# Patient Record
Sex: Male | Born: 1960
Health system: Southern US, Community
[De-identification: ages and names within clinical notes are randomized; demographics above are authoritative.]

## PROBLEM LIST (undated history)

## (undated) DIAGNOSIS — M1990 Unspecified osteoarthritis, unspecified site: Secondary | ICD-10-CM

## (undated) DIAGNOSIS — N400 Enlarged prostate without lower urinary tract symptoms: Secondary | ICD-10-CM

## (undated) DIAGNOSIS — I10 Essential (primary) hypertension: Secondary | ICD-10-CM

## (undated) DIAGNOSIS — K589 Irritable bowel syndrome without diarrhea: Secondary | ICD-10-CM

## (undated) DIAGNOSIS — R7303 Prediabetes: Secondary | ICD-10-CM

## (undated) DIAGNOSIS — D649 Anemia, unspecified: Secondary | ICD-10-CM

## (undated) DIAGNOSIS — K219 Gastro-esophageal reflux disease without esophagitis: Secondary | ICD-10-CM

## (undated) DIAGNOSIS — I2510 Atherosclerotic heart disease of native coronary artery without angina pectoris: Secondary | ICD-10-CM

## (undated) DIAGNOSIS — L309 Dermatitis, unspecified: Secondary | ICD-10-CM

## (undated) DIAGNOSIS — E785 Hyperlipidemia, unspecified: Secondary | ICD-10-CM

## (undated) DIAGNOSIS — A048 Other specified bacterial intestinal infections: Secondary | ICD-10-CM

## (undated) DIAGNOSIS — G4730 Sleep apnea, unspecified: Secondary | ICD-10-CM

## (undated) DIAGNOSIS — I209 Angina pectoris, unspecified: Secondary | ICD-10-CM

## (undated) HISTORY — DX: Essential (primary) hypertension: I10

## (undated) HISTORY — DX: Dermatitis, unspecified: L30.9

## (undated) HISTORY — DX: Irritable bowel syndrome, unspecified: K58.9

## (undated) HISTORY — DX: Sleep apnea, unspecified: G47.30

## (undated) HISTORY — DX: Hyperlipidemia, unspecified: E78.5

## (undated) HISTORY — PX: WISDOM TOOTH EXTRACTION: SHX21

## (undated) HISTORY — DX: Benign prostatic hyperplasia without lower urinary tract symptoms: N40.0

## (undated) HISTORY — DX: Anemia, unspecified: D64.9

## (undated) HISTORY — DX: Other specified bacterial intestinal infections: A04.8

## (undated) HISTORY — DX: Gastro-esophageal reflux disease without esophagitis: K21.9

## (undated) HISTORY — PX: CARDIAC CATHETERIZATION: SHX172

---

## 1998-07-09 ENCOUNTER — Encounter: Payer: Self-pay | Admitting: Emergency Medicine

## 1998-07-09 ENCOUNTER — Emergency Department (HOSPITAL_COMMUNITY): Admission: EM | Admit: 1998-07-09 | Discharge: 1998-07-09 | Payer: Self-pay | Admitting: Emergency Medicine

## 2000-10-31 ENCOUNTER — Encounter: Payer: Self-pay | Admitting: Family Medicine

## 2000-10-31 ENCOUNTER — Ambulatory Visit (HOSPITAL_COMMUNITY): Admission: RE | Admit: 2000-10-31 | Discharge: 2000-10-31 | Payer: Self-pay | Admitting: Family Medicine

## 2001-05-29 ENCOUNTER — Encounter: Payer: Self-pay | Admitting: Internal Medicine

## 2001-05-29 ENCOUNTER — Ambulatory Visit (HOSPITAL_COMMUNITY): Admission: RE | Admit: 2001-05-29 | Discharge: 2001-05-29 | Payer: Self-pay | Admitting: Internal Medicine

## 2001-12-05 ENCOUNTER — Emergency Department (HOSPITAL_COMMUNITY): Admission: EM | Admit: 2001-12-05 | Discharge: 2001-12-05 | Payer: Self-pay | Admitting: *Deleted

## 2003-08-28 HISTORY — PX: CHOLECYSTECTOMY: SHX55

## 2005-01-23 ENCOUNTER — Emergency Department (HOSPITAL_COMMUNITY): Admission: EM | Admit: 2005-01-23 | Discharge: 2005-01-24 | Payer: Self-pay | Admitting: Emergency Medicine

## 2005-01-24 ENCOUNTER — Ambulatory Visit (HOSPITAL_COMMUNITY): Admission: RE | Admit: 2005-01-24 | Discharge: 2005-01-24 | Payer: Self-pay | Admitting: Emergency Medicine

## 2005-01-26 ENCOUNTER — Ambulatory Visit: Payer: Self-pay | Admitting: Nurse Practitioner

## 2005-01-29 ENCOUNTER — Ambulatory Visit: Payer: Self-pay | Admitting: *Deleted

## 2005-01-31 ENCOUNTER — Ambulatory Visit (HOSPITAL_COMMUNITY): Admission: RE | Admit: 2005-01-31 | Discharge: 2005-01-31 | Payer: Self-pay

## 2005-01-31 ENCOUNTER — Encounter (INDEPENDENT_AMBULATORY_CARE_PROVIDER_SITE_OTHER): Payer: Self-pay | Admitting: *Deleted

## 2005-02-12 ENCOUNTER — Ambulatory Visit: Payer: Self-pay | Admitting: Family Medicine

## 2005-02-12 ENCOUNTER — Ambulatory Visit: Payer: Self-pay | Admitting: *Deleted

## 2006-11-03 ENCOUNTER — Emergency Department (HOSPITAL_COMMUNITY): Admission: EM | Admit: 2006-11-03 | Discharge: 2006-11-03 | Payer: Self-pay | Admitting: Emergency Medicine

## 2006-11-08 ENCOUNTER — Ambulatory Visit: Payer: Self-pay | Admitting: Nurse Practitioner

## 2006-11-08 ENCOUNTER — Ambulatory Visit (HOSPITAL_COMMUNITY): Admission: RE | Admit: 2006-11-08 | Discharge: 2006-11-08 | Payer: Self-pay | Admitting: Nurse Practitioner

## 2006-11-12 ENCOUNTER — Ambulatory Visit: Payer: Self-pay | Admitting: Nurse Practitioner

## 2006-11-19 ENCOUNTER — Encounter: Admission: RE | Admit: 2006-11-19 | Discharge: 2006-11-19 | Payer: Self-pay | Admitting: Orthopaedic Surgery

## 2007-05-14 ENCOUNTER — Encounter (INDEPENDENT_AMBULATORY_CARE_PROVIDER_SITE_OTHER): Payer: Self-pay | Admitting: *Deleted

## 2010-04-05 ENCOUNTER — Ambulatory Visit: Payer: Self-pay | Admitting: Internal Medicine

## 2010-04-05 LAB — CONVERTED CEMR LAB
BUN: 20 mg/dL (ref 6–23)
CO2: 23 meq/L (ref 19–32)
Calcium: 9.7 mg/dL (ref 8.4–10.5)
Chloride: 103 meq/L (ref 96–112)
Cholesterol: 179 mg/dL (ref 0–200)
Creatinine, Ser: 1.03 mg/dL (ref 0.40–1.50)
Glucose, Bld: 88 mg/dL (ref 70–99)
Total Bilirubin: 0.5 mg/dL (ref 0.3–1.2)
Total CHOL/HDL Ratio: 4.8
Triglycerides: 72 mg/dL (ref <150)
VLDL: 14 mg/dL (ref 0–40)

## 2010-07-01 ENCOUNTER — Emergency Department (HOSPITAL_COMMUNITY): Admission: EM | Admit: 2010-07-01 | Discharge: 2010-07-01 | Payer: Self-pay | Admitting: Family Medicine

## 2010-07-26 ENCOUNTER — Encounter (INDEPENDENT_AMBULATORY_CARE_PROVIDER_SITE_OTHER): Payer: Self-pay | Admitting: *Deleted

## 2010-08-27 HISTORY — PX: COLONOSCOPY: SHX174

## 2010-09-01 LAB — HM COLONOSCOPY

## 2010-09-05 ENCOUNTER — Encounter (INDEPENDENT_AMBULATORY_CARE_PROVIDER_SITE_OTHER): Payer: Self-pay | Admitting: *Deleted

## 2010-09-05 ENCOUNTER — Other Ambulatory Visit: Payer: Self-pay | Admitting: Gastroenterology

## 2010-09-05 ENCOUNTER — Ambulatory Visit
Admission: RE | Admit: 2010-09-05 | Discharge: 2010-09-05 | Payer: Self-pay | Source: Home / Self Care | Attending: Gastroenterology | Admitting: Gastroenterology

## 2010-09-05 DIAGNOSIS — K3189 Other diseases of stomach and duodenum: Secondary | ICD-10-CM | POA: Insufficient documentation

## 2010-09-05 DIAGNOSIS — R1013 Epigastric pain: Secondary | ICD-10-CM

## 2010-09-05 DIAGNOSIS — R197 Diarrhea, unspecified: Secondary | ICD-10-CM | POA: Insufficient documentation

## 2010-09-05 LAB — COMPREHENSIVE METABOLIC PANEL
ALT: 45 U/L (ref 0–53)
AST: 32 U/L (ref 0–37)
Albumin: 4 g/dL (ref 3.5–5.2)
Alkaline Phosphatase: 68 U/L (ref 39–117)
BUN: 16 mg/dL (ref 6–23)
CO2: 31 mEq/L (ref 19–32)
Calcium: 9.9 mg/dL (ref 8.4–10.5)
Chloride: 104 mEq/L (ref 96–112)
Creatinine, Ser: 1 mg/dL (ref 0.4–1.5)
GFR: 86.06 mL/min (ref >60.00)
Glucose, Bld: 69 mg/dL — ABNORMAL LOW (ref 70–99)
Potassium: 4.5 mEq/L (ref 3.5–5.1)
Sodium: 140 mEq/L (ref 135–145)
Total Bilirubin: 0.6 mg/dL (ref 0.3–1.2)
Total Protein: 8.1 g/dL (ref 6.0–8.3)

## 2010-09-05 LAB — CBC WITH DIFFERENTIAL/PLATELET
Basophils Absolute: 0 10*3/uL (ref 0.0–0.1)
Basophils Relative: 0.6 % (ref 0.0–3.0)
Eosinophils Absolute: 0.3 10*3/uL (ref 0.0–0.7)
Eosinophils Relative: 4.1 % (ref 0.0–5.0)
HCT: 43.2 % (ref 39.0–52.0)
Hemoglobin: 14.4 g/dL (ref 13.0–17.0)
Lymphocytes Relative: 44.9 % (ref 12.0–46.0)
Lymphs Abs: 2.8 10*3/uL (ref 0.7–4.0)
MCHC: 33.5 g/dL (ref 30.0–36.0)
MCV: 84.7 fl (ref 78.0–100.0)
Monocytes Absolute: 0.7 10*3/uL (ref 0.1–1.0)
Monocytes Relative: 11.8 % (ref 3.0–12.0)
Neutro Abs: 2.4 10*3/uL (ref 1.4–7.7)
Neutrophils Relative %: 38.6 % — ABNORMAL LOW (ref 43.0–77.0)
Platelets: 157 10*3/uL (ref 150.0–400.0)
RBC: 5.1 Mil/uL (ref 4.22–5.81)
RDW: 15 % — ABNORMAL HIGH (ref 11.5–14.6)
WBC: 6.3 10*3/uL (ref 4.5–10.5)

## 2010-09-05 LAB — SEDIMENTATION RATE: Sed Rate: 16 mm/hr (ref 0–22)

## 2010-09-06 ENCOUNTER — Encounter: Payer: Self-pay | Admitting: Gastroenterology

## 2010-09-07 ENCOUNTER — Encounter: Payer: Self-pay | Admitting: Gastroenterology

## 2010-09-11 ENCOUNTER — Other Ambulatory Visit: Payer: Self-pay | Admitting: Gastroenterology

## 2010-09-11 ENCOUNTER — Ambulatory Visit
Admission: RE | Admit: 2010-09-11 | Discharge: 2010-09-11 | Payer: Self-pay | Source: Home / Self Care | Attending: Gastroenterology | Admitting: Gastroenterology

## 2010-09-11 ENCOUNTER — Encounter: Payer: Self-pay | Admitting: Gastroenterology

## 2010-09-17 ENCOUNTER — Encounter: Payer: Self-pay | Admitting: Orthopaedic Surgery

## 2010-09-26 NOTE — Letter (Signed)
Summary: New Patient letter  [facility]  [address] [place], [place] [address]   Phone: [phone]  Fax: [phone]       07/26/2010 MRN: [id number]  [facility] [address] [place], [place]  [address]  Dear Eugene Mccullough,  Welcome to the Gastroenterology Division at [facility]    You are scheduled to see Dr. Christella Hartigan on 09/05/2010 at 3:00PM on the 3rd floor at [facility], [address]  We ask that you try to arrive at our office 15 minutes prior to your appointment time to allow for check-in.  We would like you to complete the enclosed self-administered evaluation form prior to your visit and bring it with you on the day of your appointment.  We will review it with you.  Also, please bring a complete list of all your medications or, if you prefer, bring the medication bottles and we will list them.  Please bring your insurance card so that we may make a copy of it.  If your insurance requires a referral to see a specialist, please bring your referral form from your primary care physician.  Co-payments are due at the time of your visit and may be paid by cash, check or credit card.     Your office visit will consist of a consult with your physician (includes a physical exam), any laboratory testing he/she may order, scheduling of any necessary diagnostic testing (e.g. x-ray, ultrasound, CT-scan), and scheduling of a procedure (e.g. Endoscopy, Colonoscopy) if required.  Please allow enough time on your schedule to allow for any/all of these possibilities.    If you cannot keep your appointment, please call [phone] to cancel or reschedule prior to your appointment date.  This allows [place] the opportunity to schedule an appointment for another patient in need of care.  If you do not cancel or reschedule by 5 p.m. the business day prior to your appointment date, you will be charged a $50.00 late cancellation/no-show fee.    Thank you for  choosing [facility] for your medical needs.  We appreciate the opportunity to care for you.  Please visit [place] at our website  to learn more about our practice.                     Sincerely,                                                             The Gastroenterology Division

## 2010-09-28 NOTE — Miscellaneous (Signed)
Summary: rx  Clinical Lists Changes  Medications: Removed medication of MOVIPREP 100 GM  SOLR (PEG-KCL-NACL-NASULF-NA ASC-C) As per prep instructions. Added new medication of METRONIDAZOLE 250 MG  TABS (METRONIDAZOLE) take one pill three times a day - Signed Rx of METRONIDAZOLE 250 MG  TABS (METRONIDAZOLE) take one pill three times a day;  #21 x 0;  Signed;  Entered by: Rachael Fee MD;  Authorized by: Rachael Fee MD;  Method used: Print then Give to Patient    Prescriptions: METRONIDAZOLE 250 MG  TABS (METRONIDAZOLE) take one pill three times a day  #21 x 0   Entered and Authorized by:   Rachael Fee MD   Signed by:   Rachael Fee MD on 09/11/2010   Method used:   Print then Give to Patient   RxID:   [id number]

## 2010-09-28 NOTE — Letter (Signed)
Summary: [facility] Instructions  [facility]  [address] [place], [place] [address]   Phone: [phone]  Fax: [phone]       Eugene Mccullough    1974/07/31    MRN: [id number]        Procedure Day /Date:09/11/10 MON     Arrival Time:1 pm     Procedure Time:2 pm     Location of Procedure:                    X  [facility] (4th Floor)                        PREPARATION FOR COLONOSCOPY WITH MOVIPREP   Starting 5 days prior to your procedure 09/06/10 do not eat nuts, seeds, popcorn, corn, beans, peas,  salads, or any raw vegetables.  Do not take any fiber supplements (e.g. Metamucil, Citrucel, and Benefiber).  THE DAY BEFORE YOUR PROCEDURE         DATE: 09/10/10 DAY: SUN  1.  Drink clear liquids the entire day-NO SOLID FOOD  2.  Do not drink anything colored red or purple.  Avoid juices with pulp.  No orange juice.  3.  Drink at least 64 oz. (8 glasses) of fluid/clear liquids during the day to prevent dehydration and help the prep work efficiently.  CLEAR LIQUIDS INCLUDE: Water Jello Ice Popsicles Tea (sugar ok, no milk/cream) Powdered fruit flavored drinks Coffee (sugar ok, no milk/cream) Gatorade Juice: apple, white grape, white cranberry  Lemonade Clear bullion, consomm, broth Carbonated beverages (any kind) Strained chicken noodle soup Hard Candy                             4.  In the morning, mix first dose of MoviPrep solution:    Empty 1 Pouch A and 1 Pouch B into the disposable container    Add lukewarm drinking water to the top line of the container. Mix to dissolve    Refrigerate (mixed solution should be used within 24 hrs)  5.  Begin drinking the prep at 5:00 p.m. The MoviPrep container is divided by 4 marks.   Every 15 minutes drink the solution down to the next mark (approximately 8 oz) until the full liter is complete.   6.  Follow completed prep with 16 oz of clear liquid of your choice (Nothing red or purple).   Continue to drink clear liquids until bedtime.  7.  Before going to bed, mix second dose of MoviPrep solution:    Empty 1 Pouch A and 1 Pouch B into the disposable container    Add lukewarm drinking water to the top line of the container. Mix to dissolve    Refrigerate  THE DAY OF YOUR PROCEDURE      DATE: 09/11/10 DAY: MON  Beginning at 9 a.m. (5 hours before procedure):         1. Every 15 minutes, drink the solution down to the next mark (approx 8 oz) until the full liter is complete.  2. Follow completed prep with 16 oz. of clear liquid of your choice.    3. You may drink clear liquids until 12 noon (2 HOURS BEFORE PROCEDURE).   MEDICATION INSTRUCTIONS  Unless otherwise instructed, you should take regular prescription medications with a small sip of water   as early as possible the morning of your procedure.  OTHER INSTRUCTIONS  You will need a responsible adult at least 50 years of age to accompany you and drive you home.   This person must remain in the waiting room during your procedure.  Wear loose fitting clothing that is easily removed.  Leave jewelry and other valuables at home.  However, you may wish to bring a book to read or  an iPod/MP3 player to listen to music as you wait for your procedure to start.  Remove all body piercing jewelry and leave at home.  Total time from sign-in until discharge is approximately 2-3 hours.  You should go home directly after your procedure and rest.  You can resume normal activities the  day after your procedure.  The day of your procedure you should not:   Drive   Make legal decisions   Operate machinery   Drink alcohol   Return to work  You will receive specific instructions about eating, activities and medications before you leave.    The above instructions have been reviewed and explained to me by   _______________________    I fully understand and can verbalize these instructions  _____________________________ Date _________

## 2010-09-28 NOTE — Procedures (Addendum)
Summary: Upper Endoscopy  Patient: Eugene Mccullough Note: All result statuses are Final unless otherwise noted.  Tests: (1) Upper Endoscopy (EGD)   EGD Upper Endoscopy       DONE     [facility]     [address]     [place], [place]  [address]           ENDOSCOPY PROCEDURE REPORT           PATIENT:  [name], [name]  MR#:  [id number]     BIRTHDATE:  11-16-1960, 49 yrs. old  GENDER:  male     ENDOSCOPIST:  Rachael Fee, MD     PROCEDURE DATE:  09/11/2010     PROCEDURE:  EGD with biopsy, [address]     ASA CLASS:  Class II     INDICATIONS:  abdominal pain, previous PUD     MEDICATIONS:  There was residual sedation effect present from     prior procedure.     TOPICAL ANESTHETIC:  none           DESCRIPTION OF PROCEDURE:   After the risks benefits and     alternatives of the procedure were thoroughly explained, informed     consent was obtained.  The LB GIF-H180 [id number] endoscope was     introduced through the mouth and advanced to the second portion of     the duodenum, without limitations.  The instrument was slowly     withdrawn as the mucosa was fully examined.     <<PROCEDUREIMAGES>>     There was mild, non-specific gastritis. Biopsies taken (pathology     jar 1) (see image2).  A hiatal hernia was found. This was 3-4cm     (see image5).  Otherwise the examination was normal (see image4,     image3, and image1).    Retroflexed views revealed no     abnormalities.    The scope was then withdrawn from the patient     and the procedure completed.     COMPLICATIONS:  None           ENDOSCOPIC IMPRESSION:     1) Mild gastritis, biopsied to check for H. pylori     2) Hiatal hernia     3) Otherwise normal examination           RECOMMENDATIONS:     Await biopsy results.  If biopsies confirm H. pylori he will be     started on appropriate antibiotics.           ______________________________     Rachael Fee, MD           n.     eSIGNED:   Rachael Fee at  09/11/2010 02:27 PM           Jake Seats, [id number]  Note: An exclamation mark (!) indicates a result that was not dispersed into the flowsheet. Document Creation Date: 09/11/2010 2:28 PM _______________________________________________________________________  (1) Order result status: Final Collection or observation date-time: 09/11/2010 14:21 Requested date-time:  Receipt date-time:  Reported date-time:  Referring Physician:   Ordering Physician: Rob Bunting [phone]) Specimen Source:  Source: Launa Grill Order Number: [id number] Lab site:

## 2010-09-28 NOTE — Assessment & Plan Note (Signed)
History of Present Illness Visit Type: Initial Visit Primary GI MD: Rob Bunting MD Primary Provider: [facility] Chief Complaint: Epigastric Pain and GERD History of Present Illness:     very pleasant 50 year old man who was born in [place].  he believes he has another ulcer.  he thinks he has IBS.   He had ulcers in UGI tract 5-6 years ago (he had pain in epigasgtrium, radiating to back).  EGD in [place] found ulcers (gastric and duodenum), was told it was H.pylori.  Was put on repeat Abx 3 months ago.   he still has pain in epigstrium, improved after he eats.  No NSAIDs.  He has gained 20 pounds in past year.  He has lower abd pain, diarrhea (6-7 times a day).  Non-bloody.  Was told he had IBS in [place] 25 years ago.   No nocturnal diarrhea.    Normally he has 2 bms a day, solid.    He has not had stool tested.           Current Medications (verified): 1)  Allopurinol 300 Mg Tabs (Allopurinol) .Marland Kitchen.. 1 By Mouth Once Daily 2)  Omeprazole 40 Mg Cpdr (Omeprazole) .Marland Kitchen.. 1 By Mouth Once Daily 3)  Tranquilizer/anticholinergic .Marland Kitchen.. 1 Three Times A Day  Allergies (verified): 1)  ! Vicodin  Past History:  Past Medical History: Gastric Ulcer  H pylori  GERD  Irritable Bowel Syndrome    Past Surgical History: Cholecystectomy 2005  Family History: no colon cancer  Social History: he is married, he has 3 children, he is a Insurance claims handler, he does not smoke cigarettes or drink alcohol, he drinks 2-4 caffeinated beverages a day  Review of Systems       Pertinent positive and negative review of systems were noted in the above HPI and GI specific review of systems.  All other review of systems was otherwise negative.   Vital Signs:  Patient profile:   50 year old male Height:      66 inches Weight:      211 pounds BMI:     34.18 BSA:     2.05 Pulse rate:   80 / minute Pulse rhythm:   regular BP sitting:   102 / 80  (right arm)  Vitals Entered By: Merri Ray  CMA Duncan Dull) (September 05, 2010 2:31 PM)  Physical Exam  Additional Exam:  Constitutional: generally well appearing Psychiatric: alert and oriented times 3 Eyes: extraocular movements intact Mouth: oropharynx moist, no lesions Neck: supple, no lymphadenopathy Cardiovascular: heart regular rate and rythm Lungs: CTA bilaterally Abdomen: soft, non-tender, non-distended, no obvious ascites, no peritoneal signs, normal bowel sounds Extremities: no lower extremity edema bilaterally Skin: no lesions on visible extremities    Impression & Recommendations:  Problem # 1:  History of ulcers, continued epigastric discomfort he is pretty clear that he has had H. pylori ulcers in the past. He has the same symptoms again. empiric antibiotics have not helped.  we will proceed with endoscopy with biopsies if indicated.  Problem # 2:  chronic diarrhea we will proceed with colonoscopy at his soonest convenience, we'll plan to look in his terminal ileum and also do random biopsies of his colon if everything looks normal.  he will get a basic set of labs including a CBC, complete metabolic profile, stool testing.  Other Orders: Colon/Endo (Colon/Endo) TLB-CBC Platelet - w/Differential (85025-CBCD) TLB-CMP (Comprehensive Metabolic Pnl) (80053-COMP) TLB-Sedimentation Rate (ESR) (85652-ESR) T-C diff by PCR (09811) T-Fecal WBC (91478-29562) T-Stool  for O&P (62130-86578)  Patient Instructions: 1)  You will be scheduled to have an upper endoscopy. 2)  You will be scheduled to have a colonoscopy. 3)  You will get lab test(s) done today (cbc, cmet, esr, stool for c. diff by PCR, fecal leuks, ova and parasites). 4)  A copy of this information will be sent to Dr. Andrey Campanile. 5)  The medication list was reviewed and reconciled.  All changed / newly prescribed medications were explained.  A complete medication list was provided to the patient / caregiver. Prescriptions: MOVIPREP 100 GM  SOLR (PEG-KCL-NACL-NASULF-NA  ASC-C) As per prep instructions.  #1 x 0   Entered by:   Chales Abrahams CMA (AAMA)   Authorized by:   Rachael Fee MD   Signed by:   Rachael Fee MD on 09/05/2010   Method used:   Electronically to        [facility]  [id number] (retail)       [address]       [place], [place]  [address]       Ph: [phone] or [phone]       Fax: [phone]   RxID:   [id number]

## 2010-09-28 NOTE — Procedures (Addendum)
Summary: Colonoscopy  Patient: [name] Note: All result statuses are Final unless otherwise noted.  Tests: (1) Colonoscopy (COL)   COL Colonoscopy           DONE     [facility]     [address]     [place], [place]  [address]           COLONOSCOPY PROCEDURE REPORT           PATIENT:  Eugene, Mccullough  MR#:  [id number]     BIRTHDATE:  11-28-1960, 49 yrs. old  GENDER:  male     ENDOSCOPIST:  Rachael Fee, MD     PROCEDURE DATE:  09/11/2010     PROCEDURE:  Colonoscopy with biopsy     ASA CLASS:  Class II     INDICATIONS:  chronic loose stools     MEDICATIONS:   Fentanyl 100 mcg IV, Versed 10 mg IV           DESCRIPTION OF PROCEDURE:   After the risks benefits and     alternatives of the procedure were thoroughly explained, informed     consent was obtained.  Digital rectal exam was performed and     revealed no rectal masses.   The LB [id number]-Q180AL [id number] endoscope     was introduced through the anus and advanced to the terminal ileum     which was intubated for a short distance, without limitations.     The quality of the prep was adequate, using MoviPrep.  The     instrument was then slowly withdrawn as the colon was fully     examined.     <<PROCEDUREIMAGES>>         FINDINGS:  There were significant     diverticular changes in left colon (multiple diverticulum, the     mucosa in this region was edematous, very tortuous) (see image7     and image6).  The terminal ileum appeared normal (see image4).     External hemorrhoids were found.  This was otherwise a normal     examination of the colon that was biopsied randomly (pathology jar     1) (see image3 and image8).   Retroflexed views in the rectum     revealed no abnormalities.    The scope was then withdrawn from     the patient and the procedur     e completed.     COMPLICATIONS:  None     ENDOSCOPIC IMPRESSION:     1) Significant left sided diverticular changes (edematouos,     tortuous lumen might  contribute to loose stools)     2) Normal terminal ileum     3) External hemorrhoids     4) Otherwise normal examination; random colon biopsies taken to     check for microscopic colitis.           RECOMMENDATIONS:     His ova/parasites showed an atypical parasite (endolimax nana)     that MAY be pathogenic. He will start flagyl 250, three times     daily for one week.     Await biopsies for further recommendations.           ______________________________     Rachael Fee, MD           n.     eSIGNED:   Rachael Fee at 09/11/2010 02:17 PM  Eugene Mccullough, Eugene Mccullough, [id number]  Note: An exclamation mark (!) indicates a result that was not dispersed into the flowsheet. Document Creation Date: 09/11/2010 2:18 PM _______________________________________________________________________  (1) Order result status: Final Collection or observation date-time: 09/11/2010 14:11 Requested date-time:  Receipt date-time:  Reported date-time:  Referring Physician:   Ordering Physician: Rob Bunting [phone]) Specimen Source:  Source: Launa Grill Order Number: [id number] Lab site:

## 2010-10-24 ENCOUNTER — Encounter (INDEPENDENT_AMBULATORY_CARE_PROVIDER_SITE_OTHER): Payer: Self-pay | Admitting: *Deleted

## 2010-11-02 NOTE — Assessment & Plan Note (Signed)
Summary: Patient responding to drug study advertisement.  Patient called [employer] regarding advertisement that he saw for [place] IBS-D study.  I reviewed his colonoscopy/pathology reports and spoke with Dr. Christella Hartigan, as patient continues to have frequent diarrhea.  Dr. Christella Hartigan does not recommend him for an IBS Study due to structural abnomality but feels that patient should return for follow-up.  I called patient back and explained this to him.  He will follow up with Dr. Christella Hartigan.   Allergies: 1)  ! Vicodin

## 2010-11-07 LAB — POCT I-STAT, CHEM 8
BUN: 13 mg/dL (ref 6–23)
Creatinine, Ser: 1.3 mg/dL (ref 0.4–1.5)
Glucose, Bld: 82 mg/dL (ref 70–99)
Hemoglobin: 16 g/dL (ref 13.0–17.0)
Potassium: 4.3 mEq/L (ref 3.5–5.1)
Sodium: 141 mEq/L (ref 135–145)

## 2010-11-07 LAB — POCT URINALYSIS DIPSTICK
Ketones, ur: NEGATIVE mg/dL
Protein, ur: 100 mg/dL — AB
Specific Gravity, Urine: 1.025 (ref 1.005–1.030)
Urobilinogen, UA: 0.2 mg/dL (ref 0.0–1.0)

## 2010-11-28 ENCOUNTER — Ambulatory Visit: Payer: Self-pay | Admitting: Gastroenterology

## 2011-01-12 NOTE — Op Note (Signed)
Eugene Mccullough, Eugene Mccullough             ACCOUNT NO.:  [id number]   MEDICAL RECORD NO.:  [id number]          PATIENT TYPE:  AMB   LOCATION:  DAY                          FACILITY:  [facility]   PHYSICIAN:  Lorre Munroe., M.D.DATE OF BIRTH:  [birth year]   DATE OF PROCEDURE:  01/31/2005  DATE OF DISCHARGE:  01/31/2005                                 OPERATIVE REPORT   PREOPERATIVE DIAGNOSES:  Symptomatic gallstones.   POSTOPERATIVE DIAGNOSES:  Symptomatic gallstones.   OPERATION:  Laparoscopic cholecystectomy.   SURGEON:  Lebron Conners, M.D.   ANESTHESIA:  General and local.   SPECIMEN:  Gallbladder.   ESTIMATED BLOOD LOSS:  Minimal.   COMPLICATIONS:  None.   DESCRIPTION OF PROCEDURE:  After the patient was monitored and anesthetized  and had routine preparation and draping of the abdomen, I infiltrated local  anesthetic at four sites, beginning just below the umbilicus. I made a short  transverse incision there, then opened the fascia in the midline and bluntly  entered the peritoneum. After placing the #0 Vicryl pursestring suture in  the fascia and securing a Hassan cannula, I  inflated the abdomen with CO2  and performed laparoscopy. No abnormalities were noted except for slight  chronic inflammation of the gallbladder. I placed the three additional ports  under direct view without injury to the viscera occurring. I then retracted  the fundus of the gallbladder upward and the infundibulum laterally and  dissected the hepatoduodenal ligament until I clearly identified the cystic  duct emerging from the infundibulum of the gallbladder. I clipped it with  four clips and divided between the two closest to the gallbladder. I  similarly dissected out and divided the cystic artery then dissected the  gallbladder out of its fossa utilizing the cautery. After obtaining good  hemostasis, I removed the gallbladder from the body through the umbilical  incision and tied the pursestring  suture. I then irrigated the right upper  quadrant and removed the irrigant. There was no evidence of bile leak or  bleeding and the clips appeared secure. I then removed the irrigant and the  lateral ports under direct vision and allowed the CO2 to escape through the  epigastric port and then removed it. I closed all skin incisions with  intracuticular 4-0 Vicryl and Steri-Strips. The patient tolerated the  operation well.      WB/MEDQ  D:  02/11/2005  T:  02/12/2005  Job:  [id number]

## 2011-03-15 ENCOUNTER — Inpatient Hospital Stay (INDEPENDENT_AMBULATORY_CARE_PROVIDER_SITE_OTHER)
Admission: RE | Admit: 2011-03-15 | Discharge: 2011-03-15 | Disposition: A | Discharge status: Home / Self Care | Payer: Self-pay | Source: Ambulatory Visit | Attending: Emergency Medicine | Admitting: Emergency Medicine

## 2011-03-15 DIAGNOSIS — M6281 Muscle weakness (generalized): Secondary | ICD-10-CM

## 2011-03-15 DIAGNOSIS — R5381 Other malaise: Secondary | ICD-10-CM

## 2011-03-15 LAB — POCT I-STAT, CHEM 8
Calcium, Ion: 1.24 mmol/L (ref 1.12–1.32)
Chloride: 104 mEq/L (ref 96–112)
Creatinine, Ser: 1.2 mg/dL (ref 0.50–1.35)
Glucose, Bld: 93 mg/dL (ref 70–99)
HCT: 44 % (ref 39.0–52.0)
Potassium: 4.1 mEq/L (ref 3.5–5.1)

## 2011-07-28 ENCOUNTER — Emergency Department (HOSPITAL_COMMUNITY)
Admission: EM | Admit: 2011-07-28 | Discharge: 2011-07-28 | Disposition: A | Discharge status: Nursing Home (Includes ICF) | Payer: Self-pay | Source: Home / Self Care | Attending: Emergency Medicine | Admitting: Emergency Medicine

## 2011-07-28 ENCOUNTER — Encounter (HOSPITAL_COMMUNITY): Payer: Self-pay | Admitting: *Deleted

## 2011-07-28 ENCOUNTER — Emergency Department (HOSPITAL_COMMUNITY)
Admission: EM | Admit: 2011-07-28 | Discharge: 2011-07-28 | Disposition: A | Discharge status: Home / Self Care | Payer: Self-pay | Attending: Emergency Medicine | Admitting: Emergency Medicine

## 2011-07-28 ENCOUNTER — Other Ambulatory Visit: Payer: Self-pay

## 2011-07-28 DIAGNOSIS — K589 Irritable bowel syndrome without diarrhea: Secondary | ICD-10-CM | POA: Insufficient documentation

## 2011-07-28 DIAGNOSIS — Z862 Personal history of diseases of the blood and blood-forming organs and certain disorders involving the immune mechanism: Secondary | ICD-10-CM | POA: Insufficient documentation

## 2011-07-28 DIAGNOSIS — R002 Palpitations: Secondary | ICD-10-CM

## 2011-07-28 DIAGNOSIS — Z79899 Other long term (current) drug therapy: Secondary | ICD-10-CM | POA: Insufficient documentation

## 2011-07-28 DIAGNOSIS — R030 Elevated blood-pressure reading, without diagnosis of hypertension: Secondary | ICD-10-CM | POA: Insufficient documentation

## 2011-07-28 DIAGNOSIS — Z8639 Personal history of other endocrine, nutritional and metabolic disease: Secondary | ICD-10-CM | POA: Insufficient documentation

## 2011-07-28 DIAGNOSIS — K219 Gastro-esophageal reflux disease without esophagitis: Secondary | ICD-10-CM | POA: Insufficient documentation

## 2011-07-28 DIAGNOSIS — I10 Essential (primary) hypertension: Secondary | ICD-10-CM

## 2011-07-28 LAB — POCT I-STAT, CHEM 8
BUN: 16 mg/dL (ref 6–23)
Calcium, Ion: 1.24 mmol/L (ref 1.12–1.32)
Chloride: 104 mEq/L (ref 96–112)
Creatinine, Ser: 1.1 mg/dL (ref 0.50–1.35)
Glucose, Bld: 74 mg/dL (ref 70–99)
Potassium: 4 mEq/L (ref 3.5–5.1)

## 2011-07-28 MED ORDER — HYDROCHLOROTHIAZIDE 25 MG PO TABS: 25.0000 mg | ORAL_TABLET | Freq: Every day | ORAL | Status: DC

## 2011-07-28 NOTE — ED Provider Notes (Signed)
History     CSN: [id number] Arrival date & time: 07/28/2011  9:25 AM   First MD Initiated Contact with Patient 07/28/11 0920      Chief Complaint  Patient presents with  . Tachycardia    per pt felt heart racing waking during night onset x 2 - 3 weeks - happening more often at night and during day - monitoring pulse and bp at home has documented starting on 11/15 - pt has appt [facility] - denies cp     HPI Comments: Pt with 2 weeks of intermittent episodes of palpitations lasting approx 30 min and resolving. States over past week is having nonradiating chest discomfort described as "pressure" with episodes. Occur with exertion and at rest. Reports feeling like he's "about to pass out" but denies syncope. Denies excess caffeine, cocaine, stimulant use. Also reports unintentional weight gain and cold intolerance over past month. Pt has kept a log of BP and HR when he is sxatic. BP slightly elevated and HR occasionally in low 100's.   Patient is a 50 y.o. male presenting with palpitations. The history is provided by the patient.  Palpitations  This is a recurrent problem. The current episode started more than 1 week ago. The problem is associated with an unknown factor. Associated symptoms include chest pain, chest pressure, irregular heartbeat and near-syncope. Pertinent negatives include no diaphoresis, no fever, no malaise/fatigue, no numbness, no exertional chest pressure, no orthopnea, no syncope, no abdominal pain, no nausea, no vomiting, no back pain, no leg pain, no weakness, no cough and no shortness of breath. He has tried nothing for the symptoms. The treatment provided no relief. Risk factors include obesity. His past medical history does not include hyperthyroidism.    Past Medical History  Diagnosis Date  . Gastric ulcer   . H. pylori infection   . GERD (gastroesophageal reflux disease)   . IBS (irritable bowel syndrome)   . Gout     Past Surgical History    Procedure Date  . Cholecystectomy 2005    History reviewed. No pertinent family history.  History  Substance Use Topics  . Smoking status: Never Smoker   . Smokeless tobacco: Not on file  . Alcohol Use: No      Review of Systems  Constitutional: Negative for fever, malaise/fatigue and diaphoresis.  Respiratory: Negative for cough and shortness of breath.   Cardiovascular: Positive for chest pain, palpitations and near-syncope. Negative for orthopnea and syncope.  Gastrointestinal: Negative for nausea, vomiting and abdominal pain.  Musculoskeletal: Negative for back pain.  Skin: Negative for rash.  Neurological: Negative for weakness and numbness.    Allergies  Hydrocodone-acetaminophen  Home Medications   Current Outpatient Rx  Name Route Sig Dispense Refill  . ALLOPURINOL 300 MG PO TABS Oral Take 300 mg by mouth daily.      Marland Kitchen PREVPAC PO Oral Take by mouth. As directed     . NON FORMULARY  Tranquilizer/anticholinergic 1 three times daily     . OMEPRAZOLE 40 MG PO CPDR Oral Take 40 mg by mouth daily.        BP 146/106  Pulse 80  Temp(Src) 98.5 F (36.9 C) (Oral)  Resp 18  SpO2 100%  Physical Exam  Nursing note and vitals reviewed. Constitutional: He is oriented to person, place, and time. He appears well-developed and well-nourished. No distress.  HENT:  Head: Normocephalic and atraumatic.  Eyes: Conjunctivae and EOM are normal. Pupils are equal, round, and reactive  to light.  Neck: Normal range of motion. Neck supple.  Cardiovascular: Normal rate, regular rhythm, normal heart sounds and intact distal pulses.   Pulmonary/Chest: Effort normal and breath sounds normal. No respiratory distress.  Abdominal: Soft. He exhibits no distension and no mass. There is no tenderness. There is no rebound.  Musculoskeletal: Normal range of motion.  Neurological: He is alert and oriented to person, place, and time.  Skin: Skin is warm and dry.  Psychiatric: He has a  normal mood and affect. His behavior is normal.    ED Course  Procedures (including critical care time)  Labs Reviewed - No data to display No results found.   1. Heart palpitations      MDM  Pt had tsh drawn in Aug 2012 was WNL.    Date: 07/28/2011  Rate: 75   Rhythm: normal sinus rhythm  QRS Axis: normal  Intervals: normal  ST/T Wave abnormalities: nonspecific T wave changes  Conduction Disutrbances:none  Narrative Interpretation:   Old EKG Reviewed: none available  Pt older, has poor follow up. No previous cardiac workup. EKG WNL. Transferring for further evaluation.   Luiz Blare, MD 07/28/11 1041

## 2011-07-28 NOTE — ED Provider Notes (Signed)
History     CSN: [id number] Arrival date & time: 07/28/2011 11:06 AM   First MD Initiated Contact with Patient 07/28/11 1207      Chief Complaint  Patient presents with  . Palpitations    (Consider location/radiation/quality/duration/timing/severity/associated sxs/prior treatment) HPI  Patient states he has history of gout and GERD for which he takes daily medicine but no other known medical problems who denies alcohol, tobacco, and illicit drug use presents to the emergency department after being evaluated at the urgent care with complaint of palpitations they sent to ER for further evaluation. Patient states intermittent sensation of heart racing for the last month with more frequent episodes over last 2 weeks. Patient states when he has sensation of heart racing he has faint pressure-like sensation in chest that resolves as a heart racing sensation resolves. Patient states the feeling of his heart racing will last seconds to minutes and then resolved. Patient has been taking his blood pressure and his heart rate when able when he notices the heart racing it is brought a log of the last readings. Patient denies associated SOB, diaphoresis, cough, fevers, chills. Patient denies any changes in lifestyle, caffeine use, OTC energy supplements or vitamins. Symptoms were gradual onset, intermittent but increasing frequency. Has not taken any medication for symptoms PTA.   Past Medical History  Diagnosis Date  . Gastric ulcer   . H. pylori infection   . GERD (gastroesophageal reflux disease)   . IBS (irritable bowel syndrome)   . Gout     Past Surgical History  Procedure Date  . Cholecystectomy 2005    History reviewed. No pertinent family history.  History  Substance Use Topics  . Smoking status: Never Smoker   . Smokeless tobacco: Not on file  . Alcohol Use: No      Review of Systems  All other systems reviewed and are negative.    Allergies   Hydrocodone-acetaminophen  Home Medications   Current Outpatient Rx  Name Route Sig Dispense Refill  . ALLOPURINOL 300 MG PO TABS Oral Take 300 mg by mouth daily.      Marland Kitchen OMEPRAZOLE 40 MG PO CPDR Oral Take 40 mg by mouth daily.        BP 145/101  Pulse 72  Temp(Src) 97.9 F (36.6 C) (Oral)  Resp 18  SpO2 100%  Physical Exam  Nursing note and vitals reviewed. Constitutional: He is oriented to person, place, and time. He appears well-developed and well-nourished. No distress.  HENT:  Head: Normocephalic and atraumatic.  Eyes: Conjunctivae are normal.  Neck: Normal range of motion. Neck supple. No thyromegaly present.  Cardiovascular: Normal rate, regular rhythm, normal heart sounds and intact distal pulses.  Exam reveals no gallop and no friction rub.   No murmur heard. Pulmonary/Chest: Effort normal and breath sounds normal. No respiratory distress. He has no wheezes. He has no rales. He exhibits no tenderness.  Abdominal: Bowel sounds are normal. He exhibits no distension and no mass. There is no tenderness. There is no rebound and no guarding.  Musculoskeletal: Normal range of motion. He exhibits no edema and no tenderness.  Neurological: He is alert and oriented to person, place, and time.  Skin: Skin is warm and dry. No rash noted. He is not diaphoretic. No erythema.  Psychiatric: He has a normal mood and affect.    ED Course  Procedures (including critical care time)   Date: 07/28/2011  Rate: 65  Rhythm: normal sinus rhythm  QRS Axis: normal  Intervals: normal  ST/T Wave abnormalities: normal  Conduction Disutrbances:none  Narrative Interpretation:   Old EKG Reviewed: none available     Labs Reviewed  POCT I-STAT, CHEM 8  I-STAT, CHEM 8  I-STAT TROPONIN I   No results found.   1. Palpitations   2. Hypertension       MDM  Palpitations with normal EKG, istat and troponin at 0. Patient is agreeable to OP follow up for consideration of Holter monitor  but returning to ER for emergent changing or worsening of symptoms. Will start patient on anti hypertensive medication given diary of systolic pressures averaging above 140 x 2-3 weeks.         [place]ness [place], [place] 07/28/11 1350

## 2011-07-28 NOTE — ED Notes (Signed)
Pt has had fast HR for the past 2 weeks.  Pt has been measuring b/p and HR with same.  Pt's highest HR is 109.  Pt states he wakes up with fast HR.  Pt denies any other complaints.

## 2011-07-28 NOTE — ED Provider Notes (Signed)
Medical screening examination/treatment/procedure(s) were performed by non-physician practitioner and as supervising physician I was immediately available for consultation/collaboration.    Nelia Shi, MD 07/28/11 2029

## 2011-08-15 DIAGNOSIS — I10 Essential (primary) hypertension: Secondary | ICD-10-CM | POA: Insufficient documentation

## 2011-08-15 DIAGNOSIS — R002 Palpitations: Secondary | ICD-10-CM | POA: Insufficient documentation

## 2011-08-15 DIAGNOSIS — E669 Obesity, unspecified: Secondary | ICD-10-CM | POA: Insufficient documentation

## 2011-08-15 DIAGNOSIS — M109 Gout, unspecified: Secondary | ICD-10-CM | POA: Insufficient documentation

## 2012-10-21 ENCOUNTER — Encounter (HOSPITAL_COMMUNITY): Payer: Self-pay | Admitting: *Deleted

## 2012-10-21 ENCOUNTER — Emergency Department (HOSPITAL_COMMUNITY)
Admission: EM | Admit: 2012-10-21 | Discharge: 2012-10-21 | Disposition: A | Discharge status: Home / Self Care | Payer: Self-pay | Source: Home / Self Care

## 2012-10-21 DIAGNOSIS — R197 Diarrhea, unspecified: Secondary | ICD-10-CM

## 2012-10-21 DIAGNOSIS — M109 Gout, unspecified: Secondary | ICD-10-CM

## 2012-10-21 DIAGNOSIS — I10 Essential (primary) hypertension: Secondary | ICD-10-CM

## 2012-10-21 LAB — COMPREHENSIVE METABOLIC PANEL
Albumin: 4 g/dL (ref 3.5–5.2)
BUN: 14 mg/dL (ref 6–23)
Creatinine, Ser: 1 mg/dL (ref 0.50–1.35)
Total Protein: 8.5 g/dL — ABNORMAL HIGH (ref 6.0–8.3)

## 2012-10-21 MED ORDER — ALLOPURINOL 300 MG PO TABS: 300.0000 mg | ORAL_TABLET | Freq: Every day | ORAL | Status: DC

## 2012-10-21 MED ORDER — CIPROFLOXACIN HCL 500 MG PO TABS: 500.0000 mg | ORAL_TABLET | Freq: Two times a day (BID) | ORAL | Status: DC

## 2012-10-21 MED ORDER — HYDROCHLOROTHIAZIDE 25 MG PO TABS: 25.0000 mg | ORAL_TABLET | Freq: Every day | ORAL | Status: DC

## 2012-10-21 MED ORDER — METRONIDAZOLE 500 MG PO TABS: 500.0000 mg | ORAL_TABLET | Freq: Three times a day (TID) | ORAL | Status: DC

## 2012-10-21 NOTE — ED Notes (Signed)
Pt reports diarrhea twice a day for the past 3 weeks - started when he was in [place]. Pt reports that he believes that he ate something bad - denies vomiting or nausea presently

## 2012-10-21 NOTE — ED Provider Notes (Signed)
History     CSN: [id number]  Arrival date & time 10/21/12  1240   First MD Initiated Contact with Patient 10/21/12 1310      Chief Complaint  Patient presents with  . Diarrhea    (Consider location/radiation/quality/duration/timing/severity/associated sxs/prior treatment) HPI 52 year old male who recently went to [place]  and return 2 weeks back presents with 2 weeks off abdominal cramping with loose to watery diarrhea about 3 times a day . Initially had some nausea without vomiting and continued to have abdominal cramping with bloating sensation and an abscess of 3 bowel movements which were loose to watery in consistency. He did not notice any mucus or blood in it. Denied any fever, chills, body aches, joint pains, malaise, chest pain, palpitations, shortness of breath, urinary symptoms. He denied taking prophylactic antibiotics prior to visit was drinking local water as well. Denies any sick contacts   Past Medical History  Diagnosis Date  . Gastric ulcer   . H. pylori infection   . GERD (gastroesophageal reflux disease)   . IBS (irritable bowel syndrome)   . Gout     Past Surgical History  Procedure Laterality Date  . Cholecystectomy  2005    Family History  Problem Relation Age of Onset  . Family history unknown: Yes    History  Substance Use Topics  . Smoking status: Never Smoker   . Smokeless tobacco: Not on file  . Alcohol Use: No      Review of Systems  Allergies  Hydrocodone-acetaminophen  Home Medications   Current Outpatient Rx  Name  Route  Sig  Dispense  Refill  . allopurinol (ZYLOPRIM) 300 MG tablet   Oral   Take 1 tablet (300 mg total) by mouth daily.   30 tablet   3   . ciprofloxacin (CIPRO) 500 MG tablet   Oral   Take 1 tablet (500 mg total) by mouth 2 (two) times daily.   6 tablet   0   . hydrochlorothiazide (HYDRODIURIL) 25 MG tablet   Oral   Take 1 tablet (25 mg total) by mouth daily.   30 tablet   3   . metroNIDAZOLE  (FLAGYL) 500 MG tablet   Oral   Take 1 tablet (500 mg total) by mouth 3 (three) times daily.   15 tablet   0   . omeprazole (PRILOSEC) 40 MG capsule   Oral   Take 40 mg by mouth daily.             BP 141/93  Pulse 93  Temp(Src) 98.1 F (36.7 C) (Oral)  SpO2 100%  Physical Exam Middle aged male in no acute distress HEENT: No pallor, no icterus, moist oral mucosa Chest: Clear to auscultation bilaterally no added sounds Yes: Normal S1 and S2, no murmurs rub or gallop Abdomen: Soft, nontender, nondistended, bowel sounds present Extremities: Warm, no edema CNS: AAO x3 ED Course  Procedures (including critical care time)  Labs Reviewed - No data to display No results found.   Diarrhea Possible for traveler's diarrhea versus Giardiasis  in the setting of recent travel to [place] . I will check a BMP and LFTs and stool study for ova and parasite. I will prescribe him a course off oral ciprofloxacin and Flagyl to call or both for traveler's diarrhea and possible Giardiasis.   Hypertension Previously followed with health service and is on HCTZ. Request for medication refill which I will provide. Check BM P.  Chronic gout Continue with allopurinol  MDM  Eddie North, MD 10/21/12 1354

## 2012-10-22 LAB — OVA AND PARASITE EXAMINATION: Ova and parasites: NONE SEEN

## 2012-10-29 ENCOUNTER — Telehealth (HOSPITAL_COMMUNITY): Payer: Self-pay | Admitting: *Deleted

## 2012-10-29 NOTE — ED Notes (Signed)
Pt. called for lab results. I verified x 2 and told him the Stool test for O and P was neg. He said he is still having abdominal pain. I noted that [facility]. I told him to call them [phone] in AM and have them ask the doctor if he should f/u there or does he need a referral to the GI doctor.  Eugene Mccullough 10/29/2012

## 2012-12-24 ENCOUNTER — Ambulatory Visit
Admission: RE | Admit: 2012-12-24 | Discharge: 2012-12-24 | Disposition: A | Discharge status: Home / Self Care | Payer: No Typology Code available for payment source | Source: Ambulatory Visit | Attending: Physician Assistant | Admitting: Physician Assistant

## 2012-12-24 ENCOUNTER — Other Ambulatory Visit: Payer: Self-pay | Admitting: Physician Assistant

## 2012-12-24 DIAGNOSIS — M545 Low back pain, unspecified: Secondary | ICD-10-CM

## 2013-01-02 ENCOUNTER — Other Ambulatory Visit (INDEPENDENT_AMBULATORY_CARE_PROVIDER_SITE_OTHER): Payer: BC Managed Care – PPO

## 2013-01-02 ENCOUNTER — Ambulatory Visit (INDEPENDENT_AMBULATORY_CARE_PROVIDER_SITE_OTHER): Payer: BC Managed Care – PPO | Admitting: Internal Medicine

## 2013-01-02 ENCOUNTER — Encounter: Payer: Self-pay | Admitting: Internal Medicine

## 2013-01-02 VITALS — BP 118/72 | HR 108 | Temp 97.8°F | Resp 16 | Ht 66.0 in | Wt 196.0 lb

## 2013-01-02 DIAGNOSIS — M109 Gout, unspecified: Secondary | ICD-10-CM

## 2013-01-02 DIAGNOSIS — Z23 Encounter for immunization: Secondary | ICD-10-CM

## 2013-01-02 DIAGNOSIS — Z0000 Encounter for general adult medical examination without abnormal findings: Secondary | ICD-10-CM

## 2013-01-02 DIAGNOSIS — I10 Essential (primary) hypertension: Secondary | ICD-10-CM

## 2013-01-02 DIAGNOSIS — R338 Other retention of urine: Secondary | ICD-10-CM

## 2013-01-02 DIAGNOSIS — N410 Acute prostatitis: Secondary | ICD-10-CM

## 2013-01-02 DIAGNOSIS — R339 Retention of urine, unspecified: Secondary | ICD-10-CM

## 2013-01-02 DIAGNOSIS — N401 Enlarged prostate with lower urinary tract symptoms: Secondary | ICD-10-CM

## 2013-01-02 DIAGNOSIS — N138 Other obstructive and reflux uropathy: Secondary | ICD-10-CM

## 2013-01-02 LAB — COMPREHENSIVE METABOLIC PANEL
ALT: 21 U/L (ref 0–53)
AST: 20 U/L (ref 0–37)
Albumin: 4.3 g/dL (ref 3.5–5.2)
BUN: 21 mg/dL (ref 6–23)
Calcium: 10.1 mg/dL (ref 8.4–10.5)
Chloride: 102 mEq/L (ref 96–112)
Potassium: 4.9 mEq/L (ref 3.5–5.1)
Sodium: 135 mEq/L (ref 135–145)
Total Protein: 8.2 g/dL (ref 6.0–8.3)

## 2013-01-02 LAB — LIPID PANEL
Cholesterol: 184 mg/dL (ref 0–200)
LDL Cholesterol: 116 mg/dL — ABNORMAL HIGH (ref 0–99)
VLDL: 34.6 mg/dL (ref 0.0–40.0)

## 2013-01-02 LAB — URINALYSIS, ROUTINE W REFLEX MICROSCOPIC
Leukocytes, UA: NEGATIVE
Specific Gravity, Urine: >=1.03 (ref 1.000–1.030)
Urobilinogen, UA: 0.2 (ref 0.0–1.0)

## 2013-01-02 LAB — CBC WITH DIFFERENTIAL/PLATELET
Basophils Absolute: 0.1 10*3/uL (ref 0.0–0.1)
Basophils Relative: 0.8 % (ref 0.0–3.0)
Eosinophils Absolute: 0.3 10*3/uL (ref 0.0–0.7)
Lymphocytes Relative: 38.4 % (ref 12.0–46.0)
MCHC: 33.9 g/dL (ref 30.0–36.0)
Neutrophils Relative %: 46.1 % (ref 43.0–77.0)
RBC: 5.56 Mil/uL (ref 4.22–5.81)

## 2013-01-02 LAB — TSH: TSH: 0.31 u[IU]/mL — ABNORMAL LOW (ref 0.35–5.50)

## 2013-01-02 MED ORDER — SILODOSIN 8 MG PO CAPS: 8.0000 mg | ORAL_CAPSULE | Freq: Every day | ORAL | Status: DC

## 2013-01-02 MED ORDER — CIPROFLOXACIN HCL 500 MG PO TABS: 500.0000 mg | ORAL_TABLET | Freq: Two times a day (BID) | ORAL | Status: DC

## 2013-01-02 NOTE — Assessment & Plan Note (Signed)
Exam done Vaccines were updated Labs ordered Pt ed material was given 

## 2013-01-02 NOTE — Assessment & Plan Note (Signed)
I will check his PSA today He will start rapaflo for symptom relief

## 2013-01-02 NOTE — Assessment & Plan Note (Signed)
Uric acid level today 

## 2013-01-02 NOTE — Progress Notes (Signed)
Subjective:    Patient ID: Eugene Mccullough, male    DOB: [birth year], 52 y.o.   MRN: [id number]  Benign Prostatic Hypertrophy This is a recurrent problem. The current episode started more than 1 month ago. The problem has been gradually worsening since onset. Irritative symptoms include frequency and nocturia. Irritative symptoms do not include urgency. Obstructive symptoms include dribbling, incomplete emptying, an intermittent stream, a slower stream, straining and a weak stream. Associated symptoms include dysuria and hesitancy. Pertinent negatives include no chills, genital pain, hematuria, nausea or vomiting. AUA score is 8-19. He is not sexually active. Nothing aggravates the symptoms. Past treatments include nothing.      Review of Systems  Constitutional: Negative.  Negative for chills.  HENT: Negative.   Eyes: Negative.   Respiratory: Negative for cough, chest tightness, shortness of breath, wheezing and stridor.   Cardiovascular: Negative.  Negative for chest pain, palpitations and leg swelling.  Gastrointestinal: Negative.  Negative for nausea, vomiting, abdominal pain, diarrhea, constipation, blood in stool and abdominal distention.  Endocrine: Negative.   Genitourinary: Positive for dysuria, hesitancy, frequency, incomplete emptying and nocturia. Negative for urgency, hematuria, flank pain, decreased urine volume, discharge, penile swelling, scrotal swelling, enuresis, difficulty urinating, genital sores, penile pain and testicular pain.  Musculoskeletal: Negative.   Skin: Negative.   Allergic/Immunologic: Negative.   Neurological: Negative.  Negative for dizziness, tremors, weakness and light-headedness.  Hematological: Negative.  Negative for adenopathy. Does not bruise/bleed easily.  Psychiatric/Behavioral: Negative.        Objective:   Physical Exam  Vitals reviewed. Constitutional: He is oriented to person, place, and time. He appears well-developed and well-nourished.  No distress.  HENT:  Head: Normocephalic and atraumatic.  Mouth/Throat: Oropharynx is clear and moist. No oropharyngeal exudate.  Eyes: Conjunctivae are normal. Right eye exhibits no discharge. Left eye exhibits no discharge. No scleral icterus.  Neck: Normal range of motion. Neck supple. No JVD present. No tracheal deviation present. No thyromegaly present.  Cardiovascular: Normal rate, regular rhythm, normal heart sounds and intact distal pulses.  Exam reveals no gallop and no friction rub.   No murmur heard. Pulmonary/Chest: Effort normal and breath sounds normal. No stridor. No respiratory distress. He has no wheezes. He has no rales. He exhibits no tenderness.  Abdominal: Soft. Bowel sounds are normal. He exhibits no distension and no mass. There is no tenderness. There is no rebound and no guarding. Hernia confirmed negative in the right inguinal area and confirmed negative in the left inguinal area.  Genitourinary: Rectum normal, testes normal and penis normal. Rectal exam shows no external hemorrhoid, no internal hemorrhoid, no fissure, no mass, no tenderness and anal tone normal. Guaiac negative stool. Prostate is enlarged (1-2+ BPH, right lobe>left lobe) and tender. Right testis shows no mass, no swelling and no tenderness. Right testis is descended. Left testis shows no mass, no swelling and no tenderness. Left testis is descended. Circumcised. No penile erythema or penile tenderness. No discharge found.  Musculoskeletal: Normal range of motion. He exhibits no edema and no tenderness.  Lymphadenopathy:    He has no cervical adenopathy.       Right: No inguinal adenopathy present.       Left: No inguinal adenopathy present.  Neurological: He is oriented to person, place, and time.  Skin: Skin is warm and dry. No rash noted. He is not diaphoretic. No erythema. No pallor.  Psychiatric: He has a normal mood and affect. His behavior is normal. Judgment and thought content normal.  Lab  Results  Component Value Date   WBC 6.3 09/05/2010   HGB 15.0 07/28/2011   HCT 44.0 07/28/2011   PLT 157.0 09/05/2010   GLUCOSE 69* 10/21/2012   CHOL 179 04/05/2010   TRIG 72 04/05/2010   HDL 37* 04/05/2010   LDLCALC 128* 04/05/2010   ALT 24 10/21/2012   AST 22 10/21/2012   NA 139 10/21/2012   K 4.2 10/21/2012   CL 103 10/21/2012   CREATININE 1.00 10/21/2012   BUN 14 10/21/2012   CO2 27 10/21/2012   TSH 0.953 03/15/2011   HGBA1C 5.6 04/05/2010       Assessment & Plan:

## 2013-01-02 NOTE — Patient Instructions (Signed)
Health Maintenance, Males A healthy lifestyle and preventative care can promote health and wellness.  Maintain regular health, dental, and eye exams.  Eat a healthy diet. Foods like vegetables, fruits, whole grains, low-fat dairy products, and lean protein foods contain the nutrients you need without too many calories. Decrease your intake of foods high in solid fats, added sugars, and salt. Get information about a proper diet from your caregiver, if necessary.  Regular physical exercise is one of the most important things you can do for your health. Most adults should get at least 150 minutes of moderate-intensity exercise (any activity that increases your heart rate and causes you to sweat) each week. In addition, most adults need muscle-strengthening exercises on 2 or more days a week.   Maintain a healthy weight. The body mass index (BMI) is a screening tool to identify possible weight problems. It provides an estimate of body fat based on height and weight. Your caregiver can help determine your BMI, and can help you achieve or maintain a healthy weight. For adults 20 years and older:  A BMI below 18.5 is considered underweight.  A BMI of 18.5 to 24.9 is normal.  A BMI of 25 to 29.9 is considered overweight.  A BMI of 30 and above is considered obese.  Maintain normal blood lipids and cholesterol by exercising and minimizing your intake of saturated fat. Eat a balanced diet with plenty of fruits and vegetables. Blood tests for lipids and cholesterol should begin at age 20 and be repeated every 5 years. If your lipid or cholesterol levels are high, you are over 50, or you are a high risk for heart disease, you may need your cholesterol levels checked more frequently.Ongoing high lipid and cholesterol levels should be treated with medicines, if diet and exercise are not effective.  If you smoke, find out from your caregiver how to quit. If you do not use tobacco, do not start.  If you  choose to drink alcohol, do not exceed 2 drinks per day. One drink is considered to be 12 ounces (355 mL) of beer, 5 ounces (148 mL) of wine, or 1.5 ounces (44 mL) of liquor.  Avoid use of street drugs. Do not share needles with anyone. Ask for help if you need support or instructions about stopping the use of drugs.  High blood pressure causes heart disease and increases the risk of stroke. Blood pressure should be checked at least every 1 to 2 years. Ongoing high blood pressure should be treated with medicines if weight loss and exercise are not effective.  If you are 45 to 52 years old, ask your caregiver if you should take aspirin to prevent heart disease.  Diabetes screening involves taking a blood sample to check your fasting blood sugar level. This should be done once every 3 years, after age 45, if you are within normal weight and without risk factors for diabetes. Testing should be considered at a younger age or be carried out more frequently if you are overweight and have at least 1 risk factor for diabetes.  Colorectal cancer can be detected and often prevented. Most routine colorectal cancer screening begins at the age of 50 and continues through age 75. However, your caregiver may recommend screening at an earlier age if you have risk factors for colon cancer. On a yearly basis, your caregiver may provide home test kits to check for hidden blood in the stool. Use of a small camera at the end of a tube,   to directly examine the colon (sigmoidoscopy or colonoscopy), can detect the earliest forms of colorectal cancer. Talk to your caregiver about this at age 50, when routine screening begins. Direct examination of the colon should be repeated every 5 to 10 years through age 75, unless early forms of pre-cancerous polyps or small growths are found.  Hepatitis C blood testing is recommended for all people born from 1945 through 1965 and any individual with known risks for hepatitis C.  Healthy  men should no longer receive prostate-specific antigen (PSA) blood tests as part of routine cancer screening. Consult with your caregiver about prostate cancer screening.  Testicular cancer screening is not recommended for adolescents or adult males who have no symptoms. Screening includes self-exam, caregiver exam, and other screening tests. Consult with your caregiver about any symptoms you have or any concerns you have about testicular cancer.  Practice safe sex. Use condoms and avoid high-risk sexual practices to reduce the spread of sexually transmitted infections (STIs).  Use sunscreen with a sun protection factor (SPF) of 30 or greater. Apply sunscreen liberally and repeatedly throughout the day. You should seek shade when your shadow is shorter than you. Protect yourself by wearing long sleeves, pants, a wide-brimmed hat, and sunglasses year round, whenever you are outdoors.  Notify your caregiver of new moles or changes in moles, especially if there is a change in shape or color. Also notify your caregiver if a mole is larger than the size of a pencil eraser.  A one-time screening for abdominal aortic aneurysm (AAA) and surgical repair of large AAAs by sound wave imaging (ultrasonography) is recommended for ages 65 to 75 years who are current or former smokers.  Stay current with your immunizations. Document Released: 02/09/2008 Document Revised: 11/05/2011 Document Reviewed: 01/08/2011 [employer] Patient Information 2013 [employer].  

## 2013-01-02 NOTE — Assessment & Plan Note (Signed)
His BP is well controlled Today I will check his lytes and renal function 

## 2013-01-02 NOTE — Assessment & Plan Note (Signed)
Will treat with cipro 

## 2013-01-03 ENCOUNTER — Encounter: Payer: Self-pay | Admitting: Internal Medicine

## 2013-01-04 ENCOUNTER — Encounter: Payer: Self-pay | Admitting: Internal Medicine

## 2013-01-05 ENCOUNTER — Other Ambulatory Visit: Payer: Self-pay | Admitting: Internal Medicine

## 2013-01-05 DIAGNOSIS — H9313 Tinnitus, bilateral: Secondary | ICD-10-CM

## 2013-01-07 ENCOUNTER — Encounter: Payer: Self-pay | Admitting: Internal Medicine

## 2013-01-07 ENCOUNTER — Other Ambulatory Visit (INDEPENDENT_AMBULATORY_CARE_PROVIDER_SITE_OTHER): Payer: BC Managed Care – PPO

## 2013-01-07 ENCOUNTER — Ambulatory Visit (INDEPENDENT_AMBULATORY_CARE_PROVIDER_SITE_OTHER): Payer: BC Managed Care – PPO | Admitting: Internal Medicine

## 2013-01-07 VITALS — BP 116/80 | HR 79 | Temp 98.8°F | Resp 16 | Wt 195.2 lb

## 2013-01-07 DIAGNOSIS — R946 Abnormal results of thyroid function studies: Secondary | ICD-10-CM

## 2013-01-07 DIAGNOSIS — H9319 Tinnitus, unspecified ear: Secondary | ICD-10-CM

## 2013-01-07 DIAGNOSIS — I10 Essential (primary) hypertension: Secondary | ICD-10-CM

## 2013-01-07 DIAGNOSIS — R7989 Other specified abnormal findings of blood chemistry: Secondary | ICD-10-CM

## 2013-01-07 DIAGNOSIS — H9313 Tinnitus, bilateral: Secondary | ICD-10-CM

## 2013-01-07 NOTE — Patient Instructions (Signed)
Hyperthyroidism  The thyroid is a large gland located in the lower front part of your neck. The thyroid helps control metabolism. Metabolism is how your body uses food. It controls metabolism with the hormone thyroxine. When the thyroid is overactive, it produces too much hormone. When this happens, these following problems may occur:   · Nervousness  · Heat intolerance  · Weight loss (in spite of increase food intake)  · Diarrhea  · Change in hair or skin texture  · Palpitations (heart skipping or having extra beats)  · Tachycardia (rapid heart rate)  · Loss of menstruation (amenorrhea)  · Shaking of the hands  CAUSES  · Grave's Disease (the immune system attacks the thyroid gland). This is the most common cause.  · Inflammation of the thyroid gland.  · Tumor (usually benign) in the thyroid gland or elsewhere.  · Excessive use of thyroid medications (both prescription and 'natural').  · Excessive ingestion of Iodine.  DIAGNOSIS   To prove hyperthyroidism, your caregiver may do blood tests and ultrasound tests. Sometimes the signs are hidden. It may be necessary for your caregiver to watch this illness with blood tests, either before or after diagnosis and treatment.  TREATMENT  Short-term treatment  There are several treatments to control symptoms. Drugs called beta blockers may give some relief. Drugs that decrease hormone production will provide temporary relief in many people. These measures will usually not give permanent relief.  Definitive therapy  There are treatments available which can be discussed between you and your caregiver which will permanently treat the problem. These treatments range from surgery (removal of the thyroid), to the use of radioactive iodine (destroys the thyroid by radiation), to the use of antithyroid drugs (interfere with hormone synthesis). The first two treatments are permanent and usually successful. They most often require hormone replacement therapy for life. This is because  it is impossible to remove or destroy the exact amount of thyroid required to make a person euthyroid (normal).  HOME CARE INSTRUCTIONS   See your caregiver if the problems you are being treated for get worse. Examples of this would be the problems listed above.  SEEK MEDICAL CARE IF:  Your general condition worsens.  MAKE SURE YOU:   · Understand these instructions.  · Will watch your condition.  · Will get help right away if you are not doing well or get worse.  Document Released: 08/13/2005 Document Revised: 11/05/2011 Document Reviewed: 12/25/2006  ExitCare® Patient Information ©2013 [employer].

## 2013-01-07 NOTE — Assessment & Plan Note (Signed)
His s/s are concerning I will check his TFT's today and will address

## 2013-01-07 NOTE — Progress Notes (Signed)
  Subjective:    Patient ID: Eugene Mccullough, male    DOB: [birth year], 52 y.o.   MRN: [id number]  Thyroid Problem Presents for initial visit. Symptoms include fatigue and weight loss. Patient reports no anxiety, cold intolerance, constipation, depressed mood, diaphoresis, diarrhea, dry skin, hair loss, heat intolerance, hoarse voice, leg swelling, nail problem, palpitations, tremors, visual change or weight gain. The symptoms have been stable. Past treatments include nothing. There is no history of atrial fibrillation or obesity.      Review of Systems  Constitutional: Positive for weight loss and fatigue. Negative for fever, chills, weight gain, diaphoresis, activity change, appetite change and unexpected weight change.  HENT: Positive for tinnitus. Negative for ear pain, hoarse voice, facial swelling, neck pain and neck stiffness.   Eyes: Negative.   Respiratory: Negative.  Negative for cough, chest tightness, shortness of breath, wheezing and stridor.   Cardiovascular: Negative.  Negative for chest pain, palpitations and leg swelling.  Gastrointestinal: Negative.  Negative for diarrhea and constipation.  Endocrine: Negative.  Negative for cold intolerance and heat intolerance.  Genitourinary: Negative.   Musculoskeletal: Negative.   Skin: Negative.   Allergic/Immunologic: Negative.   Neurological: Negative.  Negative for dizziness, tremors and light-headedness.  Hematological: Negative.  Negative for adenopathy. Does not bruise/bleed easily.  Psychiatric/Behavioral: Negative.        Objective:   Physical Exam  Vitals reviewed. Constitutional: He is oriented to person, place, and time. He appears well-developed and well-nourished. No distress.  HENT:  Head: Normocephalic and atraumatic.  Mouth/Throat: Oropharynx is clear and moist. No oropharyngeal exudate.  Eyes: Conjunctivae are normal. Right eye exhibits no discharge. Left eye exhibits no discharge. No scleral icterus.  Neck:  Normal range of motion. Neck supple. No JVD present. No tracheal deviation present. No thyromegaly present.  Cardiovascular: Normal rate, regular rhythm, normal heart sounds and intact distal pulses.  Exam reveals no gallop and no friction rub.   No murmur heard. Pulmonary/Chest: Effort normal and breath sounds normal. No stridor. No respiratory distress. He has no wheezes. He has no rales. He exhibits no tenderness.  Abdominal: Soft. Bowel sounds are normal. He exhibits no distension and no mass. There is no tenderness. There is no rebound and no guarding.  Musculoskeletal: Normal range of motion. He exhibits no edema and no tenderness.  Lymphadenopathy:    He has no cervical adenopathy.  Neurological: He is oriented to person, place, and time.  Skin: Skin is warm and dry. No rash noted. He is not diaphoretic. No erythema. No pallor.  Psychiatric: He has a normal mood and affect. His behavior is normal. Judgment and thought content normal.     Lab Results  Component Value Date   WBC 7.6 01/02/2013   HGB 15.1 01/02/2013   HCT 44.6 01/02/2013   PLT 193.0 01/02/2013   GLUCOSE 74 01/02/2013   CHOL 184 01/02/2013   TRIG 173.0* 01/02/2013   HDL 33.20* 01/02/2013   LDLCALC 116* 01/02/2013   ALT 21 01/02/2013   AST 20 01/02/2013   NA 135 01/02/2013   K 4.9 01/02/2013   CL 102 01/02/2013   CREATININE 1.2 01/02/2013   BUN 21 01/02/2013   CO2 24 01/02/2013   TSH 0.31* 01/02/2013   PSA 1.25 01/02/2013   HGBA1C 5.6 04/05/2010       Assessment & Plan:

## 2013-01-07 NOTE — Assessment & Plan Note (Signed)
Audiology referral.

## 2013-01-07 NOTE — Assessment & Plan Note (Signed)
BP is well controlled 

## 2013-02-02 ENCOUNTER — Ambulatory Visit (INDEPENDENT_AMBULATORY_CARE_PROVIDER_SITE_OTHER): Payer: BC Managed Care – PPO | Admitting: Internal Medicine

## 2013-02-02 ENCOUNTER — Encounter: Payer: Self-pay | Admitting: Internal Medicine

## 2013-02-02 VITALS — BP 128/72 | HR 70 | Temp 97.2°F | Resp 16 | Wt 196.1 lb

## 2013-02-02 DIAGNOSIS — H9313 Tinnitus, bilateral: Secondary | ICD-10-CM

## 2013-02-02 DIAGNOSIS — R0989 Other specified symptoms and signs involving the circulatory and respiratory systems: Secondary | ICD-10-CM

## 2013-02-02 DIAGNOSIS — R0683 Snoring: Secondary | ICD-10-CM

## 2013-02-02 DIAGNOSIS — R0609 Other forms of dyspnea: Secondary | ICD-10-CM

## 2013-02-02 DIAGNOSIS — K648 Other hemorrhoids: Secondary | ICD-10-CM

## 2013-02-02 DIAGNOSIS — G4733 Obstructive sleep apnea (adult) (pediatric): Secondary | ICD-10-CM | POA: Insufficient documentation

## 2013-02-02 DIAGNOSIS — N410 Acute prostatitis: Secondary | ICD-10-CM

## 2013-02-02 DIAGNOSIS — I10 Essential (primary) hypertension: Secondary | ICD-10-CM

## 2013-02-02 DIAGNOSIS — H9319 Tinnitus, unspecified ear: Secondary | ICD-10-CM

## 2013-02-02 MED ORDER — HYDROCORTISONE ACE-PRAMOXINE 1-1 % RE FOAM: 1.0000 | Freq: Two times a day (BID) | RECTAL | Status: DC

## 2013-02-02 MED ORDER — METOPROLOL TARTRATE 50 MG PO TABS: 50.0000 mg | ORAL_TABLET | Freq: Two times a day (BID) | ORAL | Status: DC

## 2013-02-02 MED ORDER — LISINOPRIL 20 MG PO TABS: 20.0000 mg | ORAL_TABLET | Freq: Two times a day (BID) | ORAL | Status: DC

## 2013-02-02 NOTE — Patient Instructions (Signed)
Hemorrhoids Hemorrhoids are swollen veins around the rectum or anus. There are two types of hemorrhoids:   Internal hemorrhoids. These occur in the veins just inside the rectum. They may poke through to the outside and become irritated and painful.  External hemorrhoids. These occur in the veins outside the anus and can be felt as a painful swelling or hard lump near the anus. CAUSES  Pregnancy.   Obesity.   Constipation or diarrhea.   Straining to have a bowel movement.   Sitting for long periods on the toilet.  Heavy lifting or other activity that caused you to strain.  Anal intercourse. SYMPTOMS   Pain.   Anal itching or irritation.   Rectal bleeding.   Fecal leakage.   Anal swelling.   One or more lumps around the anus.  DIAGNOSIS  Your caregiver may be able to diagnose hemorrhoids by visual examination. Other examinations or tests that may be performed include:   Examination of the rectal area with a gloved hand (digital rectal exam).   Examination of anal canal using a small tube (scope).   A blood test if you have lost a significant amount of blood.  A test to look inside the colon (sigmoidoscopy or colonoscopy). TREATMENT Most hemorrhoids can be treated at home. However, if symptoms do not seem to be getting better or if you have a lot of rectal bleeding, your caregiver may perform a procedure to help make the hemorrhoids get smaller or remove them completely. Possible treatments include:   Placing a rubber band at the base of the hemorrhoid to cut off the circulation (rubber band ligation).   Injecting a chemical to shrink the hemorrhoid (sclerotherapy).   Using a tool to burn the hemorrhoid (infrared light therapy).   Surgically removing the hemorrhoid (hemorrhoidectomy).   Stapling the hemorrhoid to block blood flow to the tissue (hemorrhoid stapling).  HOME CARE INSTRUCTIONS   Eat foods with fiber, such as whole grains, beans,  nuts, fruits, and vegetables. Ask your doctor about taking products with added fiber in them (fibersupplements).  Increase fluid intake. Drink enough water and fluids to keep your urine clear or pale yellow.   Exercise regularly.   Go to the bathroom when you have the urge to have a bowel movement. Do not wait.   Avoid straining to have bowel movements.   Keep the anal area dry and clean. Use wet toilet paper or moist towelettes after a bowel movement.   Medicated creams and suppositories may be used or applied as directed.   Only take over-the-counter or prescription medicines as directed by your caregiver.   Take warm sitz baths for 15 20 minutes, 3 4 times a day to ease pain and discomfort.   Place ice packs on the hemorrhoids if they are tender and swollen. Using ice packs between sitz baths may be helpful.   Put ice in a plastic bag.   Place a towel between your skin and the bag.   Leave the ice on for 15 20 minutes, 3 4 times a day.   Do not use a donut-shaped pillow or sit on the toilet for long periods. This increases blood pooling and pain.  SEEK MEDICAL CARE IF:  You have increasing pain and swelling that is not controlled by treatment or medicine.  You have uncontrolled bleeding.  You have difficulty or you are unable to have a bowel movement.  You have pain or inflammation outside the area of the hemorrhoids. MAKE SURE YOU:    Understand these instructions.  Will watch your condition.  Will get help right away if you are not doing well or get worse. Document Released: 08/10/2000 Document Revised: 07/30/2012 Document Reviewed: 06/17/2012 ExitCare Patient Information 2014 [employer].  

## 2013-02-02 NOTE — Assessment & Plan Note (Signed)
His BP is well controlled 

## 2013-02-02 NOTE — Assessment & Plan Note (Signed)
Improvement noted 

## 2013-02-02 NOTE — Assessment & Plan Note (Signed)
Refer for a sleep study.

## 2013-02-02 NOTE — Assessment & Plan Note (Signed)
He does not want to treat this 

## 2013-02-02 NOTE — Assessment & Plan Note (Signed)
He will try proctofoam-hc

## 2013-02-02 NOTE — Progress Notes (Signed)
Subjective:    Patient ID: Eugene Mccullough, male    DOB: [birth year], 52 y.o.   MRN: [id number]  Hypertension This is a chronic problem. The current episode started more than 1 year ago. The problem has been gradually improving since onset. The problem is controlled. Pertinent negatives include no anxiety, blurred vision, chest pain, headaches, malaise/fatigue, neck pain, orthopnea, palpitations, peripheral edema, PND, shortness of breath or sweats. There are no associated agents to hypertension. Past treatments include ACE inhibitors and beta blockers. The current treatment provides moderate improvement. Compliance problems include exercise and diet.  Identifiable causes of hypertension include sleep apnea (heavy snoring).      Review of Systems  Constitutional: Positive for fatigue. Negative for fever, chills, malaise/fatigue, diaphoresis, activity change, appetite change and unexpected weight change.  HENT: Negative.  Negative for neck pain.   Eyes: Negative.  Negative for blurred vision.  Respiratory: Negative.  Negative for cough, choking, chest tightness, shortness of breath, wheezing and stridor.   Cardiovascular: Negative.  Negative for chest pain, palpitations, orthopnea, leg swelling and PND.  Gastrointestinal: Positive for rectal pain. Negative for nausea, vomiting, abdominal pain, diarrhea, constipation, blood in stool, abdominal distention and anal bleeding.  Endocrine: Negative.   Genitourinary: Positive for frequency. Negative for dysuria, urgency, hematuria, flank pain, decreased urine volume, discharge, enuresis, difficulty urinating and testicular pain.  Musculoskeletal: Negative.   Skin: Negative.   Allergic/Immunologic: Negative.   Neurological: Negative.  Negative for dizziness and headaches.  Hematological: Negative.   Psychiatric/Behavioral: Negative.        Objective:   Physical Exam  Vitals reviewed. Constitutional: He is oriented to person, place, and time. He  appears well-developed and well-nourished. No distress.  HENT:  Head: Normocephalic and atraumatic.  Mouth/Throat: Oropharynx is clear and moist. No oropharyngeal exudate.  Eyes: Conjunctivae are normal. Right eye exhibits no discharge. Left eye exhibits no discharge. No scleral icterus.  Neck: Normal range of motion. Neck supple. No JVD present. No tracheal deviation present. No thyromegaly present.  Cardiovascular: Normal rate, regular rhythm, normal heart sounds and intact distal pulses.  Exam reveals no gallop and no friction rub.   No murmur heard. Pulmonary/Chest: Effort normal and breath sounds normal. No stridor. No respiratory distress. He has no wheezes. He has no rales. He exhibits no tenderness.  Abdominal: Soft. Bowel sounds are normal. He exhibits no distension and no mass. There is no tenderness. There is no rebound and no guarding.  Genitourinary: Prostate normal. Rectal exam shows internal hemorrhoid. Rectal exam shows no external hemorrhoid, no fissure, no mass, no tenderness and anal tone normal. Guaiac negative stool. Prostate is not enlarged and not tender.  Musculoskeletal: Normal range of motion. He exhibits no edema and no tenderness.  Lymphadenopathy:    He has no cervical adenopathy.  Neurological: He is oriented to person, place, and time.  Skin: Skin is warm and dry. No rash noted. He is not diaphoretic. No erythema. No pallor.  Psychiatric: He has a normal mood and affect. His behavior is normal. Judgment and thought content normal.     Lab Results  Component Value Date   WBC 7.6 01/02/2013   HGB 15.1 01/02/2013   HCT 44.6 01/02/2013   PLT 193.0 01/02/2013   GLUCOSE 74 01/02/2013   CHOL 184 01/02/2013   TRIG 173.0* 01/02/2013   HDL 33.20* 01/02/2013   LDLCALC 116* 01/02/2013   ALT 21 01/02/2013   AST 20 01/02/2013   NA 135 01/02/2013   K 4.9 01/02/2013  CL 102 01/02/2013   CREATININE 1.2 01/02/2013   BUN 21 01/02/2013   CO2 24 01/02/2013   TSH 0.51 01/07/2013   PSA 1.25 01/02/2013    HGBA1C 5.6 04/05/2010       Assessment & Plan:

## 2013-02-25 ENCOUNTER — Other Ambulatory Visit: Payer: Self-pay | Admitting: Internal Medicine

## 2013-03-03 ENCOUNTER — Other Ambulatory Visit: Payer: Self-pay | Admitting: Internal Medicine

## 2013-03-06 ENCOUNTER — Ambulatory Visit (INDEPENDENT_AMBULATORY_CARE_PROVIDER_SITE_OTHER): Payer: BC Managed Care – PPO | Admitting: Pulmonary Disease

## 2013-03-06 ENCOUNTER — Encounter: Payer: Self-pay | Admitting: Pulmonary Disease

## 2013-03-06 VITALS — BP 110/74 | HR 54 | Temp 98.0°F | Ht 67.0 in | Wt 196.0 lb

## 2013-03-06 DIAGNOSIS — R0683 Snoring: Secondary | ICD-10-CM

## 2013-03-06 DIAGNOSIS — R0609 Other forms of dyspnea: Secondary | ICD-10-CM

## 2013-03-06 DIAGNOSIS — R0989 Other specified symptoms and signs involving the circulatory and respiratory systems: Secondary | ICD-10-CM

## 2013-03-06 NOTE — Assessment & Plan Note (Signed)
He has snoring, sleep disruption, witnessed apnea, and daytime sleepiness.  He has history of hypertension on multiple medications.  I am concerned he could have sleep apnea.  We discussed how sleep apnea can affect various health problems including risks for hypertension, cardiovascular disease, and diabetes.  We also discussed how sleep disruption can increase risks for accident, such as while driving.  Weight loss as a means of improving sleep apnea was also reviewed.  Additional treatment options discussed were CPAP therapy, oral appliance, and surgical intervention.  To further assess will arrange for in lab sleep study.

## 2013-03-06 NOTE — Progress Notes (Signed)
Chief Complaint  Patient presents with  . Sleep Consult    referred by Dr. Yetta Barre for snoring. Epworth Score: 9.    History of Present Illness: Eugene Mccullough is a 52 y.o. male for evaluation of sleep problems.  His brother is a Development worker, community.  He was visiting his brother recently, and his brother was concerned about his snoring, and witnessed apnea.  He has trouble sleeping on his back, and wakes up with a cough.  He feels exhausted all the time, and drinks tea/coffee through out the day to stay awake.  He can fall asleep easily if he is sitting quiet.  He goes to sleep at 12 am.  He falls asleep after 1 hour.  He wakes up 2 to 3 times to use the bathroom.  He gets out of bed at 7 am .  He feels tired in the morning.  He denies morning headache.  He does not use anything to help him fall sleep or stay awake.  He denies sleep walking, sleep talking, bruxism, or nightmares.  There is no history of restless legs.  He denies sleep hallucinations, sleep paralysis, or cataplexy.  The Epworth score is 9 out of 24.  Tests:   Eugene Mccullough  has a past medical history of Gastric ulcer; H. pylori infection; GERD (gastroesophageal reflux disease); IBS (irritable bowel syndrome); and Gout.  Eugene Mccullough  has past surgical history that includes Cholecystectomy (2005).  Prior to Admission medications   Medication Sig Start Date End Date Taking? Authorizing Provider  AMLODIPINE BESYLATE PO Take by mouth daily.   Yes Historical Provider, MD  hydrocortisone-pramoxine (PROCTOFOAM HC) rectal foam Place 1 applicator rectally 2 (two) times daily. 02/02/13  Yes Etta Grandchild, MD  lisinopril (PRINIVIL,ZESTRIL) 20 MG tablet Take 1 tablet (20 mg total) by mouth 2 (two) times daily. 02/02/13  Yes Etta Grandchild, MD  metoprolol (LOPRESSOR) 50 MG tablet Take 1 tablet (50 mg total) by mouth 2 (two) times daily. 02/02/13  Yes Etta Grandchild, MD  omeprazole (PRILOSEC) 40 MG capsule Take 40 mg by mouth daily.     Yes  Historical Provider, MD  silodosin (RAPAFLO) 8 MG CAPS capsule Take 1 capsule (8 mg total) by mouth daily with breakfast. 01/02/13  Yes Etta Grandchild, MD    Allergies  Allergen Reactions  . Hydrocodone-Acetaminophen Itching    His family history includes Hyperlipidemia in his father.  There is no history of Cancer, and Early death, and Heart disease, and Hypertension, and Kidney disease, and Stroke, and Alcohol abuse, and Diabetes, .  He  reports that he quit smoking about 5 months ago. His smoking use included Cigarettes. He has a 20 pack-year smoking history. He has never used smokeless tobacco. He reports that he does not drink alcohol or use illicit drugs.  Review of Systems  Constitutional: Positive for unexpected weight change. Negative for fever, chills, diaphoresis, activity change, appetite change and fatigue.  HENT: Positive for ear pain and dental problem. Negative for hearing loss, nosebleeds, congestion, sore throat, facial swelling, rhinorrhea, sneezing, mouth sores, trouble swallowing, neck pain, neck stiffness, voice change, postnasal drip, sinus pressure, tinnitus and ear discharge.   Eyes: Negative for photophobia, discharge, itching and visual disturbance.  Respiratory: Positive for cough. Negative for apnea, choking, chest tightness, shortness of breath, wheezing and stridor.   Cardiovascular: Positive for palpitations. Negative for chest pain and leg swelling.  Gastrointestinal: Positive for abdominal pain. Negative for nausea, vomiting, constipation, blood in stool and  abdominal distention.  Genitourinary: Negative for dysuria, urgency, frequency, hematuria, flank pain, decreased urine volume and difficulty urinating.  Musculoskeletal: Positive for joint swelling. Negative for myalgias, back pain, arthralgias and gait problem.  Skin: Negative for color change, pallor and rash.  Neurological: Negative for dizziness, tremors, seizures, syncope, speech difficulty, weakness,  light-headedness, numbness and headaches.  Hematological: Negative for adenopathy. Does not bruise/bleed easily.  Psychiatric/Behavioral: Negative for confusion, sleep disturbance and agitation. The patient is not nervous/anxious.    Physical Exam:  General - No distress ENT - No sinus tenderness, no oral exudate, MP 4, no LAN, no thyromegaly, TM clear, pupils equal/reactive Cardiac - s1s2 regular, no murmur, pulses symmetric Chest - No wheeze/rales/dullness, good air entry, normal respiratory excursion Back - No focal tenderness Abd - Soft, non-tender, no organomegaly, + bowel sounds Ext - No edema Neuro - Normal strength, cranial nerves intact Skin - No rashes Psych - Normal mood, and behavior

## 2013-03-06 NOTE — Progress Notes (Deleted)
  Subjective:    Patient ID: Eugene Mccullough, male    DOB: Jul 27, 1961, 52 y.o.   MRN: [id number]  HPI    Review of Systems  Constitutional: Positive for unexpected weight change. Negative for fever, chills, diaphoresis, activity change, appetite change and fatigue.  HENT: Positive for ear pain and dental problem. Negative for hearing loss, nosebleeds, congestion, sore throat, facial swelling, rhinorrhea, sneezing, mouth sores, trouble swallowing, neck pain, neck stiffness, voice change, postnasal drip, sinus pressure, tinnitus and ear discharge.   Eyes: Negative for photophobia, discharge, itching and visual disturbance.  Respiratory: Positive for cough. Negative for apnea, choking, chest tightness, shortness of breath, wheezing and stridor.   Cardiovascular: Positive for palpitations. Negative for chest pain and leg swelling.  Gastrointestinal: Positive for abdominal pain. Negative for nausea, vomiting, constipation, blood in stool and abdominal distention.  Genitourinary: Negative for dysuria, urgency, frequency, hematuria, flank pain, decreased urine volume and difficulty urinating.  Musculoskeletal: Positive for joint swelling. Negative for myalgias, back pain, arthralgias and gait problem.  Skin: Negative for color change, pallor and rash.  Neurological: Negative for dizziness, tremors, seizures, syncope, speech difficulty, weakness, light-headedness, numbness and headaches.  Hematological: Negative for adenopathy. Does not bruise/bleed easily.  Psychiatric/Behavioral: Negative for confusion, sleep disturbance and agitation. The patient is not nervous/anxious.        Objective:   Physical Exam        Assessment & Plan:

## 2013-03-06 NOTE — Patient Instructions (Signed)
Will arrange for sleep study Will call to arrange for follow up after sleep study reviewed 

## 2013-03-31 ENCOUNTER — Ambulatory Visit (HOSPITAL_BASED_OUTPATIENT_CLINIC_OR_DEPARTMENT_OTHER): Payer: BC Managed Care – PPO | Attending: Pulmonary Disease

## 2013-03-31 VITALS — Ht 67.0 in | Wt 196.0 lb

## 2013-03-31 DIAGNOSIS — I10 Essential (primary) hypertension: Secondary | ICD-10-CM | POA: Insufficient documentation

## 2013-03-31 DIAGNOSIS — G4733 Obstructive sleep apnea (adult) (pediatric): Secondary | ICD-10-CM

## 2013-03-31 DIAGNOSIS — R0683 Snoring: Secondary | ICD-10-CM

## 2013-04-02 ENCOUNTER — Telehealth: Payer: Self-pay | Admitting: Pulmonary Disease

## 2013-04-02 DIAGNOSIS — G4733 Obstructive sleep apnea (adult) (pediatric): Secondary | ICD-10-CM

## 2013-04-02 NOTE — Telephone Encounter (Signed)
PSG 03/31/13 >> AHI 13.6, SpO2 low 90%, PLMI 34.5  Will have my nurse inform pt that sleep study shows mild sleep apnea, and will need toschedule ROV to review results in more detail.

## 2013-04-02 NOTE — Telephone Encounter (Signed)
Pt is aware of results. Appointment has been scheduled for 05/21/13 @ 1:30pm.

## 2013-04-03 NOTE — Procedures (Signed)
NAME:  Eugene Mccullough, Eugene Mccullough NO.:  [id number]  MEDICAL RECORD NO.:  [id number]          PATIENT TYPE:  OUT  LOCATION:  SLEEP CENTER                 FACILITY:  [facility]  PHYSICIAN:  Coralyn Helling, MD        DATE OF BIRTH:  1961-03-01  DATE OF STUDY:  03/31/2013                           NOCTURNAL POLYSOMNOGRAM  REFERRING PHYSICIAN:  Coralyn Helling, MD  INDICATION FOR STUDY:  [name] is a 52 year old male, who reports snoring, sleep disruption, and daytime sleepiness.  He also has a history hypertension.  He is referred to the sleep lab for evaluation of hypersomnia with obstructive sleep apnea.  Height is 5 feet 7 inches, weight is 196 pounds, BMI is 31, neck size is 17 inches.  EPWORTH SLEEPINESS SCORE:  6.  SLEEP ARCHITECTURE:  Total recording time was 378 minutes.  Total sleep time was 256 minutes.  Sleep efficiency was 67%.  Sleep latency is 22 minutes.  REM latency was 185 minutes.  The patient was observed in all stages of sleep and slept predominantly in the non-supine position.  RESPIRATORY DATA:  The average respiratory rate is 17.  Loud snoring was noted by the technician.  The overall apnea/hypopnea index was 13.6. There were 2 central apneic events.  The remainder of the events were obstructive in nature.  OXYGEN DATA:  The baseline oxygenation was 98%.  The oxygen saturation nadir was 90%.  The study was conducted without the use of supplemental oxygen.  CARDIAC DATA:  The average heart rate was 68 and the rhythm strip showed sinus rhythm with occasional PACs.  MOVEMENT-PARASOMNIA:  The periodic limb movement index was 34.5.  The patient had 2 restroom trips.  IMPRESSIONS-RECOMMENDATIONS:  This study shows evidence for mild obstructive sleep apnea with an apnea/hypopnea index of 13.6 and oxygen saturation nadir of 90%.  He had an increase in his periodic limb movement index and clinical correlation would be necessary to determine the significance  of this.  Additional therapeutic interventions for his obstructive sleep apnea include weight reduction, CPAP therapy, oral appliance, or surgical intervention.     Coralyn Helling, MD Diplomat, American Board of Sleep Medicine    VS/MEDQ  D:  04/02/2013 11:45:56  T:  04/03/2013 01:59:23  Job:  [id number]

## 2013-04-08 ENCOUNTER — Encounter (HOSPITAL_BASED_OUTPATIENT_CLINIC_OR_DEPARTMENT_OTHER): Payer: BC Managed Care – PPO

## 2013-05-04 ENCOUNTER — Ambulatory Visit: Payer: BC Managed Care – PPO | Admitting: Internal Medicine

## 2013-05-05 ENCOUNTER — Ambulatory Visit (INDEPENDENT_AMBULATORY_CARE_PROVIDER_SITE_OTHER): Payer: BC Managed Care – PPO | Admitting: Internal Medicine

## 2013-05-05 ENCOUNTER — Encounter: Payer: Self-pay | Admitting: Internal Medicine

## 2013-05-05 VITALS — BP 120/72 | HR 64 | Temp 98.2°F | Resp 16 | Wt 203.0 lb

## 2013-05-05 DIAGNOSIS — L259 Unspecified contact dermatitis, unspecified cause: Secondary | ICD-10-CM

## 2013-05-05 DIAGNOSIS — L209 Atopic dermatitis, unspecified: Secondary | ICD-10-CM | POA: Insufficient documentation

## 2013-05-05 DIAGNOSIS — I10 Essential (primary) hypertension: Secondary | ICD-10-CM

## 2013-05-05 DIAGNOSIS — L239 Allergic contact dermatitis, unspecified cause: Secondary | ICD-10-CM

## 2013-05-05 MED ORDER — TRIAMCINOLONE ACETONIDE 0.5 % EX CREA: TOPICAL_CREAM | Freq: Three times a day (TID) | CUTANEOUS | Status: DC

## 2013-05-05 NOTE — Progress Notes (Signed)
  Subjective:    Patient ID: Eugene Mccullough, male    DOB: 03-19-1961, 52 y.o.   MRN: [id number]  Rash This is a new problem. Episode onset: 3-4 months ago. The problem is unchanged. The affected locations include the torso. The rash is characterized by itchiness and dryness. He was exposed to nothing. Pertinent negatives include no anorexia, congestion, cough, diarrhea, eye pain, facial edema, fatigue, fever, joint pain, nail changes, rhinorrhea, shortness of breath, sore throat or vomiting. Treatments tried: olive oil. The treatment provided no relief. His past medical history is significant for eczema. There is no history of allergies or varicella.      Review of Systems  Constitutional: Negative.  Negative for fever, chills, diaphoresis, appetite change and fatigue.  HENT: Negative.  Negative for congestion, sore throat and rhinorrhea.   Eyes: Negative.  Negative for pain.  Respiratory: Negative.  Negative for cough, choking, chest tightness, shortness of breath, wheezing and stridor.   Cardiovascular: Negative.  Negative for chest pain, palpitations and leg swelling.  Gastrointestinal: Negative.  Negative for nausea, vomiting, abdominal pain, diarrhea, constipation, blood in stool and anorexia.  Endocrine: Negative.   Genitourinary: Negative.   Musculoskeletal: Negative.  Negative for joint pain.  Skin: Positive for rash. Negative for nail changes, color change, pallor and wound.  Allergic/Immunologic: Negative.   Neurological: Negative.   Hematological: Negative.  Negative for adenopathy. Does not bruise/bleed easily.  Psychiatric/Behavioral: Negative.        Objective:   Physical Exam  Vitals reviewed. Constitutional: He is oriented to person, place, and time. He appears well-developed and well-nourished. No distress.  HENT:  Head: Normocephalic and atraumatic.  Mouth/Throat: Oropharynx is clear and moist. No oropharyngeal exudate.  Eyes: Conjunctivae are normal. Right eye  exhibits no discharge. Left eye exhibits no discharge. No scleral icterus.  Neck: Normal range of motion. Neck supple. No JVD present. No tracheal deviation present. No thyromegaly present.  Cardiovascular: Normal rate, regular rhythm, normal heart sounds and intact distal pulses.  Exam reveals no gallop and no friction rub.   No murmur heard. Pulmonary/Chest: Effort normal and breath sounds normal. No stridor. No respiratory distress. He has no wheezes. He has no rales. He exhibits no tenderness.  Abdominal: Soft. Bowel sounds are normal. He exhibits no distension and no mass. There is no tenderness. There is no guarding.  Musculoskeletal: Normal range of motion. He exhibits no tenderness.  Lymphadenopathy:    He has no cervical adenopathy.  Neurological: He is oriented to person, place, and time.  Skin: Skin is warm, dry and intact. Rash noted. No purpura noted. Rash is not macular, not papular, not maculopapular, not nodular, not pustular, not vesicular and not urticarial. He is not diaphoretic. No pallor. Nails show no clubbing.        Lab Results  Component Value Date   WBC 7.6 01/02/2013   HGB 15.1 01/02/2013   HCT 44.6 01/02/2013   PLT 193.0 01/02/2013   GLUCOSE 74 01/02/2013   CHOL 184 01/02/2013   TRIG 173.0* 01/02/2013   HDL 33.20* 01/02/2013   LDLCALC 116* 01/02/2013   ALT 21 01/02/2013   AST 20 01/02/2013   NA 135 01/02/2013   K 4.9 01/02/2013   CL 102 01/02/2013   CREATININE 1.2 01/02/2013   BUN 21 01/02/2013   CO2 24 01/02/2013   TSH 0.51 01/07/2013   PSA 1.25 01/02/2013   HGBA1C 5.6 04/05/2010       Assessment & Plan:

## 2013-05-05 NOTE — Assessment & Plan Note (Signed)
Will treat with TAC cream He wants to see derm as well

## 2013-05-05 NOTE — Patient Instructions (Signed)

## 2013-05-05 NOTE — Assessment & Plan Note (Signed)
His BP is well controlled 

## 2013-05-21 ENCOUNTER — Encounter: Payer: Self-pay | Admitting: Pulmonary Disease

## 2013-05-21 ENCOUNTER — Ambulatory Visit (INDEPENDENT_AMBULATORY_CARE_PROVIDER_SITE_OTHER): Payer: BC Managed Care – PPO | Admitting: Pulmonary Disease

## 2013-05-21 VITALS — BP 120/82 | HR 62 | Temp 98.0°F | Ht 67.0 in | Wt 207.0 lb

## 2013-05-21 DIAGNOSIS — G4733 Obstructive sleep apnea (adult) (pediatric): Secondary | ICD-10-CM

## 2013-05-21 NOTE — Assessment & Plan Note (Signed)
He has mild sleep apnea.  I have reviewed the recent sleep study results with the patient.  We discussed how sleep apnea can affect various health problems including risks for hypertension, cardiovascular disease, and diabetes.  We also discussed how sleep disruption can increase risks for accident, such as while driving.  Weight loss as a means of improving sleep apnea was also reviewed.  Additional treatment options discussed were CPAP therapy, oral appliance, and surgical intervention.  Will arrange for auto CPAP set up. 

## 2013-05-21 NOTE — Progress Notes (Signed)
Chief Complaint  Patient presents with  . Sleep Apnea    Review sleep study results.    History of Present Illness: Eugene Mccullough is a 52 y.o. male with mild sleep apnea.  He is here to review his sleep study.  TESTS: PSG 03/31/13 >> AHI 13.6, SpO2 low 90%, PLMI 34.5  Eugene Mccullough  has a past medical history of Gastric ulcer; H. pylori infection; GERD (gastroesophageal reflux disease); IBS (irritable bowel syndrome); and Gout.  Eugene Mccullough  has past surgical history that includes Cholecystectomy (2005).  Prior to Admission medications   Medication Sig Start Date End Date Taking? Authorizing Provider  AMLODIPINE BESYLATE PO Take by mouth daily.    Historical Provider, MD  hydrocortisone-pramoxine (PROCTOFOAM HC) rectal foam Place 1 applicator rectally 2 (two) times daily. 02/02/13   Etta Grandchild, MD  lisinopril (PRINIVIL,ZESTRIL) 20 MG tablet Take 1 tablet (20 mg total) by mouth 2 (two) times daily. 02/02/13   Etta Grandchild, MD  metoprolol (LOPRESSOR) 50 MG tablet Take 1 tablet (50 mg total) by mouth 2 (two) times daily. 02/02/13   Etta Grandchild, MD  omeprazole (PRILOSEC) 40 MG capsule Take 40 mg by mouth daily.      Historical Provider, MD  silodosin (RAPAFLO) 8 MG CAPS capsule Take 1 capsule (8 mg total) by mouth daily with breakfast. 01/02/13   Etta Grandchild, MD  triamcinolone cream (KENALOG) 0.5 % Apply topically 3 (three) times daily. 05/05/13   Etta Grandchild, MD    Allergies  Allergen Reactions  . Hydrocodone-Acetaminophen Itching     Physical Exam:  General - No distress ENT - No sinus tenderness, MP 4, no oral exudate, no LAN Cardiac - s1s2 regular, no murmur Chest - No wheeze/rales/dullness Back - No focal tenderness Abd - Soft, non-tender Ext - No edema Neuro - Normal strength Skin - No rashes Psych - normal mood, and behavior   Assessment/Plan:  Coralyn Helling, MD  Pulmonary/Critical Care/Sleep Pager:  [phone]

## 2013-05-21 NOTE — Patient Instructions (Signed)
Will arrange for CPAP set up at home Follow up in 2 months 

## 2013-06-04 ENCOUNTER — Encounter: Payer: Self-pay | Admitting: Internal Medicine

## 2013-06-04 ENCOUNTER — Ambulatory Visit (INDEPENDENT_AMBULATORY_CARE_PROVIDER_SITE_OTHER): Payer: BC Managed Care – PPO | Admitting: Internal Medicine

## 2013-06-04 VITALS — BP 130/90 | HR 66 | Temp 98.1°F | Ht 66.0 in | Wt 206.2 lb

## 2013-06-04 DIAGNOSIS — K589 Irritable bowel syndrome without diarrhea: Secondary | ICD-10-CM

## 2013-06-04 DIAGNOSIS — N401 Enlarged prostate with lower urinary tract symptoms: Secondary | ICD-10-CM

## 2013-06-04 DIAGNOSIS — R339 Retention of urine, unspecified: Secondary | ICD-10-CM

## 2013-06-04 MED ORDER — DICYCLOMINE HCL 10 MG PO CAPS: 10.0000 mg | ORAL_CAPSULE | Freq: Three times a day (TID) | ORAL | Status: DC

## 2013-06-04 NOTE — Assessment & Plan Note (Signed)
Will refer to urology per pt request Continue rapaflo for now

## 2013-06-04 NOTE — Assessment & Plan Note (Signed)
Will start Bentyl  RTC if cramping persist or worsens

## 2013-06-04 NOTE — Patient Instructions (Signed)
Diet and Irritable Bowel Syndrome   No cure has been found for irritable bowel syndrome (IBS). Many options are available to treat the symptoms. Your caregiver will give you the best treatments available for your symptoms. He or she will also encourage you to manage stress and to make changes to your diet. You need to work with your caregiver and Registered Dietician to find the best combination of medicine, diet, counseling, and support to control your symptoms. The following are some diet suggestions.  FOODS THAT MAKE IBS WORSE  · Fatty foods, such as French fries.  · Milk products, such as cheese or ice cream.  · Chocolate.  · Alcohol.  · Caffeine (found in coffee and some sodas).  · Carbonated drinks, such as soda.  If certain foods cause symptoms, you should eat less of them or stop eating them.  FOOD JOURNAL   · Keep a journal of the foods that seem to cause distress. Write down:  · What you are eating during the day and when.  · What problems you are having after eating.  · When the symptoms occur in relation to your meals.  · What foods always make you feel badly.  · Take your notes with you to your caregiver to see if you should stop eating certain foods.  FOODS THAT MAKE IBS BETTER  Fiber reduces IBS symptoms, especially constipation, because it makes stools soft, bulky, and easier to pass. Fiber is found in bran, bread, cereal, beans, fruit, and vegetables. Examples of foods with fiber include:  · Apples.  · Peaches.  · Pears.  · Berries.  · Figs.  · Broccoli, raw.  · Cabbage.  · Carrots.  · Raw peas.  · Kidney beans.  · Lima beans.  · Whole-grain bread.  · Whole-grain cereal.  Add foods with fiber to your diet a little at a time. This will let your body get used to them. Too much fiber at once might cause gas and swelling of your abdomen. This can trigger symptoms in a person with IBS. Caregivers usually recommend a diet with enough fiber to produce soft, painless bowel movements. High fiber diets may  cause gas and bloating. However, these symptoms often go away within a few weeks, as your body adjusts.  In many cases, dietary fiber may lessen IBS symptoms, particularly constipation. However, it may not help pain or diarrhea. High fiber diets keep the colon mildly enlarged (distended) with the added fiber. This may help prevent spasms in the colon. Some forms of fiber also keep water in the stool, thereby preventing hard stools that are difficult to pass.   Besides telling you to eat more foods with fiber, your caregiver may also tell you to get more fiber by taking a fiber pill or drinking water mixed with a special high fiber powder. An example of this is a natural fiber laxative containing psyllium seed.   TIPS  · Large meals can cause cramping and diarrhea in people with IBS. If this happens to you, try eating 4 or 5 small meals a day, or try eating less at each of your usual 3 meals. It may also help if your meals are low in fat and high in carbohydrates. Examples of carbohydrates are pasta, rice, whole-grain breads and cereals, fruits, and vegetables.  · If dairy products cause your symptoms to flare up, you can try eating less of those foods. You might be able to handle yogurt better than other dairy products, because   it contains bacteria that helps with digestion. Dairy products are an important source of calcium and other nutrients. If you need to avoid dairy products, be sure to talk with a Registered Dietitian about getting these nutrients through other food sources.  · Drink enough water and fluids to keep your urine clear or pale yellow. This is important, especially if you have diarrhea.  FOR MORE INFORMATION   International Foundation for Functional Gastrointestinal Disorders: www.iffgd.org   National Digestive Diseases Information Clearinghouse: digestive.niddk.nih.gov  Document Released: 11/03/2003 Document Revised: 11/05/2011 Document Reviewed: 07/21/2007  ExitCare® Patient Information ©2014  [employer].

## 2013-06-04 NOTE — Progress Notes (Signed)
Subjective:    Patient ID: Eugene Mccullough, male    DOB: 1961/06/07, 52 y.o.   MRN: [id number]  HPI  Pt presents to the clinic today with c/o IBS flare. He reports abdominal cramping, gassiness and diarrhea. This started 10 days ago. He reports that he has not received treatment in the [place] for this, only overseas. He does not remember the name of the medication he was taking. He did take Imodium for the diarrhea which has helped but continues to have abdominal cramping and gas. He has not had a change is his diet. He denies nausea, vomiting or blood in his stool. Additionally, he c/o BPH with urinary retention. He was placed on rapaflo with only little improvement. He does request referral to a specialist for further evaluation. Review of Systems      Past Medical History  Diagnosis Date  . Gastric ulcer   . H. pylori infection   . GERD (gastroesophageal reflux disease)   . IBS (irritable bowel syndrome)   . Gout     Current Outpatient Prescriptions  Medication Sig Dispense Refill  . hydrocortisone-pramoxine (PROCTOFOAM HC) rectal foam Place 1 applicator rectally 2 (two) times daily.  10 g  0  . lisinopril (PRINIVIL,ZESTRIL) 20 MG tablet Take 1 tablet (20 mg total) by mouth 2 (two) times daily.  180 tablet  3  . metoprolol (LOPRESSOR) 50 MG tablet Take 1 tablet (50 mg total) by mouth 2 (two) times daily.  180 tablet  3  . omeprazole (PRILOSEC) 40 MG capsule Take 40 mg by mouth daily.        . silodosin (RAPAFLO) 8 MG CAPS capsule Take 1 capsule (8 mg total) by mouth daily with breakfast.  90 capsule  3  . triamcinolone cream (KENALOG) 0.5 % Apply topically 3 (three) times daily.  30 g  1  . dicyclomine (BENTYL) 10 MG capsule Take 1 capsule (10 mg total) by mouth 4 (four) times daily -  before meals and at bedtime.  90 capsule  0   No current facility-administered medications for this visit.    Allergies  Allergen Reactions  . Hydrocodone-Acetaminophen Itching    Family History   Problem Relation Age of Onset  . Hyperlipidemia Father   . Cancer Neg Hx   . Early death Neg Hx   . Heart disease Neg Hx   . Hypertension Neg Hx   . Kidney disease Neg Hx   . Stroke Neg Hx   . Alcohol abuse Neg Hx   . Diabetes Neg Hx     History   Social History  . Marital Status: Married    Spouse Name: N/A    Number of Children: 3  . Years of Education: N/A   Occupational History  . driver    Social History Main Topics  . Smoking status: Former Smoker -- 1.00 packs/day for 20 years    Types: Cigarettes    Quit date: 09/27/2012  . Smokeless tobacco: Never Used  . Alcohol Use: No  . Drug Use: No  . Sexual Activity: Not Currently   Other Topics Concern  . Not on file   Social History Narrative  . No narrative on file     Constitutional: Denies fever, malaise, fatigue, headache or abrupt weight changes.   Gastrointestinal: Pt reports abdominal cramping and gas. Denies constipation, diarrhea or blood in the stool.  GU: Pt reports hesitancy. Denies urgency, frequency, pain with urination, burning sensation, blood in urine, odor or discharge.  No other specific complaints in a complete review of systems (except as listed in HPI above).  Objective:   Physical Exam   BP 130/90  Pulse 66  Temp(Src) 98.1 F (36.7 C) (Oral)  Ht 5\' 6"  (1.676 m)  Wt 206 lb 3.2 oz (93.532 kg)  BMI 33.3 kg/m2  SpO2 98% Wt Readings from Last 3 Encounters:  06/04/13 206 lb 3.2 oz (93.532 kg)  05/21/13 207 lb (93.895 kg)  05/05/13 203 lb (92.08 kg)    General: Appears his stated age, well developed, well nourished in NAD. Cardiovascular: Normal rate and rhythm. S1,S2 noted.  No murmur, rubs or gallops noted. No JVD or BLE edema. No carotid bruits noted. Pulmonary/Chest: Normal effort and positive vesicular breath sounds. No respiratory distress. No wheezes, rales or ronchi noted.  Abdomen: Soft and nontender. Normal bowel sounds, no bruits noted. No distention or masses noted.  Liver, spleen and kidneys non palpable.   BMET    Component Value Date/Time   NA 135 01/02/2013 1539   K 4.9 01/02/2013 1539   CL 102 01/02/2013 1539   CO2 24 01/02/2013 1539   GLUCOSE 74 01/02/2013 1539   BUN 21 01/02/2013 1539   CREATININE 1.2 01/02/2013 1539   CALCIUM 10.1 01/02/2013 1539   GFRNONAA 85* 10/21/2012 1409   GFRAA >90 10/21/2012 1409    Lipid Panel     Component Value Date/Time   CHOL 184 01/02/2013 1539   TRIG 173.0* 01/02/2013 1539   HDL 33.20* 01/02/2013 1539   CHOLHDL 6 01/02/2013 1539   VLDL 34.6 01/02/2013 1539   LDLCALC 116* 01/02/2013 1539    CBC    Component Value Date/Time   WBC 7.6 01/02/2013 1539   RBC 5.56 01/02/2013 1539   HGB 15.1 01/02/2013 1539   HCT 44.6 01/02/2013 1539   PLT 193.0 01/02/2013 1539   MCV 80.2 01/02/2013 1539   MCHC 33.9 01/02/2013 1539   RDW 17.5* 01/02/2013 1539   LYMPHSABS 2.9 01/02/2013 1539   MONOABS 0.9 01/02/2013 1539   EOSABS 0.3 01/02/2013 1539   BASOSABS 0.1 01/02/2013 1539    Hgb A1C Lab Results  Component Value Date   HGBA1C 5.6 04/05/2010        Assessment & Plan:

## 2013-06-10 ENCOUNTER — Ambulatory Visit: Payer: BC Managed Care – PPO | Admitting: Internal Medicine

## 2013-07-21 ENCOUNTER — Ambulatory Visit (INDEPENDENT_AMBULATORY_CARE_PROVIDER_SITE_OTHER): Payer: BC Managed Care – PPO | Admitting: Pulmonary Disease

## 2013-07-21 ENCOUNTER — Encounter: Payer: Self-pay | Admitting: Pulmonary Disease

## 2013-07-21 VITALS — BP 120/80 | HR 60 | Ht 67.0 in | Wt 212.0 lb

## 2013-07-21 DIAGNOSIS — G4733 Obstructive sleep apnea (adult) (pediatric): Secondary | ICD-10-CM

## 2013-07-21 NOTE — Patient Instructions (Signed)
Will arrange for referral to Dr. Mark Katz to get fitted for oral appliance to treat obstructive sleep apnea Follow up in 6 months 

## 2013-07-21 NOTE — Progress Notes (Signed)
Chief Complaint  Patient presents with  . Sleep Apnea    Has not been using CPAP machine. States that the mask is suffocating him.     History of Present Illness: Eugene Mccullough is a 52 y.o. male with mild sleep apnea intolerant of CPAP.  He has tried using CPAP.  He is not able to tolerate this.  He has tried different masks.  He does not feel adjusting pressure settings will help.    He continues to have snoring, sleep disruption, daytime sleepiness, and witnessed apnea.  TESTS: PSG 03/31/13 >> AHI 13.6, SpO2 low 90%, PLMI 34.5  Eugene Mccullough  has a past medical history of Gastric ulcer; H. pylori infection; GERD (gastroesophageal reflux disease); IBS (irritable bowel syndrome); and Gout.  Eugene Mccullough  has past surgical history that includes Cholecystectomy (2005).  Prior to Admission medications   Medication Sig Start Date End Date Taking? Authorizing Provider  AMLODIPINE BESYLATE PO Take by mouth daily.    Historical Provider, MD  hydrocortisone-pramoxine (PROCTOFOAM HC) rectal foam Place 1 applicator rectally 2 (two) times daily. 02/02/13   Etta Grandchild, MD  lisinopril (PRINIVIL,ZESTRIL) 20 MG tablet Take 1 tablet (20 mg total) by mouth 2 (two) times daily. 02/02/13   Etta Grandchild, MD  metoprolol (LOPRESSOR) 50 MG tablet Take 1 tablet (50 mg total) by mouth 2 (two) times daily. 02/02/13   Etta Grandchild, MD  omeprazole (PRILOSEC) 40 MG capsule Take 40 mg by mouth daily.      Historical Provider, MD  silodosin (RAPAFLO) 8 MG CAPS capsule Take 1 capsule (8 mg total) by mouth daily with breakfast. 01/02/13   Etta Grandchild, MD  triamcinolone cream (KENALOG) 0.5 % Apply topically 3 (three) times daily. 05/05/13   Etta Grandchild, MD    Allergies  Allergen Reactions  . Hydrocodone-Acetaminophen Itching     Physical Exam:  General - No distress ENT - No sinus tenderness, MP 4, no oral exudate, no LAN Cardiac - s1s2 regular, no murmur Chest - No wheeze/rales/dullness Back - No  focal tenderness Abd - Soft, non-tender Ext - No edema Neuro - Normal strength Skin - No rashes Psych - normal mood, and behavior   Assessment/Plan:  Coralyn Helling, MD [place] [facility]/Critical Care/Sleep Pager:  [phone]

## 2013-07-27 NOTE — Assessment & Plan Note (Addendum)
He has been intolerant of CPAP.  Will have his CPAP equipment discontinued and removed from his home.  Will arrange for referral to Dr. Althea Grimmer to assess for oral appliance to treat obstructive sleep apnea.

## 2013-08-03 ENCOUNTER — Other Ambulatory Visit: Payer: Self-pay | Admitting: Internal Medicine

## 2013-08-03 NOTE — Telephone Encounter (Signed)
TLJ pt 

## 2013-08-06 ENCOUNTER — Other Ambulatory Visit: Payer: Self-pay | Admitting: Internal Medicine

## 2013-08-06 DIAGNOSIS — K589 Irritable bowel syndrome without diarrhea: Secondary | ICD-10-CM

## 2013-08-06 MED ORDER — DICYCLOMINE HCL 10 MG PO CAPS: 10.0000 mg | ORAL_CAPSULE | Freq: Three times a day (TID) | ORAL | Status: DC

## 2013-08-24 MED ORDER — DICYCLOMINE HCL 10 MG PO CAPS: 10.0000 mg | ORAL_CAPSULE | Freq: Three times a day (TID) | ORAL | Status: DC

## 2013-08-24 MED ORDER — AMLODIPINE BESYLATE 5 MG PO TABS: 5.0000 mg | ORAL_TABLET | Freq: Every day | ORAL | Status: DC

## 2013-08-24 NOTE — Addendum Note (Signed)
Addended by: Lorre Munroe on: 08/24/2013 08:02 AM   Modules accepted: Orders

## 2013-08-26 MED ORDER — AMLODIPINE BESYLATE 10 MG PO TABS: 10.0000 mg | ORAL_TABLET | Freq: Every day | ORAL | Status: DC

## 2013-08-26 NOTE — Addendum Note (Signed)
Addended by: Lorre Munroe on: 08/26/2013 10:12 AM   Modules accepted: Orders, Medications

## 2013-10-01 ENCOUNTER — Ambulatory Visit: Payer: BC Managed Care – PPO | Admitting: Internal Medicine

## 2013-10-01 DIAGNOSIS — Z0289 Encounter for other administrative examinations: Secondary | ICD-10-CM

## 2013-10-12 ENCOUNTER — Encounter: Payer: Self-pay | Admitting: Internal Medicine

## 2013-10-12 ENCOUNTER — Ambulatory Visit (INDEPENDENT_AMBULATORY_CARE_PROVIDER_SITE_OTHER): Payer: BC Managed Care – PPO | Admitting: Internal Medicine

## 2013-10-12 VITALS — BP 120/78 | HR 80 | Temp 99.0°F | Resp 16 | Wt 215.5 lb

## 2013-10-12 DIAGNOSIS — N529 Male erectile dysfunction, unspecified: Secondary | ICD-10-CM

## 2013-10-12 DIAGNOSIS — J209 Acute bronchitis, unspecified: Secondary | ICD-10-CM

## 2013-10-12 MED ORDER — TADALAFIL 20 MG PO TABS: 20.0000 mg | ORAL_TABLET | Freq: Every day | ORAL | Status: DC | PRN

## 2013-10-12 MED ORDER — AZITHROMYCIN 500 MG PO TABS: 500.0000 mg | ORAL_TABLET | Freq: Every day | ORAL | Status: DC

## 2013-10-12 MED ORDER — PROMETHAZINE-DM 6.25-15 MG/5ML PO SYRP: 5.0000 mL | ORAL_SOLUTION | Freq: Four times a day (QID) | ORAL | Status: DC | PRN

## 2013-10-12 NOTE — Progress Notes (Signed)
Pre-visit discussion using our clinic review tool. No additional management support is needed unless otherwise documented below in the visit note.  

## 2013-10-12 NOTE — Assessment & Plan Note (Signed)
I will treat the infection with zithromax and will control the cough with phenergan-dm 

## 2013-10-12 NOTE — Patient Instructions (Signed)
Acute Bronchitis Bronchitis is inflammation of the airways that extend from the windpipe into the lungs (bronchi). The inflammation often causes mucus to develop. This leads to a cough, which is the most common symptom of bronchitis.  In acute bronchitis, the condition usually develops suddenly and goes away over time, usually in a couple weeks. Smoking, allergies, and asthma can make bronchitis worse. Repeated episodes of bronchitis may cause further lung problems.  CAUSES Acute bronchitis is most often caused by the same virus that causes a cold. The virus can spread from person to person (contagious).  SIGNS AND SYMPTOMS   Cough.   Fever.   Coughing up mucus.   Body aches.   Chest congestion.   Chills.   Shortness of breath.   Sore throat.  DIAGNOSIS  Acute bronchitis is usually diagnosed through a physical exam. Tests, such as chest X-rays, are sometimes done to rule out other conditions.  TREATMENT  Acute bronchitis usually goes away in a couple weeks. Often times, no medical treatment is necessary. Medicines are sometimes given for relief of fever or cough. Antibiotics are usually not needed but may be prescribed in certain situations. In some cases, an inhaler may be recommended to help reduce shortness of breath and control the cough. A cool mist vaporizer may also be used to help thin bronchial secretions and make it easier to clear the chest.  HOME CARE INSTRUCTIONS  Get plenty of rest.   Drink enough fluids to keep your urine clear or pale yellow (unless you have a medical condition that requires fluid restriction). Increasing fluids may help thin your secretions and will prevent dehydration.   Only take over-the-counter or prescription medicines as directed by your health care provider.   Avoid smoking and secondhand smoke. Exposure to cigarette smoke or irritating chemicals will make bronchitis worse. If you are a smoker, consider using nicotine gum or skin  patches to help control withdrawal symptoms. Quitting smoking will help your lungs heal faster.   Reduce the chances of another bout of acute bronchitis by washing your hands frequently, avoiding people with cold symptoms, and trying not to touch your hands to your mouth, nose, or eyes.   Follow up with your health care provider as directed.  SEEK MEDICAL CARE IF: Your symptoms do not improve after 1 week of treatment.  SEEK IMMEDIATE MEDICAL CARE IF:  You develop an increased fever or chills.   You have chest pain.   You have severe shortness of breath.  You have bloody sputum.   You develop dehydration.  You develop fainting.  You develop repeated vomiting.  You develop a severe headache. MAKE SURE YOU:   Understand these instructions.  Will watch your condition.  Will get help right away if you are not doing well or get worse. Document Released: 09/20/2004 Document Revised: 04/15/2013 Document Reviewed: 02/03/2013 ExitCare Patient Information 2014 [employer].  

## 2013-10-12 NOTE — Progress Notes (Signed)
Subjective:    Patient ID: Eugene Mccullough, male    DOB: 02-21-1961, 53 y.o.   MRN: [id number]  Cough This is a new problem. The current episode started in the past 7 days. The problem has been gradually worsening. The problem occurs every few hours. The cough is productive of purulent sputum. Associated symptoms include a sore throat. Pertinent negatives include no chest pain, chills, ear congestion, ear pain, fever, headaches, heartburn, hemoptysis, myalgias, nasal congestion, postnasal drip, rash, rhinorrhea, shortness of breath, sweats, weight loss or wheezing. Nothing aggravates the symptoms. He has tried nothing for the symptoms. The treatment provided mild relief. There is no history of asthma, bronchiectasis, bronchitis, COPD, emphysema, environmental allergies or pneumonia.      Review of Systems  Constitutional: Negative.  Negative for fever, chills, weight loss, diaphoresis, appetite change and fatigue.  HENT: Positive for sinus pressure and sore throat. Negative for ear pain, postnasal drip, rhinorrhea, tinnitus and trouble swallowing.   Eyes: Negative.   Respiratory: Positive for cough. Negative for hemoptysis, choking, chest tightness, shortness of breath, wheezing and stridor.   Cardiovascular: Negative.  Negative for chest pain, palpitations and leg swelling.  Gastrointestinal: Negative.  Negative for heartburn, nausea, vomiting, abdominal pain, diarrhea, constipation and blood in stool.  Endocrine: Negative.   Genitourinary: Negative.        He complains of ED  Musculoskeletal: Negative.  Negative for myalgias.  Skin: Negative.  Negative for rash.  Allergic/Immunologic: Negative.  Negative for environmental allergies.  Neurological: Negative.  Negative for headaches.  Hematological: Negative.  Negative for adenopathy. Does not bruise/bleed easily.  Psychiatric/Behavioral: Negative.        Objective:   Physical Exam  Vitals reviewed. Constitutional: He is oriented to  person, place, and time. He appears well-developed and well-nourished.  Non-toxic appearance. He does not have a sickly appearance. He does not appear ill. No distress.  HENT:  Head: Normocephalic and atraumatic.  Right Ear: Hearing, tympanic membrane, external ear and ear canal normal.  Left Ear: Hearing, tympanic membrane, external ear and ear canal normal.  Nose: No mucosal edema or rhinorrhea. Right sinus exhibits no maxillary sinus tenderness and no frontal sinus tenderness. Left sinus exhibits no maxillary sinus tenderness and no frontal sinus tenderness.  Mouth/Throat: Oropharynx is clear and moist and mucous membranes are normal. Mucous membranes are not pale, not dry and not cyanotic. No oral lesions. No trismus in the jaw. No uvula swelling. No oropharyngeal exudate, posterior oropharyngeal edema or posterior oropharyngeal erythema.  Eyes: Conjunctivae are normal. Right eye exhibits no discharge. Left eye exhibits no discharge. No scleral icterus.  Neck: Normal range of motion. Neck supple. No JVD present. No tracheal deviation present. No thyromegaly present.  Cardiovascular: Normal rate, regular rhythm, normal heart sounds and intact distal pulses.  Exam reveals no gallop and no friction rub.   No murmur heard. Pulmonary/Chest: Effort normal and breath sounds normal. No stridor. No respiratory distress. He has no wheezes. He has no rales. He exhibits no tenderness.  Abdominal: Soft. Bowel sounds are normal. He exhibits no distension and no mass. There is no tenderness. There is no rebound and no guarding.  Musculoskeletal: Normal range of motion. He exhibits no edema and no tenderness.  Lymphadenopathy:    He has no cervical adenopathy.  Neurological: He is oriented to person, place, and time.  Skin: Skin is warm and dry. No rash noted. He is not diaphoretic. No erythema. No pallor.  Psychiatric: He has a normal mood  and affect. His behavior is normal. Judgment and thought content  normal.     Lab Results  Component Value Date   WBC 7.6 01/02/2013   HGB 15.1 01/02/2013   HCT 44.6 01/02/2013   PLT 193.0 01/02/2013   GLUCOSE 74 01/02/2013   CHOL 184 01/02/2013   TRIG 173.0* 01/02/2013   HDL 33.20* 01/02/2013   LDLCALC 116* 01/02/2013   ALT 21 01/02/2013   AST 20 01/02/2013   NA 135 01/02/2013   K 4.9 01/02/2013   CL 102 01/02/2013   CREATININE 1.2 01/02/2013   BUN 21 01/02/2013   CO2 24 01/02/2013   TSH 0.51 01/07/2013   PSA 1.25 01/02/2013   HGBA1C 5.6 04/05/2010       Assessment & Plan:

## 2013-10-12 NOTE — Assessment & Plan Note (Signed)
He will try cialis for this 

## 2013-11-11 ENCOUNTER — Encounter: Payer: Self-pay | Admitting: Internal Medicine

## 2013-11-11 ENCOUNTER — Telehealth: Payer: Self-pay | Admitting: Internal Medicine

## 2013-11-11 ENCOUNTER — Ambulatory Visit (INDEPENDENT_AMBULATORY_CARE_PROVIDER_SITE_OTHER): Payer: BC Managed Care – PPO | Admitting: Internal Medicine

## 2013-11-11 VITALS — BP 110/72 | HR 72 | Temp 98.3°F | Resp 16 | Ht 66.0 in | Wt 218.0 lb

## 2013-11-11 DIAGNOSIS — I10 Essential (primary) hypertension: Secondary | ICD-10-CM

## 2013-11-11 DIAGNOSIS — N529 Male erectile dysfunction, unspecified: Secondary | ICD-10-CM

## 2013-11-11 DIAGNOSIS — H6690 Otitis media, unspecified, unspecified ear: Secondary | ICD-10-CM | POA: Insufficient documentation

## 2013-11-11 MED ORDER — TADALAFIL 20 MG PO TABS: 20.0000 mg | ORAL_TABLET | Freq: Every day | ORAL | Status: DC | PRN

## 2013-11-11 MED ORDER — ANTIPYRINE-BENZOCAINE 5.4-1.4 % OT SOLN: 3.0000 [drp] | OTIC | Status: DC | PRN

## 2013-11-11 MED ORDER — AMOXICILLIN 875 MG PO TABS: 875.0000 mg | ORAL_TABLET | Freq: Two times a day (BID) | ORAL | Status: DC

## 2013-11-11 NOTE — Assessment & Plan Note (Signed)
His BP is well controlled 

## 2013-11-11 NOTE — Assessment & Plan Note (Signed)
Will treat the infection with amoxil He will continue tylenol for the pain and will use auralgan as well

## 2013-11-11 NOTE — Telephone Encounter (Signed)
Relevant patient education assigned to patient using Emmi. ° °

## 2013-11-11 NOTE — Progress Notes (Signed)
Subjective:    Patient ID: Eugene Mccullough, male    DOB: 25-Nov-1960, 53 y.o.   MRN: [id number]  Otalgia  There is pain in the left ear. This is a new problem. The current episode started in the past 7 days. The problem occurs constantly. The problem has been gradually worsening. There has been no fever. The fever has been present for less than 1 day. The pain is mild. Pertinent negatives include no abdominal pain, coughing, diarrhea, drainage, ear discharge, headaches, hearing loss, neck pain, rash, rhinorrhea, sore throat or vomiting. He has tried acetaminophen for the symptoms. The treatment provided mild relief.      Review of Systems  HENT: Positive for ear pain. Negative for ear discharge, hearing loss, rhinorrhea and sore throat.   Respiratory: Negative for cough.   Gastrointestinal: Negative for vomiting, abdominal pain and diarrhea.  Musculoskeletal: Negative for neck pain.  Skin: Negative for rash.  Neurological: Negative for headaches.  All other systems reviewed and are negative.       Objective:   Physical Exam  Vitals reviewed. Constitutional: He is oriented to person, place, and time. He appears well-developed and well-nourished.  Non-toxic appearance. He does not have a sickly appearance. He does not appear ill. No distress.  HENT:  Head: Normocephalic and atraumatic.  Right Ear: Hearing, tympanic membrane, external ear and ear canal normal.  Left Ear: Hearing, external ear and ear canal normal. Tympanic membrane is injected, erythematous, retracted and bulging. Tympanic membrane is not scarred and not perforated. Tympanic membrane mobility is normal.  Nose: Nose normal. No mucosal edema or rhinorrhea. Right sinus exhibits no maxillary sinus tenderness and no frontal sinus tenderness. Left sinus exhibits no maxillary sinus tenderness and no frontal sinus tenderness.  Mouth/Throat: Oropharynx is clear and moist and mucous membranes are normal. Mucous membranes are not  pale, not dry and not cyanotic. No oral lesions. No trismus in the jaw. No uvula swelling. No oropharyngeal exudate, posterior oropharyngeal edema, posterior oropharyngeal erythema or tonsillar abscesses.  Eyes: Conjunctivae are normal. Right eye exhibits no discharge. Left eye exhibits no discharge. No scleral icterus.  Neck: Normal range of motion. Neck supple. No JVD present. No tracheal deviation present. No thyromegaly present.  Cardiovascular: Normal rate, regular rhythm and intact distal pulses.  Exam reveals no gallop and no friction rub.   No murmur heard. Pulmonary/Chest: Effort normal and breath sounds normal. No stridor. No respiratory distress. He has no wheezes. He has no rales. He exhibits no tenderness.  Abdominal: Soft. Bowel sounds are normal. He exhibits no distension and no mass. There is no tenderness. There is no rebound and no guarding.  Musculoskeletal: Normal range of motion. He exhibits no edema and no tenderness.  Lymphadenopathy:       Head (right side): No submental, no submandibular, no tonsillar, no preauricular, no posterior auricular and no occipital adenopathy present.       Head (left side): No submandibular, no tonsillar, no preauricular, no posterior auricular and no occipital adenopathy present.    He has no cervical adenopathy.       Right cervical: No superficial cervical, no deep cervical and no posterior cervical adenopathy present.      Left cervical: No superficial cervical, no deep cervical and no posterior cervical adenopathy present.  Neurological: He is oriented to person, place, and time.  Skin: Skin is warm and dry. No rash noted. He is not diaphoretic. No erythema. No pallor.  Psychiatric: He has a normal mood  and affect. His behavior is normal. Judgment and thought content normal.     Lab Results  Component Value Date   WBC 7.6 01/02/2013   HGB 15.1 01/02/2013   HCT 44.6 01/02/2013   PLT 193.0 01/02/2013   GLUCOSE 74 01/02/2013   CHOL 184 01/02/2013     TRIG 173.0* 01/02/2013   HDL 33.20* 01/02/2013   LDLCALC 116* 01/02/2013   ALT 21 01/02/2013   AST 20 01/02/2013   NA 135 01/02/2013   K 4.9 01/02/2013   CL 102 01/02/2013   CREATININE 1.2 01/02/2013   BUN 21 01/02/2013   CO2 24 01/02/2013   TSH 0.51 01/07/2013   PSA 1.25 01/02/2013   HGBA1C 5.6 04/05/2010       Assessment & Plan:

## 2013-11-11 NOTE — Patient Instructions (Signed)
Otitis Media With Effusion Otitis media with effusion is the presence of fluid in the middle ear. This is a common problem in children, which often follows ear infections. It may be present for weeks or longer after the infection. Unlike an acute ear infection, otitis media with effusion refers only to fluid behind the ear drum and not infection. Children with repeated ear and sinus infections and allergy problems are the most likely to get otitis media with effusion. CAUSES  The most frequent cause of the fluid buildup is dysfunction of the eustachian tubes. These are the tubes that drain fluid in the ears to the to the back of the nose (nasopharynx). SYMPTOMS   The main symptom of this condition is hearing loss. As a result, you or your child may:  Listen to the TV at a loud volume.  Not respond to questions.  Ask "what" often when spoken to.  Mistake or confuse on sound or word for another.  There may be a sensation of fullness or pressure but usually not pain. DIAGNOSIS   Your health care provider will diagnose this condition by examining you or your child's ears.  Your health care provider may test the pressure in you or your child's ear with a tympanometer.  A hearing test may be conducted if the problem persists. TREATMENT   Treatment depends on the duration and the effects of the effusion.  Antibiotics, decongestants, nose drops, and cortisone-type drugs (tablets or nasal spray) may not be helpful.  Children with persistent ear effusions may have delayed language or behavioral problems. Children at risk for developmental delays in hearing, learning, and speech may require referral to a specialist earlier than children not at risk.  You or your child's health care provider may suggest a referral to an ear, nose, and throat surgeon for treatment. The following may help restore normal hearing:  Drainage of fluid.  Placement of ear tubes (tympanostomy tubes).  Removal of  adenoids (adenoidectomy). HOME CARE INSTRUCTIONS   Avoid second hand smoke.  Infants who are breast fed are less likely to have this condition.  Avoid feeding infants while laying flat.  Avoid known environmental allergens.  Avoid people who are sick. SEEK MEDICAL CARE IF:   Hearing is not better in 3 months.  Hearing is worse.  Ear pain.  Drainage from the ear.  Dizziness. MAKE SURE YOU:   Understand these instructions.  Will watch your condition.  Will get help right away if you are not doing well or get worse. Document Released: 09/20/2004 Document Revised: 06/03/2013 Document Reviewed: 03/10/2013 [employer] Patient Information 2014 [employer].  

## 2013-11-11 NOTE — Progress Notes (Signed)
Pre visit review using our clinic review tool, if applicable. No additional management support is needed unless otherwise documented below in the visit note. 

## 2013-11-13 ENCOUNTER — Encounter: Payer: Self-pay | Admitting: Internal Medicine

## 2013-11-13 DIAGNOSIS — N529 Male erectile dysfunction, unspecified: Secondary | ICD-10-CM

## 2013-11-13 MED ORDER — TADALAFIL 20 MG PO TABS: 20.0000 mg | ORAL_TABLET | Freq: Every day | ORAL | Status: DC | PRN

## 2013-11-19 ENCOUNTER — Other Ambulatory Visit: Payer: Self-pay | Admitting: Internal Medicine

## 2013-11-19 ENCOUNTER — Other Ambulatory Visit: Payer: Self-pay

## 2013-11-19 DIAGNOSIS — N529 Male erectile dysfunction, unspecified: Secondary | ICD-10-CM

## 2013-11-19 MED ORDER — TADALAFIL 20 MG PO TABS: 20.0000 mg | ORAL_TABLET | Freq: Every day | ORAL | Status: DC | PRN

## 2013-11-28 ENCOUNTER — Other Ambulatory Visit: Payer: Self-pay | Admitting: Internal Medicine

## 2013-12-13 ENCOUNTER — Other Ambulatory Visit: Payer: Self-pay | Admitting: Internal Medicine

## 2013-12-13 DIAGNOSIS — H6690 Otitis media, unspecified, unspecified ear: Secondary | ICD-10-CM

## 2013-12-14 ENCOUNTER — Other Ambulatory Visit: Payer: Self-pay | Admitting: Internal Medicine

## 2013-12-14 MED ORDER — AMLODIPINE BESYLATE 10 MG PO TABS: 10.0000 mg | ORAL_TABLET | Freq: Every day | ORAL | Status: DC

## 2013-12-29 ENCOUNTER — Telehealth: Payer: Self-pay | Admitting: Internal Medicine

## 2013-12-29 NOTE — Telephone Encounter (Signed)
PA request sent in for Cialis. Have PA form from insurance.

## 2014-01-01 NOTE — Telephone Encounter (Signed)
PA for Cialis denied.

## 2014-01-14 ENCOUNTER — Ambulatory Visit: Payer: BC Managed Care – PPO | Admitting: Internal Medicine

## 2014-01-15 ENCOUNTER — Ambulatory Visit (INDEPENDENT_AMBULATORY_CARE_PROVIDER_SITE_OTHER): Payer: BC Managed Care – PPO | Admitting: Internal Medicine

## 2014-01-15 ENCOUNTER — Encounter: Payer: Self-pay | Admitting: Internal Medicine

## 2014-01-15 VITALS — BP 116/70 | HR 60 | Temp 98.3°F | Resp 16 | Ht 66.0 in | Wt 210.0 lb

## 2014-01-15 DIAGNOSIS — H9319 Tinnitus, unspecified ear: Secondary | ICD-10-CM

## 2014-01-15 DIAGNOSIS — H6980 Other specified disorders of Eustachian tube, unspecified ear: Secondary | ICD-10-CM

## 2014-01-15 DIAGNOSIS — J309 Allergic rhinitis, unspecified: Secondary | ICD-10-CM | POA: Insufficient documentation

## 2014-01-15 DIAGNOSIS — H9313 Tinnitus, bilateral: Secondary | ICD-10-CM

## 2014-01-15 MED ORDER — MOMETASONE FUROATE 50 MCG/ACT NA SUSP: 2.0000 | Freq: Every day | NASAL | Status: DC

## 2014-01-15 MED ORDER — CETIRIZINE-PSEUDOEPHEDRINE ER 5-120 MG PO TB12: 1.0000 | ORAL_TABLET | Freq: Two times a day (BID) | ORAL | Status: DC

## 2014-01-15 MED ORDER — METHYLPREDNISOLONE ACETATE 80 MG/ML IJ SUSP
120.0000 mg | Freq: Once | INTRAMUSCULAR | Status: AC
  Administered 2014-01-15: 120 mg via INTRAMUSCULAR

## 2014-01-15 NOTE — Patient Instructions (Signed)
Barotitis Media  Barotitis media is inflammation of your middle ear. This occurs when the auditory tube (eustachian tube) leading from the back of your nose (nasopharynx) to your eardrum is blocked. This blockage may result from a cold, environmental allergies, or an upper respiratory infection. Unresolved barotitis media may lead to damage or hearing loss (barotrauma), which may become permanent.  HOME CARE INSTRUCTIONS   · Use medicines as recommended by your health care provider. Over-the-counter medicines will help unblock the canal and can help during times of air travel.  · Do not put anything into your ears to clean or unplug them. Eardrops will not be helpful.  · Do not swim, dive, or fly until your health care provider says it is all right to do so. If these activities are necessary, chewing gum with frequent, forceful swallowing may help. It is also helpful to hold your nose and gently blow to pop your ears for equalizing pressure changes. This forces air into the eustachian tube.  · Only take over-the-counter or prescription medicines for pain, discomfort, or fever as directed by your health care provider.  · A decongestant may be helpful in decongesting the middle ear and make pressure equalization easier.  SEEK MEDICAL CARE IF:  · You experience a serious form of dizziness in which you feel as if the room is spinning and you feel nauseated (vertigo).  · Your symptoms only involve one ear.  SEEK IMMEDIATE MEDICAL CARE IF:   · You develop a severe headache, dizziness, or severe ear pain.  · You have bloody or pus-like drainage from your ears.  · You develop a fever.  · Your problems do not improve or become worse.  MAKE SURE YOU:   · Understand these instructions.  · Will watch your condition.  · Will get help right away if you are not doing well or get worse.  Document Released: 08/10/2000 Document Revised: 06/03/2013 Document Reviewed: 03/10/2013  [employer]® Patient Information ©2014 [employer].

## 2014-01-15 NOTE — Assessment & Plan Note (Signed)
ENT referral at his request 

## 2014-01-15 NOTE — Assessment & Plan Note (Signed)
Will treat with depo-medrol IM, zyrtec-d, and nasonex ns

## 2014-01-15 NOTE — Assessment & Plan Note (Signed)
Will treat with depo-medrol IM, Nasonex NS and zyrtec-d.

## 2014-01-15 NOTE — Progress Notes (Signed)
Subjective:    Patient ID: Eugene Mccullough, male    DOB: 1961-03-23, 53 y.o.   MRN: [id number]  Sinusitis This is a recurrent problem. The current episode started 1 to 4 weeks ago. The problem has been gradually worsening since onset. There has been no fever. The fever has been present for less than 1 day. His pain is at a severity of 0/10. He is experiencing no pain. Associated symptoms include congestion, ear pain (popping in his right ear) and sneezing. Pertinent negatives include no chills, coughing, diaphoresis, headaches, hoarse voice, neck pain, shortness of breath, sinus pressure or sore throat. Past treatments include nothing. The treatment provided no relief.      Review of Systems  Constitutional: Negative.  Negative for chills and diaphoresis.  HENT: Positive for congestion, ear pain (popping in his right ear), postnasal drip, rhinorrhea, sneezing and tinnitus. Negative for dental problem, drooling, ear discharge, facial swelling, hearing loss, hoarse voice, mouth sores, nosebleeds, sinus pressure, sore throat, trouble swallowing and voice change.   Eyes: Negative.   Respiratory: Negative.  Negative for cough and shortness of breath.   Cardiovascular: Negative.  Negative for chest pain, palpitations and leg swelling.  Gastrointestinal: Negative.  Negative for nausea, vomiting, abdominal pain, diarrhea, constipation and blood in stool.  Endocrine: Negative.   Genitourinary: Negative.   Musculoskeletal: Negative.  Negative for neck pain.  Skin: Negative.   Allergic/Immunologic: Negative.   Neurological: Negative.  Negative for headaches.  Hematological: Negative.  Negative for adenopathy. Does not bruise/bleed easily.  Psychiatric/Behavioral: Negative.        Objective:   Physical Exam  Vitals reviewed. Constitutional: He is oriented to person, place, and time. He appears well-developed and well-nourished. No distress.  HENT:  Head: Normocephalic and atraumatic.  Right  Ear: Hearing, tympanic membrane, external ear and ear canal normal.  Left Ear: Hearing, tympanic membrane, external ear and ear canal normal.  Nose: Mucosal edema and rhinorrhea present. No sinus tenderness, nasal deformity or nasal septal hematoma.  No foreign bodies. Right sinus exhibits no maxillary sinus tenderness and no frontal sinus tenderness. Left sinus exhibits no maxillary sinus tenderness and no frontal sinus tenderness.  Mouth/Throat: Uvula is midline, oropharynx is clear and moist and mucous membranes are normal. Mucous membranes are not pale, not dry and not cyanotic. No oral lesions. No trismus in the jaw. No uvula swelling. No oropharyngeal exudate, posterior oropharyngeal edema, posterior oropharyngeal erythema or tonsillar abscesses.  Eyes: Conjunctivae are normal. Right eye exhibits no discharge. Left eye exhibits no discharge. No scleral icterus.  Neck: Normal range of motion. Neck supple. No JVD present. No tracheal deviation present. No thyromegaly present.  Cardiovascular: Normal rate, regular rhythm, normal heart sounds and intact distal pulses.  Exam reveals no gallop and no friction rub.   No murmur heard. Pulmonary/Chest: Effort normal and breath sounds normal. No stridor. No respiratory distress. He has no wheezes. He has no rales. He exhibits no tenderness.  Abdominal: Soft. Bowel sounds are normal. He exhibits no distension and no mass. There is no tenderness. There is no rebound and no guarding.  Musculoskeletal: Normal range of motion. He exhibits no edema and no tenderness.  Lymphadenopathy:    He has no cervical adenopathy.  Neurological: He is oriented to person, place, and time.  Skin: Skin is warm and dry. No rash noted. He is not diaphoretic. No erythema. No pallor.  Psychiatric: He has a normal mood and affect. His behavior is normal. Judgment and thought content  normal.     Lab Results  Component Value Date   WBC 7.6 01/02/2013   HGB 15.1 01/02/2013   HCT  44.6 01/02/2013   PLT 193.0 01/02/2013   GLUCOSE 74 01/02/2013   CHOL 184 01/02/2013   TRIG 173.0* 01/02/2013   HDL 33.20* 01/02/2013   LDLCALC 116* 01/02/2013   ALT 21 01/02/2013   AST 20 01/02/2013   NA 135 01/02/2013   K 4.9 01/02/2013   CL 102 01/02/2013   CREATININE 1.2 01/02/2013   BUN 21 01/02/2013   CO2 24 01/02/2013   TSH 0.51 01/07/2013   PSA 1.25 01/02/2013   HGBA1C 5.6 04/05/2010       Assessment & Plan:

## 2014-01-15 NOTE — Progress Notes (Signed)
Pre visit review using our clinic review tool, if applicable. No additional management support is needed unless otherwise documented below in the visit note. 

## 2014-02-26 ENCOUNTER — Other Ambulatory Visit: Payer: Self-pay | Admitting: Internal Medicine

## 2014-02-26 ENCOUNTER — Encounter: Payer: Self-pay | Admitting: Internal Medicine

## 2014-03-01 ENCOUNTER — Encounter: Payer: Self-pay | Admitting: Internal Medicine

## 2014-03-01 NOTE — Telephone Encounter (Signed)
Rx has been added to chart

## 2014-05-18 ENCOUNTER — Encounter: Payer: Self-pay | Admitting: Internal Medicine

## 2014-05-18 ENCOUNTER — Ambulatory Visit (INDEPENDENT_AMBULATORY_CARE_PROVIDER_SITE_OTHER): Payer: BC Managed Care – PPO | Admitting: Internal Medicine

## 2014-05-18 ENCOUNTER — Other Ambulatory Visit (INDEPENDENT_AMBULATORY_CARE_PROVIDER_SITE_OTHER): Payer: BC Managed Care – PPO

## 2014-05-18 ENCOUNTER — Ambulatory Visit (INDEPENDENT_AMBULATORY_CARE_PROVIDER_SITE_OTHER)
Admission: RE | Admit: 2014-05-18 | Discharge: 2014-05-18 | Disposition: A | Discharge status: Home / Self Care | Payer: BC Managed Care – PPO | Source: Ambulatory Visit | Attending: Internal Medicine | Admitting: Internal Medicine

## 2014-05-18 VITALS — BP 116/70 | HR 68 | Temp 98.3°F | Resp 16 | Ht 66.0 in | Wt 204.0 lb

## 2014-05-18 DIAGNOSIS — R1013 Epigastric pain: Secondary | ICD-10-CM

## 2014-05-18 DIAGNOSIS — R197 Diarrhea, unspecified: Secondary | ICD-10-CM

## 2014-05-18 LAB — CBC WITH DIFFERENTIAL/PLATELET
BASOS ABS: 0.1 10*3/uL (ref 0.0–0.1)
BASOS PCT: 0.8 % (ref 0.0–3.0)
EOS ABS: 0.1 10*3/uL (ref 0.0–0.7)
Eosinophils Relative: 2.1 % (ref 0.0–5.0)
HEMATOCRIT: 41.6 % (ref 39.0–52.0)
HEMOGLOBIN: 13.9 g/dL (ref 13.0–17.0)
LYMPHS ABS: 2 10*3/uL (ref 0.7–4.0)
LYMPHS PCT: 29.5 % (ref 12.0–46.0)
MCHC: 33.5 g/dL (ref 30.0–36.0)
MCV: 84.5 fl (ref 78.0–100.0)
Monocytes Absolute: 0.6 10*3/uL (ref 0.1–1.0)
Monocytes Relative: 8.9 % (ref 3.0–12.0)
NEUTROS ABS: 3.9 10*3/uL (ref 1.4–7.7)
Neutrophils Relative %: 58.7 % (ref 43.0–77.0)
Platelets: 193 10*3/uL (ref 150.0–400.0)
RBC: 4.92 Mil/uL (ref 4.22–5.81)
RDW: 14.8 % (ref 11.5–15.5)
WBC: 6.7 10*3/uL (ref 4.0–10.5)

## 2014-05-18 LAB — TSH: TSH: 0.38 u[IU]/mL (ref 0.35–4.50)

## 2014-05-18 LAB — COMPREHENSIVE METABOLIC PANEL
ALT: 23 U/L (ref 0–53)
AST: 23 U/L (ref 0–37)
Albumin: 3.8 g/dL (ref 3.5–5.2)
Alkaline Phosphatase: 74 U/L (ref 39–117)
BILIRUBIN TOTAL: 0.5 mg/dL (ref 0.2–1.2)
BUN: 14 mg/dL (ref 6–23)
CALCIUM: 9.5 mg/dL (ref 8.4–10.5)
CHLORIDE: 101 meq/L (ref 96–112)
CO2: 28 mEq/L (ref 19–32)
CREATININE: 1 mg/dL (ref 0.4–1.5)
GFR: 81 mL/min (ref >60.00)
Glucose, Bld: 92 mg/dL (ref 70–99)
Potassium: 4.7 mEq/L (ref 3.5–5.1)
Sodium: 135 mEq/L (ref 135–145)
Total Protein: 8.5 g/dL — ABNORMAL HIGH (ref 6.0–8.3)

## 2014-05-18 LAB — URINALYSIS, ROUTINE W REFLEX MICROSCOPIC
BILIRUBIN URINE: NEGATIVE
Hgb urine dipstick: NEGATIVE
LEUKOCYTES UA: NEGATIVE
NITRITE: NEGATIVE
RBC / HPF: NONE SEEN (ref 0–?)
Specific Gravity, Urine: 1.02 (ref 1.000–1.030)
Total Protein, Urine: NEGATIVE
UROBILINOGEN UA: 0.2 (ref 0.0–1.0)
Urine Glucose: NEGATIVE
WBC, UA: NONE SEEN (ref 0–?)
pH: 6 (ref 5.0–8.0)

## 2014-05-18 LAB — AMYLASE: Amylase: 91 U/L (ref 27–131)

## 2014-05-18 LAB — LIPASE: LIPASE: 26 U/L (ref 11.0–59.0)

## 2014-05-18 LAB — SEDIMENTATION RATE: Sed Rate: 27 mm/hr — ABNORMAL HIGH (ref 0–22)

## 2014-05-18 NOTE — Progress Notes (Signed)
Subjective:    Patient ID: Eugene Mccullough, male    DOB: [birth year], 53 y.o.   MRN: [id number]  Diarrhea  This is a recurrent problem. The current episode started 1 to 4 weeks ago. The problem occurs 2 to 4 times per day. The problem has been unchanged. The stool consistency is described as watery. Associated symptoms include abdominal pain ("sharp" pain in epigastric area that radiates into his back, also has pain in BLQ's). Pertinent negatives include no arthralgias, bloating, chills, coughing, fever, myalgias or vomiting. Nothing aggravates the symptoms. He has tried nothing for the symptoms. The treatment provided no relief. His past medical history is significant for irritable bowel syndrome. There is no history of bowel resection, inflammatory bowel disease, malabsorption, a recent abdominal surgery or short gut syndrome.      Review of Systems  Constitutional: Negative.  Negative for fever, chills, diaphoresis, appetite change and fatigue.  HENT: Negative.   Eyes: Negative.   Respiratory: Negative.  Negative for cough, choking, chest tightness, shortness of breath and stridor.   Cardiovascular: Negative.  Negative for chest pain, palpitations and leg swelling.  Gastrointestinal: Positive for abdominal pain ("sharp" pain in epigastric area that radiates into his back, also has pain in BLQ's) and diarrhea. Negative for nausea, vomiting, constipation, abdominal distention, anal bleeding, rectal pain and bloating.  Endocrine: Negative.   Genitourinary: Negative.  Negative for frequency, flank pain, enuresis and difficulty urinating.  Musculoskeletal: Negative.  Negative for arthralgias, back pain, joint swelling, myalgias and neck stiffness.  Skin: Negative.  Negative for color change, pallor and rash.  Allergic/Immunologic: Negative.   Neurological: Negative.   Hematological: Negative.  Negative for adenopathy. Does not bruise/bleed easily.  Psychiatric/Behavioral: Negative.          Objective:   Physical Exam  Vitals reviewed. Constitutional: He is oriented to person, place, and time. He appears well-developed and well-nourished.  Non-toxic appearance. He does not have a sickly appearance. He does not appear ill. No distress.  HENT:  Head: Normocephalic and atraumatic.  Mouth/Throat: Oropharynx is clear and moist. No oropharyngeal exudate.  Eyes: Conjunctivae are normal. Right eye exhibits no discharge. Left eye exhibits no discharge. No scleral icterus.  Neck: Normal range of motion. Neck supple. No JVD present. No tracheal deviation present. No thyromegaly present.  Cardiovascular: Normal rate, regular rhythm, normal heart sounds and intact distal pulses.  Exam reveals no gallop and no friction rub.   No murmur heard. Pulmonary/Chest: Effort normal and breath sounds normal. No stridor. No respiratory distress. He has no wheezes. He has no rales. He exhibits no tenderness.  Abdominal: Soft. Normal appearance. He exhibits no shifting dullness, no distension, no pulsatile liver, no fluid wave, no abdominal bruit, no ascites, no pulsatile midline mass and no mass. Bowel sounds are decreased. There is no hepatosplenomegaly, splenomegaly or hepatomegaly. There is tenderness in the right upper quadrant, right lower quadrant and left lower quadrant. There is no rigidity, no rebound, no guarding, no CVA tenderness, no tenderness at McBurney's point and negative Murphy's sign. No hernia. Hernia confirmed negative in the ventral area, confirmed negative in the right inguinal area and confirmed negative in the left inguinal area.  Musculoskeletal: Normal range of motion. He exhibits no edema and no tenderness.  Lymphadenopathy:    He has no cervical adenopathy.  Neurological: He is oriented to person, place, and time.  Skin: Skin is warm and dry. No rash noted. He is not diaphoretic. No erythema. No pallor.  Lab Results  Component Value Date   WBC 7.6 01/02/2013   HGB 15.1  01/02/2013   HCT 44.6 01/02/2013   PLT 193.0 01/02/2013   GLUCOSE 74 01/02/2013   CHOL 184 01/02/2013   TRIG 173.0* 01/02/2013   HDL 33.20* 01/02/2013   LDLCALC 116* 01/02/2013   ALT 21 01/02/2013   AST 20 01/02/2013   NA 135 01/02/2013   K 4.9 01/02/2013   CL 102 01/02/2013   CREATININE 1.2 01/02/2013   BUN 21 01/02/2013   CO2 24 01/02/2013   TSH 0.51 01/07/2013   PSA 1.25 01/02/2013   HGBA1C 5.6 04/05/2010       Assessment & Plan:

## 2014-05-18 NOTE — Progress Notes (Signed)
Pre visit review using our clinic review tool, if applicable. No additional management support is needed unless otherwise documented below in the visit note. 

## 2014-05-18 NOTE — Patient Instructions (Signed)

## 2014-05-19 LAB — GLIADIN ANTIBODIES, SERUM
Gliadin IgA: 21.1 U/mL — ABNORMAL HIGH (ref <20)
Gliadin IgG: 14.5 U/mL (ref <20)

## 2014-05-19 LAB — TISSUE TRANSGLUTAMINASE, IGA: Tissue Transglutaminase Ab, IgA: 16.4 U/mL (ref <20)

## 2014-05-19 LAB — FECAL OCCULT BLOOD, GUAIAC: Fecal Occult Blood: NEGATIVE

## 2014-05-19 NOTE — Assessment & Plan Note (Signed)
I will check his labs to look for organic causes of diarrhea and will test for celiac disease

## 2014-05-19 NOTE — Assessment & Plan Note (Signed)
His exam is benign Labs do not show any acute organic process Plain films are benign Since the pain radiates into his back I will get a CT scan done to look for AAA, mass, etc.

## 2014-05-21 ENCOUNTER — Encounter: Payer: Self-pay | Admitting: Internal Medicine

## 2014-05-21 LAB — RETICULIN ANTIBODIES, IGA W TITER: RETICULIN AB, IGA: NEGATIVE

## 2014-06-08 ENCOUNTER — Other Ambulatory Visit: Payer: Self-pay

## 2014-06-08 MED ORDER — METOPROLOL TARTRATE 50 MG PO TABS: ORAL_TABLET | ORAL | Status: DC

## 2014-07-01 ENCOUNTER — Encounter: Payer: Self-pay | Admitting: Internal Medicine

## 2014-07-01 ENCOUNTER — Ambulatory Visit (INDEPENDENT_AMBULATORY_CARE_PROVIDER_SITE_OTHER): Payer: BC Managed Care – PPO | Admitting: Internal Medicine

## 2014-07-01 ENCOUNTER — Ambulatory Visit (INDEPENDENT_AMBULATORY_CARE_PROVIDER_SITE_OTHER)
Admission: RE | Admit: 2014-07-01 | Discharge: 2014-07-01 | Disposition: A | Discharge status: Home / Self Care | Payer: BC Managed Care – PPO | Source: Ambulatory Visit | Attending: Internal Medicine | Admitting: Internal Medicine

## 2014-07-01 VITALS — BP 120/64 | HR 78 | Temp 98.3°F | Resp 16 | Ht 66.0 in | Wt 210.0 lb

## 2014-07-01 DIAGNOSIS — R1013 Epigastric pain: Secondary | ICD-10-CM

## 2014-07-01 DIAGNOSIS — K5909 Other constipation: Secondary | ICD-10-CM

## 2014-07-01 DIAGNOSIS — I10 Essential (primary) hypertension: Secondary | ICD-10-CM

## 2014-07-01 MED ORDER — LINACLOTIDE 290 MCG PO CAPS: 290.0000 ug | ORAL_CAPSULE | Freq: Every day | ORAL | Status: DC

## 2014-07-01 NOTE — Progress Notes (Signed)
Pre visit review using our clinic review tool, if applicable. No additional management support is needed unless otherwise documented below in the visit note. 

## 2014-07-01 NOTE — Patient Instructions (Signed)

## 2014-07-01 NOTE — Progress Notes (Signed)
Subjective:    Patient ID: Eugene Mccullough, male    DOB: Mar 28, 1961, 53 y.o.   MRN: [id number]  Abdominal Pain This is a recurrent problem. The current episode started 1 to 4 weeks ago. The onset quality is gradual. The problem occurs intermittently. The most recent episode lasted 3 weeks. The problem has been unchanged. The pain is located in the generalized abdominal region. The pain is at a severity of 1/10. The pain is mild. The quality of the pain is dull. The abdominal pain does not radiate. Associated symptoms include constipation. Pertinent negatives include no anorexia, arthralgias, belching, diarrhea, dysuria, fever, flatus, frequency, headaches, hematochezia, hematuria, melena, myalgias, nausea, vomiting or weight loss. Nothing aggravates the pain. The pain is relieved by bowel movements. He has tried proton pump inhibitors for the symptoms. His past medical history is significant for GERD and irritable bowel syndrome. There is no history of abdominal surgery, colon cancer, Crohn's disease, gallstones, pancreatitis, PUD or ulcerative colitis.      Review of Systems  Constitutional: Negative.  Negative for fever, chills, weight loss, diaphoresis, appetite change and fatigue.  HENT: Negative.   Eyes: Negative.   Respiratory: Negative.  Negative for cough, choking, chest tightness, shortness of breath, wheezing and stridor.   Cardiovascular: Negative.  Negative for chest pain, palpitations and leg swelling.  Gastrointestinal: Positive for abdominal pain and constipation. Negative for nausea, vomiting, diarrhea, blood in stool, melena, hematochezia, abdominal distention, anal bleeding, rectal pain, anorexia and flatus.  Endocrine: Negative.   Genitourinary: Negative.  Negative for dysuria, frequency and hematuria.  Musculoskeletal: Negative.  Negative for myalgias, back pain and arthralgias.  Skin: Negative.  Negative for rash.  Allergic/Immunologic: Negative.   Neurological: Negative.   Negative for headaches.  Hematological: Negative.  Negative for adenopathy. Does not bruise/bleed easily.  Psychiatric/Behavioral: Negative.        Objective:   Physical Exam  Constitutional: He is oriented to person, place, and time. He appears well-developed and well-nourished. No distress.  HENT:  Head: Normocephalic and atraumatic.  Mouth/Throat: Oropharynx is clear and moist. No oropharyngeal exudate.  Eyes: Conjunctivae are normal. Right eye exhibits no discharge. Left eye exhibits no discharge. No scleral icterus.  Neck: Normal range of motion. Neck supple. No JVD present. No tracheal deviation present. No thyromegaly present.  Cardiovascular: Normal rate, regular rhythm, normal heart sounds and intact distal pulses.  Exam reveals no gallop and no friction rub.   No murmur heard. Pulmonary/Chest: Effort normal and breath sounds normal. No stridor. No respiratory distress. He has no wheezes. He has no rales. He exhibits no tenderness.  Abdominal: Soft. Normal appearance. He exhibits no shifting dullness, no distension, no pulsatile liver, no fluid wave, no abdominal bruit, no ascites, no pulsatile midline mass and no mass. Bowel sounds are decreased. There is no hepatosplenomegaly, splenomegaly or hepatomegaly. There is no tenderness. There is no rebound, no guarding, no CVA tenderness, no tenderness at McBurney's point and negative Murphy's sign. No hernia. Hernia confirmed negative in the ventral area, confirmed negative in the right inguinal area and confirmed negative in the left inguinal area.  Musculoskeletal: Normal range of motion. He exhibits no edema or tenderness.  Lymphadenopathy:    He has no cervical adenopathy.  Neurological: He is oriented to person, place, and time.  Skin: Skin is warm and dry. No rash noted. He is not diaphoretic. No erythema. No pallor.  Vitals reviewed.     Lab Results  Component Value Date   WBC 6.7  05/18/2014   HGB 13.9 05/18/2014   HCT  41.6 05/18/2014   PLT 193.0 05/18/2014   GLUCOSE 92 05/18/2014   CHOL 184 01/02/2013   TRIG 173.0* 01/02/2013   HDL 33.20* 01/02/2013   LDLCALC 116* 01/02/2013   ALT 23 05/18/2014   AST 23 05/18/2014   NA 135 05/18/2014   K 4.7 05/18/2014   CL 101 05/18/2014   CREATININE 1.0 05/18/2014   BUN 14 05/18/2014   CO2 28 05/18/2014   TSH 0.38 05/18/2014   PSA 1.25 01/02/2013   HGBA1C 5.6 04/05/2010      Assessment & Plan:

## 2014-07-04 NOTE — Assessment & Plan Note (Signed)
His recent labs do not show any secondary causes for constipation Will treat with linzess

## 2014-07-04 NOTE — Assessment & Plan Note (Signed)
His BP is well controlled 

## 2014-07-04 NOTE — Assessment & Plan Note (Signed)
His ROS and exam today do not suggest an acute bd process His plain films show a moderate amount of stool Will treat the constipation

## 2014-07-05 ENCOUNTER — Ambulatory Visit: Payer: BC Managed Care – PPO | Admitting: Internal Medicine

## 2014-08-05 ENCOUNTER — Telehealth: Payer: Self-pay | Admitting: Family

## 2014-08-05 ENCOUNTER — Other Ambulatory Visit (INDEPENDENT_AMBULATORY_CARE_PROVIDER_SITE_OTHER): Payer: BC Managed Care – PPO

## 2014-08-05 ENCOUNTER — Encounter: Payer: Self-pay | Admitting: Family

## 2014-08-05 ENCOUNTER — Ambulatory Visit (INDEPENDENT_AMBULATORY_CARE_PROVIDER_SITE_OTHER): Payer: BC Managed Care – PPO | Admitting: Family

## 2014-08-05 VITALS — BP 120/72 | HR 64 | Temp 98.1°F | Resp 18 | Ht 67.0 in | Wt 212.0 lb

## 2014-08-05 DIAGNOSIS — R1013 Epigastric pain: Secondary | ICD-10-CM

## 2014-08-05 LAB — LIPASE: Lipase: 29 U/L (ref 11.0–59.0)

## 2014-08-05 LAB — H. PYLORI ANTIBODY, IGG: H Pylori IgG: POSITIVE — AB

## 2014-08-05 MED ORDER — SUCRALFATE 1 G PO TABS: 1.0000 g | ORAL_TABLET | Freq: Three times a day (TID) | ORAL | Status: DC

## 2014-08-05 MED ORDER — AMOXICILLIN 500 MG PO CAPS: 1000.0000 mg | ORAL_CAPSULE | Freq: Two times a day (BID) | ORAL | Status: DC

## 2014-08-05 MED ORDER — CLARITHROMYCIN 500 MG PO TABS: 500.0000 mg | ORAL_TABLET | Freq: Two times a day (BID) | ORAL | Status: DC

## 2014-08-05 NOTE — Assessment & Plan Note (Signed)
Epigastric pain potentially related to peptic ulcer. Obtain H. Pylori and lipase. Start carafate to assist with potential ulcer prevention. Pending lab results, continue omeprazole 40 mg daily. Follow up if symptoms worsen or fail to improve.

## 2014-08-05 NOTE — Progress Notes (Signed)
Pre visit review using our clinic review tool, if applicable. No additional management support is needed unless otherwise documented below in the visit note. 

## 2014-08-05 NOTE — Progress Notes (Signed)
   Subjective:    Patient ID: Eugene Mccullough, male    DOB: June 05, 1961, 53 y.o.   MRN: [id number]  Chief Complaint  Patient presents with  . Abdominal Pain    same pain he had last visit he had, said he was tested for h. pylori 5 years ago when he had the same stomach problem and it was positive    HPI:  Eugene Mccullough is a 53 y.o. male who presents today for an acute visit.  Acute symptoms of epigastric stomach pain for about a month. Describes the pain as sometimes sharp and sometimes dull. Indicates food makes it feel better. Has not tried any additional treatments.   Was previously seen for epigastric pain that was believed to constipation at that time. He was started on Linzess and has since discontinued the medication secondary to diarrhea.  Allergies  Allergen Reactions  . Hydrocodone-Acetaminophen Itching   Current Outpatient Prescriptions on File Prior to Visit  Medication Sig Dispense Refill  . amLODipine (NORVASC) 10 MG tablet Take 1 tablet (10 mg total) by mouth daily. 90 tablet 3  . finasteride (PROSCAR) 5 MG tablet Take 5 mg by mouth daily.    . Linaclotide (LINZESS) 290 MCG CAPS capsule Take 1 capsule (290 mcg total) by mouth daily. 90 capsule 3  . lisinopril (PRINIVIL,ZESTRIL) 20 MG tablet TAKE 1 TABLET (20 MG TOTAL) BY MOUTH 2 (TWO) TIMES DAILY. 180 tablet 3  . metoprolol (LOPRESSOR) 50 MG tablet TAKE 1 TABLET (50 MG TOTAL) BY MOUTH 2 (TWO) TIMES DAILY. 180 tablet 3  . omeprazole (PRILOSEC) 40 MG capsule Take 40 mg by mouth daily.       No current facility-administered medications on file prior to visit.     Review of Systems    See HPI  Objective:    BP 120/72 mmHg  Pulse 64  Temp(Src) 98.1 F (36.7 C) (Oral)  Resp 18  Ht 5\' 7"  (1.702 m)  Wt 212 lb (96.163 kg)  BMI 33.20 kg/m2  SpO2 98% Nursing note and vital signs reviewed.  Physical Exam  Constitutional: He is oriented to person, place, and time. He appears well-developed and well-nourished. No  distress.  Cardiovascular: Normal rate, regular rhythm, normal heart sounds and intact distal pulses.   Pulmonary/Chest: Effort normal and breath sounds normal.  Abdominal: Normal appearance and bowel sounds are normal. He exhibits no distension, no ascites and no mass. There is no hepatosplenomegaly. There is tenderness in the epigastric area. There is no rigidity, no rebound, no guarding, no CVA tenderness, no tenderness at McBurney's point and negative Murphy's sign.  Neurological: He is alert and oriented to person, place, and time.  Skin: Skin is warm and dry.  Psychiatric: He has a normal mood and affect. His behavior is normal. Judgment and thought content normal.       Assessment & Plan:

## 2014-08-05 NOTE — Telephone Encounter (Signed)
Pt aware.

## 2014-08-05 NOTE — Telephone Encounter (Signed)
Please call informed patient that his H. Pylori test came back positive. I have sent to antibiotics to his pharmacy for him to start taking. He'll take amoxicillin 2 pills twice daily for 10 days and clarithromycin one pill twice daily for 10 days. Can also please continue the omeprazole.

## 2014-08-05 NOTE — Telephone Encounter (Signed)
Called pt and left message for him to call back

## 2014-08-05 NOTE — Patient Instructions (Addendum)
Thank you for choosing [facility].  Summary/Instructions:  Your prescription(s) have been submitted to your pharmacy. Please take as directed and contact our office if you believe you are having problem(s) with the medication(s).  Please stop by the lab on the basement level of the building for your blood work. Your results will be released to [facility] (or called to you) after review, usually within 72hours after test completion. If any changes need to be made, you will be notified at that same time.  If your symptoms worsen or fail to improve, please contact our office for further instruction, or in case of emergency go directly to the emergency room at the closest medical facility.   Food Choices for Peptic Ulcer Disease When you have peptic ulcer disease, the foods you eat and your eating habits are very important. Choosing the right foods can help ease the discomfort of peptic ulcer disease. WHAT GENERAL GUIDELINES DO I NEED TO FOLLOW?  Choose fruits, vegetables, whole grains, and low-fat meat, fish, and poultry.   Keep a food diary to identify foods that cause symptoms.  Avoid foods that cause irritation or pain. These may be different for different people.  Eat frequent small meals instead of three large meals each day. The pain may be worse when your stomach is empty.  Avoid eating close to bedtime. WHAT FOODS ARE NOT RECOMMENDED? The following are some foods and drinks that may worsen your symptoms:  Black, white, and red pepper.  Hot sauce.  Chili peppers.  Chili powder.  Chocolate and cocoa.   Alcohol.  Tea, coffee, and cola (regular and decaffeinated). The items listed above may not be a complete list of foods and beverages to avoid. Contact your dietitian for more information. Document Released: 11/05/2011 Document Revised: 08/18/2013 Document Reviewed: 06/17/2013 [facility] Patient Information 2015 [place], [place]. This information is not intended to  replace advice given to you by your health care provider. Make sure you discuss any questions you have with your health care provider.

## 2014-09-13 ENCOUNTER — Ambulatory Visit (INDEPENDENT_AMBULATORY_CARE_PROVIDER_SITE_OTHER): Payer: 59 | Admitting: Internal Medicine

## 2014-09-13 ENCOUNTER — Encounter: Payer: Self-pay | Admitting: Internal Medicine

## 2014-09-13 VITALS — BP 118/72 | HR 76 | Temp 98.0°F | Resp 16 | Ht 67.0 in | Wt 216.0 lb

## 2014-09-13 DIAGNOSIS — K589 Irritable bowel syndrome without diarrhea: Secondary | ICD-10-CM

## 2014-09-13 DIAGNOSIS — R1013 Epigastric pain: Secondary | ICD-10-CM

## 2014-09-13 MED ORDER — ELUXADOLINE 100 MG PO TABS: 1.0000 | ORAL_TABLET | Freq: Two times a day (BID) | ORAL | Status: DC

## 2014-09-13 NOTE — Patient Instructions (Signed)

## 2014-09-13 NOTE — Progress Notes (Signed)
Subjective:    Patient ID: Eugene Mccullough, male    DOB: 08-Sep-1960, 54 y.o.   MRN: [id number]  Abdominal Pain This is a recurrent problem. The current episode started more than 1 year ago. The onset quality is gradual. The problem occurs intermittently. The problem has been gradually worsening. The pain is located in the periumbilical region, epigastric region and suprapubic region. The pain is at a severity of 2/10. The pain is mild. The quality of the pain is aching. The abdominal pain does not radiate. Associated symptoms include diarrhea, flatus and nausea. Pertinent negatives include no anorexia, arthralgias, belching, constipation, dysuria, fever, frequency, headaches, hematochezia, hematuria, melena, myalgias, vomiting or weight loss. Nothing aggravates the pain. The pain is relieved by bowel movements. He has tried proton pump inhibitors for the symptoms. The treatment provided no relief. His past medical history is significant for GERD and irritable bowel syndrome. There is no history of abdominal surgery, colon cancer, Crohn's disease, gallstones, pancreatitis, PUD or ulcerative colitis.      Review of Systems  Constitutional: Positive for unexpected weight change (some wt gain). Negative for fever, chills, weight loss, diaphoresis, appetite change and fatigue.  HENT: Negative.   Eyes: Negative.   Respiratory: Negative.  Negative for cough, choking, chest tightness, shortness of breath and stridor.   Cardiovascular: Negative.  Negative for chest pain, palpitations and leg swelling.  Gastrointestinal: Positive for nausea, abdominal pain, diarrhea and flatus. Negative for vomiting, constipation, blood in stool, melena, hematochezia, abdominal distention, anal bleeding, rectal pain and anorexia.  Endocrine: Negative.   Genitourinary: Negative.  Negative for dysuria, urgency, frequency, hematuria, flank pain, decreased urine volume, discharge, penile swelling, scrotal swelling, enuresis,  difficulty urinating, genital sores, penile pain and testicular pain.  Musculoskeletal: Negative.  Negative for myalgias and arthralgias.  Skin: Negative.   Allergic/Immunologic: Negative.   Neurological: Negative.  Negative for dizziness, weakness, light-headedness and headaches.  Hematological: Negative.  Negative for adenopathy. Does not bruise/bleed easily.  Psychiatric/Behavioral: Negative.        Objective:   Physical Exam  Constitutional: He is oriented to person, place, and time. He appears well-developed and well-nourished. No distress.  HENT:  Head: Normocephalic and atraumatic.  Mouth/Throat: Oropharynx is clear and moist. No oropharyngeal exudate.  Eyes: Conjunctivae are normal. Right eye exhibits no discharge. Left eye exhibits no discharge. No scleral icterus.  Neck: Normal range of motion. Neck supple. No JVD present. No tracheal deviation present. No thyromegaly present.  Cardiovascular: Normal rate, regular rhythm, normal heart sounds and intact distal pulses.  Exam reveals no gallop and no friction rub.   No murmur heard. Pulmonary/Chest: Effort normal and breath sounds normal. No stridor. No respiratory distress. He has no wheezes. He has no rales. He exhibits no tenderness.  Abdominal: Soft. Normal appearance and bowel sounds are normal. He exhibits no distension, no abdominal bruit, no pulsatile midline mass and no mass. There is no hepatosplenomegaly, splenomegaly or hepatomegaly. There is tenderness in the right upper quadrant, epigastric area, periumbilical area, suprapubic area, left upper quadrant and left lower quadrant. There is no rigidity, no rebound, no guarding, no CVA tenderness, no tenderness at McBurney's point and negative Murphy's sign. No hernia. Hernia confirmed negative in the ventral area, confirmed negative in the right inguinal area and confirmed negative in the left inguinal area.  Musculoskeletal: Normal range of motion. He exhibits no edema or  tenderness.  Lymphadenopathy:    He has no cervical adenopathy.  Neurological: He is oriented to person,  place, and time.  Skin: Skin is warm and dry. No rash noted. He is not diaphoretic. No erythema. No pallor.  Psychiatric: He has a normal mood and affect. His behavior is normal. Judgment and thought content normal.  Vitals reviewed.    Lab Results  Component Value Date   WBC 6.7 05/18/2014   HGB 13.9 05/18/2014   HCT 41.6 05/18/2014   PLT 193.0 05/18/2014   GLUCOSE 92 05/18/2014   CHOL 184 01/02/2013   TRIG 173.0* 01/02/2013   HDL 33.20* 01/02/2013   LDLCALC 116* 01/02/2013   ALT 23 05/18/2014   AST 23 05/18/2014   NA 135 05/18/2014   K 4.7 05/18/2014   CL 101 05/18/2014   CREATININE 1.0 05/18/2014   BUN 14 05/18/2014   CO2 28 05/18/2014   TSH 0.38 05/18/2014   PSA 1.25 01/02/2013   HGBA1C 5.6 04/05/2010       Assessment & Plan:

## 2014-09-13 NOTE — Progress Notes (Signed)
Pre visit review using our clinic review tool, if applicable. No additional management support is needed unless otherwise documented below in the visit note. 

## 2014-09-14 NOTE — Assessment & Plan Note (Signed)
He did not benefit from linzess so will try viberzi

## 2014-09-14 NOTE — Assessment & Plan Note (Signed)
CT scan that was previously ordered has not been done I want to see if there is a mass lesion in the abd to explain his pain so will reorder it He has GERD so will refer to GI to see if upper endoscopy is indicated He was treated for IBS-C with linzess but he does not think that helped and now he complains of diarrhea Will try Viberzi for the pain and diarrhea

## 2014-09-24 ENCOUNTER — Other Ambulatory Visit: Payer: Self-pay | Admitting: Internal Medicine

## 2014-09-24 ENCOUNTER — Encounter: Payer: Self-pay | Admitting: Internal Medicine

## 2014-09-24 DIAGNOSIS — R1013 Epigastric pain: Secondary | ICD-10-CM

## 2014-09-27 ENCOUNTER — Other Ambulatory Visit: Payer: Self-pay | Admitting: Internal Medicine

## 2014-09-27 ENCOUNTER — Encounter: Payer: Self-pay | Admitting: Internal Medicine

## 2014-09-27 DIAGNOSIS — K589 Irritable bowel syndrome without diarrhea: Secondary | ICD-10-CM

## 2014-09-27 MED ORDER — HYOSCYAMINE SULFATE ER 0.375 MG PO TB12: 0.3750 mg | ORAL_TABLET | Freq: Two times a day (BID) | ORAL | Status: DC

## 2014-10-07 ENCOUNTER — Ambulatory Visit (INDEPENDENT_AMBULATORY_CARE_PROVIDER_SITE_OTHER)
Admission: RE | Admit: 2014-10-07 | Discharge: 2014-10-07 | Disposition: A | Discharge status: Home / Self Care | Payer: 59 | Source: Ambulatory Visit | Attending: Internal Medicine | Admitting: Internal Medicine

## 2014-10-07 DIAGNOSIS — R1013 Epigastric pain: Secondary | ICD-10-CM

## 2014-10-07 MED ORDER — IOHEXOL 300 MG/ML  SOLN
100.0000 mL | Freq: Once | INTRAMUSCULAR | Status: AC | PRN
  Administered 2014-10-07: 100 mL via INTRAVENOUS

## 2014-10-10 ENCOUNTER — Encounter: Payer: Self-pay | Admitting: Internal Medicine

## 2014-10-25 ENCOUNTER — Other Ambulatory Visit: Payer: Self-pay | Admitting: Internal Medicine

## 2014-10-25 DIAGNOSIS — K589 Irritable bowel syndrome without diarrhea: Secondary | ICD-10-CM

## 2014-10-25 MED ORDER — ELUXADOLINE 75 MG PO TABS: 1.0000 | ORAL_TABLET | Freq: Two times a day (BID) | ORAL | Status: DC

## 2014-10-28 ENCOUNTER — Ambulatory Visit (INDEPENDENT_AMBULATORY_CARE_PROVIDER_SITE_OTHER): Payer: 59 | Admitting: Internal Medicine

## 2014-10-28 VITALS — BP 120/78 | HR 78 | Temp 98.3°F | Resp 16 | Ht 67.0 in | Wt 217.0 lb

## 2014-10-28 DIAGNOSIS — K279 Peptic ulcer, site unspecified, unspecified as acute or chronic, without hemorrhage or perforation: Secondary | ICD-10-CM

## 2014-10-28 DIAGNOSIS — K589 Irritable bowel syndrome without diarrhea: Secondary | ICD-10-CM

## 2014-10-28 DIAGNOSIS — R1013 Epigastric pain: Secondary | ICD-10-CM

## 2014-10-28 MED ORDER — DEXLANSOPRAZOLE 60 MG PO CPDR: 60.0000 mg | DELAYED_RELEASE_CAPSULE | Freq: Every day | ORAL | Status: DC

## 2014-10-28 NOTE — Progress Notes (Signed)
Pre visit review using our clinic review tool, if applicable. No additional management support is needed unless otherwise documented below in the visit note. 

## 2014-10-28 NOTE — Progress Notes (Signed)
Subjective:    Patient ID: Eugene Mccullough, male    DOB: March 12, 1961, 54 y.o.   MRN: [id number]  HPI Comments: He returns for a f/up on abd pain that has been present for several months, his recent CT showed fatty liver and sone benign renal cysts. Today he describes the pain as being in the epigastric region and radiating into his back. Eating food makes the pain resolve so he has gained weight. He has also had a few episodes of diarrhea - he tried Viberzi for the pain and diarrhea but it caused constipation so he stopped it. The CT scan showed that he has had his gallbladder removed so will try Viberzi at a lower dose in light on this info. He also tells me that 20 years ago when he lived in [place] he was told that he had PUD related to HBP infection which was treated, though he has had a few recurrences of stomach ulcers over the last 2 decades.  Abdominal Pain This is a recurrent problem. The current episode started more than 1 month ago. The onset quality is gradual. The problem occurs intermittently. The problem has been unchanged. The pain is located in the epigastric region. The pain is at a severity of 3/10. The pain is mild. The quality of the pain is aching. The abdominal pain radiates to the back. Associated symptoms include diarrhea. Pertinent negatives include no anorexia, arthralgias, belching, constipation, dysuria, fever, flatus, frequency, headaches, hematochezia, hematuria, melena, myalgias, nausea, vomiting or weight loss. The pain is relieved by eating. He has tried proton pump inhibitors for the symptoms. Prior diagnostic workup includes CT scan.      Review of Systems  Constitutional: Positive for unexpected weight change (wt gain). Negative for fever, chills, weight loss, diaphoresis, appetite change and fatigue.  HENT: Negative.   Eyes: Negative.   Respiratory: Negative.  Negative for cough, choking, shortness of breath and stridor.   Cardiovascular: Negative.  Negative for  chest pain, palpitations and leg swelling.  Gastrointestinal: Positive for abdominal pain and diarrhea. Negative for nausea, vomiting, constipation, blood in stool, melena, hematochezia, abdominal distention, anal bleeding, rectal pain, anorexia and flatus.  Endocrine: Negative.   Genitourinary: Negative.  Negative for dysuria, urgency, frequency, hematuria, decreased urine volume and difficulty urinating.  Musculoskeletal: Negative.  Negative for myalgias and arthralgias.  Skin: Negative.   Allergic/Immunologic: Negative.   Neurological: Negative.  Negative for headaches.  Hematological: Negative.  Negative for adenopathy. Does not bruise/bleed easily.  Psychiatric/Behavioral: Negative.        Objective:   Physical Exam  Constitutional: He is oriented to person, place, and time. He appears well-developed and well-nourished. No distress.  HENT:  Head: Normocephalic and atraumatic.  Mouth/Throat: Oropharynx is clear and moist. No oropharyngeal exudate.  Eyes: Conjunctivae are normal. Right eye exhibits no discharge. Left eye exhibits no discharge. No scleral icterus.  Neck: Normal range of motion. Neck supple. No JVD present. No tracheal deviation present. No thyromegaly present.  Cardiovascular: Normal rate, regular rhythm, normal heart sounds and intact distal pulses.  Exam reveals no gallop and no friction rub.   No murmur heard. Pulmonary/Chest: Effort normal and breath sounds normal. No stridor. No respiratory distress. He has no wheezes. He has no rales. He exhibits no tenderness.  Abdominal: Soft. Bowel sounds are normal. He exhibits no distension and no mass. There is no tenderness. There is no rebound and no guarding.  Musculoskeletal: Normal range of motion. He exhibits no edema or tenderness.  Lymphadenopathy:    He has no cervical adenopathy.  Neurological: He is oriented to person, place, and time.  Skin: Skin is warm and dry. No rash noted. He is not diaphoretic. No  erythema. No pallor.  Vitals reviewed.     Lab Results  Component Value Date   WBC 6.7 05/18/2014   HGB 13.9 05/18/2014   HCT 41.6 05/18/2014   PLT 193.0 05/18/2014   GLUCOSE 92 05/18/2014   CHOL 184 01/02/2013   TRIG 173.0* 01/02/2013   HDL 33.20* 01/02/2013   LDLCALC 116* 01/02/2013   ALT 23 05/18/2014   AST 23 05/18/2014   NA 135 05/18/2014   K 4.7 05/18/2014   CL 101 05/18/2014   CREATININE 1.0 05/18/2014   BUN 14 05/18/2014   CO2 28 05/18/2014   TSH 0.38 05/18/2014   PSA 1.25 01/02/2013   HGBA1C 5.6 04/05/2010      Assessment & Plan:

## 2014-10-28 NOTE — Patient Instructions (Signed)
Peptic Ulcer A peptic ulcer is a sore in the lining of your esophagus (esophageal ulcer), stomach (gastric ulcer), or in the first part of your small intestine (duodenal ulcer). The ulcer causes erosion into the deeper tissue. CAUSES  Normally, the lining of the stomach and the small intestine protects itself from the acid that digests food. The protective lining can be damaged by:  An infection caused by a bacterium called Helicobacter pylori (H. pylori).  Regular use of nonsteroidal anti-inflammatory drugs (NSAIDs), such as ibuprofen or aspirin.  Smoking tobacco. Other risk factors include being older than 80, drinking alcohol excessively, and having a family history of ulcer disease.  SYMPTOMS   Burning pain or gnawing in the area between the chest and the belly button.  Heartburn.  Nausea and vomiting.  Bloating. The pain can be worse on an empty stomach and at night. If the ulcer results in bleeding, it can cause:  Black, tarry stools.  Vomiting of bright red blood.  Vomiting of coffee-ground-looking materials. DIAGNOSIS  A diagnosis is usually made based upon your history and an exam. Other tests and procedures may be performed to find the cause of the ulcer. Finding a cause will help determine the best treatment. Tests and procedures may include:  Blood tests, stool tests, or breath tests to check for the bacterium H. pylori.  An upper gastrointestinal (GI) series of the esophagus, stomach, and small intestine.  An endoscopy to examine the esophagus, stomach, and small intestine.  A biopsy. TREATMENT  Treatment may include:  Eliminating the cause of the ulcer, such as smoking, NSAIDs, or alcohol.  Medicines to reduce the amount of acid in your digestive tract.  Antibiotic medicines if the ulcer is caused by the H. pylori bacterium.  An upper endoscopy to treat a bleeding ulcer.  Surgery if the bleeding is severe or if the ulcer created a hole somewhere in the  digestive system. HOME CARE INSTRUCTIONS   Avoid tobacco, alcohol, and caffeine. Smoking can increase the acid in the stomach, and continued smoking will impair the healing of ulcers.  Avoid foods and drinks that seem to cause discomfort or aggravate your ulcer.  Only take medicines as directed by your caregiver. Do not substitute over-the-counter medicines for prescription medicines without talking to your caregiver.  Keep any follow-up appointments and tests as directed. SEEK MEDICAL CARE IF:   Your do not improve within 7 days of starting treatment.  You have ongoing indigestion or heartburn. SEEK IMMEDIATE MEDICAL CARE IF:   You have sudden, sharp, or persistent abdominal pain.  You have bloody or dark black, tarry stools.  You vomit blood or vomit that looks like coffee grounds.  You become light-headed, weak, or feel faint.  You become sweaty or clammy. MAKE SURE YOU:   Understand these instructions.  Will watch your condition.  Will get help right away if you are not doing well or get worse. Document Released: 08/10/2000 Document Revised: 12/28/2013 Document Reviewed: 03/12/2012 [facility] Patient Information 2015 [place], [place]. This information is not intended to replace advice given to you by your health care provider. Make sure you discuss any questions you have with your health care provider.

## 2014-10-29 ENCOUNTER — Encounter: Payer: Self-pay | Admitting: Internal Medicine

## 2014-10-29 NOTE — Assessment & Plan Note (Signed)
Exam, labs, CT do not reveal any cause for the amount of pain that he complains of Will refer to GI to see if upper endoscopy is indicated

## 2014-10-29 NOTE — Assessment & Plan Note (Signed)
I have asked him to see GI to consider upper endoscopy, he is high risk for UGI complications in light of the info about HBP infection His recent CBC was normal so I don't think he is losing blood Will upgrade his PPI to dexilant He did not get relief with levbid

## 2014-10-29 NOTE — Assessment & Plan Note (Signed)
His pain may still be related to IBS Will try Viberzi again but at the lower dose (adjusted for cholecystectomy)

## 2014-11-01 ENCOUNTER — Other Ambulatory Visit: Payer: Self-pay | Admitting: Internal Medicine

## 2014-11-15 ENCOUNTER — Ambulatory Visit (INDEPENDENT_AMBULATORY_CARE_PROVIDER_SITE_OTHER): Payer: 59 | Admitting: Nurse Practitioner

## 2014-11-15 ENCOUNTER — Other Ambulatory Visit: Payer: Self-pay

## 2014-11-15 ENCOUNTER — Encounter: Payer: Self-pay | Admitting: Nurse Practitioner

## 2014-11-15 VITALS — BP 102/60 | HR 60 | Ht 66.0 in | Wt 209.0 lb

## 2014-11-15 DIAGNOSIS — R1013 Epigastric pain: Secondary | ICD-10-CM

## 2014-11-15 DIAGNOSIS — K589 Irritable bowel syndrome without diarrhea: Secondary | ICD-10-CM

## 2014-11-15 DIAGNOSIS — G8929 Other chronic pain: Secondary | ICD-10-CM

## 2014-11-15 MED ORDER — DEXLANSOPRAZOLE 60 MG PO CPDR: 60.0000 mg | DELAYED_RELEASE_CAPSULE | Freq: Every day | ORAL | Status: DC

## 2014-11-15 MED ORDER — OMEPRAZOLE 40 MG PO CPDR: 40.0000 mg | DELAYED_RELEASE_CAPSULE | Freq: Two times a day (BID) | ORAL | Status: DC

## 2014-11-15 NOTE — Patient Instructions (Addendum)
A new prescription for OMEPRAZOLE 40 MG TWICE DAILY to treat your stomach has been called to [facility] on [address]. Please read through the diet information and follow it as closely as you can. Follow up in 2 to 3 months with Dr Ardis Hughs or Nevin Bloodgood.

## 2014-11-15 NOTE — Progress Notes (Signed)
HPI :  Patient is a 54 year old male from [place],  referred by PCP. He is known remotely to Dr. Ardis Mccullough for history of peptic ulcer disease and chronic diarrhea/ He had an EGD and colonoscopy in 2012 with findings of h.pylori related gastritis, left-sided diverticular changes and external hemorrhoids.  Of note his ova parasite study showed an atypical parasite (Endolimax nana).  Patient was treated with Flagyl. His tTg was normal.   Patient complains of epigastric discomfort radiating through to his back, better with eating. This pain is not new, it has occurred intermittently for years. No NSAID use. No vomiting. No weight loss. Patient on chronic PPI therapy. He has tried Carafate as well as Levbid in the past without significant improvement in pain.  He had a positive H. pylori antibody in December and was treated with amoxicillin and clarithromycin.  PCP also changed omeprazole to Dexilant. Pain improved on Dexilant but he only had 14 days of  samples.   Patient also complains bowel changes with constipation and diarrhea. Sounds like his problems initially began with constipation for which she was given Linzess. This caused diarrhea. Patient was then given [place] which caused constipation. For whatever reason patient's bowels are actually normal now without any medication. He attributes normalization of bowel movements to discontinuation of carbohydrates.    Past Medical History  Diagnosis Date  . Gastric ulcer   . H. pylori infection   . GERD (gastroesophageal reflux disease)   . IBS (irritable bowel syndrome)   . Gout     Family History  Problem Relation Age of Onset  . Hyperlipidemia Father   . Cancer Neg Hx   . Early death Neg Hx   . Heart disease Neg Hx   . Hypertension Neg Hx   . Kidney disease Neg Hx   . Stroke Neg Hx   . Alcohol abuse Neg Hx   . Diabetes Neg Hx   . Crohn's disease Brother    History  Substance Use Topics  . Smoking status: Former Smoker -- 1.00  packs/day for 20 years    Types: Cigarettes    Quit date: 09/27/2012  . Smokeless tobacco: Never Used  . Alcohol Use: No   Current Outpatient Prescriptions  Medication Sig Dispense Refill  . amLODipine (NORVASC) 10 MG tablet TAKE 1 TABLET (10 MG TOTAL) BY MOUTH DAILY. 90 tablet 2  . finasteride (PROSCAR) 5 MG tablet Take 5 mg by mouth daily.    Marland Kitchen lisinopril (PRINIVIL,ZESTRIL) 20 MG tablet TAKE 1 TABLET (20 MG TOTAL) BY MOUTH 2 (TWO) TIMES DAILY. 180 tablet 3  . metoprolol (LOPRESSOR) 50 MG tablet TAKE 1 TABLET (50 MG TOTAL) BY MOUTH 2 (TWO) TIMES DAILY. 180 tablet 3  . omeprazole (PRILOSEC) 40 MG capsule Take 40 mg by mouth daily.     No current facility-administered medications for this visit.   Allergies  Allergen Reactions  . Hydrocodone-Acetaminophen Itching     Review of Systems: Positive for back pain, fatigue, hearing problems, itching and skin rash. All other systems reviewed and negative except where noted in HPI.   Physical Exam: BP 102/60 mmHg  Pulse 60  Ht 5\' 6"  (1.676 m)  Wt 209 lb (94.802 kg)  BMI 33.75 kg/m2 Constitutional: Pleasant,well-developed, male in no acute distress. HEENT: Normocephalic and atraumatic. Conjunctivae are normal. No scleral icterus. Neck supple.  Cardiovascular: Normal rate, regular rhythm.  Pulmonary/chest: Effort normal and breath sounds normal. No wheezing, rales or rhonchi. Abdominal: Soft, nondistended, nontender. Bowel sounds  active throughout. There are no masses palpable. No hepatomegaly. Extremities: no edema Lymphadenopathy: No cervical adenopathy noted. Neurological: Alert and oriented to person place and time. Skin: Skin is warm and dry. No rashes noted. Psychiatric: Normal mood and affect. Behavior is normal.   ASSESSMENT AND PLAN:  1. Pleasant 54 year old male from [place] with chronic, intermittent epigastric pain. He gives a remote history of peptic ulcer disease but last EGD in 2012 (Dr. Ardis Mccullough) revealed only H.pylori  gastritis. No NSAID use, on chronic PPI. He is post-cholecystectomy.    In December 2015 patient was treated for a positive H.pylori IgG. Patient had H.pylori in 2012, we treated him. It is likely that the recent positive test is just a reflection of previous infection      No alarm features, abdominal exam is benign. Recent abd/pelvis CT scan with contrast unremarkable. Will discontinue omeprazole in hopes of getting Dexilant. If cannot afford Dexliant then try increasing omeprazole 40 mg to twice a day before meals.  2. Bowel changes, resolved. Patient gives a 20+ year history of intermittent bowel pattern changes. He may have irritable bowel syndrome. Up to date on colon cancer screening.     CC. Eugene Calico, MD

## 2014-11-16 DIAGNOSIS — G8929 Other chronic pain: Secondary | ICD-10-CM | POA: Insufficient documentation

## 2014-11-16 DIAGNOSIS — R1013 Epigastric pain: Principal | ICD-10-CM

## 2014-11-16 NOTE — Progress Notes (Signed)
I agree with the above note, plan 

## 2014-11-25 ENCOUNTER — Other Ambulatory Visit: Payer: Self-pay | Admitting: Internal Medicine

## 2014-12-06 ENCOUNTER — Other Ambulatory Visit: Payer: Self-pay | Admitting: Internal Medicine

## 2014-12-19 ENCOUNTER — Other Ambulatory Visit: Payer: Self-pay | Admitting: Internal Medicine

## 2015-01-12 ENCOUNTER — Ambulatory Visit (INDEPENDENT_AMBULATORY_CARE_PROVIDER_SITE_OTHER): Payer: 59 | Admitting: Internal Medicine

## 2015-01-12 ENCOUNTER — Encounter: Payer: Self-pay | Admitting: Internal Medicine

## 2015-01-12 VITALS — BP 120/86 | HR 74 | Temp 98.6°F | Ht 66.0 in | Wt 197.0 lb

## 2015-01-12 DIAGNOSIS — N401 Enlarged prostate with lower urinary tract symptoms: Secondary | ICD-10-CM

## 2015-01-12 DIAGNOSIS — M1000 Idiopathic gout, unspecified site: Secondary | ICD-10-CM

## 2015-01-12 DIAGNOSIS — R21 Rash and other nonspecific skin eruption: Secondary | ICD-10-CM | POA: Diagnosis not present

## 2015-01-12 DIAGNOSIS — R338 Other retention of urine: Secondary | ICD-10-CM

## 2015-01-12 DIAGNOSIS — I10 Essential (primary) hypertension: Secondary | ICD-10-CM | POA: Diagnosis not present

## 2015-01-12 DIAGNOSIS — K279 Peptic ulcer, site unspecified, unspecified as acute or chronic, without hemorrhage or perforation: Secondary | ICD-10-CM

## 2015-01-12 MED ORDER — METOPROLOL TARTRATE 50 MG PO TABS: ORAL_TABLET | ORAL | Status: DC

## 2015-01-12 MED ORDER — AMLODIPINE BESYLATE 10 MG PO TABS: 10.0000 mg | ORAL_TABLET | Freq: Every day | ORAL | Status: DC

## 2015-01-12 MED ORDER — FINASTERIDE 5 MG PO TABS: 5.0000 mg | ORAL_TABLET | Freq: Every day | ORAL | Status: DC

## 2015-01-12 MED ORDER — LISINOPRIL 20 MG PO TABS: ORAL_TABLET | ORAL | Status: DC

## 2015-01-12 MED ORDER — OMEPRAZOLE 40 MG PO CPDR: 40.0000 mg | DELAYED_RELEASE_CAPSULE | Freq: Two times a day (BID) | ORAL | Status: DC

## 2015-01-12 NOTE — Patient Instructions (Signed)

## 2015-01-12 NOTE — Progress Notes (Signed)
Subjective:  Patient ID: Eugene Mccullough, male    DOB: May 20, 1961  Age: 54 y.o. MRN: [id number]  CC: Rash; Hypertension; and Gastrophageal Reflux   HPI Eugene Mccullough presents for follow up - today he complains that the skin on his face is dark and he wants to see a dermatologist. He tells me that the abd pain has resolved. He otherwise feels well and offers no complaints.  Outpatient Prescriptions Prior to Visit  Medication Sig Dispense Refill  . finasteride (PROSCAR) 5 MG tablet Take 5 mg by mouth daily.    Marland Kitchen lisinopril (PRINIVIL,ZESTRIL) 20 MG tablet TAKE 1 TABLET (20 MG TOTAL) BY MOUTH 2 (TWO) TIMES DAILY. 180 tablet 3  . metoprolol (LOPRESSOR) 50 MG tablet TAKE 1 TABLET (50 MG TOTAL) BY MOUTH 2 (TWO) TIMES DAILY. 180 tablet 3  . omeprazole (PRILOSEC) 40 MG capsule Take 1 capsule (40 mg total) by mouth 2 (two) times daily. 60 capsule 3  . amLODipine (NORVASC) 10 MG tablet TAKE 1 TABLET (10 MG TOTAL) BY MOUTH DAILY. 90 tablet 2  . amLODipine (NORVASC) 10 MG tablet TAKE 1 TABLET (10 MG TOTAL) BY MOUTH DAILY. 90 tablet 3  . amLODipine (NORVASC) 10 MG tablet TAKE 1 TABLET (10 MG TOTAL) BY MOUTH DAILY. 90 tablet 3  . amLODipine (NORVASC) 10 MG tablet TAKE 1 TABLET (10 MG TOTAL) BY MOUTH DAILY. 90 tablet 3   No facility-administered medications prior to visit.    ROS Review of Systems  Constitutional: Negative for fever, chills, diaphoresis, appetite change and fatigue.  HENT: Negative.   Eyes: Negative.   Respiratory: Negative.  Negative for cough, choking, chest tightness, shortness of breath and stridor.   Cardiovascular: Negative.  Negative for chest pain, palpitations and leg swelling.  Gastrointestinal: Negative.  Negative for nausea, vomiting, abdominal pain, diarrhea, constipation, abdominal distention and rectal pain.  Endocrine: Negative.   Genitourinary: Negative.   Musculoskeletal: Negative for myalgias, back pain, joint swelling, gait problem and neck pain.  Skin:  Positive for rash.  Allergic/Immunologic: Negative.   Neurological: Negative.   Hematological: Negative.  Negative for adenopathy. Does not bruise/bleed easily.  Psychiatric/Behavioral: Negative.     Objective:  BP 120/86 mmHg  Pulse 74  Temp(Src) 98.6 F (37 C) (Oral)  Ht 5\' 6"  (1.676 m)  Wt 197 lb (89.359 kg)  BMI 31.81 kg/m2  SpO2 98%  BP Readings from Last 3 Encounters:  01/12/15 120/86  11/15/14 102/60  10/28/14 120/78    Wt Readings from Last 3 Encounters:  01/12/15 197 lb (89.359 kg)  11/15/14 209 lb (94.802 kg)  10/28/14 217 lb (98.431 kg)    Physical Exam  Constitutional: He is oriented to person, place, and time. He appears well-developed and well-nourished.  Non-toxic appearance. He does not have a sickly appearance. He does not appear ill. No distress.  HENT:  Head: Normocephalic and atraumatic.  Mouth/Throat: Oropharynx is clear and moist. No oropharyngeal exudate.  Eyes: Conjunctivae are normal. Right eye exhibits no discharge. Left eye exhibits no discharge. No scleral icterus.  Neck: Normal range of motion. Neck supple. No JVD present. No tracheal deviation present. No thyromegaly present.  Cardiovascular: Normal rate, regular rhythm, normal heart sounds and intact distal pulses.  Exam reveals no gallop and no friction rub.   No murmur heard. Pulmonary/Chest: Effort normal and breath sounds normal. No stridor. No respiratory distress. He has no wheezes. He has no rales. He exhibits no tenderness.  Abdominal: Soft. Bowel sounds are normal. He exhibits no  distension and no mass. There is no tenderness. There is no rebound and no guarding.  Musculoskeletal: Normal range of motion. He exhibits no edema or tenderness.  Lymphadenopathy:    He has no cervical adenopathy.  Neurological: He is oriented to person, place, and time.  Skin: Skin is warm and dry. No rash noted. He is not diaphoretic. No erythema. No pallor.  Facial skin is diffusely hyperpigmented but  there are no epidermal lesions  Vitals reviewed.   Lab Results  Component Value Date   WBC 6.7 05/18/2014   HGB 13.9 05/18/2014   HCT 41.6 05/18/2014   PLT 193.0 05/18/2014   GLUCOSE 92 05/18/2014   CHOL 184 01/02/2013   TRIG 173.0* 01/02/2013   HDL 33.20* 01/02/2013   LDLCALC 116* 01/02/2013   ALT 23 05/18/2014   AST 23 05/18/2014   NA 135 05/18/2014   K 4.7 05/18/2014   CL 101 05/18/2014   CREATININE 1.0 05/18/2014   BUN 14 05/18/2014   CO2 28 05/18/2014   TSH 0.38 05/18/2014   PSA 1.25 01/02/2013   HGBA1C 5.6 04/05/2010    Ct Abdomen Pelvis W Contrast  10/08/2014   CLINICAL DATA:  Long-standing abdominal pain with increase in frequency, diarrhea  EXAM: CT ABDOMEN AND PELVIS WITH CONTRAST  TECHNIQUE: Multidetector CT imaging of the abdomen and pelvis was performed using the standard protocol following bolus administration of intravenous contrast.  CONTRAST:  143mL OMNIPAQUE IOHEXOL 300 MG/ML  SOLN  COMPARISON:  None.  FINDINGS: The lung bases are free of acute infiltrate or sizable effusion.  The liver is diffusely fatty infiltrated. The gallbladder has been surgically removed. The spleen, adrenal glands and pancreas are all normal in their CT appearance. The kidneys demonstrate a normal enhancement pattern with the exception of a few small cortical cysts. Delayed images demonstrate normal excretion of contrast material. A hiatal hernia is noted.  The appendix is well visualized and within normal limits. Scattered diverticular change of the sigmoid colon is seen without evidence of diverticulitis. The bladder is well distended. No pelvic mass lesion or sidewall abnormality is seen. No acute bony abnormality is seen.  IMPRESSION: Fatty liver.  Bilateral renal cysts.  Diverticulosis without diverticulitis.  No acute abnormality is seen.   Electronically Signed   By: Inez Catalina M.D.   On: 10/08/2014 08:34    Assessment & Plan:   Eugene Mccullough was seen today for rash, hypertension and  gastrophageal reflux.  Diagnoses and all orders for this visit:  Rash of face- this appears to be normal pigmentation for him, will refer to derm for further evlauation Orders: -     Ambulatory referral to Dermatology  Essential hypertension- his BP is well controlled Orders: -     amLODipine (NORVASC) 10 MG tablet; Take 1 tablet (10 mg total) by mouth daily. -     lisinopril (PRINIVIL,ZESTRIL) 20 MG tablet; TAKE 1 TABLET (20 MG TOTAL) BY MOUTH 2 (TWO) TIMES DAILY. -     metoprolol (LOPRESSOR) 50 MG tablet; TAKE 1 TABLET (50 MG TOTAL) BY MOUTH 2 (TWO) TIMES DAILY.  PUD (peptic ulcer disease) Orders: -     omeprazole (PRILOSEC) 40 MG capsule; Take 1 capsule (40 mg total) by mouth 2 (two) times daily.  Idiopathic gout, unspecified chronicity, unspecified site  BPH (benign prostatic hypertrophy) with urinary retention Orders: -     finasteride (PROSCAR) 5 MG tablet; Take 1 tablet (5 mg total) by mouth daily.   I have discontinued Mr. Patalano amLODipine,  amLODipine, amLODipine, and amLODipine. I have also changed his amLODipine and finasteride. Additionally, I am having him maintain his lisinopril, metoprolol, and omeprazole.  Meds ordered this encounter  Medications  . DISCONTD: amLODipine (NORVASC) 10 MG tablet    Sig:   . amLODipine (NORVASC) 10 MG tablet    Sig: Take 1 tablet (10 mg total) by mouth daily.    Dispense:  90 tablet    Refill:  3  . finasteride (PROSCAR) 5 MG tablet    Sig: Take 1 tablet (5 mg total) by mouth daily.    Dispense:  90 tablet    Refill:  3  . lisinopril (PRINIVIL,ZESTRIL) 20 MG tablet    Sig: TAKE 1 TABLET (20 MG TOTAL) BY MOUTH 2 (TWO) TIMES DAILY.    Dispense:  180 tablet    Refill:  3  . metoprolol (LOPRESSOR) 50 MG tablet    Sig: TAKE 1 TABLET (50 MG TOTAL) BY MOUTH 2 (TWO) TIMES DAILY.    Dispense:  180 tablet    Refill:  3  . omeprazole (PRILOSEC) 40 MG capsule    Sig: Take 1 capsule (40 mg total) by mouth 2 (two) times daily.     Dispense:  180 capsule    Refill:  3     Follow-up: No Follow-up on file.  Scarlette Calico, MD

## 2015-02-02 ENCOUNTER — Other Ambulatory Visit: Payer: Self-pay

## 2015-02-02 DIAGNOSIS — I10 Essential (primary) hypertension: Secondary | ICD-10-CM

## 2015-02-02 MED ORDER — LISINOPRIL 20 MG PO TABS
20.0000 mg | ORAL_TABLET | Freq: Two times a day (BID) | ORAL | Status: DC
Start: 2015-02-02 — End: 2015-02-10

## 2015-02-09 ENCOUNTER — Encounter: Payer: Self-pay | Admitting: Internal Medicine

## 2015-02-09 DIAGNOSIS — N401 Enlarged prostate with lower urinary tract symptoms: Secondary | ICD-10-CM

## 2015-02-09 DIAGNOSIS — I10 Essential (primary) hypertension: Secondary | ICD-10-CM

## 2015-02-09 DIAGNOSIS — K279 Peptic ulcer, site unspecified, unspecified as acute or chronic, without hemorrhage or perforation: Secondary | ICD-10-CM

## 2015-02-09 DIAGNOSIS — R338 Other retention of urine: Secondary | ICD-10-CM

## 2015-02-10 MED ORDER — METOPROLOL TARTRATE 50 MG PO TABS: ORAL_TABLET | ORAL | Status: DC

## 2015-02-10 MED ORDER — LISINOPRIL 20 MG PO TABS: 20.0000 mg | ORAL_TABLET | Freq: Two times a day (BID) | ORAL | Status: DC

## 2015-02-10 MED ORDER — AMLODIPINE BESYLATE 10 MG PO TABS: 10.0000 mg | ORAL_TABLET | Freq: Every day | ORAL | Status: DC

## 2015-02-10 MED ORDER — OMEPRAZOLE 40 MG PO CPDR: 40.0000 mg | DELAYED_RELEASE_CAPSULE | Freq: Two times a day (BID) | ORAL | Status: DC

## 2015-02-10 MED ORDER — FINASTERIDE 5 MG PO TABS: 5.0000 mg | ORAL_TABLET | Freq: Every day | ORAL | Status: DC

## 2015-10-07 ENCOUNTER — Ambulatory Visit (INDEPENDENT_AMBULATORY_CARE_PROVIDER_SITE_OTHER): Payer: BLUE CROSS/BLUE SHIELD | Admitting: Nurse Practitioner

## 2015-10-07 ENCOUNTER — Encounter: Payer: Self-pay | Admitting: Nurse Practitioner

## 2015-10-07 VITALS — BP 112/70 | HR 67 | Temp 97.7°F | Resp 16 | Wt 208.0 lb

## 2015-10-07 DIAGNOSIS — R21 Rash and other nonspecific skin eruption: Secondary | ICD-10-CM | POA: Diagnosis not present

## 2015-10-07 MED ORDER — HYDROCORTISONE 2.5 % EX CREA: TOPICAL_CREAM | Freq: Two times a day (BID) | CUTANEOUS | Status: DC

## 2015-10-07 NOTE — Assessment & Plan Note (Signed)
New onset Probable atopic dermatitis  Asked pt to use eucerin OTC all over to moisturize Benadryl OTC for pruritis  Hydrocortisone 2.5% called into pharmacy to use on patches only twice daily x 14 days max FU prn worsening/failure to improve.

## 2015-10-07 NOTE — Progress Notes (Signed)
Patient ID: Eugene Mccullough, male    DOB: 03/14/1961  Age: 55 y.o. MRN: [id number]  CC: Rash   HPI Eugene Mccullough presents for CC of rash x 2 weeks.   1) Pain from pruritis of legs and arms  Back and stomach also has places Last week was worse Treatment to date:  None Denies new pets, foods, lotions, detergents Denies wounds or discharge/oozing  Very dry skin  History Eugene Mccullough has a past medical history of Gastric ulcer; H. pylori infection; GERD (gastroesophageal reflux disease); IBS (irritable bowel syndrome); and Gout.   Eugene Mccullough has past surgical history that includes Cholecystectomy (2005).   His family history includes Crohn's disease in his brother; Hyperlipidemia in his father. There is no history of Cancer, Early death, Heart disease, Hypertension, Kidney disease, Stroke, Alcohol abuse, or Diabetes.Eugene Mccullough reports that Eugene Mccullough quit smoking about 3 years ago. His smoking use included Cigarettes. Eugene Mccullough has a 20 pack-year smoking history. Eugene Mccullough has never used smokeless tobacco. Eugene Mccullough reports that Eugene Mccullough does not drink alcohol or use illicit drugs.  Outpatient Prescriptions Prior to Visit  Medication Sig Dispense Refill  . amLODipine (NORVASC) 10 MG tablet Take 1 tablet (10 mg total) by mouth daily. 90 tablet 3  . finasteride (PROSCAR) 5 MG tablet Take 1 tablet (5 mg total) by mouth daily. 90 tablet 3  . lisinopril (PRINIVIL,ZESTRIL) 20 MG tablet Take 1 tablet (20 mg total) by mouth 2 (two) times daily. 180 tablet 3  . metoprolol (LOPRESSOR) 50 MG tablet TAKE 1 TABLET (50 MG TOTAL) BY MOUTH 2 (TWO) TIMES DAILY. 180 tablet 3  . omeprazole (PRILOSEC) 40 MG capsule Take 1 capsule (40 mg total) by mouth 2 (two) times daily. 180 capsule 3   No facility-administered medications prior to visit.    ROS Review of Systems  Constitutional: Negative for fever, chills, diaphoresis and fatigue.  HENT: Negative for sore throat, tinnitus, trouble swallowing and voice change.   Respiratory: Negative for chest tightness  and shortness of breath.   Skin: Positive for rash.    Objective:  BP 112/70 mmHg  Pulse 67  Temp(Src) 97.7 F (36.5 C) (Oral)  Resp 16  Wt 208 lb (94.348 kg)  SpO2 99%  Physical Exam  Constitutional: Eugene Mccullough is oriented to person, place, and time. Eugene Mccullough appears well-developed and well-nourished. No distress.  HENT:  Head: Normocephalic and atraumatic.  Right Ear: External ear normal.  Left Ear: External ear normal.  Cardiovascular: Normal rate, regular rhythm and normal heart sounds.  Exam reveals no gallop and no friction rub.   No murmur heard. Pulmonary/Chest: Effort normal and breath sounds normal. No respiratory distress. Eugene Mccullough has no wheezes. Eugene Mccullough has no rales. Eugene Mccullough exhibits no tenderness.  Neurological: Eugene Mccullough is alert and oriented to person, place, and time.  Skin: Skin is warm and dry. Rash noted. Eugene Mccullough is not diaphoretic.     Dry skin  Patches on legs of dry skin shown by red marks on picture, slightly indurated No sign of excoriation, infection, no hyper/hypopigmentation  Psychiatric: Eugene Mccullough has a normal mood and affect. His behavior is normal. Judgment and thought content normal.   Assessment & Plan:   Eugene Mccullough was seen today for rash.  Diagnoses and all orders for this visit:  Rash and nonspecific skin eruption  Other orders -     hydrocortisone 2.5 % cream; Apply topically 2 (two) times daily. Use for up to 14 days  I am having Mr. Hasler start on hydrocortisone. I am also having him maintain  his amLODipine, finasteride, lisinopril, metoprolol, and omeprazole.  Meds ordered this encounter  Medications  . hydrocortisone 2.5 % cream    Sig: Apply topically 2 (two) times daily. Use for up to 14 days    Dispense:  30 g    Refill:  1    Whichever size is larger    Order Specific Question:  Supervising Provider    Answer:  Crecencio Mc [[id number]]     Follow-up: Return if symptoms worsen or fail to improve.

## 2015-10-07 NOTE — Progress Notes (Signed)
Pre visit review using our clinic review tool, if applicable. No additional management support is needed unless otherwise documented below in the visit note. 

## 2015-10-07 NOTE — Patient Instructions (Addendum)
Over the counter- benadryl tablets at night- follow box instructions  Eucerin cream- over the counter - use ALL over   Hydrocortisone on patches for 1-2 weeks, twice daily.

## 2015-10-09 ENCOUNTER — Encounter: Payer: Self-pay | Admitting: Nurse Practitioner

## 2015-10-09 ENCOUNTER — Encounter: Payer: Self-pay | Admitting: Internal Medicine

## 2015-10-10 MED ORDER — HYDROCORTISONE 2.5 % EX CREA: TOPICAL_CREAM | Freq: Two times a day (BID) | CUTANEOUS | Status: DC

## 2015-10-11 ENCOUNTER — Telehealth: Payer: Self-pay | Admitting: Internal Medicine

## 2015-10-11 NOTE — Telephone Encounter (Signed)
Patient states hydrocortisone cream was sent to incorrect pharmacy.  [facility] to [facility] on [address].

## 2015-10-12 MED ORDER — HYDROCORTISONE 2.5 % EX CREA: TOPICAL_CREAM | Freq: Two times a day (BID) | CUTANEOUS | Status: DC

## 2015-10-12 NOTE — Telephone Encounter (Signed)
Pt called and has been trying to get his hydrocortisone cream sent to [facility] [address] For several days now. Can you please send this in this morning.

## 2015-10-12 NOTE — Telephone Encounter (Signed)
Done

## 2016-01-20 ENCOUNTER — Ambulatory Visit (INDEPENDENT_AMBULATORY_CARE_PROVIDER_SITE_OTHER): Payer: BLUE CROSS/BLUE SHIELD | Admitting: Family

## 2016-01-20 ENCOUNTER — Encounter: Payer: Self-pay | Admitting: Family

## 2016-01-20 VITALS — BP 110/80 | HR 82 | Temp 98.1°F | Resp 16 | Ht 66.0 in | Wt 209.8 lb

## 2016-01-20 DIAGNOSIS — S161XXA Strain of muscle, fascia and tendon at neck level, initial encounter: Secondary | ICD-10-CM | POA: Diagnosis not present

## 2016-01-20 MED ORDER — NAPROXEN-ESOMEPRAZOLE 500-20 MG PO TBEC: 1.0000 | DELAYED_RELEASE_TABLET | Freq: Two times a day (BID) | ORAL | Status: DC | PRN

## 2016-01-20 MED ORDER — TIZANIDINE HCL 4 MG PO TABS: 4.0000 mg | ORAL_TABLET | Freq: Four times a day (QID) | ORAL | Status: DC | PRN

## 2016-01-20 MED ORDER — DICLOFENAC SODIUM 2 % TD SOLN: 1.0000 "application " | Freq: Two times a day (BID) | TRANSDERMAL | Status: DC | PRN

## 2016-01-20 NOTE — Patient Instructions (Signed)
Thank you for choosing [facility].  Summary/Instructions:  Ice/heat 2-3 times per day and as needed.  Exercises and stretches a couple of times per day.  Pennsaid - 1/2 pack to the affected area 2 x per day  Vimovo - 1 tablet twice daily for 4 days and then as needed.  Your prescription(s) have been submitted to your pharmacy or been printed and provided for you. Please take as directed and contact our office if you believe you are having problem(s) with the medication(s) or have any questions.  If your symptoms worsen or fail to improve, please contact our office for further instruction, or in case of emergency go directly to the emergency room at the closest medical facility.    Cervical Strain and Sprain With Rehab Cervical strain and sprain are injuries that commonly occur with "whiplash" injuries. Whiplash occurs when the neck is forcefully whipped backward or forward, such as during a motor vehicle accident or during contact sports. The muscles, ligaments, tendons, discs, and nerves of the neck are susceptible to injury when this occurs. RISK FACTORS Risk of having a whiplash injury increases if:  Osteoarthritis of the spine.  Situations that make head or neck accidents or trauma more likely.  High-risk sports (football, rugby, wrestling, hockey, auto racing, gymnastics, diving, contact karate, or boxing).  Poor strength and flexibility of the neck.  Previous neck injury.  Poor tackling technique.  Improperly fitted or padded equipment. SYMPTOMS   Pain or stiffness in the front or back of neck or both.  Symptoms may present immediately or up to 24 hours after injury.  Dizziness, headache, nausea, and vomiting.  Muscle spasm with soreness and stiffness in the neck.  Tenderness and swelling at the injury site. PREVENTION  Learn and use proper technique (avoid tackling with the head, spearing, and head-butting; use proper falling techniques to avoid landing  on the head).  Warm up and stretch properly before activity.  Maintain physical fitness:  Strength, flexibility, and endurance.  Cardiovascular fitness.  Wear properly fitted and padded protective equipment, such as padded soft collars, for participation in contact sports. PROGNOSIS  Recovery from cervical strain and sprain injuries is dependent on the extent of the injury. These injuries are usually curable in 1 week to 3 months with appropriate treatment.  RELATED COMPLICATIONS   Temporary numbness and weakness may occur if the nerve roots are damaged, and this may persist until the nerve has completely healed.  Chronic pain due to frequent recurrence of symptoms.  Prolonged healing, especially if activity is resumed too soon (before complete recovery). TREATMENT  Treatment initially involves the use of ice and medication to help reduce pain and inflammation. It is also important to perform strengthening and stretching exercises and modify activities that worsen symptoms so the injury does not get worse. These exercises may be performed at home or with a therapist. For patients who experience severe symptoms, a soft, padded collar may be recommended to be worn around the neck.  Improving your posture may help reduce symptoms. Posture improvement includes pulling your chin and abdomen in while sitting or standing. If you are sitting, sit in a firm chair with your buttocks against the back of the chair. While sleeping, try replacing your pillow with a small towel rolled to 2 inches in diameter, or use a cervical pillow or soft cervical collar. Poor sleeping positions delay healing.  For patients with nerve root damage, which causes numbness or weakness, the use of a cervical traction  apparatus may be recommended. Surgery is rarely necessary for these injuries. However, cervical strain and sprains that are present at birth (congenital) may require surgery. MEDICATION   If pain medication is  necessary, nonsteroidal anti-inflammatory medications, such as aspirin and ibuprofen, or other minor pain relievers, such as acetaminophen, are often recommended.  Do not take pain medication for 7 days before surgery.  Prescription pain relievers may be given if deemed necessary by your caregiver. Use only as directed and only as much as you need. HEAT AND COLD:   Cold treatment (icing) relieves pain and reduces inflammation. Cold treatment should be applied for 10 to 15 minutes every 2 to 3 hours for inflammation and pain and immediately after any activity that aggravates your symptoms. Use ice packs or an ice massage.  Heat treatment may be used prior to performing the stretching and strengthening activities prescribed by your caregiver, physical therapist, or athletic trainer. Use a heat pack or a warm soak. SEEK MEDICAL CARE IF:   Symptoms get worse or do not improve in 2 weeks despite treatment.  New, unexplained symptoms develop (drugs used in treatment may produce side effects). EXERCISES RANGE OF MOTION (ROM) AND STRETCHING EXERCISES - Cervical Strain and Sprain These exercises may help you when beginning to rehabilitate your injury. In order to successfully resolve your symptoms, you must improve your posture. These exercises are designed to help reduce the forward-head and rounded-shoulder posture which contributes to this condition. Your symptoms may resolve with or without further involvement from your physician, physical therapist or athletic trainer. While completing these exercises, remember:   Restoring tissue flexibility helps normal motion to return to the joints. This allows healthier, less painful movement and activity.  An effective stretch should be held for at least 20 seconds, although you may need to begin with shorter hold times for comfort.  A stretch should never be painful. You should only feel a gentle lengthening or release in the stretched tissue. STRETCH-  Axial Extensors  Lie on your back on the floor. You may bend your knees for comfort. Place a rolled-up hand towel or dish towel, about 2 inches in diameter, under the part of your head that makes contact with the floor.  Gently tuck your chin, as if trying to make a "double chin," until you feel a gentle stretch at the base of your head.  Hold __________ seconds. Repeat __________ times. Complete this exercise __________ times per day.  STRETCH - Axial Extension   Stand or sit on a firm surface. Assume a good posture: chest up, shoulders drawn back, abdominal muscles slightly tense, knees unlocked (if standing) and feet hip width apart.  Slowly retract your chin so your head slides back and your chin slightly lowers. Continue to look straight ahead.  You should feel a gentle stretch in the back of your head. Be certain not to feel an aggressive stretch since this can cause headaches later.  Hold for __________ seconds. Repeat __________ times. Complete this exercise __________ times per day. STRETCH - Cervical Side Bend   Stand or sit on a firm surface. Assume a good posture: chest up, shoulders drawn back, abdominal muscles slightly tense, knees unlocked (if standing) and feet hip width apart.  Without letting your nose or shoulders move, slowly tip your right / left ear to your shoulder until your feel a gentle stretch in the muscles on the opposite side of your neck.  Hold __________ seconds. Repeat __________ times. Complete this exercise __________  times per day. STRETCH - Cervical Rotators   Stand or sit on a firm surface. Assume a good posture: chest up, shoulders drawn back, abdominal muscles slightly tense, knees unlocked (if standing) and feet hip width apart.  Keeping your eyes level with the ground, slowly turn your head until you feel a gentle stretch along the back and opposite side of your neck.  Hold __________ seconds. Repeat __________ times. Complete this exercise  __________ times per day. RANGE OF MOTION - Neck Circles   Stand or sit on a firm surface. Assume a good posture: chest up, shoulders drawn back, abdominal muscles slightly tense, knees unlocked (if standing) and feet hip width apart.  Gently roll your head down and around from the back of one shoulder to the back of the other. The motion should never be forced or painful.  Repeat the motion 10-20 times, or until you feel the neck muscles relax and loosen. Repeat __________ times. Complete the exercise __________ times per day. STRENGTHENING EXERCISES - Cervical Strain and Sprain These exercises may help you when beginning to rehabilitate your injury. They may resolve your symptoms with or without further involvement from your physician, physical therapist, or athletic trainer. While completing these exercises, remember:   Muscles can gain both the endurance and the strength needed for everyday activities through controlled exercises.  Complete these exercises as instructed by your physician, physical therapist, or athletic trainer. Progress the resistance and repetitions only as guided.  You may experience muscle soreness or fatigue, but the pain or discomfort you are trying to eliminate should never worsen during these exercises. If this pain does worsen, stop and make certain you are following the directions exactly. If the pain is still present after adjustments, discontinue the exercise until you can discuss the trouble with your clinician. STRENGTH - Cervical Flexors, Isometric  Face a wall, standing about 6 inches away. Place a small pillow, a ball about 6-8 inches in diameter, or a folded towel between your forehead and the wall.  Slightly tuck your chin and gently push your forehead into the soft object. Push only with mild to moderate intensity, building up tension gradually. Keep your jaw and forehead relaxed.  Hold 10 to 20 seconds. Keep your breathing relaxed.  Release the  tension slowly. Relax your neck muscles completely before you start the next repetition. Repeat __________ times. Complete this exercise __________ times per day. STRENGTH- Cervical Lateral Flexors, Isometric   Stand about 6 inches away from a wall. Place a small pillow, a ball about 6-8 inches in diameter, or a folded towel between the side of your head and the wall.  Slightly tuck your chin and gently tilt your head into the soft object. Push only with mild to moderate intensity, building up tension gradually. Keep your jaw and forehead relaxed.  Hold 10 to 20 seconds. Keep your breathing relaxed.  Release the tension slowly. Relax your neck muscles completely before you start the next repetition. Repeat __________ times. Complete this exercise __________ times per day. STRENGTH - Cervical Extensors, Isometric   Stand about 6 inches away from a wall. Place a small pillow, a ball about 6-8 inches in diameter, or a folded towel between the back of your head and the wall.  Slightly tuck your chin and gently tilt your head back into the soft object. Push only with mild to moderate intensity, building up tension gradually. Keep your jaw and forehead relaxed.  Hold 10 to 20 seconds. Keep your breathing  relaxed.  Release the tension slowly. Relax your neck muscles completely before you start the next repetition. Repeat __________ times. Complete this exercise __________ times per day. POSTURE AND BODY MECHANICS CONSIDERATIONS - Cervical Strain and Sprain Keeping correct posture when sitting, standing or completing your activities will reduce the stress put on different body tissues, allowing injured tissues a chance to heal and limiting painful experiences. The following are general guidelines for improved posture. Your physician or physical therapist will provide you with any instructions specific to your needs. While reading these guidelines, remember:  The exercises prescribed by your provider  will help you have the flexibility and strength to maintain correct postures.  The correct posture provides the optimal environment for your joints to work. All of your joints have less wear and tear when properly supported by a spine with good posture. This means you will experience a healthier, less painful body.  Correct posture must be practiced with all of your activities, especially prolonged sitting and standing. Correct posture is as important when doing repetitive low-stress activities (typing) as it is when doing a single heavy-load activity (lifting). PROLONGED STANDING WHILE SLIGHTLY LEANING FORWARD When completing a task that requires you to lean forward while standing in one place for a long time, place either foot up on a stationary 2- to 4-inch high object to help maintain the best posture. When both feet are on the ground, the low back tends to lose its slight inward curve. If this curve flattens (or becomes too large), then the back and your other joints will experience too much stress, fatigue more quickly, and can cause pain.  RESTING POSITIONS Consider which positions are most painful for you when choosing a resting position. If you have pain with flexion-based activities (sitting, bending, stooping, squatting), choose a position that allows you to rest in a less flexed posture. You would want to avoid curling into a fetal position on your side. If your pain worsens with extension-based activities (prolonged standing, working overhead), avoid resting in an extended position such as sleeping on your stomach. Most people will find more comfort when they rest with their spine in a more neutral position, neither too rounded nor too arched. Lying on a non-sagging bed on your side with a pillow between your knees, or on your back with a pillow under your knees will often provide some relief. Keep in mind, being in any one position for a prolonged period of time, no matter how correct your  posture, can still lead to stiffness. WALKING Walk with an upright posture. Your ears, shoulders, and hips should all line up. OFFICE WORK When working at a desk, create an environment that supports good, upright posture. Without extra support, muscles fatigue and lead to excessive strain on joints and other tissues. CHAIR:  A chair should be able to slide under your desk when your back makes contact with the back of the chair. This allows you to work closely.  The chair's height should allow your eyes to be level with the upper part of your monitor and your hands to be slightly lower than your elbows.  Body position:  Your feet should make contact with the floor. If this is not possible, use a foot rest.  Keep your ears over your shoulders. This will reduce stress on your neck and low back.   This information is not intended to replace advice given to you by your health care provider. Make sure you discuss any questions you have  with your health care provider.   Document Released: 08/13/2005 Document Revised: 09/03/2014 Document Reviewed: 11/25/2008 Elsevier Interactive Patient Education [employer].

## 2016-01-20 NOTE — Progress Notes (Signed)
Pre visit review using our clinic review tool, if applicable. No additional management support is needed unless otherwise documented below in the visit note. 

## 2016-01-20 NOTE — Progress Notes (Signed)
Subjective:    Patient ID: Eugene Mccullough, male    DOB: [birth year], 55 y.o.   MRN: [id number]  Chief Complaint  Patient presents with  . Neck Pain    states he is having neck and back pain, x2 weeks does not recall doing anything strenuous to cause it    HPI:  Eugene Mccullough is a 55 y.o. male who  has a past medical history of Gastric ulcer; H. pylori infection; GERD (gastroesophageal reflux disease); IBS (irritable bowel syndrome); and Gout. and presents today For an acute office visit.  This is a new problem. Associated symptom of pain located in his neck and back between his his shoulder blades has been going on for approximately 2 weeks. Denies any trauma or strenuous activity that may have initiated. Modifying factors include Aleve which has helped. No previous history of neck injury. Denies numbness/tingling in his bilateral upper extremities. No headaches.   Allergies  Allergen Reactions  . Hydrocodone-Acetaminophen Itching     Current Outpatient Prescriptions on File Prior to Visit  Medication Sig Dispense Refill  . amLODipine (NORVASC) 10 MG tablet Take 1 tablet (10 mg total) by mouth daily. 90 tablet 3  . finasteride (PROSCAR) 5 MG tablet Take 1 tablet (5 mg total) by mouth daily. 90 tablet 3  . hydrocortisone 2.5 % cream Apply topically 2 (two) times daily. Use for up to 14 days 30 g 1  . lisinopril (PRINIVIL,ZESTRIL) 20 MG tablet Take 1 tablet (20 mg total) by mouth 2 (two) times daily. 180 tablet 3  . metoprolol (LOPRESSOR) 50 MG tablet TAKE 1 TABLET (50 MG TOTAL) BY MOUTH 2 (TWO) TIMES DAILY. 180 tablet 3  . omeprazole (PRILOSEC) 40 MG capsule Take 1 capsule (40 mg total) by mouth 2 (two) times daily. 180 capsule 3   No current facility-administered medications on file prior to visit.    Past Medical History  Diagnosis Date  . Gastric ulcer   . H. pylori infection   . GERD (gastroesophageal reflux disease)   . IBS (irritable bowel syndrome)   . Gout      Review of Systems  Constitutional: Negative for fever and chills.  Musculoskeletal: Positive for neck pain and neck stiffness.  Neurological: Negative for weakness, numbness and headaches.      Objective:    BP 110/80 mmHg  Pulse 82  Temp(Src) 98.1 F (36.7 C) (Oral)  Resp 16  Ht 5\' 6"  (1.676 m)  Wt 209 lb 12.8 oz (95.165 kg)  BMI 33.88 kg/m2  SpO2 98% Nursing note and vital signs reviewed.  Physical Exam  Constitutional: He is oriented to person, place, and time. He appears well-developed and well-nourished. No distress.  Neck:  No obvious deformity, discoloration, or edema noted. Tenderness elicited over paraspinal musculature of the lower cervical and upper thoracic spine. Exacerbated with lateral bending, flexion, and rotation. These motions are also restricted secondary to tightness. Distal pulses and sensation are intact and appropriate.  Cardiovascular: Normal rate, regular rhythm, normal heart sounds and intact distal pulses.   Pulmonary/Chest: Effort normal and breath sounds normal.  Neurological: He is alert and oriented to person, place, and time.  Skin: Skin is warm and dry.  Psychiatric: He has a normal mood and affect. His behavior is normal. Judgment and thought content normal.       Assessment & Plan:   Problem List Items Addressed This Visit      Musculoskeletal and Integument   Neck muscle strain - Primary  Symptoms and exam consistent cervical muscle strain and muscle spasm. Start Pennsaid and Vimovo. Treat conservatively with ice/heat and home exercise therapy. Discussed risks, side effects, and proper medication usage. Follow-up if symptoms worsen or do not improve for possible imaging.      Relevant Medications   Diclofenac Sodium (PENNSAID) 2 % SOLN   Naproxen-Esomeprazole 500-20 MG TBEC   tiZANidine (ZANAFLEX) 4 MG tablet       I am having Mr. Slocumb start on Diclofenac Sodium, Naproxen-Esomeprazole, and tiZANidine. I am also having  him maintain his amLODipine, finasteride, lisinopril, metoprolol, omeprazole, and hydrocortisone.   Meds ordered this encounter  Medications  . Diclofenac Sodium (PENNSAID) 2 % SOLN    Sig: Place 1 application onto the skin 2 (two) times daily as needed.    Dispense:  112 g    Refill:  1    Order Specific Question:  Supervising Provider    Answer:  Pricilla Holm A [id number]  . Naproxen-Esomeprazole 500-20 MG TBEC    Sig: Take 1 tablet by mouth 2 (two) times daily as needed.    Dispense:  60 tablet    Refill:  0    Order Specific Question:  Supervising Provider    Answer:  Pricilla Holm A [id number]  . tiZANidine (ZANAFLEX) 4 MG tablet    Sig: Take 1 tablet (4 mg total) by mouth every 6 (six) hours as needed for muscle spasms.    Dispense:  30 tablet    Refill:  0    Order Specific Question:  Supervising Provider    Answer:  Pricilla Holm A [id number]     Follow-up: Return in about 3 weeks (around 02/10/2016), or if symptoms worsen or fail to improve.  Mauricio Po, FNP

## 2016-01-20 NOTE — Assessment & Plan Note (Signed)
Symptoms and exam consistent cervical muscle strain and muscle spasm. Start Pennsaid and Vimovo. Treat conservatively with ice/heat and home exercise therapy. Discussed risks, side effects, and proper medication usage. Follow-up if symptoms worsen or do not improve for possible imaging.

## 2016-02-06 ENCOUNTER — Other Ambulatory Visit: Payer: Self-pay | Admitting: Internal Medicine

## 2016-03-03 ENCOUNTER — Other Ambulatory Visit: Payer: Self-pay | Admitting: Internal Medicine

## 2016-04-17 ENCOUNTER — Telehealth: Payer: Self-pay | Admitting: Internal Medicine

## 2016-04-17 ENCOUNTER — Encounter: Payer: BLUE CROSS/BLUE SHIELD | Admitting: Internal Medicine

## 2016-04-17 NOTE — Telephone Encounter (Signed)
Patient no showed 8/22 for CPE. Please advise.

## 2016-05-09 ENCOUNTER — Encounter: Payer: Self-pay | Admitting: Internal Medicine

## 2016-05-09 ENCOUNTER — Other Ambulatory Visit (INDEPENDENT_AMBULATORY_CARE_PROVIDER_SITE_OTHER): Payer: BLUE CROSS/BLUE SHIELD

## 2016-05-09 ENCOUNTER — Ambulatory Visit (INDEPENDENT_AMBULATORY_CARE_PROVIDER_SITE_OTHER): Payer: BLUE CROSS/BLUE SHIELD | Admitting: Internal Medicine

## 2016-05-09 VITALS — BP 116/82 | HR 64 | Temp 98.1°F | Resp 16 | Ht 67.0 in | Wt 214.1 lb

## 2016-05-09 DIAGNOSIS — R7989 Other specified abnormal findings of blood chemistry: Secondary | ICD-10-CM

## 2016-05-09 DIAGNOSIS — Z0000 Encounter for general adult medical examination without abnormal findings: Secondary | ICD-10-CM

## 2016-05-09 DIAGNOSIS — M19041 Primary osteoarthritis, right hand: Secondary | ICD-10-CM

## 2016-05-09 DIAGNOSIS — M19049 Primary osteoarthritis, unspecified hand: Secondary | ICD-10-CM | POA: Insufficient documentation

## 2016-05-09 DIAGNOSIS — M1000 Idiopathic gout, unspecified site: Secondary | ICD-10-CM | POA: Diagnosis not present

## 2016-05-09 DIAGNOSIS — L209 Atopic dermatitis, unspecified: Secondary | ICD-10-CM | POA: Diagnosis not present

## 2016-05-09 DIAGNOSIS — M19031 Primary osteoarthritis, right wrist: Secondary | ICD-10-CM

## 2016-05-09 DIAGNOSIS — I10 Essential (primary) hypertension: Secondary | ICD-10-CM | POA: Diagnosis not present

## 2016-05-09 LAB — COMPREHENSIVE METABOLIC PANEL
ALBUMIN: 4.1 g/dL (ref 3.5–5.2)
ALT: 29 U/L (ref 0–53)
AST: 22 U/L (ref 0–37)
Alkaline Phosphatase: 78 U/L (ref 39–117)
BUN: 18 mg/dL (ref 6–23)
CALCIUM: 9.4 mg/dL (ref 8.4–10.5)
CHLORIDE: 102 meq/L (ref 96–112)
CO2: 29 meq/L (ref 19–32)
Creatinine, Ser: 1.46 mg/dL (ref 0.40–1.50)
GFR: 53.16 mL/min — ABNORMAL LOW (ref >60.00)
Glucose, Bld: 109 mg/dL — ABNORMAL HIGH (ref 70–99)
POTASSIUM: 4.8 meq/L (ref 3.5–5.1)
SODIUM: 136 meq/L (ref 135–145)
Total Bilirubin: 0.4 mg/dL (ref 0.2–1.2)
Total Protein: 7.8 g/dL (ref 6.0–8.3)

## 2016-05-09 LAB — CBC WITH DIFFERENTIAL/PLATELET
BASOS PCT: 0.3 % (ref 0.0–3.0)
Basophils Absolute: 0 10*3/uL (ref 0.0–0.1)
EOS PCT: 3.4 % (ref 0.0–5.0)
Eosinophils Absolute: 0.2 10*3/uL (ref 0.0–0.7)
HEMATOCRIT: 40.8 % (ref 39.0–52.0)
HEMOGLOBIN: 13.9 g/dL (ref 13.0–17.0)
LYMPHS PCT: 30 % (ref 12.0–46.0)
Lymphs Abs: 2.1 10*3/uL (ref 0.7–4.0)
MCHC: 34.2 g/dL (ref 30.0–36.0)
MCV: 80 fl (ref 78.0–100.0)
Monocytes Absolute: 0.7 10*3/uL (ref 0.1–1.0)
Monocytes Relative: 10.3 % (ref 3.0–12.0)
Neutro Abs: 3.8 10*3/uL (ref 1.4–7.7)
Neutrophils Relative %: 56 % (ref 43.0–77.0)
Platelets: 180 10*3/uL (ref 150.0–400.0)
RBC: 5.1 Mil/uL (ref 4.22–5.81)
RDW: 15 % (ref 11.5–15.5)
WBC: 6.9 10*3/uL (ref 4.0–10.5)

## 2016-05-09 LAB — URIC ACID: Uric Acid, Serum: 7.8 mg/dL (ref 4.0–7.8)

## 2016-05-09 LAB — TSH: TSH: 0.41 u[IU]/mL (ref 0.35–4.50)

## 2016-05-09 LAB — LIPID PANEL
CHOL/HDL RATIO: 6
Cholesterol: 199 mg/dL (ref 0–200)
HDL: 34.5 mg/dL — AB (ref >39.00)
NonHDL: 164.24
Triglycerides: 275 mg/dL — ABNORMAL HIGH (ref 0.0–149.0)
VLDL: 55 mg/dL — AB (ref 0.0–40.0)

## 2016-05-09 LAB — LDL CHOLESTEROL, DIRECT: Direct LDL: 136 mg/dL

## 2016-05-09 LAB — PSA: PSA: 0.48 ng/mL (ref 0.10–4.00)

## 2016-05-09 MED ORDER — CRISABOROLE 2 % EX OINT: 1.0000 "application " | TOPICAL_OINTMENT | Freq: Two times a day (BID) | CUTANEOUS | 11 refills | Status: DC

## 2016-05-09 NOTE — Patient Instructions (Signed)

## 2016-05-09 NOTE — Progress Notes (Signed)
Subjective:  Patient ID: Eugene Mccullough, male    DOB: [birth year]  Age: 55 y.o. MRN: [id number]  CC: Annual Exam; Rash; Hypertension; and Osteoarthritis   HPI Eugene Mccullough presents for a CPX.  He complains of a chronic, itchy rash on his face, torso, abdomen, and extremities. He has been seeing a dermatologist and tells me that he has tried a topical steroid as well as an oral antihistamine. He tells me this has not helped very much.  He tells me his blood pressure has been well-controlled, he is worried about his kidney function so wants to stop taking the ACE inhibitor.  He complains of pain in his right hand and the base of his thumb. He has had surgery there before and is concerned he might need to have another surgery.  Outpatient Medications Prior to Visit  Medication Sig Dispense Refill  . amLODipine (NORVASC) 10 MG tablet Take 1 tablet (10 mg total) by mouth daily. 90 tablet 3  . finasteride (PROSCAR) 5 MG tablet Take 1 tablet (5 mg total) by mouth daily. 90 tablet 3  . metoprolol (LOPRESSOR) 50 MG tablet TAKE 1 TABLET (50 MG TOTAL) BY MOUTH 2 (TWO) TIMES DAILY. 180 tablet 3  . omeprazole (PRILOSEC) 40 MG capsule Take 1 capsule (40 mg total) by mouth 2 (two) times daily. Overdue for yearly physical w/labs must see md for refills 60 capsule 0  . hydrocortisone 2.5 % cream Apply topically 2 (two) times daily. Use for up to 14 days 30 g 1  . lisinopril (PRINIVIL,ZESTRIL) 20 MG tablet Take 1 tablet (20 mg total) by mouth 2 (two) times daily. 180 tablet 3  . amLODipine (NORVASC) 10 MG tablet Take 1 tablet (10 mg total) by mouth daily. Overdue for yearly physical w/labs must see MD for refills 30 tablet 0  . Diclofenac Sodium (PENNSAID) 2 % SOLN Place 1 application onto the skin 2 (two) times daily as needed. 112 g 1  . Naproxen-Esomeprazole 500-20 MG TBEC Take 1 tablet by mouth 2 (two) times daily as needed. 60 tablet 0  . tiZANidine (ZANAFLEX) 4 MG tablet Take 1 tablet (4 mg total)  by mouth every 6 (six) hours as needed for muscle spasms. 30 tablet 0   No facility-administered medications prior to visit.     ROS Review of Systems  Constitutional: Negative for activity change, appetite change, chills, diaphoresis, fatigue, fever and unexpected weight change.  HENT: Negative.  Negative for sinus pressure and trouble swallowing.   Eyes: Negative.   Respiratory: Negative.  Negative for cough, choking, chest tightness, shortness of breath, wheezing and stridor.   Cardiovascular: Negative for chest pain, palpitations and leg swelling.  Gastrointestinal: Negative.  Negative for abdominal pain, blood in stool, constipation, diarrhea, nausea and vomiting.  Endocrine: Negative.   Genitourinary: Negative.  Negative for difficulty urinating, discharge, penile swelling, scrotal swelling, testicular pain and urgency.  Musculoskeletal: Positive for arthralgias. Negative for back pain, gait problem, joint swelling, myalgias, neck pain and neck stiffness.  Skin: Positive for rash. Negative for color change, pallor and wound.  Allergic/Immunologic: Negative.   Neurological: Negative.  Negative for dizziness, tremors, syncope, weakness, light-headedness, numbness and headaches.  Hematological: Negative.  Negative for adenopathy. Does not bruise/bleed easily.  Psychiatric/Behavioral: Negative.     Objective:  BP 116/82 (BP Location: Left Arm, Patient Position: Sitting, Cuff Size: Normal)   Pulse 64   Temp 98.1 F (36.7 C) (Oral)   Resp 16   Ht 5\' 7"  (1.702 m)  Wt 214 lb 1.9 oz (97.1 kg)   SpO2 98%   BMI 33.54 kg/m   BP Readings from Last 3 Encounters:  05/09/16 116/82  01/20/16 110/80  10/07/15 112/70    Wt Readings from Last 3 Encounters:  05/09/16 214 lb 1.9 oz (97.1 kg)  01/20/16 209 lb 12.8 oz (95.2 kg)  10/07/15 208 lb (94.3 kg)    Physical Exam  Constitutional: He is oriented to person, place, and time. No distress.  HENT:  Head: Normocephalic and  atraumatic.  Mouth/Throat: Oropharynx is clear and moist. No oropharyngeal exudate.  Eyes: Conjunctivae are normal. Right eye exhibits no discharge. Left eye exhibits no discharge. No scleral icterus.  Neck: Normal range of motion. Neck supple. No JVD present. No tracheal deviation present. No thyromegaly present.  Cardiovascular: Normal rate, regular rhythm, normal heart sounds and intact distal pulses.  Exam reveals no gallop and no friction rub.   No murmur heard. Pulmonary/Chest: Effort normal and breath sounds normal. No stridor. No respiratory distress. He has no wheezes. He has no rales. He exhibits no tenderness.  Abdominal: Soft. Bowel sounds are normal. He exhibits no distension and no mass. There is no tenderness. There is no rebound and no guarding. Hernia confirmed negative in the right inguinal area and confirmed negative in the left inguinal area.  Genitourinary: Rectum normal, testes normal and penis normal. Rectal exam shows no external hemorrhoid, no internal hemorrhoid, no fissure, no mass, no tenderness, anal tone normal and guaiac negative stool. Prostate is enlarged (1+ smooth symm BPH). Prostate is not tender. Right testis shows no mass, no swelling and no tenderness. Right testis is descended. Left testis shows no mass, no swelling and no tenderness. Left testis is descended. Circumcised. No penile erythema or penile tenderness. No discharge found.  Musculoskeletal: He exhibits no edema.       Right hand: He exhibits decreased range of motion, tenderness and deformity. He exhibits no swelling. Normal sensation noted. Normal strength noted.  There is DJD in the right [facility] joint  Lymphadenopathy:    He has no cervical adenopathy.       Right: No inguinal adenopathy present.       Left: No inguinal adenopathy present.  Neurological: He is oriented to person, place, and time.  Skin: Skin is warm and dry. No rash noted. He is not diaphoretic. No erythema. No pallor.  The skin  background is severe xerosis. Elsewhere he has patches of papules with scale that coalescent into patches. There are some on the face, the abdomen, and the upper and lower extremities.  Psychiatric: He has a normal mood and affect. His behavior is normal. Judgment and thought content normal.  Vitals reviewed.   Lab Results  Component Value Date   WBC 6.9 05/09/2016   HGB 13.9 05/09/2016   HCT 40.8 05/09/2016   PLT 180.0 05/09/2016   GLUCOSE 109 (H) 05/09/2016   CHOL 199 05/09/2016   TRIG 275.0 (H) 05/09/2016   HDL 34.50 (L) 05/09/2016   LDLDIRECT 136.0 05/09/2016   LDLCALC 116 (H) 01/02/2013   ALT 29 05/09/2016   AST 22 05/09/2016   NA 136 05/09/2016   K 4.8 05/09/2016   CL 102 05/09/2016   CREATININE 1.46 05/09/2016   BUN 18 05/09/2016   CO2 29 05/09/2016   TSH 0.41 05/09/2016   PSA 0.48 05/09/2016   HGBA1C 5.6 04/05/2010    Ct Abdomen Pelvis W Contrast  Result Date: 10/08/2014 CLINICAL DATA:  Long-standing abdominal pain  with increase in frequency, diarrhea EXAM: CT ABDOMEN AND PELVIS WITH CONTRAST TECHNIQUE: Multidetector CT imaging of the abdomen and pelvis was performed using the standard protocol following bolus administration of intravenous contrast. CONTRAST:  123mL OMNIPAQUE IOHEXOL 300 MG/ML  SOLN COMPARISON:  None. FINDINGS: The lung bases are free of acute infiltrate or sizable effusion. The liver is diffusely fatty infiltrated. The gallbladder has been surgically removed. The spleen, adrenal glands and pancreas are all normal in their CT appearance. The kidneys demonstrate a normal enhancement pattern with the exception of a few small cortical cysts. Delayed images demonstrate normal excretion of contrast material. A hiatal hernia is noted. The appendix is well visualized and within normal limits. Scattered diverticular change of the sigmoid colon is seen without evidence of diverticulitis. The bladder is well distended. No pelvic mass lesion or sidewall abnormality is  seen. No acute bony abnormality is seen. IMPRESSION: Fatty liver. Bilateral renal cysts. Diverticulosis without diverticulitis. No acute abnormality is seen. Electronically Signed   By: Inez Catalina M.D.   On: 10/08/2014 08:34    Assessment & Plan:   Alvarez was seen today for annual exam, rash, hypertension and osteoarthritis.  Diagnoses and all orders for this visit:  Idiopathic gout, unspecified chronicity, unspecified site- his uric acid level is slightly elevated but he has had no recent episodes of gout and today his renal function is slightly diminished so will hold off on starting is anything oxidase inhibitor at this time. -     Uric acid; Future  Routine general medical examination at a health care facility- exam completed, labs ordered and reviewed, vaccines reviewed and updated, his colonoscopy is up-to-date, patient education material was given. -     Lipid panel; Future -     Comprehensive metabolic panel; Future -     CBC with Differential/Platelet; Future -     PSA; Future -     TSH; Future -     Hepatitis C antibody; Future -     HIV antibody; Future  Dermatitis, atopic- since he has not responded well to topical steroids and oral antihistamines I will try Eucrisa -     Crisaborole (EUCRISA) 2 % OINT; Apply 1 application topically 2 (two) times daily.  Essential hypertension- will stop the ACE inhibitor at his request, his renal function has declined some so this is a good idea at this time, will continue the amlodipine and metoprolol for blood pressure control.  [facility] DJD(carpometacarpal degenerative joint disease), localized primary, right -     Ambulatory referral to Orthopedic Surgery   I have discontinued Mr. Alavi lisinopril, hydrocortisone, Diclofenac Sodium, Naproxen-Esomeprazole, and tiZANidine. I am also having him start on Crisaborole. Additionally, I am having him maintain his amLODipine, finasteride, metoprolol, and omeprazole.  Meds ordered this  encounter  Medications  . Crisaborole (EUCRISA) 2 % OINT    Sig: Apply 1 application topically 2 (two) times daily.    Dispense:  120 g    Refill:  11     Follow-up: Return in about 4 months (around 09/08/2016).  Scarlette Calico, MD

## 2016-05-09 NOTE — Progress Notes (Signed)
Pre visit review using our clinic review tool, if applicable. No additional management support is needed unless otherwise documented below in the visit note. 

## 2016-05-10 ENCOUNTER — Encounter: Payer: Self-pay | Admitting: Internal Medicine

## 2016-05-10 LAB — HIV ANTIBODY (ROUTINE TESTING W REFLEX): HIV 1&2 Ab, 4th Generation: NONREACTIVE

## 2016-05-10 LAB — HEPATITIS C ANTIBODY: HCV Ab: NEGATIVE

## 2016-06-06 ENCOUNTER — Other Ambulatory Visit: Payer: Self-pay | Admitting: Internal Medicine

## 2016-06-06 DIAGNOSIS — I10 Essential (primary) hypertension: Secondary | ICD-10-CM

## 2016-07-09 ENCOUNTER — Encounter: Payer: Self-pay | Admitting: Internal Medicine

## 2016-07-09 DIAGNOSIS — I10 Essential (primary) hypertension: Secondary | ICD-10-CM

## 2016-07-09 MED ORDER — AMLODIPINE BESYLATE 10 MG PO TABS: 10.0000 mg | ORAL_TABLET | Freq: Every day | ORAL | 3 refills | Status: DC

## 2016-08-09 ENCOUNTER — Encounter: Payer: Self-pay | Admitting: Internal Medicine

## 2016-08-09 ENCOUNTER — Ambulatory Visit (INDEPENDENT_AMBULATORY_CARE_PROVIDER_SITE_OTHER): Payer: BLUE CROSS/BLUE SHIELD | Admitting: Internal Medicine

## 2016-08-09 DIAGNOSIS — S0340XA Sprain of jaw, unspecified side, initial encounter: Secondary | ICD-10-CM

## 2016-08-09 MED ORDER — PREDNISONE 20 MG PO TABS: 40.0000 mg | ORAL_TABLET | Freq: Every day | ORAL | 0 refills | Status: DC

## 2016-08-09 NOTE — Assessment & Plan Note (Signed)
Advised to cut back on nuts and use ice, ibuprofen. Rx for prednisone to help with pain. Given strategies to prevent recurrence.

## 2016-08-09 NOTE — Progress Notes (Signed)
   Subjective:    Patient ID: Eugene Mccullough, male    DOB: [birth year], 55 y.o.   MRN: [id number]  HPI The patient is a 55 YO man coming in for jaw pain on the right and left. He does eat a significant amount of nuts daily and chews them intermittently. They make the pain worse. He has never had this pain before. Some pain into his ear as well. Not down into his neck. No bad teeth or abscess. His jaw does not lock up on him.   Review of Systems  Constitutional: Negative for activity change, appetite change, fatigue, fever and unexpected weight change.  HENT: Negative.   Eyes: Negative.   Respiratory: Negative.   Cardiovascular: Negative.   Gastrointestinal: Negative.   Musculoskeletal: Positive for arthralgias. Negative for back pain, gait problem, myalgias, neck pain and neck stiffness.      Objective:   Physical Exam  Constitutional: He appears well-developed and well-nourished.  HENT:  Head: Normocephalic and atraumatic.  Ears normal, TM normal bilaterally. No pain in the neck, no bad teeth visible. Some cracking with bite  Eyes: EOM are normal.  Neck: Normal range of motion.  Cardiovascular: Normal rate and regular rhythm.   Pulmonary/Chest: Effort normal and breath sounds normal. No respiratory distress. He has no wheezes. He has no rales.  Abdominal: Soft.  Skin: Skin is warm and dry.   Vitals:   08/09/16 1524  BP: 108/76  Pulse: 76  Resp: 16  Temp: 98.2 F (36.8 C)  TempSrc: Oral  SpO2: 98%  Weight: 215 lb (97.5 kg)  Height: 5\' 7"  (1.702 m)      Assessment & Plan:

## 2016-08-09 NOTE — Progress Notes (Signed)
Pre visit review using our clinic review tool, if applicable. No additional management support is needed unless otherwise documented below in the visit note. 

## 2016-08-09 NOTE — Patient Instructions (Signed)
We have sent in prednisone for the jaw. Take 2 pills daily for 4 days.   You can use ibuprofen or aleve for the pain to help.   It is okay to ice the jaw as well.   Temporomandibular Joint Syndrome Temporomandibular joint (TMJ) syndrome is a condition that affects the joints between your jaw and your skull. The TMJs are located near your ears and allow your jaw to open and close. These joints and the nearby muscles are involved in all movements of the jaw. People with TMJ syndrome have pain in the area of these joints and muscles. Chewing, biting, or other movements of the jaw can be difficult or painful. TMJ syndrome can be caused by various things. In many cases, the condition is mild and goes away within a few weeks. For some people, the condition can become a long-term problem. What are the causes? Possible causes of TMJ syndrome include:  Grinding your teeth or clenching your jaw. Some people do this when they are under stress.  Arthritis.  Injury to the jaw.  Head or neck injury.  Teeth or dentures that are not aligned well. In some cases, the cause of TMJ syndrome may not be known. What are the signs or symptoms? The most common symptom is an aching pain on the side of the head in the area of the TMJ. Other symptoms may include:  Pain when moving your jaw, such as when chewing or biting.  Being unable to open your jaw all the way.  Making a clicking sound when you open your mouth.  Headache.  Earache.  Neck or shoulder pain. How is this diagnosed? Diagnosis can usually be made based on your symptoms, your medical history, and a physical exam. Your health care provider may check the range of motion of your jaw. Imaging tests, such as X-rays or an MRI, are sometimes done. You may need to see your dentist to determine if your teeth and jaw are lined up correctly. How is this treated? TMJ syndrome often goes away on its own. If treatment is needed, the options may  include:  Eating soft foods and applying ice or heat.  Medicines to relieve pain or inflammation.  Medicines to relax the muscles.  A splint, bite plate, or mouthpiece to prevent teeth grinding or jaw clenching.  Relaxation techniques or counseling to help reduce stress.  Transcutaneous electrical nerve stimulation (TENS). This helps to relieve pain by applying an electrical current through the skin.  Acupuncture. This is sometimes helpful to relieve pain.  Jaw surgery. This is rarely needed. Follow these instructions at home:  Take medicines only as directed by your health care provider.  Eat a soft diet if you are having trouble chewing.  Apply ice to the painful area.  Put ice in a plastic bag.  Place a towel between your skin and the bag.  Leave the ice on for 20 minutes, 2-3 times a day.  Apply a warm compress to the painful area as directed.  Massage your jaw area and perform any jaw stretching exercises as recommended by your health care provider.  If you were given a mouthpiece or bite plate, wear it as directed.  Avoid foods that require a lot of chewing. Do not chew gum.  Keep all follow-up visits as directed by your health care provider. This is important. Contact a health care provider if:  You are having trouble eating.  You have new or worsening symptoms. Get help right away  if:  Your jaw locks open or closed. This information is not intended to replace advice given to you by your health care provider. Make sure you discuss any questions you have with your health care provider. Document Released: 05/08/2001 Document Revised: 04/12/2016 Document Reviewed: 03/18/2014 Elsevier Interactive Patient Education  2017 [employer].

## 2016-08-09 NOTE — Telephone Encounter (Signed)
Pt is in today to see Sharlet Salina and he is asking about the eczema cream. Is the Eucrisa Ointment rx'ed to treat that. If so, I will contact the pharmacy to fill for patient.

## 2016-11-10 ENCOUNTER — Other Ambulatory Visit: Payer: Self-pay | Admitting: Internal Medicine

## 2016-11-10 ENCOUNTER — Other Ambulatory Visit: Payer: Self-pay | Admitting: Nurse Practitioner

## 2016-12-01 ENCOUNTER — Other Ambulatory Visit: Payer: Self-pay | Admitting: Internal Medicine

## 2016-12-01 DIAGNOSIS — I10 Essential (primary) hypertension: Secondary | ICD-10-CM

## 2017-02-25 ENCOUNTER — Ambulatory Visit (INDEPENDENT_AMBULATORY_CARE_PROVIDER_SITE_OTHER): Payer: BLUE CROSS/BLUE SHIELD | Admitting: Family Medicine

## 2017-02-25 ENCOUNTER — Encounter: Payer: Self-pay | Admitting: Family Medicine

## 2017-02-25 ENCOUNTER — Other Ambulatory Visit (INDEPENDENT_AMBULATORY_CARE_PROVIDER_SITE_OTHER): Payer: BLUE CROSS/BLUE SHIELD

## 2017-02-25 VITALS — BP 110/74 | HR 65 | Temp 98.0°F | Resp 12 | Ht 67.0 in | Wt 212.0 lb

## 2017-02-25 DIAGNOSIS — Z6833 Body mass index (BMI) 33.0-33.9, adult: Secondary | ICD-10-CM

## 2017-02-25 DIAGNOSIS — R079 Chest pain, unspecified: Secondary | ICD-10-CM | POA: Diagnosis not present

## 2017-02-25 DIAGNOSIS — H04203 Unspecified epiphora, bilateral lacrimal glands: Secondary | ICD-10-CM

## 2017-02-25 DIAGNOSIS — E785 Hyperlipidemia, unspecified: Secondary | ICD-10-CM

## 2017-02-25 DIAGNOSIS — M2550 Pain in unspecified joint: Secondary | ICD-10-CM | POA: Diagnosis not present

## 2017-02-25 DIAGNOSIS — L209 Atopic dermatitis, unspecified: Secondary | ICD-10-CM

## 2017-02-25 LAB — CBC
HEMATOCRIT: 40.3 % (ref 39.0–52.0)
HEMOGLOBIN: 13.2 g/dL (ref 13.0–17.0)
MCHC: 32.7 g/dL (ref 30.0–36.0)
MCV: 78 fl (ref 78.0–100.0)
PLATELETS: 170 10*3/uL (ref 150.0–400.0)
RBC: 5.17 Mil/uL (ref 4.22–5.81)
RDW: 16.2 % — ABNORMAL HIGH (ref 11.5–15.5)
WBC: 5.9 10*3/uL (ref 4.0–10.5)

## 2017-02-25 LAB — COMPREHENSIVE METABOLIC PANEL
ALBUMIN: 4.2 g/dL (ref 3.5–5.2)
ALT: 43 U/L (ref 0–53)
AST: 30 U/L (ref 0–37)
Alkaline Phosphatase: 73 U/L (ref 39–117)
BUN: 14 mg/dL (ref 6–23)
CALCIUM: 10.1 mg/dL (ref 8.4–10.5)
CO2: 29 meq/L (ref 19–32)
CREATININE: 0.94 mg/dL (ref 0.40–1.50)
Chloride: 101 mEq/L (ref 96–112)
GFR: 106.6 mL/min (ref >60.00)
Glucose, Bld: 84 mg/dL (ref 70–99)
POTASSIUM: 3.9 meq/L (ref 3.5–5.1)
Sodium: 138 mEq/L (ref 135–145)
Total Bilirubin: 0.3 mg/dL (ref 0.2–1.2)
Total Protein: 8.4 g/dL — ABNORMAL HIGH (ref 6.0–8.3)

## 2017-02-25 LAB — LIPID PANEL
CHOLESTEROL: 183 mg/dL (ref 0–200)
HDL: 38.6 mg/dL — AB (ref >39.00)
LDL Cholesterol: 109 mg/dL — ABNORMAL HIGH (ref 0–99)
NonHDL: 144.5
TRIGLYCERIDES: 179 mg/dL — AB (ref 0.0–149.0)
Total CHOL/HDL Ratio: 5
VLDL: 35.8 mg/dL (ref 0.0–40.0)

## 2017-02-25 LAB — HEMOGLOBIN A1C: HEMOGLOBIN A1C: 5.9 % (ref 4.6–6.5)

## 2017-02-25 LAB — SEDIMENTATION RATE: Sed Rate: 32 mm/hr — ABNORMAL HIGH (ref 0–20)

## 2017-02-25 LAB — URIC ACID: Uric Acid, Serum: 6.7 mg/dL (ref 4.0–7.8)

## 2017-02-25 LAB — TSH: TSH: 0.42 u[IU]/mL (ref 0.35–4.50)

## 2017-02-25 MED ORDER — HYDROCORTISONE 2.5 % EX CREA: TOPICAL_CREAM | Freq: Two times a day (BID) | CUTANEOUS | 1 refills | Status: DC

## 2017-02-25 MED ORDER — CRISABOROLE 2 % EX OINT: 1.0000 "application " | TOPICAL_OINTMENT | Freq: Two times a day (BID) | CUTANEOUS | 11 refills | Status: DC

## 2017-02-25 MED ORDER — ATORVASTATIN CALCIUM 10 MG PO TABS: 10.0000 mg | ORAL_TABLET | Freq: Every day | ORAL | 2 refills | Status: DC

## 2017-02-25 NOTE — Progress Notes (Signed)
Subjective:    Patient ID: Eugene Mccullough, male    DOB: 03/21/1961, 56 y.o.   MRN: [id number]  HPI This is a 56 yo male who presents today with muscle and joint pain x 1 month, more with activity. Has had ankle pain, wrist pain, ankle swelling, doesn't bother him much. Has felt more cold lately.  Any activity brings on chest pain- mowing the lawn, lifting. Chest pain is across chest, relieved with rest, no radiation, no nausea, no diaphoresis occasional SOB. Pain for about 5 minutes. Never while sleeping, only with activity. No family history of heart disease.  Watery eyes for several weeks. Some itching. Has used some over the counter Clear Away without much relief. Not sure if worse with being outside.  Requests refill for topical treatments for atopic dermatitis.  Past Medical History:  Diagnosis Date  . Gastric ulcer   . GERD (gastroesophageal reflux disease)   . Gout   . H. pylori infection   . IBS (irritable bowel syndrome)    Past Surgical History:  Procedure Laterality Date  . CHOLECYSTECTOMY  2005   Family History  Problem Relation Age of Onset  . Hyperlipidemia Father   . Cancer Neg Hx   . Early death Neg Hx   . Heart disease Neg Hx   . Hypertension Neg Hx   . Kidney disease Neg Hx   . Stroke Neg Hx   . Alcohol abuse Neg Hx   . Diabetes Neg Hx   . Crohn's disease Brother    Social History  Substance Use Topics  . Smoking status: Former Smoker    Packs/day: 1.00    Years: 20.00    Types: Cigarettes    Quit date: 09/27/2012  . Smokeless tobacco: Never Used  . Alcohol use No      Review of Systems Per HPI    Objective:   Physical Exam  Constitutional: He is oriented to person, place, and time. He appears well-developed and well-nourished. No distress.  HENT:  Head: Normocephalic and atraumatic.  Mouth/Throat: Oropharynx is clear and moist.  Eyes: Conjunctivae and EOM are normal. Right eye exhibits no discharge. Left eye exhibits no discharge. No scleral  icterus.  Neck: Normal range of motion. Neck supple. No thyromegaly present.  Cardiovascular: Normal rate, regular rhythm and normal heart sounds.   Pulmonary/Chest: Effort normal and breath sounds normal.  Musculoskeletal: He exhibits no edema.  Bilateral hands without joint or soft tissue edema. Full ROM. Wrists without edema or tenderness. Ankles without edema or tenderness.   Lymphadenopathy:    He has no cervical adenopathy.  Neurological: He is alert and oriented to person, place, and time.  Skin: Skin is warm and dry. He is not diaphoretic.  Vitals reviewed.        BP 110/74 (BP Location: Left Arm, Patient Position: Sitting, Cuff Size: Normal)   Pulse 65   Temp 98 F (36.7 C) (Oral)   Resp 12   Ht 5\' 7"  (1.702 m)   Wt 212 lb (96.2 kg)   SpO2 100%   BMI 33.20 kg/m  Wt Readings from Last 3 Encounters:  02/25/17 212 lb (96.2 kg)  08/09/16 215 lb (97.5 kg)  05/09/16 214 lb 1.9 oz (97.1 kg)   EKG- NSR, no changes from previous EKG Rate 69, PR 172, QT 380, QRSD 114  Assessment & Plan:  1. Chest pain, unspecified type - possible angina, he was instructed to start ASA 81 mg, will refer to cardiology, if  he has any persistent chest pain or pain at rest he is to call 911 - EKG 12-Lead - CBC; Future - Comprehensive metabolic panel; Future - Hemoglobin A1c; Future - Lipid panel; Future - TSH; Future - Ambulatory referral to Cardiology - atorvastatin (LIPITOR) 10 MG tablet; Take 1 tablet (10 mg total) by mouth daily.  Dispense: 30 tablet; Refill: 2  2. Atopic dermatitis, unspecified type - hydrocortisone 2.5 % cream; Apply topically 2 (two) times daily. Use for up to 14 days  Dispense: 28.35 g; Refill: 1 - Crisaborole (EUCRISA) 2 % OINT; Apply 1 application topically 2 (two) times daily.  Dispense: 120 g; Refill: 11  3. Arthralgia, unspecified joint - CBC; Future - Comprehensive metabolic panel; Future - TSH; Future - Sedimentation rate; Future - Uric acid; Future -  Rheumatoid Factor; Future  4. BMI 33.0-33.9,adult - encouraged weight loss through healthy eating- decrease sugar in coffee/tea, cut back on bread/rice - Hemoglobin A1c; Future  5. Hyperlipidemia, unspecified hyperlipidemia type - will recheck lipid panel today, discussed previous elevated VLDL/low HDL and discussed starting statin.  - Comprehensive metabolic panel; Future - Hemoglobin A1c; Future - Lipid panel; Future - atorvastatin (LIPITOR) 10 MG tablet; Take 1 tablet (10 mg total) by mouth daily.  Dispense: 30 tablet; Refill: 2  6. Watery eyes - exam unremarkable today, not sure if related to allergies since he has been outside doing yard work recently.  - suggested OTC antihistamine eye drops   Clarene Reamer, FNP-BC  [facility] [place], [place]  02/26/2017 9:29 PM

## 2017-02-25 NOTE — Patient Instructions (Addendum)
Please start an aspirin a day- 81 mg  I have sent in a low dose medication for cholesterol  I have referred you to a cardiologist, if you don't get a call by Thursday about an appointment, please call the office.  If you have chest pain that is not relieved with rest, call 911

## 2017-02-26 LAB — RHEUMATOID FACTOR

## 2017-02-28 NOTE — Addendum Note (Signed)
Addended by: Clarene Reamer B on: 02/28/2017 10:49 AM   Modules accepted: Level of Service

## 2017-03-26 ENCOUNTER — Other Ambulatory Visit: Payer: Self-pay

## 2017-03-26 DIAGNOSIS — E785 Hyperlipidemia, unspecified: Secondary | ICD-10-CM

## 2017-03-26 DIAGNOSIS — R079 Chest pain, unspecified: Secondary | ICD-10-CM

## 2017-03-26 MED ORDER — ATORVASTATIN CALCIUM 10 MG PO TABS: 10.0000 mg | ORAL_TABLET | Freq: Every day | ORAL | 1 refills | Status: DC

## 2017-04-26 ENCOUNTER — Encounter: Payer: Self-pay | Admitting: Internal Medicine

## 2017-04-26 ENCOUNTER — Ambulatory Visit (INDEPENDENT_AMBULATORY_CARE_PROVIDER_SITE_OTHER): Payer: BLUE CROSS/BLUE SHIELD | Admitting: Internal Medicine

## 2017-04-26 VITALS — BP 102/76 | HR 81 | Ht 67.0 in | Wt 212.4 lb

## 2017-04-26 DIAGNOSIS — E785 Hyperlipidemia, unspecified: Secondary | ICD-10-CM

## 2017-04-26 DIAGNOSIS — I10 Essential (primary) hypertension: Secondary | ICD-10-CM

## 2017-04-26 DIAGNOSIS — R0602 Shortness of breath: Secondary | ICD-10-CM | POA: Diagnosis not present

## 2017-04-26 DIAGNOSIS — R079 Chest pain, unspecified: Secondary | ICD-10-CM

## 2017-04-26 NOTE — Progress Notes (Signed)
OFFICE CONSULT NOTE  Chief Complaint:  Chest pain  Primary Care Physician: Janith Lima, MD  HPI:  Eugene Mccullough is a 56 y.o. male who is being seen today for the evaluation of chest pain at the request of Elby Beck, FNP. History Eugene Mccullough is a pleasant 56 year old male who is referred for chest pain and shortness of breath with exertion. Past medical history significant for hypertension, dyslipidemia, reflux, and a 26-pack-year history of smoking, quit in 2014. 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 He says he gets tightness in his chest when he does this that in proves when he rests. He also gets some chest tightness when mowing his lawn that improves with rest. No family history of early-onset heart disease that he is aware off. He describes the pressure as a squeezing, nonradiating, 4-5 out of 10 in intensity and improves again really quickly with rest.  PMHx:  Past Medical History:  Diagnosis Date  . Gastric ulcer   . GERD (gastroesophageal reflux disease)   . Gout   . H. pylori infection   . Hyperlipidemia   . Hypertension   . IBS (irritable bowel syndrome)     Past Surgical History:  Procedure Laterality Date  . CHOLECYSTECTOMY  2005    FAMHx:  Family History  Problem Relation Age of Onset  . Hyperlipidemia Father   . Crohn's disease Brother   . Cancer Neg Hx   . Early death Neg Hx   . Heart disease Neg Hx   . Hypertension Neg Hx   . Kidney disease Neg Hx   . Stroke Neg Hx   . Alcohol abuse Neg Hx   . Diabetes Neg Hx     SOCHx:   reports that he quit smoking about 4 years ago. His smoking use included Cigarettes. He has a 20.00 pack-year smoking history. He has never used smokeless tobacco. He reports that he does not drink alcohol or use drugs.  ALLERGIES:  Allergies  Allergen Reactions  .  Hydrocodone-Acetaminophen Itching    ROS: Pertinent items noted in HPI and remainder of comprehensive ROS otherwise negative.  HOME MEDS: Current Outpatient Prescriptions on File Prior to Visit  Medication Sig Dispense Refill  . amLODipine (NORVASC) 10 MG tablet Take 1 tablet (10 mg total) by mouth daily. 90 tablet 3  . atorvastatin (LIPITOR) 10 MG tablet Take 1 tablet (10 mg total) by mouth daily. 90 tablet 1  . Crisaborole (EUCRISA) 2 % OINT Apply 1 application topically 2 (two) times daily. 120 g 11  . finasteride (PROSCAR) 5 MG tablet Take 1 tablet (5 mg total) by mouth daily. 90 tablet 3  . hydrocortisone 2.5 % cream Apply topically 2 (two) times daily. Use for up to 14 days 28.35 g 1  . lisinopril (PRINIVIL,ZESTRIL) 20 MG tablet TAKE 1 TABLET BY MOUTH TWICE A DAY 180 tablet 1  . metoprolol (LOPRESSOR) 50 MG tablet TAKE 1 TABLET BY MOUTH 2 TIMES DAILY 180 tablet 1  . omeprazole (PRILOSEC) 40 MG capsule Take 1 capsule (40 mg total) by mouth 2 (two) times daily. 180 capsule 1  . omeprazole (PRILOSEC) 40 MG capsule TAKE 1 CAPSULE TWICE A DAY 180 capsule 1  . predniSONE (DELTASONE) 20 MG tablet Take 2 tablets (40 mg total) by mouth daily with breakfast. 8 tablet 0   No current facility-administered medications on file prior  to visit.     LABS/IMAGING: No results found for this or any previous visit (from the past 48 hour(s)). No results found.  LIPID PANEL:    Component Value Date/Time   CHOL 183 02/25/2017 1509   TRIG 179.0 (H) 02/25/2017 1509   HDL 38.60 (L) 02/25/2017 1509   CHOLHDL 5 02/25/2017 1509   VLDL 35.8 02/25/2017 1509   LDLCALC 109 (H) 02/25/2017 1509   LDLDIRECT 136.0 05/09/2016 1606    WEIGHTS: Wt Readings from Last 3 Encounters:  04/26/17 212 lb 6.4 oz (96.3 kg)  02/25/17 212 lb (96.2 kg)  08/09/16 215 lb (97.5 kg)    VITALS: BP 102/76   Pulse 81   Ht 5\' 7"  (1.702 m)   Wt 212 lb 6.4 oz (96.3 kg)   BMI 33.27 kg/m   EXAM: General appearance: alert  and no distress Neck: no carotid bruit, no JVD and thyroid not enlarged, symmetric, no tenderness/mass/nodules Lungs: clear to auscultation bilaterally Heart: regular rate and rhythm, S1, S2 normal, no murmur, click, rub or gallop Abdomen: soft, non-tender; bowel sounds normal; no masses,  no organomegaly Extremities: extremities normal, atraumatic, no cyanosis or edema Pulses: 2+ and symmetric Skin: Skin color, texture, turgor normal. No rashes or lesions Neurologic: Grossly normal Psych: Pleasant  EKG: Normal sinus rhythm at 81, nonspecific ST changes - personally reviewed  ASSESSMENT: 1. Precordial chest pain-possible unstable angina 2. Hypertension 3. Dyslipidemia 4. History of tobacco abuse  PLAN: 1.   Eugene Mccullough is describing worsening chest tightness with exertion and shortness of breath. The symptoms are concerning for possible unstable angina, however could be related to COPD given his history of tobacco abuse. He denies wheezing or worsening productive cough. EKG is not suggestive of ischemia. I like for him to undergo exercise Myoview stress testing as he is at moderate pretest probability for coronary disease. Follow-up with me afterwards.  Thanks for the consultation.  Pixie Casino, MD, [place]  Attending Cardiologist  Direct Dial: [phone]  Fax: [phone]  Website:  [email or web address] 04/26/2017, 4:54 PM

## 2017-04-26 NOTE — Patient Instructions (Addendum)
Your physician has requested that you have an exercise stress myoview. For further information please visit [email or web address]. Please follow instruction sheet, as given.  Your physician recommends that you schedule a follow-up appointment with Dr. Debara Pickett after your testing.

## 2017-04-30 ENCOUNTER — Telehealth (HOSPITAL_COMMUNITY): Payer: Self-pay

## 2017-04-30 NOTE — Telephone Encounter (Signed)
Encounter complete. 

## 2017-05-01 ENCOUNTER — Ambulatory Visit (HOSPITAL_COMMUNITY)
Admission: RE | Admit: 2017-05-01 | Discharge: 2017-05-01 | Disposition: A | Discharge status: Home / Self Care | Payer: BLUE CROSS/BLUE SHIELD | Source: Ambulatory Visit | Attending: Cardiology | Admitting: Cardiology

## 2017-05-01 DIAGNOSIS — R079 Chest pain, unspecified: Secondary | ICD-10-CM | POA: Diagnosis not present

## 2017-05-01 DIAGNOSIS — Z87891 Personal history of nicotine dependence: Secondary | ICD-10-CM | POA: Diagnosis not present

## 2017-05-01 DIAGNOSIS — R0602 Shortness of breath: Secondary | ICD-10-CM | POA: Diagnosis not present

## 2017-05-01 DIAGNOSIS — I10 Essential (primary) hypertension: Secondary | ICD-10-CM

## 2017-05-01 DIAGNOSIS — E785 Hyperlipidemia, unspecified: Secondary | ICD-10-CM

## 2017-05-01 DIAGNOSIS — E663 Overweight: Secondary | ICD-10-CM | POA: Diagnosis not present

## 2017-05-01 LAB — MYOCARDIAL PERFUSION IMAGING
CHL CUP NUCLEAR SDS: 3
CHL CUP NUCLEAR SSS: 4
CHL RATE OF PERCEIVED EXERTION: 18
CSEPED: 8 min
CSEPEDS: 0 s
CSEPHR: 90 %
Estimated workload: 8.4 METS
LV dias vol: 98 mL (ref 62–150)
LV sys vol: 40 mL
MPHR: 164 {beats}/min
Peak HR: 148 {beats}/min
Rest HR: 73 {beats}/min
SRS: 1
TID: 0.89

## 2017-05-01 MED ORDER — TECHNETIUM TC 99M TETROFOSMIN IV KIT
10.3000 | PACK | Freq: Once | INTRAVENOUS | Status: AC | PRN
  Administered 2017-05-01: 10.3 via INTRAVENOUS
  Filled 2017-05-01: qty 11

## 2017-05-01 MED ORDER — TECHNETIUM TC 99M TETROFOSMIN IV KIT
29.6000 | PACK | Freq: Once | INTRAVENOUS | Status: AC | PRN
  Administered 2017-05-01: 29.6 via INTRAVENOUS
  Filled 2017-05-01: qty 30

## 2017-05-04 ENCOUNTER — Other Ambulatory Visit: Payer: Self-pay | Admitting: Internal Medicine

## 2017-05-18 ENCOUNTER — Ambulatory Visit (INDEPENDENT_AMBULATORY_CARE_PROVIDER_SITE_OTHER): Payer: BLUE CROSS/BLUE SHIELD | Admitting: Family Medicine

## 2017-05-18 ENCOUNTER — Encounter: Payer: Self-pay | Admitting: Family Medicine

## 2017-05-18 VITALS — BP 118/82 | HR 74 | Temp 98.8°F | Wt 214.0 lb

## 2017-05-18 DIAGNOSIS — R21 Rash and other nonspecific skin eruption: Secondary | ICD-10-CM | POA: Diagnosis not present

## 2017-05-18 MED ORDER — IMIQUIMOD 5 % EX CREA: TOPICAL_CREAM | CUTANEOUS | 2 refills | Status: DC

## 2017-05-18 NOTE — Progress Notes (Signed)
OFFICE VISIT  05/18/2017   CC:  Chief Complaint  Patient presents with  . Rash    groin... x 1 week...pt reports the area burns no itchiness...pt has not tried OTC meds   HPI:    Patient is a 56 y.o.  male who presents for skin complaint. Noted "spots" on top of penis--near/on the head of penis-- about a week ago.  Mildly uncomforable feeling, not really painful, not itching. Not tried any meds at this point. Has otherwise felt well and has not had this before. Has hx of eczema. Denies hx of STD.  Denies promiscuous sexual behavior, denies new sexual partner.  Past Medical History:  Diagnosis Date  . Gastric ulcer   . GERD (gastroesophageal reflux disease)   . Gout   . H. pylori infection   . Hyperlipidemia   . Hypertension   . IBS (irritable bowel syndrome)     Past Surgical History:  Procedure Laterality Date  . CHOLECYSTECTOMY  2005    Outpatient Medications Prior to Visit  Medication Sig Dispense Refill  . amLODipine (NORVASC) 10 MG tablet Take 1 tablet (10 mg total) by mouth daily. 90 tablet 3  . atorvastatin (LIPITOR) 10 MG tablet Take 1 tablet (10 mg total) by mouth daily. 90 tablet 1  . Crisaborole (EUCRISA) 2 % OINT Apply 1 application topically 2 (two) times daily. 120 g 11  . finasteride (PROSCAR) 5 MG tablet Take 1 tablet (5 mg total) by mouth daily. 90 tablet 3  . hydrocortisone 2.5 % cream Apply topically 2 (two) times daily. Use for up to 14 days 28.35 g 1  . lisinopril (PRINIVIL,ZESTRIL) 20 MG tablet TAKE 1 TABLET BY MOUTH TWICE A DAY 180 tablet 1  . metoprolol (LOPRESSOR) 50 MG tablet TAKE 1 TABLET BY MOUTH 2 TIMES DAILY 180 tablet 1  . omeprazole (PRILOSEC) 40 MG capsule TAKE 1 CAPSULE TWICE A DAY 180 capsule 1  . predniSONE (DELTASONE) 20 MG tablet Take 2 tablets (40 mg total) by mouth daily with breakfast. 8 tablet 0  . omeprazole (PRILOSEC) 40 MG capsule Take 1 capsule (40 mg total) by mouth 2 (two) times daily. 180 capsule 1   No  facility-administered medications prior to visit.     Allergies  Allergen Reactions  . Hydrocodone-Acetaminophen Itching   ROS As per HPI  PE: Blood pressure 118/82, pulse 74, temperature 98.8 F (37.1 C), temperature source Oral, weight 214 lb (97.1 kg), SpO2 99 %. Gen: Alert, well appearing.  Patient is oriented to person, place, time, and situation. Penis: some slightly pink/hypopigmented flat papules, 1-2 mm in size around the corona on dorsal aspect of penis. Nontender.  No ulcerations.  No groin adenopathy.  Question of verrucous appearance on surface of lesions when viewed with otoscope.  LABS:  none  IMPRESSION AND PLAN:  Penile lesions: possible mild/early condylomata acuminata. Will do trial of aldara cream three times per week x 6 wks. Encouraged pt to use protection with intercourse.  An After Visit Summary was printed and given to the patient.  FOLLOW UP: Return if symptoms worsen or fail to improve.  Signed:  Crissie Sickles, MD           05/18/2017

## 2017-05-21 ENCOUNTER — Telehealth: Payer: Self-pay | Admitting: Family Medicine

## 2017-05-21 NOTE — Telephone Encounter (Signed)
Patient called stating that he was here to see you on Saturday and was prescribedimiquimod (ALDARA) 5 % cream. He spoke with his pharmacy and they told him that his insurance company is needing authorization from the doctor stating that he needs this medication (sounds like a prior authorization). Do you know if you have received anything for this patient? If not, can the authorization be started?

## 2017-05-22 NOTE — Telephone Encounter (Signed)
I haven't received anything. Can you start authorization process? -thx

## 2017-05-22 NOTE — Telephone Encounter (Signed)
Can you help with this? Thanks

## 2017-05-23 ENCOUNTER — Encounter: Payer: Self-pay | Admitting: Family Medicine

## 2017-05-23 NOTE — Telephone Encounter (Signed)
Please advise. Thanks.  

## 2017-05-23 NOTE — Telephone Encounter (Signed)
Pt was seen in Saturday clinic.  Is there an alternative to Aldara 5% or should I go ahead with the PA?

## 2017-05-24 MED ORDER — IMIQUIMOD 5 % EX CREA: TOPICAL_CREAM | CUTANEOUS | 6 refills | Status: DC

## 2017-05-24 NOTE — Telephone Encounter (Signed)
OK. I sent in a new prescription that will dispense more packets and gave 6 RF's. The spots only need a tiny amount applied to them each time.  Continue applying three days per week for the next 6 weeks.  --PM

## 2017-05-28 ENCOUNTER — Other Ambulatory Visit: Payer: Self-pay | Admitting: Internal Medicine

## 2017-05-28 DIAGNOSIS — I10 Essential (primary) hypertension: Secondary | ICD-10-CM

## 2017-05-29 ENCOUNTER — Other Ambulatory Visit: Payer: Self-pay | Admitting: Internal Medicine

## 2017-05-29 MED ORDER — PODOFILOX 0.5 % EX SOLN: CUTANEOUS | 0 refills | Status: DC

## 2017-05-29 NOTE — Telephone Encounter (Signed)
Rx printed for generic condylox solution. Pls fax to his pharmacy. Patient should follow instructions on packaging label, but he should also ask the pharmacist for instructions on how to apply it to the warts.-thx

## 2017-05-30 ENCOUNTER — Other Ambulatory Visit: Payer: Self-pay | Admitting: Internal Medicine

## 2017-05-30 ENCOUNTER — Other Ambulatory Visit: Payer: Self-pay | Admitting: Family Medicine

## 2017-05-30 DIAGNOSIS — L209 Atopic dermatitis, unspecified: Secondary | ICD-10-CM

## 2017-05-30 DIAGNOSIS — I10 Essential (primary) hypertension: Secondary | ICD-10-CM

## 2017-06-05 ENCOUNTER — Encounter: Payer: Self-pay | Admitting: Internal Medicine

## 2017-06-05 ENCOUNTER — Ambulatory Visit (INDEPENDENT_AMBULATORY_CARE_PROVIDER_SITE_OTHER): Payer: BLUE CROSS/BLUE SHIELD | Admitting: Internal Medicine

## 2017-06-05 VITALS — BP 102/66 | HR 80 | Ht 67.0 in | Wt 211.0 lb

## 2017-06-05 DIAGNOSIS — R079 Chest pain, unspecified: Secondary | ICD-10-CM | POA: Diagnosis not present

## 2017-06-05 DIAGNOSIS — I10 Essential (primary) hypertension: Secondary | ICD-10-CM

## 2017-06-05 DIAGNOSIS — E785 Hyperlipidemia, unspecified: Secondary | ICD-10-CM | POA: Diagnosis not present

## 2017-06-05 NOTE — Progress Notes (Signed)
OFFICE CONSULT NOTE  Chief Complaint:  Follow-up stress test   Primary Care Physician: Janith Lima, MD  HPI:  Eugene Mccullough is a 56 y.o. male who is being seen today for the evaluation of chest pain at the request of Janith Lima, Eugene Mccullough is a pleasant 56 year old male who is referred for chest pain and shortness of breath with exertion. Past medical history significant for hypertension, dyslipidemia, reflux, and a 26-pack-year history of smoking, quit in 2014. 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 He says he gets tightness in his chest when he does this that in proves when he rests. He also gets some chest tightness when mowing his lawn that improves with rest. No family history of early-onset heart disease that he is aware off. He describes the pressure as a squeezing, nonradiating, 4-5 out of 10 in intensity and improves again really quickly with rest.  06/05/2017  Mr. [name] returns today for follow-up. He underwent nuclear stress testing which was negative for ischemia. He has some concerns about his medicines. He notably is taking aspirin on a daily basis but has concerns about side effects. He's trying to lose weight and work on getting off of some of his medications. He denies any further chest pain or worsening shortness of breath such as he had prior to his stress testing.  PMHx:  Past Medical History:  Diagnosis Date  . Gastric ulcer   . GERD (gastroesophageal reflux disease)   . Gout   . H. pylori infection   . Hyperlipidemia   . Hypertension   . IBS (irritable bowel syndrome)     Past Surgical History:  Procedure Laterality Date  . CHOLECYSTECTOMY  2005    FAMHx:  Family History  Problem Relation Age of Onset  . Hyperlipidemia Father   . Crohn's disease Brother   . Cancer Neg Hx   . Early death Neg  Hx   . Heart disease Neg Hx   . Hypertension Neg Hx   . Kidney disease Neg Hx   . Stroke Neg Hx   . Alcohol abuse Neg Hx   . Diabetes Neg Hx     SOCHx:   reports that he quit smoking about 4 years ago. His smoking use included Cigarettes. He has a 20.00 pack-year smoking history. He has never used smokeless tobacco. He reports that he does not drink alcohol or use drugs.  ALLERGIES:  Allergies  Allergen Reactions  . Hydrocodone-Acetaminophen Itching    ROS: Pertinent items noted in HPI and remainder of comprehensive ROS otherwise negative.  HOME MEDS: Current Outpatient Prescriptions on File Prior to Visit  Medication Sig Dispense Refill  . amLODipine (NORVASC) 10 MG tablet Take 1 tablet (10 mg total) by mouth daily. 90 tablet 3  . atorvastatin (LIPITOR) 10 MG tablet Take 1 tablet (10 mg total) by mouth daily. 90 tablet 1  . Crisaborole (EUCRISA) 2 % OINT Apply 1 application topically 2 (two) times daily. 120 g 11  . finasteride (PROSCAR) 5 MG tablet Take 1 tablet (5 mg total) by mouth daily. 90 tablet 3  . hydrocortisone 2.5 % cream Apply topically 2 (two) times daily. Use for up to 14 days 28.35 g 1  . imiquimod (ALDARA) 5 % cream Apply topically 3 (three) times a week. 24 each 6  . lisinopril (PRINIVIL,ZESTRIL) 20 MG tablet TAKE 1  TABLET BY MOUTH TWICE A DAY 180 tablet 1  . metoprolol (LOPRESSOR) 50 MG tablet TAKE 1 TABLET BY MOUTH 2 TIMES DAILY 180 tablet 1  . omeprazole (PRILOSEC) 40 MG capsule Take 1 capsule (40 mg total) by mouth 2 (two) times daily. 180 capsule 1  . podofilox (CONDYLOX) 0.5 % external solution Apply to affected areas twice daily for 3 consecutive days, then withhold use for 4 days.  Repeat this treatment cycle up to 4 times until no visible wart tissue is present. 3.5 mL 0  . predniSONE (DELTASONE) 20 MG tablet Take 2 tablets (40 mg total) by mouth daily with breakfast. 8 tablet 0   No current facility-administered medications on file prior to visit.      LABS/IMAGING: No results found for this or any previous visit (from the past 48 hour(s)). No results found.  LIPID PANEL:    Component Value Date/Time   CHOL 183 02/25/2017 1509   TRIG 179.0 (H) 02/25/2017 1509   HDL 38.60 (L) 02/25/2017 1509   CHOLHDL 5 02/25/2017 1509   VLDL 35.8 02/25/2017 1509   LDLCALC 109 (H) 02/25/2017 1509   LDLDIRECT 136.0 05/09/2016 1606    WEIGHTS: Wt Readings from Last 3 Encounters:  06/05/17 211 lb (95.7 kg)  05/18/17 214 lb (97.1 kg)  05/01/17 212 lb (96.2 kg)    VITALS: BP 102/66   Pulse 80   Ht 5\' 7"  (1.702 m)   Wt 211 lb (95.7 kg)   BMI 33.05 kg/m   EXAM: Deferred  EKG: Deferred   ASSESSMENT:  1. Precordial chest pain - low risk Myoview, negative for ischemia (04/2017) 2. Hypertension 3. Dyslipidemia 4. History of tobacco abuse  PLAN: 1.   Mr. Sternberg returns today for follow-up of his stress test. Fortunately that was negative. He has had no further symptoms. He does have cardio vascular risk factors and at least is at moderate risk. He's question whether or not he should be on daily aspirin. To be helpful to know if there was any coronary artery disease. One screening test for ASCVD is coronary artery calcium scoring. At age 75 this is ideal for testing. We'll plan to refer for coronary artery calcium score and if abnormal, will likely treat more aggressively advocate for daily aspirin.  Follow-up 6 months.  Pixie Casino, MD, [place]  Attending Cardiologist  Direct Dial: [phone]  Fax: [phone]  Website:  [email or web address] 06/05/2017, 4:43 PM

## 2017-06-05 NOTE — Patient Instructions (Signed)
Dr. Debara Pickett has ordered a CORONARY ARTERY CALCIUM STUDY. This is done at [address] - [address].   Your physician wants you to follow-up in: 6 months with Dr. Debara Pickett. You will receive a reminder letter in the mail two months in advance. If you don't receive a letter, please call our office to schedule the follow-up appointment.

## 2017-06-12 ENCOUNTER — Ambulatory Visit (INDEPENDENT_AMBULATORY_CARE_PROVIDER_SITE_OTHER)
Admission: RE | Admit: 2017-06-12 | Discharge: 2017-06-12 | Disposition: A | Discharge status: Home / Self Care | Payer: Self-pay | Source: Ambulatory Visit | Attending: Internal Medicine | Admitting: Internal Medicine

## 2017-06-12 DIAGNOSIS — E785 Hyperlipidemia, unspecified: Secondary | ICD-10-CM

## 2017-06-12 DIAGNOSIS — I10 Essential (primary) hypertension: Secondary | ICD-10-CM

## 2017-06-13 ENCOUNTER — Encounter: Payer: Self-pay | Admitting: Nurse Practitioner

## 2017-06-13 ENCOUNTER — Ambulatory Visit (INDEPENDENT_AMBULATORY_CARE_PROVIDER_SITE_OTHER): Payer: BLUE CROSS/BLUE SHIELD | Admitting: Nurse Practitioner

## 2017-06-13 VITALS — BP 100/68 | HR 67 | Temp 98.2°F | Ht 67.0 in | Wt 210.0 lb

## 2017-06-13 DIAGNOSIS — A630 Anogenital (venereal) warts: Secondary | ICD-10-CM | POA: Diagnosis not present

## 2017-06-13 MED ORDER — IMIQUIMOD 5 % EX CREA: TOPICAL_CREAM | CUTANEOUS | 0 refills | Status: DC

## 2017-06-13 NOTE — Progress Notes (Signed)
Subjective:  Patient ID: Eugene Mccullough, male    DOB: 1961/06/09  Age: 56 y.o. MRN: [id number]  CC: Rash (dry area near private area, 2 meds sent into doesnt cover by the pharamcy when he saw doc 05/18/2017. )   HPI He was diagnosed with genital wart 3weeks ago. Was prescribed Aldara but did not use medication as prescribed. [facility] will not dispense another refill because its too soon. Last Rx was dispensed 2 weeks ago. He has not noticed a change in rash with current prescription.  Outpatient Medications Prior to Visit  Medication Sig Dispense Refill  . amLODipine (NORVASC) 10 MG tablet Take 1 tablet (10 mg total) by mouth daily. 90 tablet 3  . atorvastatin (LIPITOR) 10 MG tablet Take 1 tablet (10 mg total) by mouth daily. 90 tablet 1  . Crisaborole (EUCRISA) 2 % OINT Apply 1 application topically 2 (two) times daily. 120 g 11  . finasteride (PROSCAR) 5 MG tablet Take 1 tablet (5 mg total) by mouth daily. 90 tablet 3  . hydrocortisone 2.5 % cream Apply topically 2 (two) times daily. Use for up to 14 days 28.35 g 1  . lisinopril (PRINIVIL,ZESTRIL) 20 MG tablet TAKE 1 TABLET BY MOUTH TWICE A DAY 180 tablet 1  . metoprolol (LOPRESSOR) 50 MG tablet TAKE 1 TABLET BY MOUTH 2 TIMES DAILY 180 tablet 1  . omeprazole (PRILOSEC) 40 MG capsule Take 1 capsule (40 mg total) by mouth 2 (two) times daily. 180 capsule 1  . predniSONE (DELTASONE) 20 MG tablet Take 2 tablets (40 mg total) by mouth daily with breakfast. 8 tablet 0  . imiquimod (ALDARA) 5 % cream Apply topically 3 (three) times a week. 24 each 6  . podofilox (CONDYLOX) 0.5 % external solution Apply to affected areas twice daily for 3 consecutive days, then withhold use for 4 days.  Repeat this treatment cycle up to 4 times until no visible wart tissue is present. (Patient not taking: Reported on 06/13/2017) 3.5 mL 0   No facility-administered medications prior to visit.     ROS See HPI  Objective:  BP 100/68   Pulse 67   Temp 98.2 F  (36.8 C)   Ht 5\' 7"  (1.702 m)   Wt 210 lb (95.3 kg)   SpO2 99%   BMI 32.89 kg/m   BP Readings from Last 3 Encounters:  06/13/17 100/68  06/05/17 102/66  05/18/17 118/82    Wt Readings from Last 3 Encounters:  06/13/17 210 lb (95.3 kg)  06/05/17 211 lb (95.7 kg)  05/18/17 214 lb (97.1 kg)    Physical Exam  Constitutional: He is oriented to person, place, and time.  Cardiovascular: Normal rate.   Pulmonary/Chest: Effort normal.  Genitourinary:  Genitourinary Comments: declined  Neurological: He is alert and oriented to person, place, and time.  Vitals reviewed.   Lab Results  Component Value Date   WBC 5.9 02/25/2017   HGB 13.2 02/25/2017   HCT 40.3 02/25/2017   PLT 170.0 02/25/2017   GLUCOSE 84 02/25/2017   CHOL 183 02/25/2017   TRIG 179.0 (H) 02/25/2017   HDL 38.60 (L) 02/25/2017   LDLDIRECT 136.0 05/09/2016   LDLCALC 109 (H) 02/25/2017   ALT 43 02/25/2017   AST 30 02/25/2017   NA 138 02/25/2017   K 3.9 02/25/2017   CL 101 02/25/2017   CREATININE 0.94 02/25/2017   BUN 14 02/25/2017   CO2 29 02/25/2017   TSH 0.42 02/25/2017   PSA 0.48 05/09/2016  HGBA1C 5.9 02/25/2017    Ct Cardiac Scoring  Addendum Date: 06/13/2017   ADDENDUM REPORT: 06/13/2017 14:10 CLINICAL DATA:  Risk stratification EXAM: Coronary Calcium Score TECHNIQUE: The patient was scanned on a [employer]. Axial non-contrast 3 mm slices were carried out through the heart. The data set was analyzed on a dedicated work station and scored using the [place]. FINDINGS: Non-cardiac: See separate report from [facility]. Ascending Aorta:  Normal size, no calcium. Pericardium: Normal. Coronary arteries:  Normal origin. IMPRESSION: Coronary calcium score of 4. This was 105 percentile for age and sex matched control. Ena Dawley Electronically Signed   By: Ena Dawley   On: 06/13/2017 14:10   Result Date: 06/13/2017 EXAM: OVER-READ INTERPRETATION  CT CHEST The following  report is an over-read performed by radiologist Dr. Alvino Blood [facility] on 06/12/2017. This over-read does not include interpretation of cardiac or coronary anatomy or pathology. The coronary calcium score interpretation by the cardiologist is attached. COMPARISON:  None. FINDINGS: Limited view of the lung parenchyma demonstrates no suspicious nodularity. Airways are normal. Limited view of the mediastinum demonstrates no adenopathy. Esophagus normal. Limited view of the upper abdomen unremarkable. Limited view of the skeleton and chest wall is unremarkable. IMPRESSION: No significant extracardiac findings Electronically Signed: By: Suzy Bouchard M.D. On: 06/12/2017 15:40    Assessment & Plan:   Lamberto was seen today for rash.  Diagnoses and all orders for this visit:  Genital warts -     imiquimod (ALDARA) 5 % cream; Apply topically 3 (three) times a week.   I am having Mr. Adams maintain his finasteride, amLODipine, predniSONE, metoprolol tartrate, hydrocortisone, Crisaborole, atorvastatin, lisinopril, omeprazole, podofilox, and imiquimod.  Meds ordered this encounter  Medications  . imiquimod (ALDARA) 5 % cream    Sig: Apply topically 3 (three) times a week.    Dispense:  12 each    Refill:  0    Order Specific Question:   Supervising Provider    Answer:   Binnie Rail [[id number]]    Follow-up: No Follow-up on file.  Wilfred Lacy, NP

## 2017-06-13 NOTE — Patient Instructions (Signed)
Genital Warts Genital warts are a common STD (sexually transmitted disease). They may appear as small bumps on the tissues of the genital area or anal area. Sometimes, they can become irritated and cause pain. Genital warts are easily passed to other people through sexual contact. Getting treatment is important because genital warts can lead to other problems. In females, the virus that causes genital warts may increase the risk of cervical cancer. What are the causes? Genital warts are caused by a virus that is called human papillomavirus (HPV). HPV is spread by having unprotected sex with an infected person. It can be spread through vaginal, anal, and oral sex. Many people do not know that they are infected. They may be infected for years without problems. However, even if they do not have problems, they can pass the infection to their sexual partners. What increases the risk? Genital warts are more likely to develop in:  People who have unprotected sex.  People who have multiple sexual partners.  People who become sexually active before they are 56 years of age.  Men who are not circumcised.  Women who have a male sexual partner who is not circumcised.  People who have a weakened body defense system (immune system) due to disease or medicine.  People who smoke.  What are the signs or symptoms? Symptoms of genital warts include:  Small growths in the genital area or anal area. These warts often grow in clusters.  Itching and irritation in the genital area or anal area.  Bleeding from the warts.  Painful sexual intercourse.  How is this diagnosed? Genital warts can usually be diagnosed from their appearance on the vagina, vulva, penis, perineum, anus, or rectum. Tests may also be done, such as:  Biopsy. A tissue sample is removed so it can be looked at under a microscope.  Colposcopy. In females, a magnifying tool is used to examine the vagina and cervix. Certain solutions may  be used to make the HPV cells change color so they can be seen more easily.  A Pap test in females.  Tests for other STDs.  How is this treated? Treatment for genital warts may include:  Applying prescription medicines to the warts. These may be solutions or creams.  Freezing the warts with liquid nitrogen (cryotherapy).  Burning the warts with: ? Laser treatment. ? An electrified probe (electrocautery).  Injecting a substance (Candida antigen or Trichophyton antigen) into the warts to help the body's immune system to fight off the warts.  Interferon injections.  Surgery to remove the warts.  Follow these instructions at home: Medicines  Apply over-the-counter and prescription medicines only as told by your health care provider.  Do not treat genital warts with medicines that are used for treating hand warts.  Talk with your health care provider about using over-the-counter anti-itch creams. General instructions  Do not touch or scratch the warts.  Do not have sex until your treatment has been completed.  Tell your current and past sexual partners about your condition because they may also need treatment.  Keep all follow-up visits as told by your health care provider. This is important.  After treatment, use condoms during sex to prevent future infections. Other Instructions for Women  Women who have genital warts might need increased screening for cervical cancer. This type of cancer is slow growing and can be cured if it is found early. Chances of developing cervical cancer are increased with HPV.  If you become pregnant, tell your health   care provider that you have had HPV. Your health care provider will monitor you closely during pregnancy to be sure that your baby is safe. How is this prevented? Talk with your health care provider about getting the HPV vaccines. These vaccines prevent some HPV infections and cancers. It is recommended that the vaccine be given to  males and females who are 9-26 years of age. It will not work if you already have HPV, and it is not recommended for pregnant women. Contact a health care provider if:  You have redness, swelling, or pain in the area of the treated skin.  You have a fever.  You feel generally ill.  You feel lumps in and around your genital area or anal area.  You have bleeding in your genital area or anal area.  You have pain during sexual intercourse. This information is not intended to replace advice given to you by your health care provider. Make sure you discuss any questions you have with your health care provider. Document Released: 08/10/2000 Document Revised: 01/19/2016 Document Reviewed: 11/08/2014 Elsevier Interactive Patient Education  2018 [employer]  

## 2017-06-20 ENCOUNTER — Other Ambulatory Visit: Payer: Self-pay | Admitting: Internal Medicine

## 2017-06-20 DIAGNOSIS — I10 Essential (primary) hypertension: Secondary | ICD-10-CM

## 2017-06-25 ENCOUNTER — Ambulatory Visit (INDEPENDENT_AMBULATORY_CARE_PROVIDER_SITE_OTHER): Payer: BLUE CROSS/BLUE SHIELD | Admitting: Family Medicine

## 2017-06-25 ENCOUNTER — Encounter: Payer: Self-pay | Admitting: Family Medicine

## 2017-06-25 ENCOUNTER — Ambulatory Visit: Payer: BLUE CROSS/BLUE SHIELD | Admitting: Family Medicine

## 2017-06-25 VITALS — BP 128/76 | HR 86 | Temp 98.3°F | Ht 67.0 in | Wt 207.0 lb

## 2017-06-25 DIAGNOSIS — A630 Anogenital (venereal) warts: Secondary | ICD-10-CM | POA: Diagnosis not present

## 2017-06-25 NOTE — Progress Notes (Signed)
Eugene Mccullough - 56 y.o. male MRN [id number]  Date of birth: 14-Dec-1960  SUBJECTIVE:  Including CC & ROS.  Chief Complaint  Patient presents with  . Rash    located on genitalia. Has been present since 06/20/17. He has been applying aldara which has not helped and he developed blisters.     Eugene Mccullough is a 56 y.o. male that is Presenting with a penile lesion. He reports penile lesion started about a month ago. He was diagnosed with a condyloma acuminata. He has been using the cream and reports improvement of his symptoms. He does feel that he has had a blister formation on the dorsal aspect of his glans penis. He has some uncomfortable sensation with urinating and pain when these areas touches underwear. Denies any fevers or chills.  He's been seen on 9/25 as well as 10/18 for similar complaints. He did not use the medication that was prescribed. The medication was prescribed again on 10/18 caused reaction.   Review of Systems  Constitutional: Negative for fever.  Genitourinary: Positive for genital sores. Negative for discharge, penile swelling, scrotal swelling and testicular pain.    HISTORY: Past Medical, Surgical, Social, and Family History Reviewed & Updated per EMR.   Pertinent Historical Findings include:  Past Medical History:  Diagnosis Date  . Gastric ulcer   . GERD (gastroesophageal reflux disease)   . Gout   . H. pylori infection   . Hyperlipidemia   . Hypertension   . IBS (irritable bowel syndrome)     Past Surgical History:  Procedure Laterality Date  . CHOLECYSTECTOMY  2005    Allergies  Allergen Reactions  . Hydrocodone-Acetaminophen Itching    Family History  Problem Relation Age of Onset  . Hyperlipidemia Father   . Crohn's disease Brother   . Cancer Neg Hx   . Early death Neg Hx   . Heart disease Neg Hx   . Hypertension Neg Hx   . Kidney disease Neg Hx   . Stroke Neg Hx   . Alcohol abuse Neg Hx   . Diabetes Neg Hx      Social History    Social History  . Marital status: Married    Spouse name: N/A  . Number of children: 3  . Years of education: N/A   Occupational History  . driver    Social History Main Topics  . Smoking status: Former Smoker    Packs/day: 1.00    Years: 20.00    Types: Cigarettes    Quit date: 09/27/2012  . Smokeless tobacco: Never Used  . Alcohol use No  . Drug use: No  . Sexual activity: Not Currently   Other Topics Concern  . Not on file   Social History Narrative  . No narrative on file     PHYSICAL EXAM:  VS: BP 128/76 (BP Location: Left Arm, Patient Position: Sitting, Cuff Size: Normal)   Pulse 86   Temp 98.3 F (36.8 C) (Oral)   Ht 5\' 7"  (1.702 m)   Wt 207 lb (93.9 kg)   SpO2 98%   BMI 32.42 kg/m  Physical Exam Gen: NAD, alert, cooperative with exam, well-appearing ENT: normal lips, normal nasal mucosa,  Eye: normal EOM, normal conjunctiva and lids CV:  no edema, +2 pedal pulses   Resp: no accessory muscle use, non-labored,  Skin: no rashes, no areas of induration  Neuro: normal tone, normal sensation to touch Psych:  normal insight, alert and oriented MSK: Normal gait, normal strength  GU: 3 areas located on the dorsal glans penis. No streaking. Appear to be healing blisters      ASSESSMENT & PLAN:   Condyloma acuminata This appears to be healing and does not show any infection. The areas of concern appear to be healing blisters. - Advised a barrier such as Vaseline and we will call her with a nonadhesive Telfa - Advised to stop the topical cream - If no improvement may need referral to urology.

## 2017-06-25 NOTE — Patient Instructions (Signed)
Thank you for coming in,   Please try applying Vaseline to the area.    Please feel free to call with any questions or concerns at any time, at [phone]. --Dr. Raeford Razor

## 2017-06-25 NOTE — Assessment & Plan Note (Signed)
This appears to be healing and does not show any infection. The areas of concern appear to be healing blisters. - Advised a barrier such as Vaseline and we will call her with a nonadhesive Telfa - Advised to stop the topical cream - If no improvement may need referral to urology.

## 2017-07-21 ENCOUNTER — Other Ambulatory Visit: Payer: Self-pay | Admitting: Internal Medicine

## 2017-07-21 DIAGNOSIS — I10 Essential (primary) hypertension: Secondary | ICD-10-CM

## 2017-08-12 ENCOUNTER — Encounter: Payer: Self-pay | Admitting: Internal Medicine

## 2017-08-12 ENCOUNTER — Ambulatory Visit: Payer: BLUE CROSS/BLUE SHIELD | Admitting: Internal Medicine

## 2017-08-12 VITALS — BP 114/74 | HR 82 | Temp 98.3°F | Ht 67.0 in | Wt 215.0 lb

## 2017-08-12 DIAGNOSIS — L03311 Cellulitis of abdominal wall: Secondary | ICD-10-CM | POA: Diagnosis not present

## 2017-08-12 DIAGNOSIS — M79605 Pain in left leg: Secondary | ICD-10-CM | POA: Diagnosis not present

## 2017-08-12 DIAGNOSIS — I10 Essential (primary) hypertension: Secondary | ICD-10-CM

## 2017-08-12 DIAGNOSIS — M79604 Pain in right leg: Secondary | ICD-10-CM | POA: Insufficient documentation

## 2017-08-12 MED ORDER — CYCLOBENZAPRINE HCL 5 MG PO TABS: 5.0000 mg | ORAL_TABLET | Freq: Three times a day (TID) | ORAL | 1 refills | Status: DC | PRN

## 2017-08-12 MED ORDER — DOXYCYCLINE HYCLATE 100 MG PO TABS: 100.0000 mg | ORAL_TABLET | Freq: Two times a day (BID) | ORAL | 0 refills | Status: DC

## 2017-08-12 NOTE — Assessment & Plan Note (Signed)
I suspect possible radicular pain but could be other MSK, ok for trial muscle relaxer, but to f/u with PCP if not improved

## 2017-08-12 NOTE — Assessment & Plan Note (Signed)
Mild to mod, for antibx course,  to f/u any worsening symptoms or concerns 

## 2017-08-12 NOTE — Assessment & Plan Note (Signed)
stable overall by history and exam, recent data reviewed with pt, and pt to continue medical treatment as before,  to f/u any worsening symptoms or concerns BP Readings from Last 3 Encounters:  08/12/17 114/74  06/25/17 128/76  06/13/17 100/68

## 2017-08-12 NOTE — Progress Notes (Signed)
Subjective:    Patient ID: Eugene Mccullough, male    DOB: 1960/10/12, 56 y.o.   MRN: [id number]  HPI  Here with c/o 3 days onset area just left of the upper abd midline of mild to mod redness, tender, swelling and somewhat raised, without fever or drainage or otherwise feeling ill.  No prior hx of MRSA or other recent cellulitis.  Nohting seems to make better or worse Pt continues to have recurring LBP with possible radicular pain to both legs with tingling, but no bowel or bladder change, fever, wt loss, worsening LE weakness, gait change or falls  He was asking only for muscle relaxer, hoping this would help. Pt denies chest pain, increased sob or doe, wheezing, orthopnea, PND, increased LE swelling, palpitations, dizziness or syncope.   Pt denies polydipsia, polyuria Past Medical History:  Diagnosis Date  . Gastric ulcer   . GERD (gastroesophageal reflux disease)   . Gout   . H. pylori infection   . Hyperlipidemia   . Hypertension   . IBS (irritable bowel syndrome)    Past Surgical History:  Procedure Laterality Date  . CHOLECYSTECTOMY  2005    reports that he quit smoking about 4 years ago. His smoking use included cigarettes. He has a 20.00 pack-year smoking history. he has never used smokeless tobacco. He reports that he does not drink alcohol or use drugs. family history includes Crohn's disease in his brother; Hyperlipidemia in his father. Allergies  Allergen Reactions  . Hydrocodone-Acetaminophen Itching   Current Outpatient Medications on File Prior to Visit  Medication Sig Dispense Refill  . amLODipine (NORVASC) 10 MG tablet TAKE 1 TABLET (10 MG TOTAL) BY MOUTH DAILY. 90 tablet 1  . atorvastatin (LIPITOR) 10 MG tablet Take 1 tablet (10 mg total) by mouth daily. 90 tablet 1  . Crisaborole (EUCRISA) 2 % OINT Apply 1 application topically 2 (two) times daily. 120 g 11  . finasteride (PROSCAR) 5 MG tablet Take 1 tablet (5 mg total) by mouth daily. 90 tablet 3  . hydrocortisone  2.5 % cream Apply topically 2 (two) times daily. Use for up to 14 days 28.35 g 1  . imiquimod (ALDARA) 5 % cream Apply topically 3 (three) times a week. 12 each 0  . lisinopril (PRINIVIL,ZESTRIL) 20 MG tablet TAKE 1 TABLET BY MOUTH TWICE A DAY 180 tablet 1  . metoprolol tartrate (LOPRESSOR) 50 MG tablet TAKE 1 TABLET BY MOUTH 2 TIMES DAILY 180 tablet 1  . omeprazole (PRILOSEC) 40 MG capsule Take 1 capsule (40 mg total) by mouth 2 (two) times daily. 180 capsule 1   No current facility-administered medications on file prior to visit.     Review of Systems   Constitutional: Negative for other unusual diaphoresis or sweats HENT: Negative for ear discharge or swelling Eyes: Negative for other worsening visual disturbances Respiratory: Negative for stridor or other swelling  Gastrointestinal: Negative for worsening distension or other blood Genitourinary: Negative for retention or other urinary change Musculoskeletal: Negative for other MSK pain or swelling Skin: Negative for color change or other new lesions Neurological: Negative for worsening tremors and other numbness  Psychiatric/Behavioral: Negative for worsening agitation or other fatigue All otherwise neg per pt    Objective:   Physical Exam BP 114/74   Pulse 82   Temp 98.3 F (36.8 C) (Oral)   Ht 5\' 7"  (1.702 m)   Wt 215 lb (97.5 kg)   SpO2 100%   BMI 33.67 kg/m  VS noted,  Constitutional: Pt appears in NAD HENT: Head: NCAT.  Right Ear: External ear normal.  Left Ear: External ear normal.  Eyes: . Pupils are equal, round, and reactive to light. Conjunctivae and EOM are normal Nose: without d/c or deformity Neck: Neck supple. Gross normal ROM Cardiovascular: Normal rate and regular rhythm.   Pulmonary/Chest: Effort normal and breath sounds without rales or wheezing.  Abd:  Soft, NT, ND, + BS, no organomegaly but does have 1.5 cm area red, tender, warm swelling without fluctuance or drainage just left of the mid upper  epigastric area Spine nontender in the midline, mild right lumbar paravertebral tender Neurological: Pt is alert. At baseline orientation, motor grossly intact Skin: Skin is warm. No rashes, other new lesions, no LE edema Psychiatric: Pt behavior is normal without agitation  No other exam findings Lab Results  Component Value Date   WBC 5.9 02/25/2017   HGB 13.2 02/25/2017   HCT 40.3 02/25/2017   PLT 170.0 02/25/2017   GLUCOSE 84 02/25/2017   CHOL 183 02/25/2017   TRIG 179.0 (H) 02/25/2017   HDL 38.60 (L) 02/25/2017   LDLDIRECT 136.0 05/09/2016   LDLCALC 109 (H) 02/25/2017   ALT 43 02/25/2017   AST 30 02/25/2017   NA 138 02/25/2017   K 3.9 02/25/2017   CL 101 02/25/2017   CREATININE 0.94 02/25/2017   BUN 14 02/25/2017   CO2 29 02/25/2017   TSH 0.42 02/25/2017   PSA 0.48 05/09/2016   HGBA1C 5.9 02/25/2017       Assessment & Plan:

## 2017-08-12 NOTE — Patient Instructions (Signed)
Please take all new medication as prescribed - the antibiotic, and the muscle relaxer if needed  Please see Dr Ronnald Ramp to consider an MRI for the lower back if not improved  Please continue all other medications as before, and refills have been done if requested.  Please have the pharmacy call with any other refills you may need.  Please keep your appointments with your specialists as you may have planned

## 2017-08-27 HISTORY — PX: UPPER GI ENDOSCOPY: SHX6162

## 2017-09-04 ENCOUNTER — Other Ambulatory Visit: Payer: Self-pay | Admitting: Internal Medicine

## 2017-09-12 ENCOUNTER — Ambulatory Visit (INDEPENDENT_AMBULATORY_CARE_PROVIDER_SITE_OTHER)
Admission: RE | Admit: 2017-09-12 | Discharge: 2017-09-12 | Disposition: A | Discharge status: Home / Self Care | Payer: BLUE CROSS/BLUE SHIELD | Source: Ambulatory Visit | Attending: Family Medicine | Admitting: Family Medicine

## 2017-09-12 ENCOUNTER — Encounter: Payer: Self-pay | Admitting: Family Medicine

## 2017-09-12 ENCOUNTER — Ambulatory Visit: Payer: BLUE CROSS/BLUE SHIELD | Admitting: Family Medicine

## 2017-09-12 ENCOUNTER — Telehealth: Payer: Self-pay | Admitting: Family Medicine

## 2017-09-12 VITALS — BP 120/78 | HR 64 | Temp 98.3°F | Wt 219.0 lb

## 2017-09-12 DIAGNOSIS — M5416 Radiculopathy, lumbar region: Secondary | ICD-10-CM

## 2017-09-12 MED ORDER — MELOXICAM 15 MG PO TABS: 15.0000 mg | ORAL_TABLET | Freq: Every day | ORAL | 0 refills | Status: DC

## 2017-09-12 NOTE — Telephone Encounter (Signed)
Left VM for patient. If he calls back please have him speak with a nurse/CMA and inform that his xrays show mild curving of his spine which is likely not the reason of his pain. Will go ahead with the plan   If any questions then please take the best time and phone number to call and I will try to call him back.   Rosemarie Ax, MD [facility] 09/12/2017, 2:59 PM

## 2017-09-12 NOTE — Progress Notes (Signed)
Eugene Mccullough - 57 y.o. male MRN [id number]  Date of birth: [birth year]  SUBJECTIVE:  Including CC & ROS.  Chief Complaint  Patient presents with  . Pain    sciatica pain---occurred about 5 years ago, treated with cortisone shot and exercises, was feeling fine until recently---pain has returned    Eugene Mccullough is a 57 y.o. male that is presenting with lumbar radiculopathy down the right side. The pain is acute on chronic. 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 He has been evaluated 5 years ago for the same problem. He had epidurals and physical therapy. He felt that he did not have any flares in between this until recently. This time he feels the pain is significant nature and radiates down his right leg. Does not have any pain in his foot. Has some numbness in his leg as well. Denies any urinary incontinence or saddle anesthesia. Feels the pain is moderate in nature.   Review of Systems  Constitutional: Negative for fever.  HENT: Negative for rhinorrhea.   Respiratory: Negative for cough.   Cardiovascular: Negative for chest pain.  Gastrointestinal: Negative for abdominal pain.  Musculoskeletal: Positive for back pain.  Skin: Negative for color change.  Neurological: Negative for weakness.  Hematological: Negative for adenopathy.  Psychiatric/Behavioral: Negative for agitation.    HISTORY: Past Medical, Surgical, Social, and Family History Reviewed & Updated per EMR.   Pertinent Historical Findings include:  Past Medical History:  Diagnosis Date  . Gastric ulcer   . GERD (gastroesophageal reflux disease)   . Gout   . H. pylori infection   . Hyperlipidemia   . Hypertension   . IBS (irritable bowel syndrome)     Past Surgical History:  Procedure Laterality Date  . CHOLECYSTECTOMY  2005    Allergies  Allergen Reactions  . Hydrocodone-Acetaminophen Itching    Family History  Problem Relation  Age of Onset  . Hyperlipidemia Father   . Crohn's disease Brother   . Cancer Neg Hx   . Early death Neg Hx   . Heart disease Neg Hx   . Hypertension Neg Hx   . Kidney disease Neg Hx   . Stroke Neg Hx   . Alcohol abuse Neg Hx   . Diabetes Neg Hx      Social History   Socioeconomic History  . Marital status: Married    Spouse name: Not on file  . Number of children: 3  . Years of education: Not on file  . Highest education level: Not on file  Social Needs  . Financial resource strain: Not on file  . Food insecurity - worry: Not on file  . Food insecurity - inability: Not on file  . Transportation needs - medical: Not on file  . Transportation needs - non-medical: Not on file  Occupational History  . Occupation: driver  Tobacco Use  . Smoking status: Former Smoker    Packs/day: 1.00    Years: 20.00    Pack years: 20.00    Types: Cigarettes    Last attempt to quit: 09/27/2012    Years since quitting: 4.9  . Smokeless tobacco: Never Used  Substance and Sexual Activity  . Alcohol use: No    Alcohol/week: 0.0 oz  . Drug use: No  . Sexual activity: Not Currently  Other Topics Concern  . Not on file  Social History Narrative  . Not on file  PHYSICAL EXAM:  VS: BP 120/78   Pulse 64   Temp 98.3 F (36.8 C)   Wt 219 lb (99.3 kg)   SpO2 100%   BMI 34.30 kg/m  Physical Exam Gen: NAD, alert, cooperative with exam, well-appearing ENT: normal lips, normal nasal mucosa,  Eye: normal EOM, normal conjunctiva and lids CV:  no edema, +2 pedal pulses   Resp: no accessory muscle use, non-labored,  Skin: no rashes, no areas of induration  Neuro: normal tone, normal sensation to touch Psych:  normal insight, alert and oriented MSK:  Back Exam:  Inspection: Unremarkable  Palpable tenderness: None. Range of Motion:  Flexion 45 deg; Extension 45 deg; Side Bending to 45 deg bilaterally; Rotation to 45 deg bilaterally  Leg strength: Quad: 5/5 Hamstring: 5/5 Hip flexor: 5/5  Hip abductors: 5/5  Strength at foot: Plantar-flexion: 5/5 Dorsi-flexion: 5/5 Eversion: 5/5 Inversion: 5/5  Sensory change: Gross sensation intact to all lumbar and sacral dermatomes.  Reflexes: 2+ at both patellar tendons, 2+ at achilles tendons, Babinski's downgoing.  Gait unremarkable. SLR laying: positive   XSLR laying: Negative  FABER: negative.       ASSESSMENT & PLAN:   Lumbar radiculopathy Acute and chronic in nature. Feels this pain as gotten worse over the past 6 months. Having some numbness and worsening pain. 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 - Mobic - Lumbar x-ray - MRI lumbar spine without contrast. Evaluating for nerve impingement

## 2017-09-12 NOTE — Patient Instructions (Signed)
We will call you back with the results from today.  Please try the exercises  Please let us know if you don't hear about the MRi.

## 2017-09-12 NOTE — Assessment & Plan Note (Signed)
Acute and chronic in nature. Feels this pain as gotten worse over the past 6 months. Having some numbness and worsening pain. 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 - Mobic - Lumbar x-ray - MRI lumbar spine without contrast. Evaluating for nerve impingement

## 2017-09-19 ENCOUNTER — Other Ambulatory Visit: Payer: Self-pay | Admitting: Internal Medicine

## 2017-09-22 ENCOUNTER — Ambulatory Visit
Admission: RE | Admit: 2017-09-22 | Discharge: 2017-09-22 | Disposition: A | Discharge status: Home / Self Care | Payer: BLUE CROSS/BLUE SHIELD | Source: Ambulatory Visit | Attending: Family Medicine | Admitting: Family Medicine

## 2017-09-22 DIAGNOSIS — M5416 Radiculopathy, lumbar region: Secondary | ICD-10-CM

## 2017-09-24 ENCOUNTER — Telehealth: Payer: Self-pay | Admitting: Family Medicine

## 2017-09-24 NOTE — Telephone Encounter (Signed)
Left VM for patient. If he calls back please have him speak with a nurse/CMA and inform that his MRI shows improvement of his herniation that was previously seen on MRI. Could try epidurals or referral to neurosurgery.   If any questions then please take the best time and phone number to call and I will try to call him back.   Rosemarie Ax, MD [facility] 09/24/2017, 12:58 PM

## 2017-10-15 ENCOUNTER — Other Ambulatory Visit: Payer: Self-pay | Admitting: Family Medicine

## 2017-10-15 DIAGNOSIS — M5416 Radiculopathy, lumbar region: Secondary | ICD-10-CM

## 2017-11-05 ENCOUNTER — Other Ambulatory Visit: Payer: Self-pay | Admitting: Internal Medicine

## 2017-11-10 ENCOUNTER — Other Ambulatory Visit: Payer: Self-pay | Admitting: Internal Medicine

## 2017-11-10 ENCOUNTER — Other Ambulatory Visit: Payer: Self-pay | Admitting: Family Medicine

## 2017-11-10 DIAGNOSIS — M5416 Radiculopathy, lumbar region: Secondary | ICD-10-CM

## 2017-11-12 ENCOUNTER — Ambulatory Visit: Payer: Self-pay

## 2017-11-12 ENCOUNTER — Other Ambulatory Visit (INDEPENDENT_AMBULATORY_CARE_PROVIDER_SITE_OTHER): Payer: BLUE CROSS/BLUE SHIELD

## 2017-11-12 ENCOUNTER — Ambulatory Visit: Payer: BLUE CROSS/BLUE SHIELD | Admitting: Family Medicine

## 2017-11-12 ENCOUNTER — Encounter: Payer: Self-pay | Admitting: Family Medicine

## 2017-11-12 VITALS — BP 112/68 | HR 76 | Temp 98.6°F | Ht 67.0 in | Wt 216.0 lb

## 2017-11-12 DIAGNOSIS — R5383 Other fatigue: Secondary | ICD-10-CM

## 2017-11-12 DIAGNOSIS — H109 Unspecified conjunctivitis: Secondary | ICD-10-CM

## 2017-11-12 LAB — CBC WITH DIFFERENTIAL/PLATELET
BASOS ABS: 0.1 10*3/uL (ref 0.0–0.1)
Basophils Relative: 1.2 % (ref 0.0–3.0)
EOS PCT: 2.5 % (ref 0.0–5.0)
Eosinophils Absolute: 0.2 10*3/uL (ref 0.0–0.7)
HCT: 39.1 % (ref 39.0–52.0)
HEMOGLOBIN: 12.7 g/dL — AB (ref 13.0–17.0)
Lymphocytes Relative: 33.4 % (ref 12.0–46.0)
Lymphs Abs: 2.2 10*3/uL (ref 0.7–4.0)
MCHC: 32.5 g/dL (ref 30.0–36.0)
MCV: 75.9 fl — ABNORMAL LOW (ref 78.0–100.0)
MONO ABS: 0.6 10*3/uL (ref 0.1–1.0)
Monocytes Relative: 9.8 % (ref 3.0–12.0)
Neutro Abs: 3.5 10*3/uL (ref 1.4–7.7)
Neutrophils Relative %: 53.1 % (ref 43.0–77.0)
Platelets: 159 10*3/uL (ref 150.0–400.0)
RBC: 5.15 Mil/uL (ref 4.22–5.81)
RDW: 17.8 % — ABNORMAL HIGH (ref 11.5–15.5)
WBC: 6.6 10*3/uL (ref 4.0–10.5)

## 2017-11-12 LAB — COMPREHENSIVE METABOLIC PANEL
ALBUMIN: 4 g/dL (ref 3.5–5.2)
ALK PHOS: 82 U/L (ref 39–117)
ALT: 23 U/L (ref 0–53)
AST: 17 U/L (ref 0–37)
BUN: 12 mg/dL (ref 6–23)
CO2: 28 mEq/L (ref 19–32)
Calcium: 9.8 mg/dL (ref 8.4–10.5)
Chloride: 101 mEq/L (ref 96–112)
Creatinine, Ser: 1.11 mg/dL (ref 0.40–1.50)
GFR: 87.77 mL/min (ref >60.00)
Glucose, Bld: 150 mg/dL — ABNORMAL HIGH (ref 70–99)
POTASSIUM: 4.3 meq/L (ref 3.5–5.1)
SODIUM: 136 meq/L (ref 135–145)
TOTAL PROTEIN: 7.8 g/dL (ref 6.0–8.3)
Total Bilirubin: 0.4 mg/dL (ref 0.2–1.2)

## 2017-11-12 LAB — TSH: TSH: 0.89 u[IU]/mL (ref 0.35–4.50)

## 2017-11-12 MED ORDER — ERYTHROMYCIN 5 MG/GM OP OINT: 1.0000 "application " | TOPICAL_OINTMENT | Freq: Three times a day (TID) | OPHTHALMIC | 0 refills | Status: AC

## 2017-11-12 NOTE — Progress Notes (Signed)
Eugene Mccullough - 57 y.o. male MRN [id number]  Date of birth: April 02, 1961  SUBJECTIVE:  Including CC & ROS.  Chief Complaint  Patient presents with  . Left eye pain    Eugene Mccullough is a 57 y.o. male that is presenting with left eye pain. He noticed the pain yesterday morning. Admits to redness and draining. Denies injury to his eyes. Admits to light sensitivity.  He has been using clear eyes with no improvement. Denies any vision changes. Has not been around anyone with similar symptoms.  Fatigue has been ongoing for three weeks. He feels tired all the time. When he wakes up in the morning he feels like he hasn't rested. He feels sleepy when driving. Denies recent medication changes.he has a history of obstructive sleep apnea but does not use his sleep. He reports being intolerant to using the mask. He has been diagnosed with this for 3 years. His symptoms started about 3 weeks ago.    Review of Systems  Constitutional: Positive for activity change.  HENT: Negative for congestion.   Eyes: Positive for redness.  Respiratory: Negative for cough.   Cardiovascular: Negative for chest pain.  Gastrointestinal: Negative for abdominal pain.  Musculoskeletal: Negative for back pain.  Skin: Negative for color change.  Neurological: Negative for weakness.  Hematological: Negative for adenopathy.  Psychiatric/Behavioral: Negative for agitation.    HISTORY: Past Medical, Surgical, Social, and Family History Reviewed & Updated per EMR.   Pertinent Historical Findings include:  Past Medical History:  Diagnosis Date  . Gastric ulcer   . GERD (gastroesophageal reflux disease)   . Gout   . H. pylori infection   . Hyperlipidemia   . Hypertension   . IBS (irritable bowel syndrome)     Past Surgical History:  Procedure Laterality Date  . CHOLECYSTECTOMY  2005    Allergies  Allergen Reactions  . Hydrocodone-Acetaminophen Itching    Family History  Problem Relation Age of Onset  .  Hyperlipidemia Father   . Crohn's disease Brother   . Cancer Neg Hx   . Early death Neg Hx   . Heart disease Neg Hx   . Hypertension Neg Hx   . Kidney disease Neg Hx   . Stroke Neg Hx   . Alcohol abuse Neg Hx   . Diabetes Neg Hx      Social History   Socioeconomic History  . Marital status: Married    Spouse name: Not on file  . Number of children: 3  . Years of education: Not on file  . Highest education level: Not on file  Social Needs  . Financial resource strain: Not on file  . Food insecurity - worry: Not on file  . Food insecurity - inability: Not on file  . Transportation needs - medical: Not on file  . Transportation needs - non-medical: Not on file  Occupational History  . Occupation: driver  Tobacco Use  . Smoking status: Former Smoker    Packs/day: 1.00    Years: 20.00    Pack years: 20.00    Types: Cigarettes    Last attempt to quit: 09/27/2012    Years since quitting: 5.1  . Smokeless tobacco: Never Used  Substance and Sexual Activity  . Alcohol use: No    Alcohol/week: 0.0 oz  . Drug use: No  . Sexual activity: Not Currently  Other Topics Concern  . Not on file  Social History Narrative  . Not on file     PHYSICAL EXAM:  VS: BP 112/68 (BP Location: Left Arm, Patient Position: Sitting, Cuff Size: Normal)   Pulse 76   Temp 98.6 F (37 C) (Oral)   Ht 5\' 7"  (1.702 m)   Wt 216 lb (98 kg)   SpO2 98%   BMI 33.83 kg/m  Physical Exam Gen: NAD, alert, cooperative with exam,  ENT: normal lips, normal nasal mucosa,  Eye: normal EOM, normal conjunctiva and lid on the right, injected conjunctiva on the left, no debris appreciated, no abrasion appreciated CV:  no edema, +2 pedal pulses   Resp: no accessory muscle use, non-labored,  Skin: no rashes, no areas of induration  Neuro: normal tone, normal sensation to touch Psych:  normal insight, alert and oriented MSK: normal gait, normal strength      ASSESSMENT & PLAN:   Other fatigue  Possibly  related to his sleep apnea. He is not compliant with a CPAP. - Counseled on the use of this CPAP - CBC, CMP, TSH   Conjunctivitis of left eye Likely viral but no exposure. - erythromycin ophthalmic ointment - If no improvement may need a referral to ophthalmology

## 2017-11-12 NOTE — Telephone Encounter (Signed)
Pt. Left eye started hurting yesterday and has gotten progressively worse. Does not remember getting anything in the eye. Eye is red and painful - light hurts his left eye as well. Appointment for today. Reason for Disposition . Eye pain present > 24 hours  Answer Assessment - Initial Assessment Questions 1. ONSET: "When did the pain start?" (e.g., minutes, hours, days)     Yesterday 2. TIMING: "Does the pain come and go, or has it been constant since it started?" (e.g., constant, intermittent, fleeting)     Getting worse 3. SEVERITY: "How bad is the pain?"   (Scale 1-10; mild, moderate or severe)   - MILD (1-3): doesn't interfere with normal activities    - MODERATE (4-7): interferes with normal activities or awakens from sleep    - SEVERE (8-10): excruciating pain and patient unable to do normal activities     8-9 4. LOCATION: "Where does it hurt?"  (e.g., eyelid, eye, cheekbone)     Whole eye 5. CAUSE: "What do you think is causing the pain?"     unsure 6. VISION: "Do you have blurred vision or changes in your vision?"      The light hurts it 7. EYE DISCHARGE: "Is there any discharge (pus) from the eye(s)?"  If yes, ask: "What color is it?"      Watering 8. FEVER: "Do you have a fever?" If so, ask: "What is it, how was it measured, and when did it start?"      No 9. OTHER SYMPTOMS: "Do you have any other symptoms?" (e.g., headache, nasal discharge, facial rash)     No 10. PREGNANCY: "Is there any chance you are pregnant?" "When was your last menstrual period?"       No  Protocols used: EYE PAIN-A-AH

## 2017-11-12 NOTE — Telephone Encounter (Signed)
Pt has appt with Raeford Razor today

## 2017-11-12 NOTE — Patient Instructions (Signed)
We will call you with the results from today  Please let me know if your eye doesn't feel better in a day or two.

## 2017-11-13 ENCOUNTER — Encounter: Payer: Self-pay | Admitting: Internal Medicine

## 2017-11-13 DIAGNOSIS — R5383 Other fatigue: Secondary | ICD-10-CM | POA: Insufficient documentation

## 2017-11-13 DIAGNOSIS — H109 Unspecified conjunctivitis: Secondary | ICD-10-CM | POA: Insufficient documentation

## 2017-11-13 NOTE — Assessment & Plan Note (Signed)
Possibly related to his sleep apnea. He is not compliant with a CPAP. - Counseled on the use of this CPAP - CBC, CMP, TSH

## 2017-11-13 NOTE — Assessment & Plan Note (Signed)
Likely viral but no exposure. - erythromycin ophthalmic ointment - If no improvement may need a referral to ophthalmology

## 2017-11-28 ENCOUNTER — Other Ambulatory Visit (INDEPENDENT_AMBULATORY_CARE_PROVIDER_SITE_OTHER): Payer: BLUE CROSS/BLUE SHIELD

## 2017-11-28 ENCOUNTER — Encounter: Payer: Self-pay | Admitting: Internal Medicine

## 2017-11-28 ENCOUNTER — Ambulatory Visit: Payer: BLUE CROSS/BLUE SHIELD | Admitting: Internal Medicine

## 2017-11-28 ENCOUNTER — Other Ambulatory Visit: Payer: Self-pay | Admitting: Internal Medicine

## 2017-11-28 VITALS — BP 110/78 | HR 77 | Temp 99.0°F | Resp 16 | Ht 67.0 in | Wt 215.0 lb

## 2017-11-28 DIAGNOSIS — I10 Essential (primary) hypertension: Secondary | ICD-10-CM

## 2017-11-28 DIAGNOSIS — D539 Nutritional anemia, unspecified: Secondary | ICD-10-CM | POA: Diagnosis not present

## 2017-11-28 DIAGNOSIS — A630 Anogenital (venereal) warts: Secondary | ICD-10-CM | POA: Diagnosis not present

## 2017-11-28 DIAGNOSIS — L853 Xerosis cutis: Secondary | ICD-10-CM | POA: Insufficient documentation

## 2017-11-28 LAB — BASIC METABOLIC PANEL
BUN: 16 mg/dL (ref 6–23)
CHLORIDE: 104 meq/L (ref 96–112)
CO2: 28 mEq/L (ref 19–32)
Calcium: 9.8 mg/dL (ref 8.4–10.5)
Creatinine, Ser: 1.18 mg/dL (ref 0.40–1.50)
GFR: 81.77 mL/min (ref >60.00)
GLUCOSE: 110 mg/dL — AB (ref 70–99)
Potassium: 4.5 mEq/L (ref 3.5–5.1)
SODIUM: 137 meq/L (ref 135–145)

## 2017-11-28 LAB — CBC WITH DIFFERENTIAL/PLATELET
BASOS PCT: 1.4 % (ref 0.0–3.0)
Basophils Absolute: 0.1 10*3/uL (ref 0.0–0.1)
Eosinophils Absolute: 0.2 10*3/uL (ref 0.0–0.7)
Eosinophils Relative: 3.5 % (ref 0.0–5.0)
HCT: 38.6 % — ABNORMAL LOW (ref 39.0–52.0)
HEMOGLOBIN: 12.6 g/dL — AB (ref 13.0–17.0)
LYMPHS ABS: 2.4 10*3/uL (ref 0.7–4.0)
Lymphocytes Relative: 40.1 % (ref 12.0–46.0)
MCHC: 32.7 g/dL (ref 30.0–36.0)
MCV: 76.1 fl — AB (ref 78.0–100.0)
MONOS PCT: 11.5 % (ref 3.0–12.0)
Monocytes Absolute: 0.7 10*3/uL (ref 0.1–1.0)
Neutro Abs: 2.6 10*3/uL (ref 1.4–7.7)
Neutrophils Relative %: 43.5 % (ref 43.0–77.0)
Platelets: 150 10*3/uL (ref 150.0–400.0)
RBC: 5.08 Mil/uL (ref 4.22–5.81)
RDW: 18 % — AB (ref 11.5–15.5)
WBC: 6 10*3/uL (ref 4.0–10.5)

## 2017-11-28 NOTE — Patient Instructions (Signed)

## 2017-11-28 NOTE — Progress Notes (Signed)
Subjective:  Patient ID: Eugene Mccullough, male    DOB: [birth year]  Age: 57 y.o. MRN: [id number]  CC: Anemia and Hypertension   HPI Eugene Mccullough presents for f/up - He recently saw another provider for musculoskeletal pain and was prescribed an anti-inflammatory and muscle relaxer.  He returns for follow-up and tells me his musculoskeletal pain has resolved.  He has penile warts that have not responded to Aldara and he wants to see a urologist.  He has dry flaky skin and he has tried over-the-counter topicals and wants to see a dermatologist.  He tells me his blood pressure has been very well controlled.  He denies any recent episodes of headache, dizziness, lightheadedness, shortness of breath, edema, or fatigue.  Outpatient Medications Prior to Visit  Medication Sig Dispense Refill  . atorvastatin (LIPITOR) 10 MG tablet Take 1 tablet (10 mg total) by mouth daily. 90 tablet 1  . cyclobenzaprine (FLEXERIL) 5 MG tablet TAKE 1 TABLET BY MOUTH THREE TIMES A DAY AS NEEDED FOR MUSCLE SPASMS 60 tablet 1  . finasteride (PROSCAR) 5 MG tablet Take 1 tablet (5 mg total) by mouth daily. 90 tablet 3  . metoprolol tartrate (LOPRESSOR) 50 MG tablet TAKE 1 TABLET BY MOUTH 2 TIMES DAILY 180 tablet 1  . omeprazole (PRILOSEC) 40 MG capsule TAKE 1 CAPSULE BY MOUTH TWICE A DAY 180 capsule 1  . amLODipine (NORVASC) 10 MG tablet TAKE 1 TABLET (10 MG TOTAL) BY MOUTH DAILY. 90 tablet 1  . Crisaborole (EUCRISA) 2 % OINT Apply 1 application topically 2 (two) times daily. 120 g 11  . hydrocortisone 2.5 % cream Apply topically 2 (two) times daily. Use for up to 14 days 28.35 g 1  . imiquimod (ALDARA) 5 % cream Apply topically 3 (three) times a week. 12 each 0  . lisinopril (PRINIVIL,ZESTRIL) 20 MG tablet TAKE 1 TABLET BY MOUTH TWICE A DAY 180 tablet 0  . meloxicam (MOBIC) 15 MG tablet Take 1 tablet (15 mg total) by mouth daily. 30 tablet 0   No facility-administered medications prior to visit.     ROS Review of  Systems  Constitutional: Negative.  Negative for appetite change, diaphoresis, fatigue and unexpected weight change.  HENT: Negative.   Eyes: Negative.   Respiratory: Negative for cough, chest tightness, shortness of breath and wheezing.   Cardiovascular: Negative for chest pain, palpitations and leg swelling.  Gastrointestinal: Negative.  Negative for abdominal pain, diarrhea, nausea and vomiting.  Endocrine: Negative.   Genitourinary: Negative.  Negative for decreased urine volume, difficulty urinating, dysuria and urgency.  Musculoskeletal: Negative.  Negative for arthralgias, back pain, myalgias and neck pain.  Skin: Negative.  Negative for color change and pallor.  Allergic/Immunologic: Negative.   Neurological: Negative.  Negative for weakness, light-headedness and headaches.  Hematological: Negative for adenopathy. Does not bruise/bleed easily.  Psychiatric/Behavioral: Negative.     Objective:  BP 110/78 (BP Location: Left Arm, Patient Position: Sitting, Cuff Size: Large)   Pulse 77   Temp 99 F (37.2 C) (Oral)   Resp 16   Ht 5\' 7"  (1.702 m)   Wt 215 lb (97.5 kg)   SpO2 98%   BMI 33.67 kg/m   BP Readings from Last 3 Encounters:  11/28/17 110/78  11/12/17 112/68  09/12/17 120/78    Wt Readings from Last 3 Encounters:  11/28/17 215 lb (97.5 kg)  11/12/17 216 lb (98 kg)  09/12/17 219 lb (99.3 kg)    Physical Exam  Constitutional: He is oriented  to person, place, and time. No distress.  HENT:  Mouth/Throat: Oropharynx is clear and moist. No oropharyngeal exudate.  Eyes: Conjunctivae are normal. Left eye exhibits no discharge. No scleral icterus.  Neck: Normal range of motion. Neck supple. No JVD present. No thyromegaly present.  Cardiovascular: Normal rate, regular rhythm and normal heart sounds. Exam reveals no gallop and no friction rub.  No murmur heard. Pulmonary/Chest: Effort normal and breath sounds normal. No respiratory distress. He has no wheezes. He has  no rales.  Abdominal: Soft. Bowel sounds are normal. He exhibits no distension and no mass. There is no tenderness. There is no guarding.  Musculoskeletal: Normal range of motion. He exhibits no edema, tenderness or deformity.  Lymphadenopathy:    He has no cervical adenopathy.  Neurological: He is alert and oriented to person, place, and time.  Skin: Skin is warm and dry. No rash noted. He is not diaphoretic. No erythema. No pallor.  ++ xerosis  Vitals reviewed.   Lab Results  Component Value Date   WBC 6.0 11/28/2017   HGB 12.6 (L) 11/28/2017   HCT 38.6 (L) 11/28/2017   PLT 150.0 11/28/2017   GLUCOSE 110 (H) 11/28/2017   CHOL 183 02/25/2017   TRIG 179.0 (H) 02/25/2017   HDL 38.60 (L) 02/25/2017   LDLDIRECT 136.0 05/09/2016   LDLCALC 109 (H) 02/25/2017   ALT 23 11/12/2017   AST 17 11/12/2017   NA 137 11/28/2017   K 4.5 11/28/2017   CL 104 11/28/2017   CREATININE 1.18 11/28/2017   BUN 16 11/28/2017   CO2 28 11/28/2017   TSH 0.89 11/12/2017   PSA 0.48 05/09/2016   HGBA1C 5.9 02/25/2017    Mr Lumbar Spine Wo Contrast  Result Date: 09/22/2017 CLINICAL DATA:  Low back pain with right leg numbness for 6 months. EXAM: MRI LUMBAR SPINE WITHOUT CONTRAST TECHNIQUE: Multiplanar, multisequence MR imaging of the lumbar spine was performed. No intravenous contrast was administered. COMPARISON:  12/24/2012 FINDINGS: Segmentation:  12/24/2012 Alignment:  Normal Vertebrae:  No fracture, evidence of discitis, or bone lesion. Conus medullaris and cauda equina: Conus extends to the L1-2 level. Conus and cauda equina appear normal. Paraspinal and other soft tissues: Left renal cystic intensity. Disc levels: T12- L1: Unremarkable. L1-L2: Unremarkable. L2-L3: Unremarkable. L3-L4: Unremarkable. L4-L5: Mild disc narrowing with left foraminal protrusion that is shallow. Stable mild left foraminal stenosis. Patent spinal canal L5-S1:Significant regression of a right paracentral disc extrusion. A small  hypointense protrusion is still seen right paracentral, impinging on the right S1 nerve root. Disc narrowing and desiccation which may have progressed. The foramina are crowded without compression. IMPRESSION: 1. L5-S1 regression of right paracentral disc extrusion compared to 2014. There is still a small right paracentral protrusion compressing the right S1 nerve root. No other explanation for right leg symptoms. 2. L4-5 left foraminal protrusion with mild foraminal stenosis, chronic. Electronically Signed   By: Monte Fantasia M.D.   On: 09/22/2017 18:15    Assessment & Plan:   Yoshito was seen today for anemia and hypertension.  Diagnoses and all orders for this visit:  Essential hypertension- His blood pressure is over controlled so I have asked him to stop taking amlodipine and lisinopril.  He will stay on metoprolol at the current dose. -     CBC with Differential/Platelet; Future -     Basic metabolic panel; Future  Condyloma acuminata -     Ambulatory referral to Urology  Xerosis of skin -     Ambulatory  referral to Dermatology  Deficiency anemia- He has a history of peptic ulcer disease so I have asked him to stop taking the anti-inflammatory.  Will continue omeprazole.  I have asked him to return for me to screen for blood in his stool and to check for vitamin deficiencies. -     CBC with Differential/Platelet; Future -     Vitamin B12; Future -     IBC panel; Future -     Ferritin; Future -     Folate; Future -     Vitamin B1; Future   I have discontinued Eugene Mccullough's hydrocortisone, Crisaborole, imiquimod, amLODipine, lisinopril, and meloxicam. I am also having him maintain his finasteride, atorvastatin, metoprolol tartrate, cyclobenzaprine, and omeprazole.  No orders of the defined types were placed in this encounter.    Follow-up: Return in about 3 months (around 02/27/2018).  Scarlette Calico, MD

## 2017-11-29 ENCOUNTER — Encounter: Payer: Self-pay | Admitting: Internal Medicine

## 2017-11-29 DIAGNOSIS — D649 Anemia, unspecified: Secondary | ICD-10-CM | POA: Insufficient documentation

## 2017-12-05 ENCOUNTER — Encounter: Payer: Self-pay | Admitting: Internal Medicine

## 2017-12-05 ENCOUNTER — Other Ambulatory Visit (INDEPENDENT_AMBULATORY_CARE_PROVIDER_SITE_OTHER): Payer: BLUE CROSS/BLUE SHIELD

## 2017-12-05 ENCOUNTER — Ambulatory Visit: Payer: BLUE CROSS/BLUE SHIELD | Admitting: Internal Medicine

## 2017-12-05 VITALS — BP 114/72 | HR 85 | Temp 98.6°F | Resp 16 | Ht 67.0 in | Wt 215.0 lb

## 2017-12-05 DIAGNOSIS — D508 Other iron deficiency anemias: Secondary | ICD-10-CM | POA: Diagnosis not present

## 2017-12-05 DIAGNOSIS — D539 Nutritional anemia, unspecified: Secondary | ICD-10-CM

## 2017-12-05 DIAGNOSIS — K279 Peptic ulcer, site unspecified, unspecified as acute or chronic, without hemorrhage or perforation: Secondary | ICD-10-CM | POA: Diagnosis not present

## 2017-12-05 LAB — CBC WITH DIFFERENTIAL/PLATELET
BASOS ABS: 0 10*3/uL (ref 0.0–0.1)
Basophils Relative: 0.8 % (ref 0.0–3.0)
EOS PCT: 2.6 % (ref 0.0–5.0)
Eosinophils Absolute: 0.1 10*3/uL (ref 0.0–0.7)
HEMATOCRIT: 38.5 % — AB (ref 39.0–52.0)
Hemoglobin: 12.7 g/dL — ABNORMAL LOW (ref 13.0–17.0)
LYMPHS ABS: 1.9 10*3/uL (ref 0.7–4.0)
LYMPHS PCT: 34 % (ref 12.0–46.0)
MCHC: 33.1 g/dL (ref 30.0–36.0)
MCV: 75.8 fl — AB (ref 78.0–100.0)
MONOS PCT: 9.1 % (ref 3.0–12.0)
Monocytes Absolute: 0.5 10*3/uL (ref 0.1–1.0)
NEUTROS ABS: 2.9 10*3/uL (ref 1.4–7.7)
NEUTROS PCT: 53.5 % (ref 43.0–77.0)
Platelets: 143 10*3/uL — ABNORMAL LOW (ref 150.0–400.0)
RBC: 5.07 Mil/uL (ref 4.22–5.81)
RDW: 17.6 % — ABNORMAL HIGH (ref 11.5–15.5)
WBC: 5.5 10*3/uL (ref 4.0–10.5)

## 2017-12-05 LAB — FOLATE

## 2017-12-05 LAB — IBC PANEL
Iron: 41 ug/dL — ABNORMAL LOW (ref 42–165)
Saturation Ratios: 8.9 % — ABNORMAL LOW (ref 20.0–50.0)
Transferrin: 330 mg/dL (ref 212.0–360.0)

## 2017-12-05 LAB — FERRITIN: FERRITIN: 9.3 ng/mL — AB (ref 22.0–322.0)

## 2017-12-05 LAB — VITAMIN B12: Vitamin B-12: 456 pg/mL (ref 211–911)

## 2017-12-05 MED ORDER — FERROUS SULFATE 325 (65 FE) MG PO TABS
325.0000 mg | ORAL_TABLET | Freq: Two times a day (BID) | ORAL | 1 refills | Status: DC
Start: 2017-12-05 — End: 2018-06-24

## 2017-12-05 NOTE — Progress Notes (Signed)
Subjective:  Patient ID: Eugene Mccullough, male    DOB: 1961/08/24  Age: 57 y.o. MRN: [id number]  CC: Anemia   HPI Eugene Mccullough presents for f/up - He recently had some lab work done that showed he was mildly anemic.  He comes back today for follow-up.  He has a history of peptic ulcer disease and another physician recently prescribed an NSAID which I have asked him to stop taking.  He denies abdominal pain, melena, bright red blood per rectum, or any sources of blood loss.  Outpatient Medications Prior to Visit  Medication Sig Dispense Refill  . atorvastatin (LIPITOR) 10 MG tablet Take 1 tablet (10 mg total) by mouth daily. 90 tablet 1  . cyclobenzaprine (FLEXERIL) 5 MG tablet TAKE 1 TABLET BY MOUTH THREE TIMES A DAY AS NEEDED FOR MUSCLE SPASMS 60 tablet 1  . finasteride (PROSCAR) 5 MG tablet Take 1 tablet (5 mg total) by mouth daily. 90 tablet 3  . metoprolol tartrate (LOPRESSOR) 50 MG tablet TAKE 1 TABLET BY MOUTH 2 TIMES DAILY 180 tablet 1  . omeprazole (PRILOSEC) 40 MG capsule TAKE 1 CAPSULE BY MOUTH TWICE A DAY 180 capsule 1   No facility-administered medications prior to visit.     ROS Review of Systems  Constitutional: Negative.  Negative for appetite change, diaphoresis, fatigue and unexpected weight change.  HENT: Negative.  Negative for trouble swallowing.   Eyes: Negative for visual disturbance.  Respiratory: Negative.  Negative for cough, chest tightness, shortness of breath and wheezing.   Cardiovascular: Negative for chest pain, palpitations and leg swelling.  Gastrointestinal: Negative for abdominal pain, anal bleeding, blood in stool, constipation, diarrhea, nausea and vomiting.  Endocrine: Negative.   Genitourinary: Negative.  Negative for difficulty urinating.  Musculoskeletal: Negative for arthralgias and myalgias.  Skin: Negative.  Negative for color change, pallor and rash.  Allergic/Immunologic: Negative.   Neurological: Negative.  Negative for dizziness,  weakness and numbness.  Hematological: Negative for adenopathy. Does not bruise/bleed easily.  Psychiatric/Behavioral: Negative.     Objective:  BP 114/72 (BP Location: Left Arm, Patient Position: Sitting, Cuff Size: Large)   Pulse 85   Temp 98.6 F (37 C) (Oral)   Resp 16   Ht 5\' 7"  (1.702 m)   Wt 215 lb (97.5 kg)   SpO2 97%   BMI 33.67 kg/m   BP Readings from Last 3 Encounters:  12/05/17 114/72  11/28/17 110/78  11/12/17 112/68    Wt Readings from Last 3 Encounters:  12/05/17 215 lb (97.5 kg)  11/28/17 215 lb (97.5 kg)  11/12/17 216 lb (98 kg)    Physical Exam  Constitutional: He is oriented to person, place, and time. No distress.  HENT:  Mouth/Throat: Oropharynx is clear and moist. No oropharyngeal exudate.  Eyes: Conjunctivae are normal. No scleral icterus.  Neck: Normal range of motion. Neck supple. No JVD present. No thyromegaly present.  Cardiovascular: Normal rate, regular rhythm, normal heart sounds and intact distal pulses. Exam reveals no gallop and no friction rub.  No murmur heard. Pulmonary/Chest: Effort normal and breath sounds normal. No stridor. No respiratory distress. He has no wheezes. He has no rales. He exhibits no tenderness.  Abdominal: Soft. Bowel sounds are normal. He exhibits no distension and no mass. There is no tenderness. There is no rebound and no guarding. No hernia.  Genitourinary: Rectal exam shows guaiac negative stool.  Genitourinary Comments: He refused a DRE but he did go to the bathroom and submit 3 different stool  cards which were all Hemoccult negative.  Musculoskeletal: Normal range of motion. He exhibits no edema, tenderness or deformity.  Lymphadenopathy:    He has no cervical adenopathy.  Neurological: He is alert and oriented to person, place, and time.  Skin: Skin is warm and dry. He is not diaphoretic.  Vitals reviewed.   Lab Results  Component Value Date   WBC 5.5 12/05/2017   HGB 12.7 (L) 12/05/2017   HCT 38.5  (L) 12/05/2017   PLT 143.0 (L) 12/05/2017   GLUCOSE 110 (H) 11/28/2017   CHOL 183 02/25/2017   TRIG 179.0 (H) 02/25/2017   HDL 38.60 (L) 02/25/2017   LDLDIRECT 136.0 05/09/2016   LDLCALC 109 (H) 02/25/2017   ALT 23 11/12/2017   AST 17 11/12/2017   NA 137 11/28/2017   K 4.5 11/28/2017   CL 104 11/28/2017   CREATININE 1.18 11/28/2017   BUN 16 11/28/2017   CO2 28 11/28/2017   TSH 0.89 11/12/2017   PSA 0.48 05/09/2016   HGBA1C 5.9 02/25/2017    Mr Lumbar Spine Wo Contrast  Result Date: 09/22/2017 CLINICAL DATA:  Low back pain with right leg numbness for 6 months. EXAM: MRI LUMBAR SPINE WITHOUT CONTRAST TECHNIQUE: Multiplanar, multisequence MR imaging of the lumbar spine was performed. No intravenous contrast was administered. COMPARISON:  12/24/2012 FINDINGS: Segmentation:  12/24/2012 Alignment:  Normal Vertebrae:  No fracture, evidence of discitis, or bone lesion. Conus medullaris and cauda equina: Conus extends to the L1-2 level. Conus and cauda equina appear normal. Paraspinal and other soft tissues: Left renal cystic intensity. Disc levels: T12- L1: Unremarkable. L1-L2: Unremarkable. L2-L3: Unremarkable. L3-L4: Unremarkable. L4-L5: Mild disc narrowing with left foraminal protrusion that is shallow. Stable mild left foraminal stenosis. Patent spinal canal L5-S1:Significant regression of a right paracentral disc extrusion. A small hypointense protrusion is still seen right paracentral, impinging on the right S1 nerve root. Disc narrowing and desiccation which may have progressed. The foramina are crowded without compression. IMPRESSION: 1. L5-S1 regression of right paracentral disc extrusion compared to 2014. There is still a small right paracentral protrusion compressing the right S1 nerve root. No other explanation for right leg symptoms. 2. L4-5 left foraminal protrusion with mild foraminal stenosis, chronic. Electronically Signed   By: Monte Fantasia M.D.   On: 09/22/2017 18:15     Assessment & Plan:   Eugene Mccullough was seen today for anemia.  Diagnoses and all orders for this visit:  Deficiency anemia- As below -     Reticulocytes; Future  PUD (peptic ulcer disease)- I have asked him to avoid anti-inflammatories and to continue taking the PPI. -     Ambulatory referral to Gastroenterology  Other iron deficiency anemia- He has developed a mild iron deficiency anemia.  Will start an iron supplement.  I have also asked him to see GI to see if he needs to be screened for occult GI sources of blood loss. -     ferrous sulfate 325 (65 FE) MG tablet; Take 1 tablet (325 mg total) by mouth 2 (two) times daily with a meal.   I am having Eugene Mccullough start on ferrous sulfate. I am also having him maintain his finasteride, atorvastatin, metoprolol tartrate, cyclobenzaprine, and omeprazole.  Meds ordered this encounter  Medications  . ferrous sulfate 325 (65 FE) MG tablet    Sig: Take 1 tablet (325 mg total) by mouth 2 (two) times daily with a meal.    Dispense:  180 tablet    Refill:  1  Follow-up: Return in about 3 months (around 03/06/2018).  Scarlette Calico, MD

## 2017-12-05 NOTE — Patient Instructions (Signed)
Anemia Anemia is a condition in which you do not have enough red blood cells or hemoglobin. Hemoglobin is a substance in red blood cells that carries oxygen. When you do not have enough red blood cells or hemoglobin (are anemic), your body cannot get enough oxygen and your organs may not work properly. As a result, you may feel very tired or have other problems. What are the causes? Common causes of anemia include:  Excessive bleeding. Anemia can be caused by excessive bleeding inside or outside the body, including bleeding from the intestine or from periods in women.  Poor nutrition.  Long-lasting (chronic) kidney, thyroid, and liver disease.  Bone marrow disorders.  Cancer and treatments for cancer.  HIV (human immunodeficiency virus) and AIDS (acquired immunodeficiency syndrome).  Treatments for HIV and AIDS.  Spleen problems.  Blood disorders.  Infections, medicines, and autoimmune disorders that destroy red blood cells.  What are the signs or symptoms? Symptoms of this condition include:  Minor weakness.  Dizziness.  Headache.  Feeling heartbeats that are irregular or faster than normal (palpitations).  Shortness of breath, especially with exercise.  Paleness.  Cold sensitivity.  Indigestion.  Nausea.  Difficulty sleeping.  Difficulty concentrating.  Symptoms may occur suddenly or develop slowly. If your anemia is mild, you may not have symptoms. How is this diagnosed? This condition is diagnosed based on:  Blood tests.  Your medical history.  A physical exam.  Bone marrow biopsy.  Your health care provider may also check your stool (feces) for blood and may do additional testing to look for the cause of your bleeding. You may also have other tests, including:  Imaging tests, such as a CT scan or MRI.  Endoscopy.  Colonoscopy.  How is this treated? Treatment for this condition depends on the cause. If you continue to lose a lot of blood,  you may need to be treated at a hospital. Treatment may include:  Taking supplements of iron, vitamin V61, or folic acid.  Taking a hormone medicine (erythropoietin) that can help to stimulate red blood cell growth.  Having a blood transfusion. This may be needed if you lose a lot of blood.  Making changes to your diet.  Having surgery to remove your spleen.  Follow these instructions at home:  Take over-the-counter and prescription medicines only as told by your health care provider.  Take supplements only as told by your health care provider.  Follow any diet instructions that you were given.  Keep all follow-up visits as told by your health care provider. This is important. Contact a health care provider if:  You develop new bleeding anywhere in the body. Get help right away if:  You are very weak.  You are short of breath.  You have pain in your abdomen or chest.  You are dizzy or feel faint.  You have trouble concentrating.  You have bloody or black, tarry stools.  You vomit repeatedly or you vomit up blood. Summary  Anemia is a condition in which you do not have enough red blood cells or enough of a substance in your red blood cells that carries oxygen (hemoglobin).  Symptoms may occur suddenly or develop slowly.  If your anemia is mild, you may not have symptoms.  This condition is diagnosed with blood tests as well as a medical history and physical exam. Other tests may be needed.  Treatment for this condition depends on the cause of the anemia. This information is not intended to replace advice  given to you by your health care provider. Make sure you discuss any questions you have with your health care provider. Document Released: 09/20/2004 Document Revised: 09/14/2016 Document Reviewed: 09/14/2016 Elsevier Interactive Patient Education  [employer].

## 2017-12-06 ENCOUNTER — Encounter: Payer: Self-pay | Admitting: Internal Medicine

## 2017-12-08 LAB — VITAMIN B1: VITAMIN B1 (THIAMINE): 14 nmol/L (ref 8–30)

## 2017-12-08 LAB — RETICULOCYTES
ABS Retic: 95000 cells/uL — ABNORMAL HIGH (ref 25000–9000)
Retic Ct Pct: 1.9 %

## 2017-12-15 ENCOUNTER — Other Ambulatory Visit: Payer: Self-pay | Admitting: Family Medicine

## 2017-12-15 DIAGNOSIS — M5416 Radiculopathy, lumbar region: Secondary | ICD-10-CM

## 2017-12-16 ENCOUNTER — Other Ambulatory Visit: Payer: Self-pay | Admitting: Internal Medicine

## 2017-12-16 DIAGNOSIS — I10 Essential (primary) hypertension: Secondary | ICD-10-CM

## 2018-01-10 ENCOUNTER — Other Ambulatory Visit: Payer: Self-pay | Admitting: Internal Medicine

## 2018-01-10 DIAGNOSIS — I10 Essential (primary) hypertension: Secondary | ICD-10-CM

## 2018-01-25 ENCOUNTER — Encounter: Payer: Self-pay | Admitting: Internal Medicine

## 2018-02-04 ENCOUNTER — Other Ambulatory Visit (INDEPENDENT_AMBULATORY_CARE_PROVIDER_SITE_OTHER): Payer: BLUE CROSS/BLUE SHIELD

## 2018-02-04 ENCOUNTER — Encounter: Payer: Self-pay | Admitting: Gastroenterology

## 2018-02-04 ENCOUNTER — Ambulatory Visit: Payer: BLUE CROSS/BLUE SHIELD | Admitting: Gastroenterology

## 2018-02-04 VITALS — BP 120/80 | HR 84 | Ht 65.5 in | Wt 209.1 lb

## 2018-02-04 DIAGNOSIS — G8929 Other chronic pain: Secondary | ICD-10-CM

## 2018-02-04 DIAGNOSIS — R1013 Epigastric pain: Secondary | ICD-10-CM | POA: Diagnosis not present

## 2018-02-04 DIAGNOSIS — D509 Iron deficiency anemia, unspecified: Secondary | ICD-10-CM | POA: Diagnosis not present

## 2018-02-04 LAB — CBC WITH DIFFERENTIAL/PLATELET
BASOS PCT: 1 % (ref 0.0–3.0)
Basophils Absolute: 0 10*3/uL (ref 0.0–0.1)
EOS ABS: 0.1 10*3/uL (ref 0.0–0.7)
Eosinophils Relative: 2.7 % (ref 0.0–5.0)
HEMATOCRIT: 41.8 % (ref 39.0–52.0)
HEMOGLOBIN: 14.5 g/dL (ref 13.0–17.0)
LYMPHS PCT: 33.5 % (ref 12.0–46.0)
Lymphs Abs: 1.7 10*3/uL (ref 0.7–4.0)
MCHC: 34.7 g/dL (ref 30.0–36.0)
MCV: 80.2 fl (ref 78.0–100.0)
MONO ABS: 0.4 10*3/uL (ref 0.1–1.0)
Monocytes Relative: 8.5 % (ref 3.0–12.0)
Neutro Abs: 2.7 10*3/uL (ref 1.4–7.7)
Neutrophils Relative %: 54.3 % (ref 43.0–77.0)
Platelets: 128 10*3/uL — ABNORMAL LOW (ref 150.0–400.0)
RBC: 5.21 Mil/uL (ref 4.22–5.81)
RDW: 19.3 % — AB (ref 11.5–15.5)
WBC: 5 10*3/uL (ref 4.0–10.5)

## 2018-02-04 NOTE — Patient Instructions (Addendum)
You will be set up for an upper endoscopy for epigastric pain, h/o h. Pylori, IDA. You will have labs checked today in the basement lab.  Please head down after you check out with the front desk  (cbc).  Possibly will decrease iron supplement after reviewing those results.  Normal BMI (Body Mass Index- based on height and weight) is between 19 and 25. Your BMI today is Body mass index is 34.27 kg/m. Marland Kitchen Please consider follow up  regarding your BMI with your Primary Care Provider.

## 2018-02-04 NOTE — Progress Notes (Signed)
HPI: [name] who was referred to me by Eugene Lima, MD  to evaluate epigastric pain, anemia.    Chief complaint is epigastric pain, anemia Recent lab testing April 2019 show hemoglobin 12.7, MCV 76, platelets 143, ferritin was low at 9.3 as was total iron.  Started iron supplements one pill BID, dark stools since then.  "Same thing for 20 yeafrs"  Ulcer pains, then IBS pains.  Epigastric pains that radiate to the back.  This is always the way his stomach, ulcer problems present he tells me.  It is been most problematic lately for the past 2 months. EAting helps.   Has been gaining weight 15 pounds in 2-3 months.  He takes alleve PRN.  Two pills twice per week  Takes omeprazole in AM.  He has not seen any bleeding overtly however since starting iron supplements 6 weeks ago his stools are a bit darker than usual.   Old Data Reviewed: He was last here in our office a little over 3 years ago and he saw Eugene Mccullough at that time  I did an EGD and colonoscopy in 2012 for him.  I found H. pylori related gastritis.  Left-sided diverticulosis and external hemorrhoids.  Random biopsies from his colon were normal.  He had been found to have Endolimax nana by parasite examination.  I felt it might have been pathologic genic and so started him on Flagyl for 1 week.  Terminal ileum was normal      Review of systems: Pertinent positive and negative review of systems were noted in the above HPI section. All other review negative.   Past Medical History:  Diagnosis Date  . Eczema   . Gastric ulcer   . GERD (gastroesophageal reflux disease)   . Gout   . H. pylori infection   . Hyperlipidemia   . Hypertension   . IBS (irritable bowel syndrome)     Past Surgical History:  Procedure Laterality Date  . CHOLECYSTECTOMY  2005    Current Outpatient Medications  Medication Sig Dispense Refill  . atorvastatin (LIPITOR) 10 MG tablet Take 1 tablet (10 mg total)  by mouth daily. 90 tablet 1  . cyclobenzaprine (FLEXERIL) 5 MG tablet TAKE 1 TABLET BY MOUTH THREE TIMES A DAY AS NEEDED FOR MUSCLE SPASMS 60 tablet 1  . ferrous sulfate 325 (65 FE) MG tablet Take 1 tablet (325 mg total) by mouth 2 (two) times daily with a meal. 180 tablet 1  . finasteride (PROSCAR) 5 MG tablet Take 1 tablet (5 mg total) by mouth daily. 90 tablet 3  . metoprolol tartrate (LOPRESSOR) 50 MG tablet Take 1 tablet (50 mg total) by mouth 2 (two) times daily. 180 tablet 1  . omeprazole (PRILOSEC) 40 MG capsule TAKE 1 CAPSULE BY MOUTH TWICE A DAY 180 capsule 1   No current facility-administered medications for this visit.     Allergies as of 02/04/2018 - Review Complete 02/04/2018  Allergen Reaction Noted  . Hydrocodone-acetaminophen Itching     Family History  Problem Relation Age of Onset  . Hyperlipidemia Father   . Crohn's disease Brother   . Cancer Neg Hx   . Early death Neg Hx   . Heart disease Neg Hx   . Hypertension Neg Hx   . Kidney disease Neg Hx   . Stroke Neg Hx   . Alcohol abuse Neg Hx   . Diabetes Neg Hx     Social History  Socioeconomic History  . Marital status: Married    Spouse name: Not on file  . Number of children: 3  . Years of education: Not on file  . Highest education level: Not on file  Occupational History  . Occupation: driver  Social Needs  . Financial resource strain: Not on file  . Food insecurity:    Worry: Not on file    Inability: Not on file  . Transportation needs:    Medical: Not on file    Non-medical: Not on file  Tobacco Use  . Smoking status: Former Smoker    Packs/day: 1.00    Years: 20.00    Pack years: 20.00    Types: Cigarettes    Last attempt to quit: 09/27/2012    Years since quitting: 5.3  . Smokeless tobacco: Never Used  Substance and Sexual Activity  . Alcohol use: No    Alcohol/week: 0.0 oz  . Drug use: No  . Sexual activity: Not Currently  Lifestyle  . Physical activity:    Days per week: Not on  file    Minutes per session: Not on file  . Stress: Not on file  Relationships  . Social connections:    Talks on phone: Not on file    Gets together: Not on file    Attends religious service: Not on file    Active member of club or organization: Not on file    Attends meetings of clubs or organizations: Not on file    Relationship status: Not on file  . Intimate partner violence:    Fear of current or ex partner: Not on file    Emotionally abused: Not on file    Physically abused: Not on file    Forced sexual activity: Not on file  Other Topics Concern  . Not on file  Social History Narrative  . Not on file     Physical Exam: BP 120/80 (BP Location: Left Arm, Patient Position: Sitting, Cuff Size: Normal)   Pulse 84   Ht 5' 5.5" (1.664 m) Comment: height measured without shoes  Wt 209 lb 2 oz (94.9 kg)   BMI 34.27 kg/m  Constitutional: generally well-appearing Psychiatric: alert and oriented x3 Eyes: extraocular movements intact Mouth: oral pharynx moist, no lesions Neck: supple no lymphadenopathy Cardiovascular: heart regular rate and rhythm Lungs: clear to auscultation bilaterally Abdomen: soft, nontender, nondistended, no obvious ascites, no peritoneal signs, normal bowel sounds Extremities: no lower extremity edema bilaterally Skin: no lesions on visible extremities   Assessment and plan: 57 y.o. male with history of peptic ulcer disease, H. pylori now with mild iron deficiency anemia, epigastric pains  Very possibly recurrent peptic ulcer disease.  He is on omeprazole once to twice daily already.  Iron supplements started 6 weeks ago.  He will have a repeat CBC today to check to see his response to the iron.  I recommend repeat EGD at his soonest convenience.  The last one was about 7 years ago.  If there is no clear explanation for his iron deficiency then I would likely arrange for colonoscopy after that.    Please see the "Patient Instructions" section for  addition details about the plan.   Eugene Loffler, MD [facility] 02/04/2018, 9:49 AM  Cc: Eugene Lima, MD

## 2018-02-21 ENCOUNTER — Encounter: Payer: Self-pay | Admitting: Family

## 2018-02-21 ENCOUNTER — Ambulatory Visit: Payer: BLUE CROSS/BLUE SHIELD | Admitting: Family

## 2018-02-21 ENCOUNTER — Other Ambulatory Visit (INDEPENDENT_AMBULATORY_CARE_PROVIDER_SITE_OTHER): Payer: BLUE CROSS/BLUE SHIELD

## 2018-02-21 VITALS — BP 110/82 | HR 94 | Temp 98.2°F | Ht 65.5 in | Wt 207.1 lb

## 2018-02-21 DIAGNOSIS — R109 Unspecified abdominal pain: Secondary | ICD-10-CM

## 2018-02-21 LAB — COMPREHENSIVE METABOLIC PANEL
ALT: 36 U/L (ref 0–53)
AST: 21 U/L (ref 0–37)
Albumin: 4.1 g/dL (ref 3.5–5.2)
Alkaline Phosphatase: 69 U/L (ref 39–117)
BILIRUBIN TOTAL: 0.5 mg/dL (ref 0.2–1.2)
BUN: 9 mg/dL (ref 6–23)
CO2: 25 meq/L (ref 19–32)
Calcium: 9.4 mg/dL (ref 8.4–10.5)
Chloride: 105 mEq/L (ref 96–112)
Creatinine, Ser: 0.98 mg/dL (ref 0.40–1.50)
GFR: 101.23 mL/min (ref >60.00)
Glucose, Bld: 101 mg/dL — ABNORMAL HIGH (ref 70–99)
Potassium: 3.7 mEq/L (ref 3.5–5.1)
Sodium: 138 mEq/L (ref 135–145)
Total Protein: 7.8 g/dL (ref 6.0–8.3)

## 2018-02-21 LAB — CBC WITH DIFFERENTIAL/PLATELET
BASOS ABS: 0 10*3/uL (ref 0.0–0.1)
BASOS PCT: 0.7 % (ref 0.0–3.0)
Eosinophils Absolute: 0.2 10*3/uL (ref 0.0–0.7)
Eosinophils Relative: 3.4 % (ref 0.0–5.0)
HEMATOCRIT: 40.5 % (ref 39.0–52.0)
Hemoglobin: 14 g/dL (ref 13.0–17.0)
LYMPHS PCT: 42 % (ref 12.0–46.0)
Lymphs Abs: 2.4 10*3/uL (ref 0.7–4.0)
MCHC: 34.6 g/dL (ref 30.0–36.0)
MCV: 81.1 fl (ref 78.0–100.0)
MONOS PCT: 13.2 % — AB (ref 3.0–12.0)
Monocytes Absolute: 0.7 10*3/uL (ref 0.1–1.0)
NEUTROS ABS: 2.3 10*3/uL (ref 1.4–7.7)
NEUTROS PCT: 40.7 % — AB (ref 43.0–77.0)
PLATELETS: 161 10*3/uL (ref 150.0–400.0)
RBC: 4.99 Mil/uL (ref 4.22–5.81)
RDW: 18.1 % — AB (ref 11.5–15.5)
WBC: 5.7 10*3/uL (ref 4.0–10.5)

## 2018-02-21 MED ORDER — CIPROFLOXACIN HCL 500 MG PO TABS: 500.0000 mg | ORAL_TABLET | Freq: Two times a day (BID) | ORAL | 0 refills | Status: DC

## 2018-02-21 MED ORDER — METRONIDAZOLE 500 MG PO TABS: 500.0000 mg | ORAL_TABLET | Freq: Three times a day (TID) | ORAL | 0 refills | Status: DC

## 2018-02-21 NOTE — Patient Instructions (Signed)
Food Choices to Help Relieve Diarrhea, Adult  When you have diarrhea, the foods you eat and your eating habits are very important. Choosing the right foods and drinks can help:  · Relieve diarrhea.  · Replace lost fluids and nutrients.  · Prevent dehydration.    What general guidelines should I follow?  Relieving diarrhea  · Choose foods with less than 2 g or .07 oz. of fiber per serving.  · Limit fats to less than 8 tsp (38 g or 1.34 oz.) a day.  · Avoid the following:  ? Foods and beverages sweetened with high-fructose corn syrup, honey, or sugar alcohols such as xylitol, sorbitol, and mannitol.  ? Foods that contain a lot of fat or sugar.  ? Fried, greasy, or spicy foods.  ? High-fiber grains, breads, and cereals.  ? Raw fruits and vegetables.  · Eat foods that are rich in probiotics. These foods include dairy products such as yogurt and fermented milk products. They help increase healthy bacteria in the stomach and intestines (gastrointestinal tract, or GI tract).  · If you have lactose intolerance, avoid dairy products. These may make your diarrhea worse.  · Take medicine to help stop diarrhea (antidiarrheal medicine) only as told by your health care provider.  Replacing nutrients  · Eat small meals or snacks every 3–4 hours.  · Eat bland foods, such as white rice, toast, or baked potato, until your diarrhea starts to get better. Gradually reintroduce nutrient-rich foods as tolerated or as told by your health care provider. This includes:  ? Well-cooked protein foods.  ? Peeled, seeded, and soft-cooked fruits and vegetables.  ? Low-fat dairy products.  · Take vitamin and mineral supplements as told by your health care provider.  Preventing dehydration    · Start by sipping water or a special solution to prevent dehydration (oral rehydration solution, ORS). Urine that is clear or pale yellow means that you are getting enough fluid.  · Try to drink at least 8–10 cups of fluid each day to help replace lost  fluids.  · You may add other liquids in addition to water, such as clear juice or decaffeinated sports drinks, as tolerated or as told by your health care provider.  · Avoid drinks with caffeine, such as coffee, tea, or soft drinks.  · Avoid alcohol.  What foods are recommended?  The items listed may not be a complete list. Talk with your health care provider about what dietary choices are best for you.  Grains  White rice. White, French, or pita breads (fresh or toasted), including plain rolls, buns, or bagels. White pasta. Saltine, soda, or graham crackers. Pretzels. Low-fiber cereal. Cooked cereals made with water (such as cornmeal, farina, or cream cereals). Plain muffins. Matzo. Melba toast. Zwieback.  Vegetables  Potatoes (without the skin). Most well-cooked and canned vegetables without skins or seeds. Tender lettuce.  Fruits  Apple sauce. Fruits canned in juice. Cooked apricots, cherries, grapefruit, peaches, pears, or plums. Fresh bananas and cantaloupe.  Meats and other protein foods  Baked or boiled chicken. Eggs. Tofu. Fish. Seafood. Smooth nut butters. Ground or well-cooked tender beef, ham, veal, lamb, pork, or poultry.  Dairy  Plain yogurt, kefir, and unsweetened liquid yogurt. Lactose-free milk, buttermilk, skim milk, or soy milk. Low-fat or nonfat hard cheese.  Beverages  Water. Low-calorie sports drinks. Fruit juices without pulp. Strained tomato and vegetable juices. Decaffeinated teas. Sugar-free beverages not sweetened with sugar alcohols. Oral rehydration solutions, if approved by your health care   provider.  Seasoning and other foods  Bouillon, broth, or soups made from recommended foods.  What foods are not recommended?  The items listed may not be a complete list. Talk with your health care provider about what dietary choices are best for you.  Grains  Whole grain, whole wheat, bran, or rye breads, rolls, pastas, and crackers. Wild or brown rice. Whole grain or bran cereals. Barley. Oats  and oatmeal. Corn tortillas or taco shells. Granola. Popcorn.  Vegetables  Raw vegetables. Fried vegetables. Cabbage, broccoli, Brussels sprouts, artichokes, baked beans, beet greens, corn, kale, legumes, peas, sweet potatoes, and yams. Potato skins. Cooked spinach and cabbage.  Fruits  Dried fruit, including raisins and dates. Raw fruits. Stewed or dried prunes. Canned fruits with syrup.  Meat and other protein foods  Fried or fatty meats. Deli meats. Chunky nut butters. Nuts and seeds. Beans and lentils. Bacon. Hot dogs. Sausage.  Dairy  High-fat cheeses. Whole milk, chocolate milk, and beverages made with milk, such as milk shakes. Half-and-half. Cream. sour cream. Ice cream.  Beverages  Caffeinated beverages (such as coffee, tea, soda, or energy drinks). Alcoholic beverages. Fruit juices with pulp. Prune juice. Soft drinks sweetened with high-fructose corn syrup or sugar alcohols. High-calorie sports drinks.  Fats and oils  Butter. Cream sauces. Margarine. Salad oils. Plain salad dressings. Olives. Avocados. Mayonnaise.  Sweets and desserts  Sweet rolls, doughnuts, and sweet breads. Sugar-free desserts sweetened with sugar alcohols such as xylitol and sorbitol.  Seasoning and other foods  Honey. Hot sauce. Chili powder. Gravy. Cream-based or milk-based soups. Pancakes and waffles.  Summary  · When you have diarrhea, the foods you eat and your eating habits are very important.  · Make sure you get at least 8–10 cups of fluid each day, or enough to keep your urine clear or pale yellow.  · Eat bland foods and gradually reintroduce healthy, nutrient-rich foods as tolerated, or as told by your health care provider.  · Avoid high-fiber, fried, greasy, or spicy foods.  This information is not intended to replace advice given to you by your health care provider. Make sure you discuss any questions you have with your health care provider.  Document Released: 11/03/2003 Document Revised: 08/10/2016 Document  Reviewed: 08/10/2016  Elsevier Interactive Patient Education © 2018 Elsevier Inc.

## 2018-02-21 NOTE — Progress Notes (Signed)
Eugene Mccullough is a 57 y.o. male with the following history as recorded in Epic[employer]:  Patient Active Problem List   Diagnosis Date Noted  . Absolute anemia 11/29/2017  . Xerosis of skin 11/28/2017  . Lumbar radiculopathy 09/12/2017  . Bilateral leg pain 08/12/2017  . Condyloma acuminata 06/25/2017  . Dyslipidemia 04/26/2017  . TMJ (sprain of temporomandibular joint) 08/09/2016  . [facility] DJD(carpometacarpal degenerative joint disease), localized primary 05/09/2016  . PUD (peptic ulcer disease) 10/28/2014  . IBS (irritable bowel syndrome) 09/13/2014  . Other constipation 07/01/2014  . Allergic rhinitis, cause unspecified 01/15/2014  . Erectile dysfunction 10/12/2013  . Dermatitis, atopic 05/05/2013  . Hemorrhoids, internal, without mention of complications 16/05/9603  . Obstructive sleep apnea 02/02/2013  . Routine general medical examination at a health care facility 01/02/2013  . BPH (benign prostatic hypertrophy) with urinary retention 01/02/2013  . Gout 01/02/2013  . Hypertension 08/15/2011  . Obesity (BMI 30-39.9)     Current Outpatient Medications  Medication Sig Dispense Refill  . alclomethasone (ACLOVATE) 5.40 % cream 1 APPLICATION TO AFFECTED AREA ONCE A DAY AS NEEDED FOR FACE FOR ECZEMA EXTERNALLY 14 DAYS  0  . atorvastatin (LIPITOR) 10 MG tablet Take 1 tablet (10 mg total) by mouth daily. 90 tablet 1  . cyclobenzaprine (FLEXERIL) 5 MG tablet TAKE 1 TABLET BY MOUTH THREE TIMES A DAY AS NEEDED FOR MUSCLE SPASMS 60 tablet 1  . ferrous sulfate 325 (65 FE) MG tablet Take 1 tablet (325 mg total) by mouth 2 (two) times daily with a meal. 180 tablet 1  . finasteride (PROSCAR) 5 MG tablet Take 1 tablet (5 mg total) by mouth daily. 90 tablet 3  . hydrOXYzine (ATARAX/VISTARIL) 10 MG tablet TAKE 1 TABLET BY MOUTH AT BEDTIME AS DIRECTED  3  . metoprolol tartrate (LOPRESSOR) 50 MG tablet Take 1 tablet (50 mg total) by mouth 2 (two) times daily. 180 tablet 1  . omeprazole (PRILOSEC) 40 MG  capsule TAKE 1 CAPSULE BY MOUTH TWICE A DAY 180 capsule 1  . ciprofloxacin (CIPRO) 500 MG tablet Take 1 tablet (500 mg total) by mouth 2 (two) times daily. 14 tablet 0  . metroNIDAZOLE (FLAGYL) 500 MG tablet Take 1 tablet (500 mg total) by mouth 3 (three) times daily. 21 tablet 0   No current facility-administered medications for this visit.     Allergies: Hydrocodone-acetaminophen  Past Medical History:  Diagnosis Date  . Eczema   . Gastric ulcer   . GERD (gastroesophageal reflux disease)   . Gout   . H. pylori infection   . Hyperlipidemia   . Hypertension   . IBS (irritable bowel syndrome)     Past Surgical History:  Procedure Laterality Date  . CHOLECYSTECTOMY  2005    Family History  Problem Relation Age of Onset  . Hyperlipidemia Father   . Crohn's disease Brother   . Cancer Neg Hx   . Early death Neg Hx   . Heart disease Neg Hx   . Hypertension Neg Hx   . Kidney disease Neg Hx   . Stroke Neg Hx   . Alcohol abuse Neg Hx   . Diabetes Neg Hx     Social History   Tobacco Use  . Smoking status: Former Smoker    Packs/day: 1.00    Years: 20.00    Pack years: 20.00    Types: Cigarettes    Last attempt to quit: 09/27/2012    Years since quitting: 5.4  . Smokeless tobacco: Never Used  Substance Use Topics  . Alcohol use: No    Alcohol/week: 0.0 oz    Subjective:  Patient presents with concerns for 1 week history of nausea, diarrhea, recurrent vomiting; symptoms started last Saturday after eating at a fast food restaurant; no one else at home sick with similar symptoms; no known fever; no blood in diarrhea, no coffee grounds emesis; has been taking an OTC medication from [place] called "green" which has helped some with pain;  Has known history of IBS- does have occasional problems with diarrhea and constipation; scheduled for endoscopy in early August; notes these symptoms today feel very different than an IBS flare.   Objective:  Vitals:   02/21/18 1547  BP: 110/82   Pulse: 94  Temp: 98.2 F (36.8 C)  TempSrc: Oral  SpO2: 97%  Weight: 207 lb 1.3 oz (93.9 kg)  Height: 5' 5.5" (1.664 m)    General: Well developed, well nourished, in no acute distress  Skin : Warm and dry.  Lungs: Respirations unlabored; clear to auscultation bilaterally without wheeze, rales, rhonchi  CVS exam: normal rate and regular rhythm.  Abdomen: Soft; nontender; nondistended; normoactive bowel sounds; no masses or hepatosplenomegaly  Musculoskeletal: No deformities; no active joint inflammation  Extremities: No edema, cyanosis, clubbing  Vessels: Symmetric bilaterally  Neurologic: Alert and oriented; speech intact; face symmetrical; moves all extremities well; CNII-XII intact without focal deficit  Assessment:  1. Abdominal pain, unspecified abdominal location     Plan:  Patient is seen at 4 pm on a Friday so no outpatient imaging can be done; will update CBC, CMP today; Will treat with combination of Flagyl and Cipro; strict ER precautions for upcoming weekend; follow-up to be determined; he already has established relationship with GI- may need to refer back if symptoms persist.  No follow-ups on file.  Orders Placed This Encounter  Procedures  . CBC w/Diff    Standing Status:   Future    Number of Occurrences:   1    Standing Expiration Date:   02/21/2019  . Comp Met (CMET)    Standing Status:   Future    Number of Occurrences:   1    Standing Expiration Date:   02/21/2019    Requested Prescriptions   Signed Prescriptions Disp Refills  . ciprofloxacin (CIPRO) 500 MG tablet 14 tablet 0    Sig: Take 1 tablet (500 mg total) by mouth 2 (two) times daily.  . metroNIDAZOLE (FLAGYL) 500 MG tablet 21 tablet 0    Sig: Take 1 tablet (500 mg total) by mouth 3 (three) times daily.

## 2018-04-02 ENCOUNTER — Ambulatory Visit (AMBULATORY_SURGERY_CENTER): Payer: BLUE CROSS/BLUE SHIELD | Admitting: Gastroenterology

## 2018-04-02 ENCOUNTER — Encounter: Payer: Self-pay | Admitting: Gastroenterology

## 2018-04-02 VITALS — BP 129/91 | HR 98 | Temp 98.9°F | Resp 16 | Ht 65.0 in | Wt 209.0 lb

## 2018-04-02 DIAGNOSIS — R1013 Epigastric pain: Secondary | ICD-10-CM | POA: Diagnosis not present

## 2018-04-02 DIAGNOSIS — D509 Iron deficiency anemia, unspecified: Secondary | ICD-10-CM | POA: Diagnosis not present

## 2018-04-02 MED ORDER — SODIUM CHLORIDE 0.9 % IV SOLN: 500.0000 mL | Freq: Once | INTRAVENOUS | Status: DC

## 2018-04-02 NOTE — Patient Instructions (Signed)
  Thank you for allowing [place] to care for you today!   Await pathology results by mail, 2-3 weeks.      YOU HAD AN ENDOSCOPIC PROCEDURE TODAY AT [facility]:   Refer to the procedure report that was given to you for any specific questions about what was found during the examination.  If the procedure report does not answer your questions, please call your gastroenterologist to clarify.  If you requested that your care partner not be given the details of your procedure findings, then the procedure report has been included in a sealed envelope for you to review at your convenience later.  YOU SHOULD EXPECT: Some feelings of bloating in the abdomen. Passage of more gas than usual.  Walking can help get rid of the air that was put into your GI tract during the procedure and reduce the bloating. If you had a lower endoscopy (such as a colonoscopy or flexible sigmoidoscopy) you may notice spotting of blood in your stool or on the toilet paper. If you underwent a bowel prep for your procedure, you may not have a normal bowel movement for a few days.  Please Note:  You might notice some irritation and congestion in your nose or some drainage.  This is from the oxygen used during your procedure.  There is no need for concern and it should clear up in a day or so.  SYMPTOMS TO REPORT IMMEDIATELY:     Following upper endoscopy (EGD)  Vomiting of blood or coffee ground material  New chest pain or pain under the shoulder blades  Painful or persistently difficult swallowing  New shortness of breath  Fever of 100F or higher  Black, tarry-looking stools  For urgent or emergent issues, a gastroenterologist can be reached at any hour by calling [phone].   DIET:  We do recommend a small meal at first, but then you may proceed to your regular diet.  Drink plenty of fluids but you should avoid alcoholic beverages for 24 hours.  ACTIVITY:  You should plan to take it easy for the  rest of today and you should NOT DRIVE or use heavy machinery until tomorrow (because of the sedation medicines used during the test).    FOLLOW UP: Our staff will call the number listed on your records the next business day following your procedure to check on you and address any questions or concerns that you may have regarding the information given to you following your procedure. If we do not reach you, we will leave a message.  However, if you are feeling well and you are not experiencing any problems, there is no need to return our call.  We will assume that you have returned to your regular daily activities without incident.  If any biopsies were taken you will be contacted by phone or by letter within the next 1-3 weeks.  Please call us at [phone] if you have not heard about the biopsies in 3 weeks.    SIGNATURES/CONFIDENTIALITY: You and/or your care partner have signed paperwork which will be entered into your electronic medical record.  These signatures attest to the fact that that the information above on your After Visit Summary has been reviewed and is understood.  Full responsibility of the confidentiality of this discharge information lies with you and/or your care-partner.

## 2018-04-02 NOTE — Op Note (Signed)
[place] Patient Name: Eugene Mccullough Procedure Date: 04/02/2018 3:46 PM MRN: [id number] Endoscopist: Milus Banister , MD Age: 57 Referring MD:  Date of Birth: 07/11/1961 Gender: Male Account #: [id number] Procedure:                Upper GI endoscopy Indications:              Generalized abdominal pain, mild IDA (corrected                            with iron supplements) Medicines:                Monitored Anesthesia Care Procedure:                Pre-Anesthesia Assessment:                           - Prior to the procedure, a History and Physical                            was performed, and patient medications and                            allergies were reviewed. The patient's tolerance of                            previous anesthesia was also reviewed. The risks                            and benefits of the procedure and the sedation                            options and risks were discussed with the patient.                            All questions were answered, and informed consent                            was obtained. Prior Anticoagulants: The patient has                            taken no previous anticoagulant or antiplatelet                            agents. ASA Grade Assessment: II - A patient with                            mild systemic disease. After reviewing the risks                            and benefits, the patient was deemed in                            satisfactory condition to undergo the procedure.  After obtaining informed consent, the endoscope was                            passed under direct vision. Throughout the                            procedure, the patient's blood pressure, pulse, and                            oxygen saturations were monitored continuously. The                            Model GIF-HQ190 [id number]) scope was introduced                            through the mouth, and advanced to  the second part                            of duodenum. The upper GI endoscopy was                            accomplished without difficulty. The patient                            tolerated the procedure well. Scope In: Scope Out: Findings:                 The esophagus was normal.                           The stomach was normal.                           The examined duodenum was normal. Complications:            No immediate complications. Estimated blood loss:                            None. Estimated Blood Loss:     Estimated blood loss: none. Impression:               - Normal esophagus.                           - Normal stomach.                           - Normal examined duodenum.                           - No specimens collected. Recommendation:           - Patient has a contact number available for                            emergencies. The signs and symptoms of potential  delayed complications were discussed with the                            patient. Return to normal activities tomorrow.                            Written discharge instructions were provided to the                            patient.                           - Resume previous diet.                           - Continue present medications.                           - Colonoscopy next available [facility] Dr. Ardis Hughs to                            continue workup of IDA. Milus Banister, MD 04/02/2018 4:15:07 PM This report has been signed electronically.

## 2018-04-02 NOTE — Progress Notes (Signed)
To PACU, VSS. Report to [email or web address] 

## 2018-04-02 NOTE — Progress Notes (Signed)
Pt's states no medical or surgical changes since previsit or office visit. 

## 2018-04-03 ENCOUNTER — Telehealth: Payer: Self-pay

## 2018-04-03 NOTE — Telephone Encounter (Signed)
  Follow up Call-  Call Niva Murren number 04/02/2018  Post procedure Call Mariellen Blaney phone  # [phone]  Permission to leave phone message Yes  Some recent data might be hidden     Patient questions:  Do you have a fever, pain , or abdominal swelling? No. Pain Score  0 *  Have you tolerated food without any problems? Yes.    Have you been able to return to your normal activities? Yes.    Do you have any questions about your discharge instructions: Diet   No. Medications  No. Follow up visit  No.  Do you have questions or concerns about your Care? No.  Actions: * If pain score is 4 or above: No action needed, pain <4.

## 2018-04-08 ENCOUNTER — Telehealth: Payer: Self-pay

## 2018-04-08 NOTE — Telephone Encounter (Signed)
The pt has been scheduled for previsit (9/24)and colon (10/1) The pt has been advised of the information and verbalized understanding.

## 2018-04-08 NOTE — Telephone Encounter (Signed)
Per procedure report pt needs to have repeat colonoscopy for IDA

## 2018-05-20 ENCOUNTER — Telehealth: Payer: Self-pay

## 2018-05-20 NOTE — Telephone Encounter (Signed)
Patient No Showed for PV. A message was left to call and reschedule before 5:00 Pm today. If patient does not reschedule a no show letter will be mailed and colonoscopy cancelled per [place] guidelines.   Riki Sheer, LPN

## 2018-05-27 ENCOUNTER — Other Ambulatory Visit: Payer: Self-pay | Admitting: Internal Medicine

## 2018-05-27 ENCOUNTER — Encounter: Payer: BLUE CROSS/BLUE SHIELD | Admitting: Gastroenterology

## 2018-05-27 DIAGNOSIS — D508 Other iron deficiency anemias: Secondary | ICD-10-CM

## 2018-06-05 ENCOUNTER — Other Ambulatory Visit: Payer: Self-pay | Admitting: Internal Medicine

## 2018-06-05 DIAGNOSIS — I10 Essential (primary) hypertension: Secondary | ICD-10-CM

## 2018-06-06 ENCOUNTER — Other Ambulatory Visit: Payer: Self-pay

## 2018-06-06 ENCOUNTER — Ambulatory Visit (AMBULATORY_SURGERY_CENTER): Payer: Self-pay | Admitting: *Deleted

## 2018-06-06 ENCOUNTER — Encounter: Payer: Self-pay | Admitting: Gastroenterology

## 2018-06-06 VITALS — Ht 65.0 in | Wt 215.4 lb

## 2018-06-06 DIAGNOSIS — D509 Iron deficiency anemia, unspecified: Secondary | ICD-10-CM

## 2018-06-06 MED ORDER — PEG 3350-KCL-NABCB-NACL-NASULF 236 G PO SOLR: 4000.0000 mL | Freq: Once | ORAL | 0 refills | Status: AC

## 2018-06-06 NOTE — Progress Notes (Signed)
Patient denies any allergies to egg or soy products. Patient denies complications with anesthesia/sedation.  Patient denies oxygen use at home and denies diet medications. Patient does not use CPAP.  Pamphlet given on colonoscopy.

## 2018-06-13 ENCOUNTER — Encounter: Payer: Self-pay | Admitting: Gastroenterology

## 2018-06-13 ENCOUNTER — Ambulatory Visit (AMBULATORY_SURGERY_CENTER): Payer: BLUE CROSS/BLUE SHIELD | Admitting: Gastroenterology

## 2018-06-13 VITALS — BP 127/86 | HR 64 | Temp 98.7°F | Resp 11 | Ht 65.0 in | Wt 215.0 lb

## 2018-06-13 DIAGNOSIS — K635 Polyp of colon: Secondary | ICD-10-CM

## 2018-06-13 DIAGNOSIS — D124 Benign neoplasm of descending colon: Secondary | ICD-10-CM

## 2018-06-13 DIAGNOSIS — K5730 Diverticulosis of large intestine without perforation or abscess without bleeding: Secondary | ICD-10-CM

## 2018-06-13 DIAGNOSIS — D509 Iron deficiency anemia, unspecified: Secondary | ICD-10-CM

## 2018-06-13 LAB — HM COLONOSCOPY

## 2018-06-13 MED ORDER — SODIUM CHLORIDE 0.9 % IV SOLN: 500.0000 mL | Freq: Once | INTRAVENOUS | Status: DC

## 2018-06-13 NOTE — Progress Notes (Signed)
No changes in medical or surgical hx since PV per pt 

## 2018-06-13 NOTE — Patient Instructions (Signed)
HANDOUTS GIVEN FOR POLYPS AND DIVERTICULOSIS  YOU HAD AN ENDOSCOPIC PROCEDURE TODAY AT [facility]:   Refer to the procedure report that was given to you for any specific questions about what was found during the examination.  If the procedure report does not answer your questions, please call your gastroenterologist to clarify.  If you requested that your care partner not be given the details of your procedure findings, then the procedure report has been included in a sealed envelope for you to review at your convenience later.  YOU SHOULD EXPECT: Some feelings of bloating in the abdomen. Passage of more gas than usual.  Walking can help get rid of the air that was put into your GI tract during the procedure and reduce the bloating. If you had a lower endoscopy (such as a colonoscopy or flexible sigmoidoscopy) you may notice spotting of blood in your stool or on the toilet paper. If you underwent a bowel prep for your procedure, you may not have a normal bowel movement for a few days.  Please Note:  You might notice some irritation and congestion in your nose or some drainage.  This is from the oxygen used during your procedure.  There is no need for concern and it should clear up in a day or so.  SYMPTOMS TO REPORT IMMEDIATELY:   Following lower endoscopy (colonoscopy or flexible sigmoidoscopy):  Excessive amounts of blood in the stool  Significant tenderness or worsening of abdominal pains  Swelling of the abdomen that is new, acute  Fever of 100F or higher  For urgent or emergent issues, a gastroenterologist can be reached at any hour by calling [phone].   DIET:  We do recommend a small meal at first, but then you may proceed to your regular diet.  Drink plenty of fluids but you should avoid alcoholic beverages for 24 hours.  ACTIVITY:  You should plan to take it easy for the rest of today and you should NOT DRIVE or use heavy machinery until tomorrow (because of  the sedation medicines used during the test).    FOLLOW UP: Our staff will call the number listed on your records the next business day following your procedure to check on you and address any questions or concerns that you may have regarding the information given to you following your procedure. If we do not reach you, we will leave a message.  However, if you are feeling well and you are not experiencing any problems, there is no need to return our call.  We will assume that you have returned to your regular daily activities without incident.  If any biopsies were taken you will be contacted by phone or by letter within the next 1-3 weeks.  Please call us at [phone] if you have not heard about the biopsies in 3 weeks.    SIGNATURES/CONFIDENTIALITY: You and/or your care partner have signed paperwork which will be entered into your electronic medical record.  These signatures attest to the fact that that the information above on your After Visit Summary has been reviewed and is understood.  Full responsibility of the confidentiality of this discharge information lies with you and/or your care-partner.

## 2018-06-13 NOTE — Op Note (Signed)
[place] Patient Name: Eugene Mccullough Procedure Date: 06/13/2018 3:23 PM MRN: [id number] Endoscopist: Milus Banister , MD Age: 57 Referring MD:  Date of Birth: 1960-10-25 Gender: Male Account #: [id number] Procedure:                Colonoscopy Indications:              Iron deficiency anemia Medicines:                Monitored Anesthesia Care Procedure:                Pre-Anesthesia Assessment:                           - Prior to the procedure, a History and Physical                            was performed, and patient medications and                            allergies were reviewed. The patient's tolerance of                            previous anesthesia was also reviewed. The risks                            and benefits of the procedure and the sedation                            options and risks were discussed with the patient.                            All questions were answered, and informed consent                            was obtained. Prior Anticoagulants: The patient has                            taken no previous anticoagulant or antiplatelet                            agents. ASA Grade Assessment: II - A patient with                            mild systemic disease. After reviewing the risks                            and benefits, the patient was deemed in                            satisfactory condition to undergo the procedure.                           After obtaining informed consent, the colonoscope  was passed under direct vision. Throughout the                            procedure, the patient's blood pressure, pulse, and                            oxygen saturations were monitored continuously. The                            Colonoscope was introduced through the anus and                            advanced to the the cecum, identified by                            appendiceal orifice and ileocecal valve. The                             colonoscopy was performed without difficulty. The                            patient tolerated the procedure well. The quality                            of the bowel preparation was good. The ileocecal                            valve, appendiceal orifice, and rectum were                            photographed. Scope In: 3:33:17 PM Scope Out: 3:44:04 PM Scope Withdrawal Time: 0 hours 7 minutes 27 seconds  Total Procedure Duration: 0 hours 10 minutes 47 seconds  Findings:                 A 1 mm polyp was found in the descending colon. The                            polyp was sessile. The polyp was removed with a                            cold biopsy forceps. Resection and retrieval were                            complete.                           Multiple small and large-mouthed diverticula were                            found in the left colon.                           The exam was otherwise without abnormality on  direct and retroflexion views. Complications:            No immediate complications. Estimated blood loss:                            None. Estimated Blood Loss:     Estimated blood loss: none. Impression:               - One 1 mm polyp in the descending colon, removed                            with a cold biopsy forceps. Resected and retrieved.                           - Diverticulosis in the left colon.                           - The examination was otherwise normal on direct                            and retroflexion views. Recommendation:           - Patient has a contact number available for                            emergencies. The signs and symptoms of potential                            delayed complications were discussed with the                            patient. Return to normal activities tomorrow.                            Written discharge instructions were provided to the                             patient.                           - Resume previous diet.                           - Continue present medications.                           You will receive a letter within 2-3 weeks with the                            pathology results and my final recommendations.                           If the polyp(s) is proven to be 'pre-cancerous' on                            pathology, you will need repeat colonoscopy in 5  years. If the polyp(s) is NOT 'precancerous' on                            pathology then you should repeat colon cancer                            screening in 10 years with colonoscopy without need                            for colon cancer screening by any method prior to                            then (including stool testing). Milus Banister, MD 06/13/2018 3:45:46 PM This report has been signed electronically.

## 2018-06-13 NOTE — Progress Notes (Signed)
To PACU, VSS REPORT TO RN.TB

## 2018-06-13 NOTE — Progress Notes (Signed)
Called to room to assist during endoscopic procedure.  Patient ID and intended procedure confirmed with present staff. Received instructions for my participation in the procedure from the performing physician.  

## 2018-06-16 ENCOUNTER — Telehealth: Payer: Self-pay

## 2018-06-16 NOTE — Telephone Encounter (Signed)
  Follow up Call-  Call back number 06/13/2018 04/02/2018  Post procedure Call Back phone  # [phone]  Permission to leave phone message Yes Yes  Some recent data might be hidden     Patient questions:  Do you have a fever, pain , or abdominal swelling? No. Pain Score  0 *  Have you tolerated food without any problems? Yes.    Have you been able to return to your normal activities? Yes.    Do you have any questions about your discharge instructions: Diet   No. Medications  No. Follow up visit  No.  Do you have questions or concerns about your Care? No.  Actions: * If pain score is 4 or above: No action needed, pain <4.

## 2018-06-19 ENCOUNTER — Encounter: Payer: Self-pay | Admitting: Gastroenterology

## 2018-06-20 ENCOUNTER — Encounter: Payer: BLUE CROSS/BLUE SHIELD | Admitting: Gastroenterology

## 2018-06-24 ENCOUNTER — Other Ambulatory Visit: Payer: Self-pay | Admitting: Internal Medicine

## 2018-06-24 DIAGNOSIS — D508 Other iron deficiency anemias: Secondary | ICD-10-CM

## 2018-07-02 ENCOUNTER — Encounter: Payer: Self-pay | Admitting: *Deleted

## 2018-07-02 ENCOUNTER — Ambulatory Visit: Payer: BLUE CROSS/BLUE SHIELD | Admitting: Internal Medicine

## 2018-07-02 ENCOUNTER — Other Ambulatory Visit (INDEPENDENT_AMBULATORY_CARE_PROVIDER_SITE_OTHER): Payer: BLUE CROSS/BLUE SHIELD

## 2018-07-02 ENCOUNTER — Encounter: Payer: Self-pay | Admitting: Internal Medicine

## 2018-07-02 ENCOUNTER — Other Ambulatory Visit: Payer: Self-pay | Admitting: *Deleted

## 2018-07-02 VITALS — BP 128/78 | HR 83 | Temp 99.1°F | Resp 16 | Ht 65.0 in | Wt 220.2 lb

## 2018-07-02 DIAGNOSIS — M1000 Idiopathic gout, unspecified site: Secondary | ICD-10-CM | POA: Diagnosis not present

## 2018-07-02 DIAGNOSIS — R768 Other specified abnormal immunological findings in serum: Secondary | ICD-10-CM

## 2018-07-02 DIAGNOSIS — I10 Essential (primary) hypertension: Secondary | ICD-10-CM | POA: Diagnosis not present

## 2018-07-02 DIAGNOSIS — Z0000 Encounter for general adult medical examination without abnormal findings: Secondary | ICD-10-CM

## 2018-07-02 DIAGNOSIS — Z0001 Encounter for general adult medical examination with abnormal findings: Secondary | ICD-10-CM

## 2018-07-02 DIAGNOSIS — M2550 Pain in unspecified joint: Secondary | ICD-10-CM

## 2018-07-02 LAB — LDL CHOLESTEROL, DIRECT: LDL DIRECT: 104 mg/dL

## 2018-07-02 LAB — CBC WITH DIFFERENTIAL/PLATELET
BASOS ABS: 0.1 10*3/uL (ref 0.0–0.1)
BASOS PCT: 1.2 % (ref 0.0–3.0)
EOS ABS: 0.2 10*3/uL (ref 0.0–0.7)
Eosinophils Relative: 2.8 % (ref 0.0–5.0)
HCT: 42.1 % (ref 39.0–52.0)
Hemoglobin: 14.6 g/dL (ref 13.0–17.0)
Lymphocytes Relative: 37.1 % (ref 12.0–46.0)
Lymphs Abs: 2 10*3/uL (ref 0.7–4.0)
MCHC: 34.7 g/dL (ref 30.0–36.0)
MCV: 84.3 fl (ref 78.0–100.0)
MONO ABS: 0.6 10*3/uL (ref 0.1–1.0)
Monocytes Relative: 10.8 % (ref 3.0–12.0)
NEUTROS ABS: 2.6 10*3/uL (ref 1.4–7.7)
Neutrophils Relative %: 48.1 % (ref 43.0–77.0)
PLATELETS: 145 10*3/uL — AB (ref 150.0–400.0)
RBC: 4.99 Mil/uL (ref 4.22–5.81)
RDW: 14.4 % (ref 11.5–15.5)
WBC: 5.5 10*3/uL (ref 4.0–10.5)

## 2018-07-02 LAB — LIPID PANEL
CHOL/HDL RATIO: 4
Cholesterol: 151 mg/dL (ref 0–200)
HDL: 35.9 mg/dL — AB (ref >39.00)
NonHDL: 115.32
Triglycerides: 252 mg/dL — ABNORMAL HIGH (ref 0.0–149.0)
VLDL: 50.4 mg/dL — AB (ref 0.0–40.0)

## 2018-07-02 LAB — URINALYSIS, ROUTINE W REFLEX MICROSCOPIC
Bilirubin Urine: NEGATIVE
HGB URINE DIPSTICK: NEGATIVE
Ketones, ur: NEGATIVE
Leukocytes, UA: NEGATIVE
Nitrite: NEGATIVE
PH: 5.5 (ref 5.0–8.0)
RBC / HPF: NONE SEEN (ref 0–?)
Specific Gravity, Urine: >=1.03 — AB (ref 1.000–1.030)
TOTAL PROTEIN, URINE-UPE24: 30 — AB
Urine Glucose: NEGATIVE
Urobilinogen, UA: 0.2 (ref 0.0–1.0)

## 2018-07-02 LAB — TSH: TSH: 0.78 u[IU]/mL (ref 0.35–4.50)

## 2018-07-02 LAB — PSA: PSA: 0.39 ng/mL (ref 0.10–4.00)

## 2018-07-02 LAB — SEDIMENTATION RATE: SED RATE: 24 mm/h — AB (ref 0–20)

## 2018-07-02 LAB — URIC ACID: URIC ACID, SERUM: 6.5 mg/dL (ref 4.0–7.8)

## 2018-07-02 NOTE — Patient Instructions (Signed)

## 2018-07-02 NOTE — Progress Notes (Signed)
Subjective:  Patient ID: Eugene Mccullough, male    DOB: 09-19-1960  Age: 57 y.o. MRN: [id number]  CC: Annual Exam; Hypertension; Hyperlipidemia; and Osteoarthritis   HPI Eugene Mccullough presents for a CPX.  He complains of a several week history of pain in his large joints including his shoulders, elbows, knees, fingers, and ankles.  His most significant discomfort is in the ankles.  He has not noticed any joint redness or swelling.  He is getting symptom relief with over-the-counter doses of Tylenol and Advil.  He denies rash, fever, chills, or myalgias.  He complains of fatigue and weight gain.  He is not currently taking a statin.  Outpatient Medications Prior to Visit  Medication Sig Dispense Refill  . alclomethasone (ACLOVATE) 3.08 % cream 1 APPLICATION TO AFFECTED AREA ONCE A DAY AS NEEDED FOR FACE FOR ECZEMA EXTERNALLY 14 DAYS  0  . amLODipine (NORVASC) 10 MG tablet TAKE 1 TABLET (10 MG TOTAL) BY MOUTH DAILY. 90 tablet 1  . atorvastatin (LIPITOR) 10 MG tablet Take 1 tablet (10 mg total) by mouth daily. 90 tablet 1  . bisacodyl (BISACODYL) 5 MG EC tablet Take 5 mg by mouth daily as needed for moderate constipation. Dulcolax 5 mg tab take as directed for colonoscopy prep.    . cyclobenzaprine (FLEXERIL) 5 MG tablet TAKE 1 TABLET BY MOUTH THREE TIMES A DAY AS NEEDED FOR MUSCLE SPASMS 60 tablet 1  . ferrous sulfate 325 (65 FE) MG tablet TAKE 1 TABLET BY MOUTH 2 TIMES DAILY WITH A MEAL 180 tablet 1  . omeprazole (PRILOSEC) 40 MG capsule TAKE 1 CAPSULE BY MOUTH TWICE A DAY 180 capsule 1  . hydrOXYzine (ATARAX/VISTARIL) 10 MG tablet TAKE 1 TABLET BY MOUTH AT BEDTIME AS DIRECTED  3  . finasteride (PROSCAR) 5 MG tablet Take 1 tablet (5 mg total) by mouth daily. 90 tablet 3   No facility-administered medications prior to visit.     ROS Review of Systems  Constitutional: Positive for fatigue and unexpected weight change. Negative for appetite change, chills, diaphoresis and fever.  HENT:  Negative.   Eyes: Negative.   Respiratory: Negative.  Negative for cough, chest tightness, shortness of breath and wheezing.   Cardiovascular: Negative for chest pain, palpitations and leg swelling.  Gastrointestinal: Negative for abdominal pain, blood in stool, constipation, diarrhea, nausea and vomiting.  Endocrine: Negative.   Genitourinary: Negative.  Negative for difficulty urinating, penile pain, penile swelling, scrotal swelling and testicular pain.  Musculoskeletal: Positive for arthralgias. Negative for back pain, myalgias and neck pain.  Skin: Negative.  Negative for color change, pallor and rash.  Neurological: Negative.  Negative for dizziness, weakness, light-headedness and headaches.  Hematological: Negative for adenopathy. Does not bruise/bleed easily.  Psychiatric/Behavioral: Negative.     Objective:  BP 128/78 (BP Location: Left Arm, Patient Position: Sitting, Cuff Size: Normal)   Pulse 83   Temp 99.1 F (37.3 C) (Oral)   Resp 16   Ht 5\' 5"  (1.651 m)   Wt 220 lb 4 oz (99.9 kg)   SpO2 96%   BMI 36.65 kg/m   BP Readings from Last 3 Encounters:  07/02/18 128/78  06/13/18 127/86  04/02/18 (!) 129/91    Wt Readings from Last 3 Encounters:  07/02/18 220 lb 4 oz (99.9 kg)  06/13/18 215 lb (97.5 kg)  06/06/18 215 lb 6.4 oz (97.7 kg)     Physical Exam  Constitutional: He is oriented to person, place, and time. No distress.  HENT:  Mouth/Throat: Oropharynx is clear and moist. No oropharyngeal exudate.  Eyes: Conjunctivae are normal. No scleral icterus.  Neck: Normal range of motion. Neck supple. No JVD present. No thyromegaly present.  Cardiovascular: Normal rate, regular rhythm and normal heart sounds. Exam reveals no gallop.  No murmur heard. Pulmonary/Chest: Effort normal and breath sounds normal. No respiratory distress. He has no wheezes. He has no rales.  Abdominal: Soft. Bowel sounds are normal. He exhibits no mass. There is no hepatosplenomegaly. There is  no tenderness. Hernia confirmed negative in the right inguinal area and confirmed negative in the left inguinal area.  Genitourinary: Rectum normal, prostate normal, testes normal and penis normal. Rectal exam shows no external hemorrhoid, no internal hemorrhoid, no fissure, no mass, no tenderness, anal tone normal and guaiac negative stool. Prostate is not enlarged and not tender. Right testis shows no mass, no swelling and no tenderness. Left testis shows no mass, no swelling and no tenderness. Circumcised. No penile erythema or penile tenderness. No discharge found.  Musculoskeletal: Normal range of motion. He exhibits no edema, tenderness or deformity.  All of his joints show free range of motion with no tenderness to palpation, swelling, warmth, or crepitance.  Lymphadenopathy:    He has no cervical adenopathy. No inguinal adenopathy noted on the right or left side.  Neurological: He is alert and oriented to person, place, and time.  Skin: Skin is warm and dry. No rash noted. He is not diaphoretic.  Vitals reviewed.   Lab Results  Component Value Date   WBC 5.5 07/02/2018   HGB 14.6 07/02/2018   HCT 42.1 07/02/2018   PLT 145.0 (L) 07/02/2018   GLUCOSE 101 (H) 02/21/2018   CHOL 151 07/02/2018   TRIG 252.0 (H) 07/02/2018   HDL 35.90 (L) 07/02/2018   LDLDIRECT 104.0 07/02/2018   LDLCALC 109 (H) 02/25/2017   ALT 36 02/21/2018   AST 21 02/21/2018   NA 138 02/21/2018   K 3.7 02/21/2018   CL 105 02/21/2018   CREATININE 0.98 02/21/2018   BUN 9 02/21/2018   CO2 25 02/21/2018   TSH 0.78 07/02/2018   PSA 0.39 07/02/2018   HGBA1C 5.9 02/25/2017    Mr Lumbar Spine Wo Contrast  Result Date: 09/22/2017 CLINICAL DATA:  Low back pain with right leg numbness for 6 months. EXAM: MRI LUMBAR SPINE WITHOUT CONTRAST TECHNIQUE: Multiplanar, multisequence MR imaging of the lumbar spine was performed. No intravenous contrast was administered. COMPARISON:  12/24/2012 FINDINGS: Segmentation:   12/24/2012 Alignment:  Normal Vertebrae:  No fracture, evidence of discitis, or bone lesion. Conus medullaris and cauda equina: Conus extends to the L1-2 level. Conus and cauda equina appear normal. Paraspinal and other soft tissues: Left renal cystic intensity. Disc levels: T12- L1: Unremarkable. L1-L2: Unremarkable. L2-L3: Unremarkable. L3-L4: Unremarkable. L4-L5: Mild disc narrowing with left foraminal protrusion that is shallow. Stable mild left foraminal stenosis. Patent spinal canal L5-S1:Significant regression of a right paracentral disc extrusion. A small hypointense protrusion is still seen right paracentral, impinging on the right S1 nerve root. Disc narrowing and desiccation which may have progressed. The foramina are crowded without compression. IMPRESSION: 1. L5-S1 regression of right paracentral disc extrusion compared to 2014. There is still a small right paracentral protrusion compressing the right S1 nerve root. No other explanation for right leg symptoms. 2. L4-5 left foraminal protrusion with mild foraminal stenosis, chronic. Electronically Signed   By: Monte Fantasia M.D.   On: 09/22/2017 18:15    Assessment & Plan:   Clayton Lefort  was seen today for annual exam, hypertension, hyperlipidemia and osteoarthritis.  Diagnoses and all orders for this visit:  Essential hypertension-his blood pressure is well controlled.  Electrolytes and renal function are normal.  We will continue the current dose of amlodipine. -     CBC with Differential/Platelet; Future -     TSH; Future -     Urinalysis, Routine w reflex microscopic; Future  Idiopathic gout, unspecified chronicity, unspecified site-his uric acid level is normal.  I do not think his current symptoms are related to gout. -     Uric acid; Future  Arthralgia, unspecified joint- Screening for inflammatory arthropathies is negative however his Lyme antibodies on Western blot are positive on 3 out of 10.  Current guidelines say Lyme antibodies  are only positive if it is at least 5 out of 10. -     B. burgdorfi antibodies by WB; Future -     Sedimentation rate; Future -     Rheumatoid factor; Future -     Cyclic citrul peptide antibody, IgG; Future -     ANA; Future  Routine general medical examination at a health care facility- Exam completed, labs reviewed, vaccines reviewed and updated, his ASCVD risk score is <15%so I do not recommend a statin for CV risk reduction, colon cancer screening is up-to-date, patient education material was given. -     Lipid panel; Future -     PSA; Future  Borderline results on serologic testing for Lyme disease- I have asked him to see ID to decipher whether or not his current symptoms are related to Lyme disease and whether or not he needs to consider treatment for Lyme disease. -     Ambulatory referral to Infectious Disease   I have discontinued Aryn Akamine's finasteride and hydrOXYzine. I am also having him maintain his atorvastatin, cyclobenzaprine, alclomethasone, amLODipine, bisacodyl, ferrous sulfate, and omeprazole.  No orders of the defined types were placed in this encounter.    Follow-up: Return in about 4 weeks (around 07/30/2018).  Scarlette Calico, MD

## 2018-07-04 LAB — B. BURGDORFI ANTIBODIES BY WB
B BURGDORFERI IGM ABS (IB): NEGATIVE
B burgdorferi IgG Abs (IB): NEGATIVE
LYME DISEASE 23 KD IGM: NONREACTIVE
LYME DISEASE 28 KD IGG: NONREACTIVE
LYME DISEASE 30 KD IGG: NONREACTIVE
LYME DISEASE 39 KD IGG: NONREACTIVE
LYME DISEASE 41 KD IGG: NONREACTIVE
LYME DISEASE 45 KD IGG: NONREACTIVE
LYME DISEASE 93 KD IGG: REACTIVE — AB
Lyme Disease 18 kD IgG: REACTIVE — AB
Lyme Disease 23 kD IgG: REACTIVE — AB
Lyme Disease 39 kD IgM: NONREACTIVE
Lyme Disease 41 kD IgM: NONREACTIVE
Lyme Disease 58 kD IgG: NONREACTIVE
Lyme Disease 66 kD IgG: NONREACTIVE

## 2018-07-04 LAB — RHEUMATOID FACTOR: Rhuematoid fact SerPl-aCnc: <14 IU/mL (ref <14)

## 2018-07-04 LAB — CYCLIC CITRUL PEPTIDE ANTIBODY, IGG: Cyclic Citrullin Peptide Ab: <16 UNITS

## 2018-07-04 LAB — ANA: Anti Nuclear Antibody(ANA): NEGATIVE

## 2018-07-05 ENCOUNTER — Encounter: Payer: Self-pay | Admitting: Internal Medicine

## 2018-07-05 DIAGNOSIS — R768 Other specified abnormal immunological findings in serum: Secondary | ICD-10-CM | POA: Insufficient documentation

## 2018-07-15 ENCOUNTER — Ambulatory Visit: Payer: BLUE CROSS/BLUE SHIELD | Admitting: Infectious Diseases

## 2018-07-15 ENCOUNTER — Encounter: Payer: Self-pay | Admitting: Infectious Diseases

## 2018-07-15 DIAGNOSIS — M2550 Pain in unspecified joint: Secondary | ICD-10-CM

## 2018-07-15 DIAGNOSIS — E669 Obesity, unspecified: Secondary | ICD-10-CM | POA: Diagnosis not present

## 2018-07-15 NOTE — Assessment & Plan Note (Addendum)
I would recommend that he been seen by Rheum.  He has an initial test negative (elisa). He then had Western Blot that was not positive. He does not have definitive dx of Lyme.  He did at one point live in an endemic area, but he did not have illness or rash.  I would not give him doxy.  I explained this to him Available as needed.

## 2018-07-15 NOTE — Assessment & Plan Note (Signed)
He is monitoring his diet. Worried he may develop DM

## 2018-07-15 NOTE — Progress Notes (Signed)
   Subjective:    Patient ID: Eugene Mccullough, male    DOB: 11-26-1960, 57 y.o.   MRN: [id number]  HPI 57 yo M with c/o of large joint (knees, ankles) arthritis for 1 month. He was seen by his PCP and was not found to have joint swelling or tenderness.  He had Lyme Elisa (-) then WB 3/10+ (negative). He also had ANA/CCP and RA (-).  He has no hx of tick bite, no rash aside from his eczema, no travel outside [place] in last 2 years (works between [place] and [place]). Lived in [place] until 1990--2002 when he returned to [place]. He  then returned to [place] in 2011.  He lived in [place] from [id number]. He denied illnesses while living there.   His other hx is notable for L5-S1 disc protrusion seen on MRI. Back pain has been steady. Has RLE numbness from this.   The past medical history, family history and social history were reviewed/updated in EPIC   Review of Systems  Constitutional: Negative for appetite change, chills, fever and unexpected weight change.  Respiratory: Negative for cough and shortness of breath.   Gastrointestinal: Negative for constipation and diarrhea.  Genitourinary: Negative for difficulty urinating.  Skin: Positive for rash.  Neurological: Positive for numbness.       Objective:   Physical Exam  Constitutional: He appears well-developed and well-nourished.  HENT:  Mouth/Throat: No oropharyngeal exudate.  Eyes: Pupils are equal, round, and reactive to light. EOM are normal.  Neck: Normal range of motion. Neck supple.  Cardiovascular: Normal rate, regular rhythm and normal heart sounds.  Pulmonary/Chest: Effort normal and breath sounds normal.  Abdominal: Soft. Bowel sounds are normal. He exhibits no distension. There is no tenderness.  Musculoskeletal: Normal range of motion. He exhibits no edema or tenderness.  Lymphadenopathy:    He has no cervical adenopathy.  Skin: Skin is warm and dry. No rash noted.  Psychiatric: He has a normal mood and affect.      Assessment & Plan:

## 2018-08-01 NOTE — Progress Notes (Signed)
Office Visit Note  Patient: Eugene Mccullough             Date of Birth: 04-22-1961           MRN: [id number]             PCP: Janith Lima, MD Referring: Campbell Riches, MD Visit Date: 08/15/2018 Occupation: Secondary school teacher  Subjective:  Pain in multiple joints.   History of Present Illness: Eugene Mccullough is a 57 y.o. male seen in consultation per request of Dr. Johnnye Sima.  According to patient his symptoms started about 3 months ago with pain in his both wrist, both shoulders and both ankle joints.  He states he is very stiff in the morning which lasts for a few minutes that it eases off.  He initially saw his PCP who suspected that he may have Lyme disease.  He was referred to Dr. Johnnye Sima who evaluated and did not agree with the Lyme disease.  He also had some blood work which was negative.  He states he continues to have pain in all of his joints.  He has had pain in his right thumb CMC joint for many years.  He denies any history of tick bite.  There is no history of any recent illness.  No family history of arthritis or autoimmune disease.  She states that he has had lower back pain for many years.  He was diagnosed with degenerative disc disease.  He was also advised discectomy which she declined.  Continues to have lower back pain.  Activities of Daily Living:  Patient reports morning stiffness for 5 minutes.   Patient Denies nocturnal pain.  Difficulty dressing/grooming: Denies Difficulty climbing stairs: Denies Difficulty getting out of chair: Reports due to lower back discomfort. Difficulty using hands for taps, buttons, cutlery, and/or writing: Denies  Review of Systems  Constitutional: Positive for fatigue. Negative for night sweats.  HENT: Positive for mouth dryness. Negative for mouth sores and nose dryness.   Eyes: Positive for dryness. Negative for redness.  Respiratory: Negative for shortness of breath and difficulty breathing.   Cardiovascular:  Negative for chest pain, palpitations, hypertension, irregular heartbeat and swelling in legs/feet.  Gastrointestinal: Positive for constipation, diarrhea and heartburn.       History of IBS and reflux  Endocrine: Negative for increased urination.  Musculoskeletal: Positive for arthralgias, joint pain and morning stiffness. Negative for joint swelling, myalgias, muscle weakness, muscle tenderness and myalgias.  Skin: Negative for color change, rash, hair loss, nodules/bumps, skin tightness, ulcers and sensitivity to sunlight.  Allergic/Immunologic: Negative for susceptible to infections.  Neurological: Negative for dizziness, fainting, memory loss, night sweats and weakness ( ).  Hematological: Negative for swollen glands.  Psychiatric/Behavioral: Positive for sleep disturbance. Negative for depressed mood. The patient is not nervous/anxious.         sleep apnea    PMFS History:  Patient Active Problem List   Diagnosis Date Noted  . DDD (degenerative disc disease), lumbar 08/15/2018  . Lesion of oral mucosa 08/14/2018  . Generalized abdominal tenderness without rebound tenderness 08/14/2018  . Borderline results on serologic testing for Lyme disease 07/05/2018  . Arthralgia 07/02/2018  . Lumbar radiculopathy 09/12/2017  . PUD (peptic ulcer disease) 10/28/2014  . Allergic rhinitis, cause unspecified 01/15/2014  . Dermatitis, atopic 05/05/2013  . Obstructive sleep apnea 02/02/2013  . Routine general medical examination at a health care facility 01/02/2013  . BPH (benign prostatic hypertrophy) with urinary retention 01/02/2013  . Hypertension  08/15/2011  . Obesity (BMI 30-39.9)     Past Medical History:  Diagnosis Date  . Anemia   . BPH (benign prostatic hyperplasia)   . Eczema   . Gastric ulcer   . GERD (gastroesophageal reflux disease)   . Gout   . H. pylori infection   . Hyperlipidemia   . Hypertension   . IBS (irritable bowel syndrome)   . Sleep apnea    Does not use CPAP     Family History  Problem Relation Age of Onset  . Diabetes Brother   . Hyperlipidemia Mother   . Obesity Sister   . Hypertension Sister   . Diabetes Sister   . Eating disorder Daughter   . Eczema Son   . Irritable bowel syndrome Son   . Cancer Neg Hx   . Early death Neg Hx   . Heart disease Neg Hx   . Kidney disease Neg Hx   . Stroke Neg Hx   . Alcohol abuse Neg Hx   . Colon cancer Neg Hx   . Esophageal cancer Neg Hx   . Stomach cancer Neg Hx   . Rectal cancer Neg Hx    Past Surgical History:  Procedure Laterality Date  . CHOLECYSTECTOMY  2005  . COLONOSCOPY  08/2010  . UPPER GI ENDOSCOPY  2019  . WISDOM TOOTH EXTRACTION     Social History   Social History Narrative  . Not on file    Objective: Vital Signs: BP 136/89 (BP Location: Right Arm, Patient Position: Sitting, Cuff Size: Normal)   Pulse 76   Resp 14   Ht 5\' 7"  (1.702 m)   Wt 221 lb 12.8 oz (100.6 kg)   BMI 34.74 kg/m    Physical Exam Vitals signs and nursing note reviewed.  Constitutional:      Appearance: He is well-developed.  HENT:     Head: Normocephalic and atraumatic.  Eyes:     Conjunctiva/sclera: Conjunctivae normal.     Pupils: Pupils are equal, round, and reactive to light.  Neck:     Musculoskeletal: Normal range of motion and neck supple.  Cardiovascular:     Rate and Rhythm: Normal rate and regular rhythm.     Heart sounds: Normal heart sounds.  Pulmonary:     Effort: Pulmonary effort is normal.     Breath sounds: Normal breath sounds.  Abdominal:     General: Bowel sounds are normal.     Palpations: Abdomen is soft.  Skin:    General: Skin is warm and dry.     Capillary Refill: Capillary refill takes less than 2 seconds.  Neurological:     Mental Status: He is alert and oriented to person, place, and time.  Psychiatric:        Behavior: Behavior normal.      Musculoskeletal Exam: C-spine thoracic spine good range of motion.  He has some discomfort range of motion of  his lumbar spine.  He had good range of motion of shoulder joints with some discomfort.  Elbow joints wrist joints MCPs PIPs DIPs were in good range of motion with no synovitis.  He has some tenderness over right CMC joint.  Hip joints, knee joints, ankle joints, MTPs were in good range of motion.  He has bilateral pes cavus.  He also has some osteoarthritic changes in his toes.  CDAI Exam: CDAI Score: Not documented Patient Global Assessment: Not documented; Provider Global Assessment: Not documented Swollen: Not documented; Tender: Not documented Joint  Exam   Not documented   There is currently no information documented on the homunculus. Go to the Rheumatology activity and complete the homunculus joint exam.  Investigation: Findings:  07/02/18: ANA negative, CCP <16, RF<14, Sed rate 24, Uric acid 6.5  Component     Latest Ref Rng & Units 07/02/2018  Anti Nuclear Antibody(ANA)     NEGATIVE NEGATIVE  PSA     0.10 - 1.61 ng/mL 0.96  Cyclic Citrullin Peptide Ab     UNITS <16  RA Latex Turbid.     <14 IU/mL <14  Sed Rate     0 - 20 mm/hr 24 (H)  Uric Acid, Serum     4.0 - 7.8 mg/dL 6.5   Imaging: No results found.  Recent Labs: Lab Results  Component Value Date   WBC 5.9 08/14/2018   HGB 15.1 08/14/2018   PLT 141.0 (L) 08/14/2018   NA 137 08/14/2018   K 4.7 08/14/2018   CL 102 08/14/2018   CO2 28 08/14/2018   GLUCOSE 90 08/14/2018   BUN 20 08/14/2018   CREATININE 1.16 08/14/2018   BILITOT 0.5 08/14/2018   ALKPHOS 72 08/14/2018   AST 26 08/14/2018   ALT 44 08/14/2018   PROT 8.3 08/14/2018   ALBUMIN 4.4 08/14/2018   CALCIUM 10.1 08/14/2018   GFRAA >90 10/21/2012    Speciality Comments: No specialty comments available.  Procedures:  No procedures performed Allergies: Hydrocodone-acetaminophen   Assessment / Plan:     Visit Diagnoses: Polyarthralgia -patient complains of pain in multiple joints.  He denies any history of swelling.  I do not see any synovitis on  examination.  Autoimmune test so far had been all negative., lyme ab negative"07/02/18: ANA negative, CCP <16, RF<14, Sed rate 24, Uric acid 6.5  Chronic pain of both shoulders -he had discomfort with range of motion of his bilateral shoulders.  No warmth swelling or effusion was noted.  Plan: XR Shoulder Right, XR Shoulder Left.  X-ray of bilateral shoulder joints were unremarkable.  Pain in both hands -he complains of discomfort in his bilateral wrist joints in his hands.  No warmth swelling or effusion was noted.  He had some tenderness on palpation over right CMC joint.  I offered a [place] brace which he already has one.  Use of Voltaren gel was discussed.  Side effects were discussed.  A prescription was given.  Plan: XR Hand 2 View Right, XR Hand 2 View Left.  X-ray of bilateral hands were unremarkable except for mild CMC narrowing in the right hand.  Chronic pain of both ankles -he complains of discomfort in his bilateral ankle joints.  No warmth swelling or effusion was noted.  He has bilateral pes cavus.  He may benefit with arch support in his shoes.  Plan: XR Ankle 2 Views Right, XR Ankle 2 Views Left.  The x-ray of bilateral ankle joints were unremarkable.  DDD (degenerative disc disease), lumbar-he complains of lower back pain.  I reviewed MRI of his lumbar spine.  Which is consistent with degenerative disc disease.  He states he was advised discectomy which she declined.  Have advised him to do some lumbar spine exercises.  Essential hypertension-his blood pressure is controlled today.  Other medical problems are listed as follows:  History of gastroesophageal reflux (GERD)  PUD (peptic ulcer disease)  History of IBS  OSA (obstructive sleep apnea)  History of hyperlipidemia  History of BPH   Orders: Orders Placed This Encounter  Procedures  .  XR Hand 2 View Right  . XR Hand 2 View Left  . XR Shoulder Right  . XR Shoulder Left  . XR Ankle 2 Views Right  . XR Ankle 2 Views  Left   Meds ordered this encounter  Medications  . diclofenac sodium (VOLTAREN) 1 % GEL    Sig: 3 grams to 3 large joints up to 3 times daily    Dispense:  3 Tube    Refill:  3    Face-to-face time spent with patient was 50 minutes. Greater than 50% of time was spent in counseling and coordination of care.  Follow-Up Instructions: Return for polyarthralgia.  Advised patient to return in case he develop any joint swelling.   Bo Merino, MD  Note - This record has been created using Editor, commissioning.  Chart creation errors have been sought, but may not always  have been located. Such creation errors do not reflect on  the standard of medical care.

## 2018-08-14 ENCOUNTER — Ambulatory Visit: Payer: BLUE CROSS/BLUE SHIELD | Admitting: Internal Medicine

## 2018-08-14 ENCOUNTER — Encounter: Payer: Self-pay | Admitting: Internal Medicine

## 2018-08-14 ENCOUNTER — Other Ambulatory Visit (INDEPENDENT_AMBULATORY_CARE_PROVIDER_SITE_OTHER): Payer: BLUE CROSS/BLUE SHIELD

## 2018-08-14 VITALS — BP 126/90 | HR 78 | Temp 98.1°F | Ht 65.0 in | Wt 221.0 lb

## 2018-08-14 DIAGNOSIS — R10817 Generalized abdominal tenderness: Secondary | ICD-10-CM | POA: Diagnosis not present

## 2018-08-14 DIAGNOSIS — K1370 Unspecified lesions of oral mucosa: Secondary | ICD-10-CM | POA: Diagnosis not present

## 2018-08-14 LAB — URINALYSIS, ROUTINE W REFLEX MICROSCOPIC
BILIRUBIN URINE: NEGATIVE
Hgb urine dipstick: NEGATIVE
Ketones, ur: NEGATIVE
Leukocytes, UA: NEGATIVE
NITRITE: NEGATIVE
PH: 5.5 (ref 5.0–8.0)
RBC / HPF: NONE SEEN (ref 0–?)
Specific Gravity, Urine: 1.025 (ref 1.000–1.030)
Total Protein, Urine: 30 — AB
Urine Glucose: NEGATIVE
Urobilinogen, UA: 0.2 (ref 0.0–1.0)

## 2018-08-14 LAB — CBC WITH DIFFERENTIAL/PLATELET
Basophils Absolute: 0.1 10*3/uL (ref 0.0–0.1)
Basophils Relative: 1.4 % (ref 0.0–3.0)
EOS PCT: 2.6 % (ref 0.0–5.0)
Eosinophils Absolute: 0.2 10*3/uL (ref 0.0–0.7)
HEMATOCRIT: 44.5 % (ref 39.0–52.0)
Hemoglobin: 15.1 g/dL (ref 13.0–17.0)
LYMPHS PCT: 37.5 % (ref 12.0–46.0)
Lymphs Abs: 2.2 10*3/uL (ref 0.7–4.0)
MCHC: 33.9 g/dL (ref 30.0–36.0)
MCV: 83.9 fl (ref 78.0–100.0)
Monocytes Absolute: 0.7 10*3/uL (ref 0.1–1.0)
Monocytes Relative: 11.2 % (ref 3.0–12.0)
Neutro Abs: 2.8 10*3/uL (ref 1.4–7.7)
Neutrophils Relative %: 47.3 % (ref 43.0–77.0)
Platelets: 141 10*3/uL — ABNORMAL LOW (ref 150.0–400.0)
RBC: 5.3 Mil/uL (ref 4.22–5.81)
RDW: 14.3 % (ref 11.5–15.5)
WBC: 5.9 10*3/uL (ref 4.0–10.5)

## 2018-08-14 LAB — AMYLASE: Amylase: 69 U/L (ref 27–131)

## 2018-08-14 LAB — COMPREHENSIVE METABOLIC PANEL
ALT: 44 U/L (ref 0–53)
AST: 26 U/L (ref 0–37)
Albumin: 4.4 g/dL (ref 3.5–5.2)
Alkaline Phosphatase: 72 U/L (ref 39–117)
BUN: 20 mg/dL (ref 6–23)
CHLORIDE: 102 meq/L (ref 96–112)
CO2: 28 mEq/L (ref 19–32)
Calcium: 10.1 mg/dL (ref 8.4–10.5)
Creatinine, Ser: 1.16 mg/dL (ref 0.40–1.50)
GFR: 83.19 mL/min (ref >60.00)
Glucose, Bld: 90 mg/dL (ref 70–99)
Potassium: 4.7 mEq/L (ref 3.5–5.1)
Sodium: 137 mEq/L (ref 135–145)
Total Bilirubin: 0.5 mg/dL (ref 0.2–1.2)
Total Protein: 8.3 g/dL (ref 6.0–8.3)

## 2018-08-14 LAB — LIPASE: Lipase: 13 U/L (ref 11.0–59.0)

## 2018-08-14 LAB — SEDIMENTATION RATE: Sed Rate: 24 mm/hr — ABNORMAL HIGH (ref 0–20)

## 2018-08-14 NOTE — Progress Notes (Signed)
Subjective:  Patient ID: Eugene Mccullough, male    DOB: 1961-02-13  Age: 57 y.o. MRN: [id number]  CC: Abdominal Pain   HPI Eugene Mccullough presents for a 5-day history of strange sensation in the middle of his lower abdomen.  He describes the discomfort as being on the external aspect of the abdomen and that there has been some kind of a protrusion that has since resolved.  He denies nausea, vomiting, loss of appetite, dysuria, hematuria, diarrhea, or constipation.  He complains of a lesion in his right upper anterior lip that has been present for 3 months.  He previously chewed tobacco but does not anymore.  Outpatient Medications Prior to Visit  Medication Sig Dispense Refill  . alclomethasone (ACLOVATE) 0.16 % cream 1 APPLICATION TO AFFECTED AREA ONCE A DAY AS NEEDED FOR FACE FOR ECZEMA EXTERNALLY 14 DAYS  0  . amLODipine (NORVASC) 10 MG tablet TAKE 1 TABLET (10 MG TOTAL) BY MOUTH DAILY. 90 tablet 1  . ferrous sulfate 325 (65 FE) MG tablet TAKE 1 TABLET BY MOUTH 2 TIMES DAILY WITH A MEAL 180 tablet 1  . finasteride (PROSCAR) 5 MG tablet Take 5 mg by mouth daily.  8  . hydrOXYzine (ATARAX/VISTARIL) 10 MG tablet   1  . omeprazole (PRILOSEC) 40 MG capsule TAKE 1 CAPSULE BY MOUTH TWICE A DAY 180 capsule 1  . bisacodyl (BISACODYL) 5 MG EC tablet Take 5 mg by mouth daily as needed for moderate constipation. Dulcolax 5 mg tab take as directed for colonoscopy prep.     No facility-administered medications prior to visit.     ROS Review of Systems  Constitutional: Negative for appetite change, diaphoresis, fatigue and unexpected weight change.  HENT: Positive for mouth sores. Negative for sore throat.   Eyes: Negative for visual disturbance.  Respiratory: Negative for cough, chest tightness, shortness of breath and wheezing.   Cardiovascular: Negative for chest pain, palpitations and leg swelling.  Gastrointestinal: Positive for abdominal pain. Negative for constipation, diarrhea, nausea  and vomiting.  Genitourinary: Negative.  Negative for decreased urine volume, difficulty urinating, dysuria, flank pain, hematuria and urgency.  Musculoskeletal: Negative.  Negative for arthralgias and myalgias.  Skin: Negative.   Neurological: Negative.  Negative for dizziness, weakness, light-headedness and numbness.  Hematological: Negative for adenopathy. Does not bruise/bleed easily.  Psychiatric/Behavioral: Negative.     Objective:  BP 126/90 (BP Location: Left Arm, Patient Position: Sitting, Cuff Size: Large)   Pulse 78   Temp 98.1 F (36.7 C) (Oral)   Ht 5\' 5"  (1.651 m)   Wt 221 lb (100.2 kg)   SpO2 98%   BMI 36.78 kg/m   BP Readings from Last 3 Encounters:  08/15/18 136/89  08/14/18 126/90  07/15/18 128/82    Wt Readings from Last 3 Encounters:  08/15/18 221 lb 12.8 oz (100.6 kg)  08/14/18 221 lb (100.2 kg)  07/15/18 218 lb (98.9 kg)    Physical Exam Vitals signs reviewed.  Constitutional:      General: He is not in acute distress.    Appearance: He is well-developed. He is obese. He is not ill-appearing, toxic-appearing or diaphoretic.  HENT:     Mouth/Throat:     Mouth: Mucous membranes are moist.     Comments: Inside the right upper lip over the oral mucosa there is a 0.5 cm pale superficial erosion that is surrounded by hyperpigmented mucosa. Eyes:     General: No scleral icterus. Cardiovascular:     Rate and Rhythm: Normal  rate and regular rhythm.     Heart sounds: Normal heart sounds. No murmur. No gallop.   Pulmonary:     Effort: Pulmonary effort is normal.     Breath sounds: Normal breath sounds. No stridor. No wheezing, rhonchi or rales.  Abdominal:     General: Abdomen is flat. Bowel sounds are normal. There is no distension. There are no signs of injury.     Palpations: Abdomen is soft. There is no hepatomegaly, splenomegaly or mass.     Tenderness: There is no abdominal tenderness. There is no guarding or rebound.     Hernia: No hernia is  present. There is no hernia in the umbilical area, ventral area, right inguinal area, left inguinal area, right femoral area or left femoral area.  Musculoskeletal:        General: No swelling, tenderness or deformity.  Skin:    General: Skin is warm and dry.     Coloration: Skin is not jaundiced.     Findings: No rash.  Neurological:     General: No focal deficit present.     Mental Status: He is oriented to person, place, and time. Mental status is at baseline.     Lab Results  Component Value Date   WBC 5.9 08/14/2018   HGB 15.1 08/14/2018   HCT 44.5 08/14/2018   PLT 141.0 (L) 08/14/2018   GLUCOSE 90 08/14/2018   CHOL 151 07/02/2018   TRIG 252.0 (H) 07/02/2018   HDL 35.90 (L) 07/02/2018   LDLDIRECT 104.0 07/02/2018   LDLCALC 109 (H) 02/25/2017   ALT 44 08/14/2018   AST 26 08/14/2018   NA 137 08/14/2018   K 4.7 08/14/2018   CL 102 08/14/2018   CREATININE 1.16 08/14/2018   BUN 20 08/14/2018   CO2 28 08/14/2018   TSH 0.78 07/02/2018   PSA 0.39 07/02/2018   HGBA1C 5.9 02/25/2017    Mr Lumbar Spine Wo Contrast  Result Date: 09/22/2017 CLINICAL DATA:  Low back pain with right leg numbness for 6 months. EXAM: MRI LUMBAR SPINE WITHOUT CONTRAST TECHNIQUE: Multiplanar, multisequence MR imaging of the lumbar spine was performed. No intravenous contrast was administered. COMPARISON:  12/24/2012 FINDINGS: Segmentation:  12/24/2012 Alignment:  Normal Vertebrae:  No fracture, evidence of discitis, or bone lesion. Conus medullaris and cauda equina: Conus extends to the L1-2 level. Conus and cauda equina appear normal. Paraspinal and other soft tissues: Left renal cystic intensity. Disc levels: T12- L1: Unremarkable. L1-L2: Unremarkable. L2-L3: Unremarkable. L3-L4: Unremarkable. L4-L5: Mild disc narrowing with left foraminal protrusion that is shallow. Stable mild left foraminal stenosis. Patent spinal canal L5-S1:Significant regression of a right paracentral disc extrusion. A small  hypointense protrusion is still seen right paracentral, impinging on the right S1 nerve root. Disc narrowing and desiccation which may have progressed. The foramina are crowded without compression. IMPRESSION: 1. L5-S1 regression of right paracentral disc extrusion compared to 2014. There is still a small right paracentral protrusion compressing the right S1 nerve root. No other explanation for right leg symptoms. 2. L4-5 left foraminal protrusion with mild foraminal stenosis, chronic. Electronically Signed   By: Monte Fantasia M.D.   On: 09/22/2017 18:15    Assessment & Plan:   Eugene Mccullough was seen today for abdominal pain.  Diagnoses and all orders for this visit:  Lesion of oral mucosa- Screening for HIV and syphilis is negative.  I have asked him to see oral surgery to consider having this area biopsied to screen for leukoplakia and malignancy. -  RPR; Future -     Ambulatory referral to Oral Maxillofacial Surgery -     HIV antibody (with reflex)  Generalized abdominal tenderness without rebound tenderness- His symptoms are benign and resolving.  Examination of the area is normal.  His labs are negative for any secondary or metabolic causes.  I have offered him reassurance.  If he has lingering symptoms he will let me know and we will consider doing a CT scan of the area. -     CBC with Differential/Platelet; Future -     Amylase; Future -     Lipase; Future -     Comprehensive metabolic panel; Future -     Urinalysis, Routine w reflex microscopic; Future -     Sedimentation rate; Future   I have discontinued Rogen Maron's bisacodyl. I am also having him maintain his alclomethasone, amLODipine, ferrous sulfate, omeprazole, hydrOXYzine, and finasteride.  No orders of the defined types were placed in this encounter.    Follow-up: Return in about 2 weeks (around 08/28/2018).  Scarlette Calico, MD

## 2018-08-14 NOTE — Patient Instructions (Signed)
Abdominal Pain, Adult  Abdominal pain can be caused by many things. Often, abdominal pain is not serious and it gets better with no treatment or by being treated at home. However, sometimes abdominal pain is serious. Your health care provider will do a medical history and a physical exam to try to determine the cause of your abdominal pain.  Follow these instructions at home:   Take over-the-counter and prescription medicines only as told by your health care provider. Do not take a laxative unless told by your health care provider.   Drink enough fluid to keep your urine clear or pale yellow.   Watch your condition for any changes.   Keep all follow-up visits as told by your health care provider. This is important.  Contact a health care provider if:   Your abdominal pain changes or gets worse.   You are not hungry or you lose weight without trying.   You are constipated or have diarrhea for more than 2-3 days.   You have pain when you urinate or have a bowel movement.   Your abdominal pain wakes you up at night.   Your pain gets worse with meals, after eating, or with certain foods.   You are throwing up and cannot keep anything down.   You have a fever.  Get help right away if:   Your pain does not go away as soon as your health care provider told you to expect.   You cannot stop throwing up.   Your pain is only in areas of the abdomen, such as the right side or the left lower portion of the abdomen.   You have bloody or black stools, or stools that look like tar.   You have severe pain, cramping, or bloating in your abdomen.   You have signs of dehydration, such as:  ? Dark urine, very little urine, or no urine.  ? Cracked lips.  ? Dry mouth.  ? Sunken eyes.  ? Sleepiness.  ? Weakness.  This information is not intended to replace advice given to you by your health care provider. Make sure you discuss any questions you have with your health care provider.  Document Released: 05/23/2005 Document  Revised: 03/02/2016 Document Reviewed: 01/25/2016  [employer]vier Interactive Patient Education  2019 [employer]

## 2018-08-15 ENCOUNTER — Encounter: Payer: Self-pay | Admitting: Rheumatology

## 2018-08-15 ENCOUNTER — Ambulatory Visit (INDEPENDENT_AMBULATORY_CARE_PROVIDER_SITE_OTHER): Payer: BLUE CROSS/BLUE SHIELD

## 2018-08-15 ENCOUNTER — Ambulatory Visit: Payer: BLUE CROSS/BLUE SHIELD | Admitting: Rheumatology

## 2018-08-15 ENCOUNTER — Ambulatory Visit (INDEPENDENT_AMBULATORY_CARE_PROVIDER_SITE_OTHER): Payer: Self-pay

## 2018-08-15 ENCOUNTER — Encounter: Payer: Self-pay | Admitting: Internal Medicine

## 2018-08-15 VITALS — BP 136/89 | HR 76 | Resp 14 | Ht 67.0 in | Wt 221.8 lb

## 2018-08-15 DIAGNOSIS — M25511 Pain in right shoulder: Secondary | ICD-10-CM | POA: Diagnosis not present

## 2018-08-15 DIAGNOSIS — G4733 Obstructive sleep apnea (adult) (pediatric): Secondary | ICD-10-CM

## 2018-08-15 DIAGNOSIS — G8929 Other chronic pain: Secondary | ICD-10-CM | POA: Diagnosis not present

## 2018-08-15 DIAGNOSIS — K279 Peptic ulcer, site unspecified, unspecified as acute or chronic, without hemorrhage or perforation: Secondary | ICD-10-CM

## 2018-08-15 DIAGNOSIS — Z8719 Personal history of other diseases of the digestive system: Secondary | ICD-10-CM

## 2018-08-15 DIAGNOSIS — M25542 Pain in joints of left hand: Secondary | ICD-10-CM

## 2018-08-15 DIAGNOSIS — M2550 Pain in unspecified joint: Secondary | ICD-10-CM | POA: Diagnosis not present

## 2018-08-15 DIAGNOSIS — M5136 Other intervertebral disc degeneration, lumbar region: Secondary | ICD-10-CM

## 2018-08-15 DIAGNOSIS — M79641 Pain in right hand: Secondary | ICD-10-CM | POA: Diagnosis not present

## 2018-08-15 DIAGNOSIS — Z8639 Personal history of other endocrine, nutritional and metabolic disease: Secondary | ICD-10-CM

## 2018-08-15 DIAGNOSIS — Z87438 Personal history of other diseases of male genital organs: Secondary | ICD-10-CM

## 2018-08-15 DIAGNOSIS — Q667 Congenital pes cavus, unspecified foot: Secondary | ICD-10-CM

## 2018-08-15 DIAGNOSIS — M25512 Pain in left shoulder: Secondary | ICD-10-CM

## 2018-08-15 DIAGNOSIS — M25571 Pain in right ankle and joints of right foot: Secondary | ICD-10-CM | POA: Diagnosis not present

## 2018-08-15 DIAGNOSIS — M25541 Pain in joints of right hand: Secondary | ICD-10-CM

## 2018-08-15 DIAGNOSIS — M79642 Pain in left hand: Secondary | ICD-10-CM

## 2018-08-15 DIAGNOSIS — I10 Essential (primary) hypertension: Secondary | ICD-10-CM

## 2018-08-15 DIAGNOSIS — M25572 Pain in left ankle and joints of left foot: Secondary | ICD-10-CM

## 2018-08-15 LAB — RPR: RPR Ser Ql: NONREACTIVE

## 2018-08-15 MED ORDER — DICLOFENAC SODIUM 1 % TD GEL: TRANSDERMAL | 3 refills | Status: DC

## 2018-08-15 NOTE — Patient Instructions (Signed)
Shoulder Exercises Ask your health care provider which exercises are safe for you. Do exercises exactly as told by your health care provider and adjust them as directed. It is normal to feel mild stretching, pulling, tightness, or discomfort as you do these exercises, but you should stop right away if you feel sudden pain or your pain gets worse.Do not begin these exercises until told by your health care provider. Range of Motion Exercises        These exercises warm up your muscles and joints and improve the movement and flexibility of your shoulder. These exercises also help to relieve pain, numbness, and tingling. These exercises involve stretching your injured shoulder directly. Exercise A: Pendulum 1. Stand near a wall or a surface that you can hold onto for balance. 2. Bend at the waist and let your left / right arm hang straight down. Use your other arm to support you. Keep your back straight and do not lock your knees. 3. Relax your left / right arm and shoulder muscles, and move your hips and your trunk so your left / right arm swings freely. Your arm should swing because of the motion of your body, not because you are using your arm or shoulder muscles. 4. Keep moving your body so your arm swings in the following directions, as told by your health care provider: ? Side to side. ? Forward and backward. ? In clockwise and counterclockwise circles. 5. Continue each motion for __________ seconds, or for as long as told by your health care provider. 6. Slowly return to the starting position. Repeat __________ times. Complete this exercise __________ times a day. Exercise B:Flexion, Standing 1. Stand and hold a broomstick, a cane, or a similar object. Place your hands a little more than shoulder-width apart on the object. Your left / right hand should be palm-up, and your other hand should be palm-down. 2. Keep your elbow straight and keep your shoulder muscles relaxed. Push the stick  down with your healthy arm to raise your left / right arm in front of your body, and then over your head until you feel a stretch in your shoulder. ? Avoid shrugging your shoulder while you raise your arm. Keep your shoulder blade tucked down toward the middle of your back. 3. Hold for __________ seconds. 4. Slowly return to the starting position. Repeat __________ times. Complete this exercise __________ times a day. Exercise C: Abduction, Standing 1. Stand and hold a broomstick, a cane, or a similar object. Place your hands a little more than shoulder-width apart on the object. Your left / right hand should be palm-up, and your other hand should be palm-down. 2. While keeping your elbow straight and your shoulder muscles relaxed, push the stick across your body toward your left / right side. Raise your left / right arm to the side of your body and then over your head until you feel a stretch in your shoulder. ? Do not raise your arm above shoulder height, unless your health care provider tells you to do that. ? Avoid shrugging your shoulder while you raise your arm. Keep your shoulder blade tucked down toward the middle of your back. 3. Hold for __________ seconds. 4. Slowly return to the starting position. Repeat __________ times. Complete this exercise __________ times a day. Exercise D:Internal Rotation 1. Place your left / right hand behind your back, palm-up. 2. Use your other hand to dangle an exercise band, a towel, or a similar object over your shoulder.   Grasp the band with your left / right hand so you are holding onto both ends. 3. Gently pull up on the band until you feel a stretch in the front of your left / right shoulder. ? Avoid shrugging your shoulder while you raise your arm. Keep your shoulder blade tucked down toward the middle of your back. 4. Hold for __________ seconds. 5. Release the stretch by letting go of the band and lowering your hands. Repeat __________ times.  Complete this exercise __________ times a day. Stretching Exercises  These exercises warm up your muscles and joints and improve the movement and flexibility of your shoulder. These exercises also help to relieve pain, numbness, and tingling. These exercises are done using your healthy shoulder to help stretch the muscles of your injured shoulder. Exercise E: Corner Stretch (External Rotation and Abduction) 1. Stand in a doorway with one of your feet slightly in front of the other. This is called a staggered stance. If you cannot reach your forearms to the door frame, stand facing a corner of a room. 2. Choose one of the following positions as told by your health care provider: ? Place your hands and forearms on the door frame above your head. ? Place your hands and forearms on the door frame at the height of your head. ? Place your hands on the door frame at the height of your elbows. 3. Slowly move your weight onto your front foot until you feel a stretch across your chest and in the front of your shoulders. Keep your head and chest upright and keep your abdominal muscles tight. 4. Hold for __________ seconds. 5. To release the stretch, shift your weight to your back foot. Repeat __________ times. Complete this stretch __________ times a day. Exercise F:Extension, Standing 1. Stand and hold a broomstick, a cane, or a similar object behind your back. ? Your hands should be a little wider than shoulder-width apart. ? Your palms should face away from your back. 2. Keeping your elbows straight and keeping your shoulder muscles relaxed, move the stick away from your body until you feel a stretch in your shoulder. ? Avoid shrugging your shoulders while you move the stick. Keep your shoulder blade tucked down toward the middle of your back. 3. Hold for __________ seconds. 4. Slowly return to the starting position. Repeat __________ times. Complete this exercise __________ times a  day. Strengthening Exercises           These exercises build strength and endurance in your shoulder. Endurance is the ability to use your muscles for a long time, even after they get tired. Exercise G:External Rotation 1. Sit in a stable chair without armrests. 2. Secure an exercise band at elbow height on your left / right side. 3. Place a soft object, such as a folded towel or a small pillow, between your left / right upper arm and your body to move your elbow a few inches away (about 10 cm) from your side. 4. Hold the end of the band so it is tight and there is no slack. 5. Keeping your elbow pressed against the soft object, move your left / right forearm out, away from your abdomen. Keep your body steady so only your forearm moves. 6. Hold for __________ seconds. 7. Slowly return to the starting position. Repeat __________ times. Complete this exercise __________ times a day. Exercise H:Shoulder Abduction 1. Sit in a stable chair without armrests, or stand. 2. Hold a __________ weight in your   left / right hand, or hold an exercise band with both hands. 3. Start with your arms straight down and your left / right palm facing in, toward your body. 4. Slowly lift your left / right hand out to your side. Do not lift your hand above shoulder height unless your health care provider tells you that this is safe. ? Keep your arms straight. ? Avoid shrugging your shoulder while you do this movement. Keep your shoulder blade tucked down toward the middle of your back. 5. Hold for __________ seconds. 6. Slowly lower your arm, and return to the starting position. Repeat __________ times. Complete this exercise __________ times a day. Exercise I:Shoulder Extension 1. Sit in a stable chair without armrests, or stand. 2. Secure an exercise band to a stable object in front of you where it is at shoulder height. 3. Hold one end of the exercise band in each hand. Your palms should face each  other. 4. Straighten your elbows and lift your hands up to shoulder height. 5. Step back, away from the secured end of the exercise band, until the band is tight and there is no slack. 6. Squeeze your shoulder blades together as you pull your hands down to the sides of your thighs. Stop when your hands are straight down by your sides. Do not let your hands go behind your body. 7. Hold for __________ seconds. 8. Slowly return to the starting position. Repeat __________ times. Complete this exercise __________ times a day. Exercise J:Standing Shoulder Row 1. Sit in a stable chair without armrests, or stand. 2. Secure an exercise band to a stable object in front of you so it is at waist height. 3. Hold one end of the exercise band in each hand. Your palms should be in a thumbs-up position. 4. Bend each of your elbows to an "L" shape (about 90 degrees) and keep your upper arms at your sides. 5. Step back until the band is tight and there is no slack. 6. Slowly pull your elbows back behind you. 7. Hold for __________ seconds. 8. Slowly return to the starting position. Repeat __________ times. Complete this exercise __________ times a day. Exercise K:Shoulder Press-Ups 1. Sit in a stable chair that has armrests. Sit upright, with your feet flat on the floor. 2. Put your hands on the armrests so your elbows are bent and your fingers are pointing forward. Your hands should be about even with the sides of your body. 3. Push down on the armrests and use your arms to lift yourself off of the chair. Straighten your elbows and lift yourself up as much as you comfortably can. ? Move your shoulder blades down, and avoid letting your shoulders move up toward your ears. ? Keep your feet on the ground. As you get stronger, your feet should support less of your body weight as you lift yourself up. 4. Hold for __________ seconds. 5. Slowly lower yourself back into the chair. Repeat __________ times. Complete  this exercise __________ times a day. Exercise L: Wall Push-Ups 1. Stand so you are facing a stable wall. Your feet should be about one arm-length away from the wall. 2. Lean forward and place your palms on the wall at shoulder height. 3. Keep your feet flat on the floor as you bend your elbows and lean forward toward the wall. 4. Hold for __________ seconds. 5. Straighten your elbows to push yourself back to the starting position. Repeat __________ times. Complete this exercise __________ times   a day. This information is not intended to replace advice given to you by your health care provider. Make sure you discuss any questions you have with your health care provider. Document Released: 06/27/2005 Document Revised: 12/17/2017 Document Reviewed: 04/24/2015 [employer] Interactive Patient Education  2019 [employer]  

## 2018-08-24 ENCOUNTER — Other Ambulatory Visit: Payer: Self-pay | Admitting: Internal Medicine

## 2018-08-24 DIAGNOSIS — I10 Essential (primary) hypertension: Secondary | ICD-10-CM

## 2018-09-16 ENCOUNTER — Ambulatory Visit: Payer: Self-pay | Admitting: Rheumatology

## 2018-11-21 ENCOUNTER — Encounter: Payer: Self-pay | Admitting: Internal Medicine

## 2018-11-22 ENCOUNTER — Other Ambulatory Visit: Payer: Self-pay | Admitting: Internal Medicine

## 2018-11-22 DIAGNOSIS — I10 Essential (primary) hypertension: Secondary | ICD-10-CM

## 2018-11-22 DIAGNOSIS — K279 Peptic ulcer, site unspecified, unspecified as acute or chronic, without hemorrhage or perforation: Secondary | ICD-10-CM

## 2018-11-22 DIAGNOSIS — D508 Other iron deficiency anemias: Secondary | ICD-10-CM

## 2018-11-22 MED ORDER — FERROUS SULFATE 325 (65 FE) MG PO TABS: ORAL_TABLET | ORAL | 1 refills | Status: DC

## 2018-11-22 MED ORDER — OMEPRAZOLE 40 MG PO CPDR: 40.0000 mg | DELAYED_RELEASE_CAPSULE | Freq: Two times a day (BID) | ORAL | 1 refills | Status: DC

## 2018-11-22 MED ORDER — AMLODIPINE BESYLATE 10 MG PO TABS: 10.0000 mg | ORAL_TABLET | Freq: Every day | ORAL | 1 refills | Status: DC

## 2018-11-22 MED ORDER — FINASTERIDE 5 MG PO TABS: 5.0000 mg | ORAL_TABLET | Freq: Every day | ORAL | 1 refills | Status: DC

## 2019-06-25 ENCOUNTER — Other Ambulatory Visit: Payer: Self-pay | Admitting: Internal Medicine

## 2019-06-25 DIAGNOSIS — I10 Essential (primary) hypertension: Secondary | ICD-10-CM

## 2019-11-12 ENCOUNTER — Ambulatory Visit: Payer: BLUE CROSS/BLUE SHIELD | Admitting: Internal Medicine

## 2019-12-21 ENCOUNTER — Other Ambulatory Visit: Payer: Self-pay | Admitting: Internal Medicine

## 2019-12-21 DIAGNOSIS — I10 Essential (primary) hypertension: Secondary | ICD-10-CM

## 2020-11-25 ENCOUNTER — Other Ambulatory Visit: Payer: Self-pay

## 2020-11-25 ENCOUNTER — Encounter: Payer: Self-pay | Admitting: Cardiology

## 2020-11-25 ENCOUNTER — Ambulatory Visit: Payer: BLUE CROSS/BLUE SHIELD | Admitting: Cardiology

## 2020-11-25 VITALS — BP 127/90 | HR 105 | Temp 98.2°F | Resp 16 | Ht 67.0 in | Wt 211.4 lb

## 2020-11-25 DIAGNOSIS — I2510 Atherosclerotic heart disease of native coronary artery without angina pectoris: Secondary | ICD-10-CM

## 2020-11-25 DIAGNOSIS — I10 Essential (primary) hypertension: Secondary | ICD-10-CM

## 2020-11-25 DIAGNOSIS — R072 Precordial pain: Secondary | ICD-10-CM

## 2020-11-25 DIAGNOSIS — Z87891 Personal history of nicotine dependence: Secondary | ICD-10-CM

## 2020-11-25 DIAGNOSIS — E782 Mixed hyperlipidemia: Secondary | ICD-10-CM

## 2020-11-25 MED ORDER — NITROGLYCERIN 0.4 MG SL SUBL: 0.4000 mg | SUBLINGUAL_TABLET | SUBLINGUAL | 0 refills | Status: DC | PRN

## 2020-11-25 MED ORDER — ASPIRIN EC 81 MG PO TBEC: 81.0000 mg | DELAYED_RELEASE_TABLET | Freq: Every day | ORAL | 3 refills | Status: DC

## 2020-11-25 MED ORDER — METOPROLOL TARTRATE 50 MG PO TABS: 50.0000 mg | ORAL_TABLET | Freq: Two times a day (BID) | ORAL | 0 refills | Status: DC

## 2020-11-25 NOTE — Progress Notes (Signed)
Date:  11/25/2020   ID:  Eugene Mccullough, DOB Jan 10, 1961, MRN [id number]  PCP:  Malena Peer, MD  Cardiologist:  Rex Kras, DO, [facility] (established care 11/25/2020) Former Cardiology Providers: Dr. Lyman Bishop   REASON FOR CONSULT: Chest Pain   REQUESTING PHYSICIAN:  [name], [name], MD [place] [employer] [address]  Chief Complaint  Patient presents with  . Chest pain   . New Patient (Initial Visit)    HPI  Deonta Bomberger is a 60 y.o. African-American male who presents to the office with a chief complaint of " chest pain." Patient's past medical history and cardiovascular risk factors include: HTN, HLD, BPH, mild coronary artery calcification (4 AU 05/2017), 26-pack-year history of smoking, quit in 2014, obesity due to excess calories.  He is referred to the office at the request of [name], [name], MD for evaluation of chest pain.  Chest Pain:  Patient states that he first had chest pain approximately 3 years ago at which time he underwent cardiovascular testing which was noted to be overall unremarkable.  Patient states that he continues to have substernal chest discomfort of similar intensity and frequency as he had 3 years ago.  The symptoms are brought on by effort related activities and does resolve with rest.  The substernal chest pain is described as a squeezing-like sensation, 8 out of 10, nonradiating, worse with activities such as mowing the grass and walking, and gets better with resting.  Patient states that the intensity, frequency, and duration has not changed compared to his discomfort 3 years ago.  Calcium score report, nuclear stress test report reviewed findings noted below for further reference.  He does not have any active chest pain. He is overall euvolemic and not in congestive heart failure.  No family history of premature coronary disease or sudden cardiac death.  FUNCTIONAL STATUS: No structured exercise program but tries to keep himself  active with washing cars, taking care of his yard, and doing work around the house.  ALLERGIES: Allergies  Allergen Reactions  . Hydrocodone-Acetaminophen Itching    MEDICATION LIST PRIOR TO VISIT: Current Meds  Medication Sig  . alclomethasone (ACLOVATE) 5.78 % cream 1 APPLICATION TO AFFECTED AREA ONCE A DAY AS NEEDED FOR FACE FOR ECZEMA EXTERNALLY 14 DAYS  . amLODipine (NORVASC) 10 MG tablet TAKE 1 TABLET BY MOUTH EVERY DAY  . aspirin EC 81 MG tablet Take 1 tablet (81 mg total) by mouth daily. Swallow whole.  Marland Kitchen atorvastatin (LIPITOR) 10 MG tablet Take 10 mg by mouth at bedtime.  . diclofenac sodium (VOLTAREN) 1 % GEL 3 grams to 3 large joints up to 3 times daily  . finasteride (PROSCAR) 5 MG tablet TAKE 1 TABLET BY MOUTH EVERY DAY  . hydrOXYzine (ATARAX/VISTARIL) 10 MG tablet   . nitroGLYCERIN (NITROSTAT) 0.4 MG SL tablet Place 1 tablet (0.4 mg total) under the tongue every 5 (five) minutes as needed for chest pain. If you require more than two tablets five minutes apart go to the nearest ER via EMS.  Marland Kitchen omeprazole (PRILOSEC) 40 MG capsule Take 1 capsule (40 mg total) by mouth 2 (two) times daily.     PAST MEDICAL HISTORY: Past Medical History:  Diagnosis Date  . Anemia   . BPH (benign prostatic hyperplasia)   . Eczema   . Gastric ulcer   . GERD (gastroesophageal reflux disease)   . Gout   . H. pylori infection   . Hyperlipidemia   . Hypertension   .  IBS (irritable bowel syndrome)   . Sleep apnea    Does not use CPAP    PAST SURGICAL HISTORY: Past Surgical History:  Procedure Laterality Date  . CHOLECYSTECTOMY  2005  . COLONOSCOPY  08/2010  . UPPER GI ENDOSCOPY  2019  . WISDOM TOOTH EXTRACTION      FAMILY HISTORY: The patient family history includes Diabetes in his brother and sister; Eating disorder in his daughter; Eczema in his son; Hyperlipidemia in his mother; Hypertension in his sister; Irritable bowel syndrome in his son; Obesity in his sister.  SOCIAL  HISTORY:  The patient  reports that he has quit smoking. His smoking use included cigarettes. He has a 20.00 pack-year smoking history. He has never used smokeless tobacco. He reports that he does not drink alcohol and does not use drugs.  REVIEW OF SYSTEMS: Review of Systems  Constitutional: Negative for chills and fever.  HENT: Negative for hoarse voice and nosebleeds.   Eyes: Negative for discharge, double vision and pain.  Cardiovascular: Positive for chest pain. Negative for claudication, dyspnea on exertion, leg swelling, near-syncope, orthopnea, palpitations, paroxysmal nocturnal dyspnea and syncope.  Respiratory: Negative for hemoptysis and shortness of breath.   Musculoskeletal: Negative for muscle cramps and myalgias.  Gastrointestinal: Positive for heartburn. Negative for abdominal pain, constipation, diarrhea, hematemesis, hematochezia, melena, nausea and vomiting.  Neurological: Negative for dizziness and light-headedness.    PHYSICAL EXAM: Vitals with BMI 11/25/2020 08/15/2018 08/15/2018  Height '5\' 7"'  - '5\' 7"'   Weight 211 lbs 6 oz - 221 lbs 13 oz  BMI 49.4 - 49.67  Systolic 591 638 466  Diastolic 90 89 85  Pulse 599 76 78    CONSTITUTIONAL: Well-developed and well-nourished. No acute distress.  SKIN: Skin is warm and dry. No rash noted. No cyanosis. No pallor. No jaundice HEAD: Normocephalic and atraumatic.  EYES: No scleral icterus MOUTH/THROAT: Moist oral membranes.  NECK: No JVD present. No thyromegaly noted. No carotid bruits  LYMPHATIC: No visible cervical adenopathy.  CHEST Normal respiratory effort. No intercostal retractions  LUNGS: Clear to auscultation bilaterally.  No stridor. No wheezes. No rales.  CARDIOVASCULAR: Regular, positive S1-S2, no murmurs rubs or gallops appreciated ABDOMINAL: No apparent ascites.  EXTREMITIES: No peripheral edema  HEMATOLOGIC: No significant bruising NEUROLOGIC: Oriented to person, place, and time. Nonfocal. Normal muscle tone.   PSYCHIATRIC: Normal mood and affect. Normal behavior. Cooperative  CARDIAC DATABASE: EKG: 11/25/2020: Sinus tachycardia, 101 bpm, left axis deviation, poor R wave progression, old inferior infarct.    Echocardiogram: No results found for this or any previous visit from the past 1095 days.   Stress Testing: Nuclear stress test 05/01/2017:  The left ventricular ejection fraction is normal (55-65%).  Nuclear stress EF: 59%.  Blood pressure demonstrated a normal response to exercise.  There was no ST segment deviation noted during stress.  Defect 1: There is a small defect of moderate severity present in the basal inferior location.  This is a low risk study.   Low risk stress nuclear study with probable inferobasal thinning and no significant ischemia; EF 59 with normal wall motion.  Heart Catheterization: None  LABORATORY DATA: CBC Latest Ref Rng & Units 08/14/2018 07/02/2018 02/21/2018  WBC 4.0 - 10.5 K/uL 5.9 5.5 5.7  Hemoglobin 13.0 - 17.0 g/dL 15.1 14.6 14.0  Hematocrit 39.0 - 52.0 % 44.5 42.1 40.5  Platelets 150.0 - 400.0 K/uL 141.0(L) 145.0(L) 161.0    CMP Latest Ref Rng & Units 08/14/2018 02/21/2018 11/28/2017  Glucose 70 -  99 mg/dL 90 101(H) 110(H)  BUN 6 - 23 mg/dL '20 9 16  ' Creatinine 0.40 - 1.50 mg/dL 1.16 0.98 1.18  Sodium 135 - 145 mEq/L 137 138 137  Potassium 3.5 - 5.1 mEq/L 4.7 3.7 4.5  Chloride 96 - 112 mEq/L 102 105 104  CO2 19 - 32 mEq/L '28 25 28  ' Calcium 8.4 - 10.5 mg/dL 10.1 9.4 9.8  Total Protein 6.0 - 8.3 g/dL 8.3 7.8 -  Total Bilirubin 0.2 - 1.2 mg/dL 0.5 0.5 -  Alkaline Phos 39 - 117 U/L 72 69 -  AST 0 - 37 U/L 26 21 -  ALT 0 - 53 U/L 44 36 -    Lipid Panel  Lab Results  Component Value Date   CHOL 151 07/02/2018   HDL 35.90 (L) 07/02/2018   LDLCALC 109 (H) 02/25/2017   LDLDIRECT 104.0 07/02/2018   TRIG 252.0 (H) 07/02/2018   CHOLHDL 4 07/02/2018    No components found for: NTPROBNP No results for input(s): PROBNP in the last 8760  hours. No results for input(s): TSH in the last 8760 hours.  BMP No results for input(s): NA, K, CL, CO2, GLUCOSE, BUN, CREATININE, CALCIUM, GFRNONAA, GFRAA in the last 8760 hours.  HEMOGLOBIN A1C Lab Results  Component Value Date   HGBA1C 5.9 02/25/2017   External Labs: Collected: 11/09/2020, received from PCP Hemoglobin 15.8 g/dL, hematocrit 48% Platelets 143 Creatinine 0.93 mg/dL. eGFR: 103 mL/min per 1.73 m Potassium 4.9 AST 23, ALT 40, alkaline phosphatase 108 Lipid profile: Total cholesterol 191, triglycerides 91, HDL 47, LDL 125, non-HDL 144 Hemoglobin A1c: 5.3 TSH: 1.07  IMPRESSION:    ICD-10-CM   1. Precordial pain  R07.2 EKG 12-Lead    metoprolol tartrate (LOPRESSOR) 50 MG tablet    aspirin EC 81 MG tablet    PCV ECHOCARDIOGRAM COMPLETE    CT CORONARY MORPH W/CTA COR W/SCORE W/CA W/CM &/OR WO/CM    CT CORONARY FRACTIONAL FLOW RESERVE DATA PREP    CT CORONARY FRACTIONAL FLOW RESERVE FLUID ANALYSIS    nitroGLYCERIN (NITROSTAT) 0.4 MG SL tablet  2. Essential hypertension  I10 metoprolol tartrate (LOPRESSOR) 50 MG tablet  3. Coronary artery calcification seen on computed tomography  I25.10   4. Mixed hyperlipidemia  E78.2   5. Former smoker  Z87.891      RECOMMENDATIONS: Abdul Beirne is a 60 y.o. male whose past medical history and cardiac risk factors include:  HTN, HLD, BPH, mild coronary artery calcification (4 AU 05/2017), 26-pack-year history of smoking, quit in 2014, obesity due to excess calories.  Precordial chest pain:  Patient symptoms of chest discomfort appears to be cardiac in etiology.  However, the intensity, frequency, and duration is similar to what his symptoms were approximately 3 years ago during that evaluation he had undergone a coronary artery calcium score which noted mild CAC and nuclear stress test overall reported to be a low risk study.  The shared decision was to proceed with coronary CTA to evaluate for CAD.  Start Lopressor 50  mg p.o. twice daily  Start aspirin 81 mg p.o. daily until the work-up is complete.  Prescribe sublingual nitroglycerin tablet on as needed basis.  EKG today shows normal sinus rhythm without underlying injury pattern.  Echocardiogram will be ordered to evaluate for structural heart disease and left ventricular systolic function.  Benign essential hypertension:  Medications reconciled.  Patient is asked to keep a log of his blood pressures and to review it with either his PCP or myself  at the next office visit.  Low-salt diet recommended.  We will start Lopressor 50 mg p.o. twice daily for now.  Mixed hyperlipidemia:  His statin dose was recently increased to 20 mg p.o. nightly, which I agree with for now.  Educated on importance of dietary restrictions.  Does not endorse any myalgias.  Former smoker: Educated on the importance of continued smoking cessation.  FINAL MEDICATION LIST END OF ENCOUNTER: Meds ordered this encounter  Medications  . metoprolol tartrate (LOPRESSOR) 50 MG tablet    Sig: Take 1 tablet (50 mg total) by mouth 2 (two) times daily.    Dispense:  180 tablet    Refill:  0    DX Code Needed  .  Marland Kitchen aspirin EC 81 MG tablet    Sig: Take 1 tablet (81 mg total) by mouth daily. Swallow whole.    Dispense:  90 tablet    Refill:  3  . nitroGLYCERIN (NITROSTAT) 0.4 MG SL tablet    Sig: Place 1 tablet (0.4 mg total) under the tongue every 5 (five) minutes as needed for chest pain. If you require more than two tablets five minutes apart go to the nearest ER via EMS.    Dispense:  30 tablet    Refill:  0    Medications Discontinued During This Encounter  Medication Reason  . ferrous sulfate 325 (65 FE) MG tablet Error  . metoprolol tartrate (LOPRESSOR) 50 MG tablet Error  . tamsulosin (FLOMAX) 0.4 MG CAPS capsule Error     Current Outpatient Medications:  .  alclomethasone (ACLOVATE) 0.01 % cream, 1 APPLICATION TO AFFECTED AREA ONCE A DAY AS NEEDED FOR FACE  FOR ECZEMA EXTERNALLY 14 DAYS, Disp: , Rfl: 0 .  amLODipine (NORVASC) 10 MG tablet, TAKE 1 TABLET BY MOUTH EVERY DAY, Disp: 90 tablet, Rfl: 1 .  aspirin EC 81 MG tablet, Take 1 tablet (81 mg total) by mouth daily. Swallow whole., Disp: 90 tablet, Rfl: 3 .  atorvastatin (LIPITOR) 10 MG tablet, Take 10 mg by mouth at bedtime., Disp: , Rfl:  .  diclofenac sodium (VOLTAREN) 1 % GEL, 3 grams to 3 large joints up to 3 times daily, Disp: 3 Tube, Rfl: 3 .  finasteride (PROSCAR) 5 MG tablet, TAKE 1 TABLET BY MOUTH EVERY DAY, Disp: 90 tablet, Rfl: 1 .  hydrOXYzine (ATARAX/VISTARIL) 10 MG tablet, , Disp: , Rfl: 1 .  nitroGLYCERIN (NITROSTAT) 0.4 MG SL tablet, Place 1 tablet (0.4 mg total) under the tongue every 5 (five) minutes as needed for chest pain. If you require more than two tablets five minutes apart go to the nearest ER via EMS., Disp: 30 tablet, Rfl: 0 .  omeprazole (PRILOSEC) 40 MG capsule, Take 1 capsule (40 mg total) by mouth 2 (two) times daily., Disp: 180 capsule, Rfl: 1 .  metoprolol tartrate (LOPRESSOR) 50 MG tablet, Take 1 tablet (50 mg total) by mouth 2 (two) times daily., Disp: 180 tablet, Rfl: 0  Orders Placed This Encounter  Procedures  . CT CORONARY MORPH W/CTA COR W/SCORE W/CA W/CM &/OR WO/CM  . CT CORONARY FRACTIONAL FLOW RESERVE DATA PREP  . CT CORONARY FRACTIONAL FLOW RESERVE FLUID ANALYSIS  . EKG 12-Lead  . PCV ECHOCARDIOGRAM COMPLETE    There are no Patient Instructions on file for this visit.   --Continue cardiac medications as reconciled in final medication list. --Return in about 3 weeks (around 12/16/2020) for Chest pain, Review test results. Or sooner if needed. --Continue follow-up with your primary care  physician regarding the management of your other chronic comorbid conditions.  Patient's questions and concerns were addressed to his satisfaction. He voices understanding of the instructions provided during this encounter.   This note was created using a voice  recognition software as a result there may be grammatical errors inadvertently enclosed that do not reflect the nature of this encounter. Every attempt is made to correct such errors.  Rex Kras, [place], [facility]  Pager: [phone] Office: [phone]

## 2020-12-08 ENCOUNTER — Other Ambulatory Visit: Payer: Self-pay | Admitting: Cardiology

## 2020-12-08 DIAGNOSIS — R072 Precordial pain: Secondary | ICD-10-CM

## 2020-12-12 ENCOUNTER — Telehealth (HOSPITAL_COMMUNITY): Payer: Self-pay | Admitting: Emergency Medicine

## 2020-12-12 NOTE — Telephone Encounter (Signed)
Pt returning phone call regarding upcoming cardiac imaging study; pt verbalizes understanding of appt date/time, parking situation and where to check in, pre-test NPO status and medications ordered, and verified current allergies; name and call back number provided for further questions should they arise Marchia Bond RN Navigator Cardiac [facility] [place] [facility] [phone] office [phone] cell   Pt states he is muslim and practicing fasting for Ramadan. Will drink plenty of fluids otherwise. Getting labs drawn today and takes 25mg  metop tart BID

## 2020-12-12 NOTE — Telephone Encounter (Signed)
Attempted to call patient regarding upcoming cardiac CT appointment. Left message on voicemail with name and callback number Marchia Bond RN Navigator Cardiac [facility] [phone] Office [phone] Cell  Also requested labs be drawn

## 2020-12-13 ENCOUNTER — Ambulatory Visit: Payer: 59

## 2020-12-13 ENCOUNTER — Other Ambulatory Visit: Payer: Self-pay

## 2020-12-13 DIAGNOSIS — R072 Precordial pain: Secondary | ICD-10-CM

## 2020-12-13 LAB — BASIC METABOLIC PANEL
BUN/Creatinine Ratio: 11 (ref 10–24)
BUN: 12 mg/dL (ref 8–27)
CO2: 21 mmol/L (ref 20–29)
Calcium: 9.8 mg/dL (ref 8.6–10.2)
Chloride: 102 mmol/L (ref 96–106)
Creatinine, Ser: 1.13 mg/dL (ref 0.76–1.27)
Glucose: 95 mg/dL (ref 65–99)
Potassium: 4.7 mmol/L (ref 3.5–5.2)
Sodium: 138 mmol/L (ref 134–144)
eGFR: 74 mL/min/{1.73_m2} (ref >59)

## 2020-12-14 ENCOUNTER — Ambulatory Visit (HOSPITAL_COMMUNITY)
Admission: RE | Admit: 2020-12-14 | Discharge: 2020-12-14 | Disposition: A | Discharge status: Home / Self Care | Payer: 59 | Source: Ambulatory Visit | Attending: Cardiology | Admitting: Cardiology

## 2020-12-14 DIAGNOSIS — Z006 Encounter for examination for normal comparison and control in clinical research program: Secondary | ICD-10-CM

## 2020-12-14 DIAGNOSIS — R072 Precordial pain: Secondary | ICD-10-CM | POA: Diagnosis not present

## 2020-12-14 MED ORDER — NITROGLYCERIN 0.4 MG SL SUBL
SUBLINGUAL_TABLET | SUBLINGUAL | Status: AC
  Filled 2020-12-14: qty 2

## 2020-12-14 MED ORDER — METOPROLOL TARTRATE 5 MG/5ML IV SOLN
INTRAVENOUS | Status: AC
  Filled 2020-12-14: qty 5

## 2020-12-14 MED ORDER — NITROGLYCERIN 0.4 MG SL SUBL
0.8000 mg | SUBLINGUAL_TABLET | Freq: Once | SUBLINGUAL | Status: AC
  Administered 2020-12-14: 0.8 mg via SUBLINGUAL

## 2020-12-14 MED ORDER — IOHEXOL 350 MG/ML SOLN
95.0000 mL | Freq: Once | INTRAVENOUS | Status: AC | PRN
  Administered 2020-12-14: 95 mL via INTRAVENOUS

## 2020-12-14 NOTE — Progress Notes (Signed)
Identify Informed Consent   Subject Name: Eugene Mccullough  Subject met inclusion and exclusion criteria.  The informed consent form, study requirements and expectations were reviewed with the subject and questions and concerns were addressed prior to the signing of the consent form.  The subject verbalized understanding of the trial requirements.  The subject agreed to participate in the Identify trial and signed the informed consent at 1415 on 12/14/2020.  The informed consent was obtained prior to performance of any protocol-specific procedures for the subject.  A copy of the signed informed consent was given to the subject and a copy was placed in the subject's medical record.   [name]

## 2020-12-15 ENCOUNTER — Encounter: Payer: Self-pay | Admitting: Cardiology

## 2020-12-15 ENCOUNTER — Other Ambulatory Visit: Payer: Self-pay

## 2020-12-15 ENCOUNTER — Ambulatory Visit: Payer: 59 | Admitting: Cardiology

## 2020-12-15 VITALS — BP 115/74 | HR 68 | Temp 98.2°F | Resp 16 | Ht 67.0 in | Wt 204.6 lb

## 2020-12-15 DIAGNOSIS — Z87891 Personal history of nicotine dependence: Secondary | ICD-10-CM

## 2020-12-15 DIAGNOSIS — E782 Mixed hyperlipidemia: Secondary | ICD-10-CM

## 2020-12-15 DIAGNOSIS — I10 Essential (primary) hypertension: Secondary | ICD-10-CM

## 2020-12-15 DIAGNOSIS — I2510 Atherosclerotic heart disease of native coronary artery without angina pectoris: Secondary | ICD-10-CM

## 2020-12-15 DIAGNOSIS — I25118 Atherosclerotic heart disease of native coronary artery with other forms of angina pectoris: Secondary | ICD-10-CM

## 2020-12-15 MED ORDER — LOSARTAN POTASSIUM 25 MG PO TABS: 25.0000 mg | ORAL_TABLET | Freq: Every day | ORAL | 0 refills | Status: DC

## 2020-12-15 MED ORDER — LOSARTAN POTASSIUM 50 MG PO TABS: 50.0000 mg | ORAL_TABLET | Freq: Every morning | ORAL | 0 refills | Status: DC

## 2020-12-15 MED ORDER — ATORVASTATIN CALCIUM 80 MG PO TABS: 80.0000 mg | ORAL_TABLET | Freq: Every day | ORAL | 0 refills | Status: DC

## 2020-12-15 NOTE — Progress Notes (Signed)
Date:  12/15/2020   ID:  Eugene Mccullough, DOB [birth year], MRN [id number]  PCP:  Malena Peer, MD  Cardiologist:  Rex Kras, DO, [facility] (established care 11/25/2020) Former Cardiology Providers: Dr. Lyman Bishop   Date: 12/15/2020 Last Office Visit: 11/25/2020  Chief Complaint  Patient presents with  . Precordial pain  . Results    Echo, CCTA   . Follow-up    HPI  Eugene Mccullough is a 60 y.o. African-American male who presents to the office with a chief complaint of " reevaluation of chest pain and review diagnostic testing." Patient's past medical history and cardiovascular risk factors include: HTN, HLD, BPH, mild coronary artery calcification, 26-pack-year history of smoking, quit in 2014, obesity due to excess calories.  He is referred to the office at the request of [name], [name], MD for evaluation of chest pain.  During initial consultation patient describes chest pain suggestive of angina pectoris ongoing for the last 3 years.  Patient states that he underwent stress testing which was overall a low risk study and therefore no additional testing was performed.  However, due to his symptoms the plan was to undergo coronary CTA and an echocardiogram.  Patient is asked to come to the office sooner than his scheduled office visit due to high risk findings on the coronary CTA.  At today's office visit patient does not have chest pain.  His last episode of chest pain was last week while doing effort related activities, symptoms lasted for about 2 to 3 minutes, improves with rest.  He has not required sublingual nitroglycerin tablets.  And he has not started aspirin as requested at the last office visit.  No family history of premature coronary disease or sudden cardiac death.  FUNCTIONAL STATUS: No structured exercise program but tries to keep himself active with washing cars, taking care of his yard, and doing work around the house.  ALLERGIES: Allergies  Allergen Reactions  .  Hydrocodone-Acetaminophen Itching    MEDICATION LIST PRIOR TO VISIT: Current Meds  Medication Sig  . alclomethasone (ACLOVATE) 6.65 % cream 1 APPLICATION TO AFFECTED AREA ONCE A DAY AS NEEDED FOR FACE FOR ECZEMA EXTERNALLY 14 DAYS  . aspirin EC 81 MG tablet Take 1 tablet (81 mg total) by mouth daily. Swallow whole.  Marland Kitchen atorvastatin (LIPITOR) 80 MG tablet Take 1 tablet (80 mg total) by mouth at bedtime.  . finasteride (PROSCAR) 5 MG tablet TAKE 1 TABLET BY MOUTH EVERY DAY  . hydrOXYzine (ATARAX/VISTARIL) 10 MG tablet   . losartan (COZAAR) 25 MG tablet Take 1 tablet (25 mg total) by mouth daily.  . metoprolol tartrate (LOPRESSOR) 50 MG tablet Take 1 tablet (50 mg total) by mouth 2 (two) times daily.  . nitroGLYCERIN (NITROSTAT) 0.4 MG SL tablet Place 1 tablet (0.4 mg total) under the tongue every 5 (five) minutes as needed for chest pain. If you require more than two tablets five minutes apart go to the nearest ER via EMS.  Marland Kitchen omeprazole (PRILOSEC) 40 MG capsule Take 1 capsule (40 mg total) by mouth 2 (two) times daily.  . tamsulosin (FLOMAX) 0.4 MG CAPS capsule Take 0.4 mg by mouth.  . [DISCONTINUED] amLODipine (NORVASC) 10 MG tablet TAKE 1 TABLET BY MOUTH EVERY DAY  . [DISCONTINUED] atorvastatin (LIPITOR) 10 MG tablet Take 10 mg by mouth at bedtime.  . [DISCONTINUED] losartan (COZAAR) 50 MG tablet Take 1 tablet (50 mg total) by mouth every morning.     PAST MEDICAL HISTORY: Past Medical History:  Diagnosis Date  . Anemia   . BPH (benign prostatic hyperplasia)   . Eczema   . Gastric ulcer   . GERD (gastroesophageal reflux disease)   . Gout   . H. pylori infection   . Hyperlipidemia   . Hypertension   . IBS (irritable bowel syndrome)   . Sleep apnea    Does not use CPAP    PAST SURGICAL HISTORY: Past Surgical History:  Procedure Laterality Date  . CHOLECYSTECTOMY  2005  . COLONOSCOPY  08/2010  . UPPER GI ENDOSCOPY  2019  . WISDOM TOOTH EXTRACTION      FAMILY HISTORY: The  patient family history includes Diabetes in his brother and sister; Eating disorder in his daughter; Eczema in his son; Hyperlipidemia in his mother; Hypertension in his sister; Irritable bowel syndrome in his son; Obesity in his sister.  SOCIAL HISTORY:  The patient  reports that he has quit smoking. His smoking use included cigarettes. He has a 20.00 pack-year smoking history. He has never used smokeless tobacco. He reports that he does not drink alcohol and does not use drugs.  REVIEW OF SYSTEMS: Review of Systems  Constitutional: Negative for chills and fever.  HENT: Negative for hoarse voice and nosebleeds.   Eyes: Negative for discharge, double vision and pain.  Cardiovascular: Positive for chest pain. Negative for claudication, dyspnea on exertion, leg swelling, near-syncope, orthopnea, palpitations, paroxysmal nocturnal dyspnea and syncope.  Respiratory: Negative for hemoptysis and shortness of breath.   Musculoskeletal: Negative for muscle cramps and myalgias.  Gastrointestinal: Positive for heartburn. Negative for abdominal pain, constipation, diarrhea, hematemesis, hematochezia, melena, nausea and vomiting.  Neurological: Negative for dizziness and light-headedness.    PHYSICAL EXAM: Vitals with BMI 12/15/2020 12/14/2020 12/14/2020  Height '5\' 7"'  - -  Weight 204 lbs 10 oz - -  BMI 81.44 - -  Systolic 818 563 149  Diastolic 74 71 87  Pulse 68 - 73    CONSTITUTIONAL: Well-developed and well-nourished. No acute distress.  SKIN: Skin is warm and dry. No rash noted. No cyanosis. No pallor. No jaundice HEAD: Normocephalic and atraumatic.  EYES: No scleral icterus MOUTH/THROAT: Moist oral membranes.  NECK: No JVD present. No thyromegaly noted. No carotid bruits  LYMPHATIC: No visible cervical adenopathy.  CHEST Normal respiratory effort. No intercostal retractions  LUNGS: Clear to auscultation bilaterally.  No stridor. No wheezes. No rales.  CARDIOVASCULAR: Regular, positive  S1-S2, no murmurs rubs or gallops appreciated ABDOMINAL: No apparent ascites.  EXTREMITIES: No peripheral edema  HEMATOLOGIC: No significant bruising NEUROLOGIC: Oriented to person, place, and time. Nonfocal. Normal muscle tone.  PSYCHIATRIC: Normal mood and affect. Normal behavior. Cooperative  CARDIAC DATABASE: EKG: 11/25/2020: Sinus tachycardia, 101 bpm, left axis deviation, poor R wave progression, old inferior infarct.    Echocardiogram: 12/13/2020: Normal LV systolic function with visual EF 50-55%. Left ventricle cavity is normal in size. Mild left ventricular hypertrophy. Normal global wall motion. Indeterminate diastolic filling pattern, normal LAP. Left atrial cavity is severely dilated. Mild (Grade I) mitral regurgitation. Mild tricuspid regurgitation. No evidence of pulmonary hypertension. Mild pulmonic regurgitation. IVC is dilated with a respiratory response of >50%. No prior study for comparison.    Stress Testing: Nuclear stress test 05/01/2017:  The left ventricular ejection fraction is normal (55-65%).  Nuclear stress EF: 59%.  Blood pressure demonstrated a normal response to exercise.  There was no ST segment deviation noted during stress.  Defect 1: There is a small defect of moderate severity present in the basal  inferior location.  This is a low risk study.   Low risk stress nuclear study with probable inferobasal thinning and no significant ischemia; EF 59 with normal wall motion.  Coronary CTA 12/14/2020: 1. Coronary calcium score of 24.6 AU. This was 70th percentile for age and sex matched control. 2. Normal coronary origin with right dominance. 3. CAD-RADS = 4. Severe stenosis (70-99%) at the proximal /mid LAD due to mixed plaque. First diagonal branch patent but diffuse disease within the ostial to proximal segment. Second diagonal branch is overall patent. Diffuse disease within the proximal to mid segment LCx. RCA without evidence of plaque or  stenosis. 4. Study is sent for CT-FFR to further evaluate the LAD, D1, and LCx. Findings will be performed and reported separately.  Noncardiac impressions: 1. No acute findings in the imaged extracardiac chest. 2. Moderate hiatal hernia. 3. Mild hepatic steatosis. 4. Aortic Atherosclerosis (ICD10-I70.0).  Coronary CT FFR 12/15/2020: CT FFR analysis showed significant stenosis at mid LAD (modeled as total occlusion), mid-to-distal LCX, and acute marginal modeled as total occlusion.  Heart Catheterization: None  LABORATORY DATA: CBC Latest Ref Rng & Units 08/14/2018 07/02/2018 02/21/2018  WBC 4.0 - 10.5 K/uL 5.9 5.5 5.7  Hemoglobin 13.0 - 17.0 g/dL 15.1 14.6 14.0  Hematocrit 39.0 - 52.0 % 44.5 42.1 40.5  Platelets 150.0 - 400.0 K/uL 141.0(L) 145.0(L) 161.0    CMP Latest Ref Rng & Units 12/12/2020 08/14/2018 02/21/2018  Glucose 65 - 99 mg/dL 95 90 101(H)  BUN 8 - 27 mg/dL '12 20 9  ' Creatinine 0.76 - 1.27 mg/dL 1.13 1.16 0.98  Sodium 134 - 144 mmol/L 138 137 138  Potassium 3.5 - 5.2 mmol/L 4.7 4.7 3.7  Chloride 96 - 106 mmol/L 102 102 105  CO2 20 - 29 mmol/L '21 28 25  ' Calcium 8.6 - 10.2 mg/dL 9.8 10.1 9.4  Total Protein 6.0 - 8.3 g/dL - 8.3 7.8  Total Bilirubin 0.2 - 1.2 mg/dL - 0.5 0.5  Alkaline Phos 39 - 117 U/L - 72 69  AST 0 - 37 U/L - 26 21  ALT 0 - 53 U/L - 44 36    Lipid Panel  Lab Results  Component Value Date   CHOL 151 07/02/2018   HDL 35.90 (L) 07/02/2018   LDLCALC 109 (H) 02/25/2017   LDLDIRECT 104.0 07/02/2018   TRIG 252.0 (H) 07/02/2018   CHOLHDL 4 07/02/2018    No components found for: NTPROBNP No results for input(s): PROBNP in the last 8760 hours. No results for input(s): TSH in the last 8760 hours.  BMP Recent Labs    12/12/20 1419  NA 138  K 4.7  CL 102  CO2 21  GLUCOSE 95  BUN 12  CREATININE 1.13  CALCIUM 9.8    HEMOGLOBIN A1C Lab Results  Component Value Date   HGBA1C 5.9 02/25/2017   External Labs: Collected: 11/09/2020, received  from PCP Hemoglobin 15.8 g/dL, hematocrit 48% Platelets 143 Creatinine 0.93 mg/dL. eGFR: 103 mL/min per 1.73 m Potassium 4.9 AST 23, ALT 40, alkaline phosphatase 108 Lipid profile: Total cholesterol 191, triglycerides 91, HDL 47, LDL 125, non-HDL 144 Hemoglobin A1c: 5.3 TSH: 1.07  IMPRESSION:    ICD-10-CM   1. Atherosclerosis of native coronary artery of native heart with stable angina pectoris (HCC)  I25.118 atorvastatin (LIPITOR) 80 MG tablet    losartan (COZAAR) 25 MG tablet    CMP14+EGFR    Lipid Panel With LDL/HDL Ratio    LDL cholesterol, direct    Pro  b natriuretic peptide (BNP)    Magnesium    CBC    SARS-COV-2 RNA,(COVID-19) QUAL NAAT    DISCONTINUED: losartan (COZAAR) 50 MG tablet  2. Essential hypertension  I10 losartan (COZAAR) 25 MG tablet  3. Coronary artery calcification seen on computed tomography  I25.10   4. Mixed hyperlipidemia  E78.2   5. Former smoker  Z87.891      RECOMMENDATIONS: Eugene Mccullough is a 60 y.o. male whose past medical history and cardiac risk factors include:  HTN, HLD, BPH, mild coronary artery calcification, 26-pack-year history of smoking, quit in 2014, obesity due to excess calories.  Atherosclerosis of the native coronary arteries with stable angina pectoris:  Reviewed the results of the echocardiogram and coronary CTA with FFR results with both the patient and his wife at today's visit.  No active chest pain at the time of the evaluation.  Last episode of chest pain was last week.  Patient is asked to seek medical attention sooner if he has recurrence of his chest pain as a coronary CTA is concerning for multivessel CAD.  Patient verbalizes understanding and provides verbal feedback.  Reemphasized the importance of starting aspirin 81 mg p.o. daily.  Will start Lipitor 80 mg p.o. nightly  Continue Lopressor.  Reemphasized the use of nitroglycerin tablets on as needed basis  Will check fasting lipid profile, CMP, CBC,  magnesium level, and COVID screen prior to upcoming angiography  The procedure of left heart catheterization with possible intervention was explained to the patient and his wife in detail.   The indication, alternatives, risks and benefits were reviewed.   Complications include but not limited to bleeding, infection, vascular injury, stroke, myocardial infection, arrhythmia, kidney injury requiring hemodialysis, radiation-related injury in the case of prolonged fluoroscopy use, emergency cardiac surgery, and death. The patient understands the risks of serious complication is 1-2 in 3536 with diagnostic cardiac cath and 1-2% or less with angioplasty/stenting.   The patient and his wife voices understanding and provides verbal feedback and wishes to proceed with coronary angiography with possible PCI.  Of note, reviewed the results of the coronary CTA including the images with both the patient and his wife at today's office visit.  Coronary artery calcification: Continue aspirin and statin therapy  Benign essential hypertension:  Office blood pressures well controlled  Medications reconciled.  Patient is asked to keep a log of his blood pressures and to review it with either his PCP or myself at the next office visit.  Low-salt diet recommended.  Mixed hyperlipidemia:  Given concern for multivessel CAD and upcoming left heart catheterization we will increase his Lipitor to 80 mg p.o. nightly.   Educated on importance of dietary restrictions.  Does not endorse any myalgias.  Former smoker: Educated on the importance of continued smoking cessation.  FINAL MEDICATION LIST END OF ENCOUNTER: Meds ordered this encounter  Medications  . atorvastatin (LIPITOR) 80 MG tablet    Sig: Take 1 tablet (80 mg total) by mouth at bedtime.    Dispense:  90 tablet    Refill:  0  . DISCONTD: losartan (COZAAR) 50 MG tablet    Sig: Take 1 tablet (50 mg total) by mouth every morning.    Dispense:   90 tablet    Refill:  0  . losartan (COZAAR) 25 MG tablet    Sig: Take 1 tablet (25 mg total) by mouth daily.    Dispense:  90 tablet    Refill:  0    Medications  Discontinued During This Encounter  Medication Reason  . diclofenac sodium (VOLTAREN) 1 % GEL Error  . atorvastatin (LIPITOR) 10 MG tablet Dose change  . amLODipine (NORVASC) 10 MG tablet Change in therapy  . losartan (COZAAR) 50 MG tablet Dose change     Current Outpatient Medications:  .  alclomethasone (ACLOVATE) 4.23 % cream, 1 APPLICATION TO AFFECTED AREA ONCE A DAY AS NEEDED FOR FACE FOR ECZEMA EXTERNALLY 14 DAYS, Disp: , Rfl: 0 .  aspirin EC 81 MG tablet, Take 1 tablet (81 mg total) by mouth daily. Swallow whole., Disp: 90 tablet, Rfl: 3 .  atorvastatin (LIPITOR) 80 MG tablet, Take 1 tablet (80 mg total) by mouth at bedtime., Disp: 90 tablet, Rfl: 0 .  finasteride (PROSCAR) 5 MG tablet, TAKE 1 TABLET BY MOUTH EVERY DAY, Disp: 90 tablet, Rfl: 1 .  hydrOXYzine (ATARAX/VISTARIL) 10 MG tablet, , Disp: , Rfl: 1 .  losartan (COZAAR) 25 MG tablet, Take 1 tablet (25 mg total) by mouth daily., Disp: 90 tablet, Rfl: 0 .  metoprolol tartrate (LOPRESSOR) 50 MG tablet, Take 1 tablet (50 mg total) by mouth 2 (two) times daily., Disp: 180 tablet, Rfl: 0 .  nitroGLYCERIN (NITROSTAT) 0.4 MG SL tablet, Place 1 tablet (0.4 mg total) under the tongue every 5 (five) minutes as needed for chest pain. If you require more than two tablets five minutes apart go to the nearest ER via EMS., Disp: 30 tablet, Rfl: 0 .  omeprazole (PRILOSEC) 40 MG capsule, Take 1 capsule (40 mg total) by mouth 2 (two) times daily., Disp: 180 capsule, Rfl: 1 .  tamsulosin (FLOMAX) 0.4 MG CAPS capsule, Take 0.4 mg by mouth., Disp: , Rfl:   Orders Placed This Encounter  Procedures  . SARS-COV-2 RNA,(COVID-19) QUAL NAAT  . CMP14+EGFR  . Lipid Panel With LDL/HDL Ratio  . LDL cholesterol, direct  . Pro b natriuretic peptide (BNP)  . Magnesium  . CBC    There are  no Patient Instructions on file for this visit.   --Continue cardiac medications as reconciled in final medication list. --Return in about 4 weeks (around 01/12/2021) for Follow up, Post heart catheterization. Or sooner if needed. --Continue follow-up with your primary care physician regarding the management of your other chronic comorbid conditions.  Patient's questions and concerns were addressed to his satisfaction. He voices understanding of the instructions provided during this encounter.   Total time spent: 45 minutes discussing the results of the coronary CTA, echocardiogram, coronary CT FFR results.  Reevaluation of symptoms.  Encouraged him to go to the hospital for more expedited evaluation; however, patient states that he is currently asymptomatic and would like to do it electively.  However he understands that if his symptoms increase in intensity, frequency, and/or duration he will seek medical attention by going to the closest ER via EMS.  Discussing disease management, obtaining consent for upcoming heart catheterization, ordering lab work, and coordination of care.  This note was created using a voice recognition software as a result there may be grammatical errors inadvertently enclosed that do not reflect the nature of this encounter. Every attempt is made to correct such errors.  Rex Kras, [place], [facility]  Pager: [phone] Office: [phone]

## 2020-12-17 ENCOUNTER — Other Ambulatory Visit: Payer: Self-pay | Admitting: Cardiology

## 2020-12-17 DIAGNOSIS — R072 Precordial pain: Secondary | ICD-10-CM

## 2020-12-19 ENCOUNTER — Other Ambulatory Visit (HOSPITAL_COMMUNITY)
Admission: RE | Admit: 2020-12-19 | Discharge: 2020-12-19 | Disposition: A | Discharge status: Home / Self Care | Payer: 59 | Source: Ambulatory Visit | Attending: Cardiology | Admitting: Cardiology

## 2020-12-19 ENCOUNTER — Other Ambulatory Visit (HOSPITAL_COMMUNITY): Payer: Self-pay

## 2020-12-19 DIAGNOSIS — Z20822 Contact with and (suspected) exposure to covid-19: Secondary | ICD-10-CM | POA: Diagnosis not present

## 2020-12-19 DIAGNOSIS — Z01812 Encounter for preprocedural laboratory examination: Secondary | ICD-10-CM | POA: Diagnosis present

## 2020-12-20 DIAGNOSIS — I2510 Atherosclerotic heart disease of native coronary artery without angina pectoris: Secondary | ICD-10-CM | POA: Diagnosis present

## 2020-12-20 LAB — CMP14+EGFR
ALT: 32 IU/L (ref 0–44)
AST: 27 IU/L (ref 0–40)
Albumin/Globulin Ratio: 1.4 (ref 1.2–2.2)
Albumin: 4.5 g/dL (ref 3.8–4.9)
Alkaline Phosphatase: 101 IU/L (ref 44–121)
BUN/Creatinine Ratio: 8 — ABNORMAL LOW (ref 10–24)
BUN: 10 mg/dL (ref 8–27)
Bilirubin Total: 0.6 mg/dL (ref 0.0–1.2)
CO2: 23 mmol/L (ref 20–29)
Calcium: 9.8 mg/dL (ref 8.6–10.2)
Chloride: 103 mmol/L (ref 96–106)
Creatinine, Ser: 1.27 mg/dL (ref 0.76–1.27)
Globulin, Total: 3.2 g/dL (ref 1.5–4.5)
Glucose: 106 mg/dL — ABNORMAL HIGH (ref 65–99)
Potassium: 4.9 mmol/L (ref 3.5–5.2)
Sodium: 139 mmol/L (ref 134–144)
Total Protein: 7.7 g/dL (ref 6.0–8.5)
eGFR: 65 mL/min/{1.73_m2} (ref >59)

## 2020-12-20 LAB — LIPID PANEL WITH LDL/HDL RATIO
Cholesterol, Total: 113 mg/dL (ref 100–199)
HDL: 29 mg/dL — ABNORMAL LOW (ref >39)
LDL Chol Calc (NIH): 67 mg/dL (ref 0–99)
LDL/HDL Ratio: 2.3 ratio (ref 0.0–3.6)
Triglycerides: 88 mg/dL (ref 0–149)
VLDL Cholesterol Cal: 17 mg/dL (ref 5–40)

## 2020-12-20 LAB — CBC
Hematocrit: 41.6 % (ref 37.5–51.0)
Hemoglobin: 14.2 g/dL (ref 13.0–17.7)
MCH: 27.6 pg (ref 26.6–33.0)
MCHC: 34.1 g/dL (ref 31.5–35.7)
MCV: 81 fL (ref 79–97)
Platelets: 163 10*3/uL (ref 150–450)
RBC: 5.14 x10E6/uL (ref 4.14–5.80)
RDW: 13.2 % (ref 11.6–15.4)
WBC: 5 10*3/uL (ref 3.4–10.8)

## 2020-12-20 LAB — SARS CORONAVIRUS 2 (TAT 6-24 HRS): SARS Coronavirus 2: NEGATIVE

## 2020-12-20 LAB — PRO B NATRIURETIC PEPTIDE: NT-Pro BNP: 99 pg/mL (ref 0–210)

## 2020-12-20 LAB — MAGNESIUM: Magnesium: 2 mg/dL (ref 1.6–2.3)

## 2020-12-20 LAB — LDL CHOLESTEROL, DIRECT: LDL Direct: 68 mg/dL (ref 0–99)

## 2020-12-21 ENCOUNTER — Ambulatory Visit (HOSPITAL_COMMUNITY)
Admission: RE | Admit: 2020-12-21 | Discharge: 2020-12-21 | Disposition: A | Discharge status: Home / Self Care | Payer: 59 | Attending: Cardiology | Admitting: Cardiology

## 2020-12-21 ENCOUNTER — Encounter (HOSPITAL_COMMUNITY)
Admission: RE | Disposition: A | Discharge status: Home / Self Care | Payer: Self-pay | Source: Home / Self Care | Attending: Cardiology

## 2020-12-21 DIAGNOSIS — Z6831 Body mass index (BMI) 31.0-31.9, adult: Secondary | ICD-10-CM | POA: Insufficient documentation

## 2020-12-21 DIAGNOSIS — Z7982 Long term (current) use of aspirin: Secondary | ICD-10-CM | POA: Insufficient documentation

## 2020-12-21 DIAGNOSIS — Z79899 Other long term (current) drug therapy: Secondary | ICD-10-CM | POA: Diagnosis not present

## 2020-12-21 DIAGNOSIS — Z87891 Personal history of nicotine dependence: Secondary | ICD-10-CM | POA: Diagnosis not present

## 2020-12-21 DIAGNOSIS — E782 Mixed hyperlipidemia: Secondary | ICD-10-CM | POA: Insufficient documentation

## 2020-12-21 DIAGNOSIS — I2582 Chronic total occlusion of coronary artery: Secondary | ICD-10-CM | POA: Diagnosis not present

## 2020-12-21 DIAGNOSIS — I10 Essential (primary) hypertension: Secondary | ICD-10-CM | POA: Diagnosis not present

## 2020-12-21 DIAGNOSIS — I25118 Atherosclerotic heart disease of native coronary artery with other forms of angina pectoris: Secondary | ICD-10-CM | POA: Insufficient documentation

## 2020-12-21 DIAGNOSIS — I2510 Atherosclerotic heart disease of native coronary artery without angina pectoris: Secondary | ICD-10-CM | POA: Diagnosis present

## 2020-12-21 DIAGNOSIS — E669 Obesity, unspecified: Secondary | ICD-10-CM | POA: Insufficient documentation

## 2020-12-21 HISTORY — PX: LEFT HEART CATH AND CORONARY ANGIOGRAPHY: CATH118249

## 2020-12-21 LAB — POCT ACTIVATED CLOTTING TIME: Activated Clotting Time: 279 seconds

## 2020-12-21 SURGERY — LEFT HEART CATH AND CORONARY ANGIOGRAPHY
Anesthesia: LOCAL

## 2020-12-21 MED ORDER — LIDOCAINE HCL (PF) 1 % IJ SOLN
INTRAMUSCULAR | Status: DC | PRN
  Administered 2020-12-21: 2 mL via INTRADERMAL

## 2020-12-21 MED ORDER — FENTANYL CITRATE (PF) 100 MCG/2ML IJ SOLN
INTRAMUSCULAR | Status: AC
  Filled 2020-12-21: qty 2

## 2020-12-21 MED ORDER — SODIUM CHLORIDE 0.9% FLUSH: 3.0000 mL | Freq: Two times a day (BID) | INTRAVENOUS | Status: DC

## 2020-12-21 MED ORDER — HEPARIN SODIUM (PORCINE) 1000 UNIT/ML IJ SOLN
INTRAMUSCULAR | Status: AC
  Filled 2020-12-21: qty 1

## 2020-12-21 MED ORDER — VERAPAMIL HCL 2.5 MG/ML IV SOLN
INTRAVENOUS | Status: DC | PRN
  Administered 2020-12-21: 10 mL via INTRA_ARTERIAL

## 2020-12-21 MED ORDER — FENTANYL CITRATE (PF) 100 MCG/2ML IJ SOLN
INTRAMUSCULAR | Status: DC | PRN
  Administered 2020-12-21: 50 ug via INTRAVENOUS

## 2020-12-21 MED ORDER — SODIUM CHLORIDE 0.9 % IV SOLN: 250.0000 mL | INTRAVENOUS | Status: DC | PRN

## 2020-12-21 MED ORDER — MIDAZOLAM HCL 2 MG/2ML IJ SOLN
INTRAMUSCULAR | Status: AC
  Filled 2020-12-21: qty 2

## 2020-12-21 MED ORDER — LABETALOL HCL 5 MG/ML IV SOLN: 10.0000 mg | INTRAVENOUS | Status: DC | PRN

## 2020-12-21 MED ORDER — HYDRALAZINE HCL 20 MG/ML IJ SOLN: 10.0000 mg | INTRAMUSCULAR | Status: DC | PRN

## 2020-12-21 MED ORDER — ACETAMINOPHEN 325 MG PO TABS
650.0000 mg | ORAL_TABLET | ORAL | Status: DC | PRN
  Administered 2020-12-21: 650 mg via ORAL
  Filled 2020-12-21: qty 2

## 2020-12-21 MED ORDER — SODIUM CHLORIDE 0.9 % WEIGHT BASED INFUSION: 1.0000 mL/kg/h | INTRAVENOUS | Status: DC

## 2020-12-21 MED ORDER — MIDAZOLAM HCL 2 MG/2ML IJ SOLN
INTRAMUSCULAR | Status: DC | PRN
  Administered 2020-12-21: 1 mg via INTRAVENOUS

## 2020-12-21 MED ORDER — HEPARIN SODIUM (PORCINE) 1000 UNIT/ML IJ SOLN
INTRAMUSCULAR | Status: DC | PRN
Start: 2020-12-21 — End: 2020-12-21
  Administered 2020-12-21 (×2): 4500 [IU] via INTRAVENOUS

## 2020-12-21 MED ORDER — HEPARIN (PORCINE) IN NACL 1000-0.9 UT/500ML-% IV SOLN
INTRAVENOUS | Status: DC | PRN
  Administered 2020-12-21 (×2): 500 mL

## 2020-12-21 MED ORDER — LIDOCAINE HCL (PF) 1 % IJ SOLN
INTRAMUSCULAR | Status: AC
  Filled 2020-12-21: qty 30

## 2020-12-21 MED ORDER — ONDANSETRON HCL 4 MG/2ML IJ SOLN: 4.0000 mg | Freq: Four times a day (QID) | INTRAMUSCULAR | Status: DC | PRN

## 2020-12-21 MED ORDER — SODIUM CHLORIDE 0.9% FLUSH: 3.0000 mL | INTRAVENOUS | Status: DC | PRN

## 2020-12-21 MED ORDER — SODIUM CHLORIDE 0.9 % WEIGHT BASED INFUSION
3.0000 mL/kg/h | INTRAVENOUS | Status: AC
  Administered 2020-12-21: 3 mL/kg/h via INTRAVENOUS

## 2020-12-21 MED ORDER — HEPARIN (PORCINE) IN NACL 1000-0.9 UT/500ML-% IV SOLN
INTRAVENOUS | Status: AC
  Filled 2020-12-21: qty 1000

## 2020-12-21 MED ORDER — ASPIRIN 81 MG PO CHEW: 81.0000 mg | CHEWABLE_TABLET | ORAL | Status: DC

## 2020-12-21 MED ORDER — SODIUM CHLORIDE 0.9 % IV SOLN: INTRAVENOUS | Status: AC

## 2020-12-21 MED ORDER — IOHEXOL 350 MG/ML SOLN
INTRAVENOUS | Status: DC | PRN
  Administered 2020-12-21: 65 mL

## 2020-12-21 MED ORDER — VERAPAMIL HCL 2.5 MG/ML IV SOLN
INTRAVENOUS | Status: AC
  Filled 2020-12-21: qty 2

## 2020-12-21 SURGICAL SUPPLY — 13 items
CATH 5FR JL3.5 JR4 ANG PIG MP (CATHETERS) ×2 IMPLANT
CATH LAUNCHER 6FR EBU 3 (CATHETERS) ×2 IMPLANT
DEVICE RAD COMP TR BAND LRG (VASCULAR PRODUCTS) ×2 IMPLANT
GLIDESHEATH SLEND A-KIT 6F 22G (SHEATH) ×2 IMPLANT
GUIDEWIRE INQWIRE 1.5J.035X260 (WIRE) ×1 IMPLANT
INQWIRE 1.5J .035X260CM (WIRE) ×2
KIT ENCORE 26 ADVANTAGE (KITS) ×2 IMPLANT
KIT HEART LEFT (KITS) ×2 IMPLANT
PACK CARDIAC CATHETERIZATION (CUSTOM PROCEDURE TRAY) ×2 IMPLANT
TRANSDUCER W/STOPCOCK (MISCELLANEOUS) ×2 IMPLANT
TUBING CIL FLEX 10 FLL-RA (TUBING) ×2 IMPLANT
VALVE COPILOT STAT (MISCELLANEOUS) ×2 IMPLANT
WIRE COUGAR XT STRL 190CM (WIRE) ×2 IMPLANT

## 2020-12-21 NOTE — Interval H&P Note (Signed)
History and Physical Interval Note:  12/21/2020 11:56 AM  Eugene Mccullough  has presented today for surgery, with the diagnosis of chest pain.  The various methods of treatment have been discussed with the patient and family. After consideration of risks, benefits and other options for treatment, the patient has consented to  Procedure(s): LEFT HEART CATH AND CORONARY ANGIOGRAPHY (N/A) as a surgical intervention.  The patient's history has been reviewed, patient examined, no change in status, stable for surgery.  I have reviewed the patient's chart and labs.  Questions were answered to the patient's satisfaction.    2016/2017 Appropriate Use Criteria for Coronary Revascularization Clinical Presentation: Diabetes Mellitus? Symptom Status? S/P CABG? Antianginal Therapy (# of long-acting drugs)? Results of Non-invasive testing? FFR/iFR results in all diseased vessels? Patient undergoing renal transplant? Patient undergoing percutaneous valve procedure (TAVR, MitraClip, Others)? Symptom Status:  Ischemic Symptoms  Non-invasive Testing:  High risk  If no or indeterminate stress test, FFR/iFR results in all diseased vessels:  N/A  Diabetes Mellitus:  No  S/P CABG:  No  Antianginal therapy (number of long-acting drugs):  >=2  Patient undergoing renal transplant:  No  Patient undergoing percutaneous valve procedure:  No    newline 1 Vessel Disease PCI CABG  No proximal LAD involvement, No proximal left dominant LCX involvement A (8); Indication 2 M (6); Indication 2   Proximal left dominant LCX involvement A (8); Indication 5 A (8); Indication 5   Proximal LAD involvement A (8); Indication 5 A (8); Indication 5   newline 2 Vessel Disease  No proximal LAD involvement A (8); Indication 8 A (7); Indication 8   Proximal LAD involvement A (8); Indication 11 A (8); Indication 11   newline 3 Vessel Disease  Low disease complexity (e.g., focal stenoses, SYNTAX <=22) A (8); Indication 17 A (8);  Indication 17   Intermediate or high disease complexity (e.g., SYNTAX >=23) M (6); Indication 21 A (9); Indication 21   newline Left Main Disease  Isolated LMCA disease: ostial or midshaft A (7); Indication 24 A (9); Indication 24   Isolated LMCA disease: bifurcation involvement M (6); Indication 25 A (9); Indication 25   LMCA ostial or midshaft, concurrent low disease burden multivessel disease (e.g., 1-2 additional focal stenoses, SYNTAX <=22) A (7); Indication 26 A (9); Indication 26   LMCA ostial or midshaft, concurrent intermediate or high disease burden multivessel disease (e.g., 1-2 additional bifurcation stenoses, long stenoses, SYNTAX >=23) M (4); Indication 27 A (9); Indication 27   LMCA bifurcation involvement, concurrent low disease burden multivessel disease (e.g., 1-2 additional focal stenoses, SYNTAX <=22) M (6); Indication 28 A (9); Indication 28   LMCA bifurcation involvement, concurrent intermediate or high disease burden multivessel disease (e.g., 1-2 additional bifurcation stenoses, long stenoses, SYNTAX >=23) R (3); Indication 29 A (9); Indication Ribera

## 2020-12-21 NOTE — Discharge Instructions (Signed)
Radial Site Care  This sheet gives you information about how to care for yourself after your procedure. Your health care provider may also give you more specific instructions. If you have problems or questions, contact your health care provider. What can I expect after the procedure? After the procedure, it is common to have:  Bruising and tenderness at the catheter insertion area. Follow these instructions at home: Medicines  Take over-the-counter and prescription medicines only as told by your health care provider. Insertion site care  Follow instructions from your health care provider about how to take care of your insertion site. Make sure you: ? Wash your hands with soap and water before you change your bandage (dressing). If soap and water are not available, use hand sanitizer. ? Change your dressing as told by your health care provider. ? Leave stitches (sutures), skin glue, or adhesive strips in place. These skin closures may need to stay in place for 2 weeks or longer. If adhesive strip edges start to loosen and curl up, you may trim the loose edges. Do not remove adhesive strips completely unless your health care provider tells you to do that.  Check your insertion site every day for signs of infection. Check for: ? Redness, swelling, or pain. ? Fluid or blood. ? Pus or a bad smell. ? Warmth.  Do not take baths, swim, or use a hot tub until your health care provider approves.  You may shower 24-48 hours after the procedure, or as directed by your health care provider. ? Remove the dressing and gently wash the site with plain soap and water. ? Pat the area dry with a clean towel. ? Do not rub the site. That could cause bleeding.  Do not apply powder or lotion to the site. Activity  For 24 hours after the procedure, or as directed by your health care provider: ? Do not flex or bend the affected arm. ? Do not push or pull heavy objects with the affected arm. ? Do not drive  yourself home from the hospital or clinic. You may drive 24 hours after the procedure unless your health care provider tells you not to. ? Do not operate machinery or power tools.  Do not lift anything that is heavier than 10 lb (4.5 kg), or the limit that you are told, until your health care provider says that it is safe.  Ask your health care provider when it is okay to: ? Return to work or school. ? Resume usual physical activities or sports. ? Resume sexual activity.   General instructions  If the catheter site starts to bleed, raise your arm and put firm pressure on the site. If the bleeding does not stop, get help right away. This is a medical emergency.  If you went home on the same day as your procedure, a responsible adult should be with you for the first 24 hours after you arrive home.  Keep all follow-up visits as told by your health care provider. This is important. Contact a health care provider if:  You have a fever.  You have redness, swelling, or yellow drainage around your insertion site. Get help right away if:  You have unusual pain at the radial site.  The catheter insertion area swells very fast.  The insertion area is bleeding, and the bleeding does not stop when you hold steady pressure on the area.  Your arm or hand becomes pale, cool, tingly, or numb. These symptoms may represent a serious   problem that is an emergency. Do not wait to see if the symptoms will go away. Get medical help right away. Call your local emergency services (911 in the [place] Do not drive yourself to the hospital. Summary  After the procedure, it is common to have bruising and tenderness at the site.  Follow instructions from your health care provider about how to take care of your radial site wound. Check the wound every day for signs of infection.  Do not lift anything that is heavier than 10 lb (4.5 kg), or the limit that you are told, until your health care provider says that it  is safe. This information is not intended to replace advice given to you by your health care provider. Make sure you discuss any questions you have with your health care provider. Document Revised: 09/18/2017 Document Reviewed: 09/18/2017 Elsevier Patient Education  2021 [employer]  

## 2020-12-21 NOTE — H&P (Signed)
OV 12/15/20 copied for documentation    Date:  12/15/2020   ID:  Devyn Sheerin, DOB [birth year], MRN [id number]  PCP:  Malena Peer, MD  Cardiologist:  Nigel Mormon, DO, [facility] (established care 11/25/2020) Former Cardiology Providers: Dr. Lyman Bishop   Date: 12/15/2020 Last Office Visit: 11/25/2020  No chief complaint on file.   HPI  Eugene Mccullough is a 60 y.o. African-American male who presents to the office with a chief complaint of " reevaluation of chest pain and review diagnostic testing." Patient's past medical history and cardiovascular risk factors include: HTN, HLD, BPH, mild coronary artery calcification, 26-pack-year history of smoking, quit in 2014, obesity due to excess calories.  He is referred to the office at the request of [name], [name], MD for evaluation of chest pain.  During initial consultation patient describes chest pain suggestive of angina pectoris ongoing for the last 3 years.  Patient states that he underwent stress testing which was overall a low risk study and therefore no additional testing was performed.  However, due to his symptoms the plan was to undergo coronary CTA and an echocardiogram.  Patient is asked to come to the office sooner than his scheduled office visit due to high risk findings on the coronary CTA.  At today's office visit patient does not have chest pain.  His last episode of chest pain was last week while doing effort related activities, symptoms lasted for about 2 to 3 minutes, improves with rest.  He has not required sublingual nitroglycerin tablets.  And he has not started aspirin as requested at the last office visit.  No family history of premature coronary disease or sudden cardiac death.  FUNCTIONAL STATUS: No structured exercise program but tries to keep himself active with washing cars, taking care of his yard, and doing work around the house.  ALLERGIES: Allergies  Allergen Reactions  . Hydrocodone-Acetaminophen  Itching    MEDICATION LIST PRIOR TO VISIT: Current Meds  Medication Sig  . aspirin EC 81 MG tablet Take 1 tablet (81 mg total) by mouth daily. Swallow whole.  Marland Kitchen atorvastatin (LIPITOR) 80 MG tablet Take 1 tablet (80 mg total) by mouth at bedtime.  . calcium carbonate (TUMS - DOSED IN MG ELEMENTAL CALCIUM) 500 MG chewable tablet Chew 1,000 mg by mouth daily as needed for indigestion or heartburn.  . finasteride (PROSCAR) 5 MG tablet TAKE 1 TABLET BY MOUTH EVERY DAY (Patient taking differently: Take 5 mg by mouth daily.)  . hydrOXYzine (ATARAX/VISTARIL) 25 MG tablet Take 25 mg by mouth 3 (three) times daily as needed for itching.  . losartan (COZAAR) 50 MG tablet Take 25 mg by mouth daily.  . metoprolol tartrate (LOPRESSOR) 50 MG tablet Take 1 tablet (50 mg total) by mouth 2 (two) times daily.  . pantoprazole (PROTONIX) 40 MG tablet Take 40 mg by mouth 2 (two) times daily.  . tamsulosin (FLOMAX) 0.4 MG CAPS capsule Take 0.4 mg by mouth daily.  Marland Kitchen triamcinolone cream (KENALOG) 0.1 % Apply 1 application topically 2 (two) times daily.  . [DISCONTINUED] nitroGLYCERIN (NITROSTAT) 0.4 MG SL tablet Place 1 tablet (0.4 mg total) under the tongue every 5 (five) minutes as needed for chest pain. If you require more than two tablets five minutes apart go to the nearest ER via EMS.     PAST MEDICAL HISTORY: Past Medical History:  Diagnosis Date  . Anemia   . BPH (benign prostatic hyperplasia)   . Eczema   . Gastric ulcer   .  GERD (gastroesophageal reflux disease)   . Gout   . H. pylori infection   . Hyperlipidemia   . Hypertension   . IBS (irritable bowel syndrome)   . Sleep apnea    Does not use CPAP    PAST SURGICAL HISTORY: Past Surgical History:  Procedure Laterality Date  . CHOLECYSTECTOMY  2005  . COLONOSCOPY  08/2010  . UPPER GI ENDOSCOPY  2019  . WISDOM TOOTH EXTRACTION      FAMILY HISTORY: The patient family history includes Diabetes in his brother and sister; Eating disorder  in his daughter; Eczema in his son; Hyperlipidemia in his mother; Hypertension in his sister; Irritable bowel syndrome in his son; Obesity in his sister.  SOCIAL HISTORY:  The patient  reports that he has quit smoking. His smoking use included cigarettes. He has a 20.00 pack-year smoking history. He has never used smokeless tobacco. He reports that he does not drink alcohol and does not use drugs.  REVIEW OF SYSTEMS: Review of Systems  Constitutional: Negative for chills and fever.  HENT: Negative for hoarse voice and nosebleeds.   Eyes: Negative for discharge, double vision and pain.  Cardiovascular: Positive for chest pain. Negative for claudication, dyspnea on exertion, leg swelling, near-syncope, orthopnea, palpitations, paroxysmal nocturnal dyspnea and syncope.  Respiratory: Negative for hemoptysis and shortness of breath.   Musculoskeletal: Negative for muscle cramps and myalgias.  Gastrointestinal: Positive for heartburn. Negative for abdominal pain, constipation, diarrhea, hematemesis, hematochezia, melena, nausea and vomiting.  Neurological: Negative for dizziness and light-headedness.    PHYSICAL EXAM: Vitals with BMI 12/21/2020 12/15/2020 12/14/2020  Height '5\' 7"'  '5\' 7"'  -  Weight 203 lbs 204 lbs 10 oz -  BMI 16.10 96.04 -  Systolic 540 981 191  Diastolic 84 74 71  Pulse 59 68 -    CONSTITUTIONAL: Well-developed and well-nourished. No acute distress.  SKIN: Skin is warm and dry. No rash noted. No cyanosis. No pallor. No jaundice HEAD: Normocephalic and atraumatic.  EYES: No scleral icterus MOUTH/THROAT: Moist oral membranes.  NECK: No JVD present. No thyromegaly noted. No carotid bruits  LYMPHATIC: No visible cervical adenopathy.  CHEST Normal respiratory effort. No intercostal retractions  LUNGS: Clear to auscultation bilaterally.  No stridor. No wheezes. No rales.  CARDIOVASCULAR: Regular, positive S1-S2, no murmurs rubs or gallops appreciated ABDOMINAL: No apparent  ascites.  EXTREMITIES: No peripheral edema  HEMATOLOGIC: No significant bruising NEUROLOGIC: Oriented to person, place, and time. Nonfocal. Normal muscle tone.  PSYCHIATRIC: Normal mood and affect. Normal behavior. Cooperative  CARDIAC DATABASE: EKG: 11/25/2020: Sinus tachycardia, 101 bpm, left axis deviation, poor R wave progression, old inferior infarct.    Echocardiogram: 12/13/2020: Normal LV systolic function with visual EF 50-55%. Left ventricle cavity is normal in size. Mild left ventricular hypertrophy. Normal global wall motion. Indeterminate diastolic filling pattern, normal LAP. Left atrial cavity is severely dilated. Mild (Grade I) mitral regurgitation. Mild tricuspid regurgitation. No evidence of pulmonary hypertension. Mild pulmonic regurgitation. IVC is dilated with a respiratory response of >50%. No prior study for comparison.    Stress Testing: Nuclear stress test 05/01/2017:  The left ventricular ejection fraction is normal (55-65%).  Nuclear stress EF: 59%.  Blood pressure demonstrated a normal response to exercise.  There was no ST segment deviation noted during stress.  Defect 1: There is a small defect of moderate severity present in the basal inferior location.  This is a low risk study.   Low risk stress nuclear study with probable inferobasal thinning and no  significant ischemia; EF 59 with normal wall motion.  Coronary CTA 12/14/2020: 1. Coronary calcium score of 24.6 AU. This was 70th percentile for age and sex matched control. 2. Normal coronary origin with right dominance. 3. CAD-RADS = 4. Severe stenosis (70-99%) at the proximal /mid LAD due to mixed plaque. First diagonal branch patent but diffuse disease within the ostial to proximal segment. Second diagonal branch is overall patent. Diffuse disease within the proximal to mid segment LCx. RCA without evidence of plaque or stenosis. 4. Study is sent for CT-FFR to further evaluate the LAD, D1,  and LCx. Findings will be performed and reported separately.  Noncardiac impressions: 1. No acute findings in the imaged extracardiac chest. 2. Moderate hiatal hernia. 3. Mild hepatic steatosis. 4. Aortic Atherosclerosis (ICD10-I70.0).  Coronary CT FFR 12/15/2020: CT FFR analysis showed significant stenosis at mid LAD (modeled as total occlusion), mid-to-distal LCX, and acute marginal modeled as total occlusion.  Heart Catheterization: None  LABORATORY DATA: CBC Latest Ref Rng & Units 12/19/2020 08/14/2018 07/02/2018  WBC 3.4 - 10.8 x10E3/uL 5.0 5.9 5.5  Hemoglobin 13.0 - 17.7 g/dL 14.2 15.1 14.6  Hematocrit 37.5 - 51.0 % 41.6 44.5 42.1  Platelets 150 - 450 x10E3/uL 163 141.0(L) 145.0(L)    CMP Latest Ref Rng & Units 12/19/2020 12/12/2020 08/14/2018  Glucose 65 - 99 mg/dL 106(H) 95 90  BUN 8 - 27 mg/dL '10 12 20  ' Creatinine 0.76 - 1.27 mg/dL 1.27 1.13 1.16  Sodium 134 - 144 mmol/L 139 138 137  Potassium 3.5 - 5.2 mmol/L 4.9 4.7 4.7  Chloride 96 - 106 mmol/L 103 102 102  CO2 20 - 29 mmol/L '23 21 28  ' Calcium 8.6 - 10.2 mg/dL 9.8 9.8 10.1  Total Protein 6.0 - 8.5 g/dL 7.7 - 8.3  Total Bilirubin 0.0 - 1.2 mg/dL 0.6 - 0.5  Alkaline Phos 44 - 121 IU/L 101 - 72  AST 0 - 40 IU/L 27 - 26  ALT 0 - 44 IU/L 32 - 44    Lipid Panel  Lab Results  Component Value Date   CHOL 113 12/19/2020   HDL 29 (L) 12/19/2020   LDLCALC 67 12/19/2020   LDLDIRECT 68 12/19/2020   TRIG 88 12/19/2020   CHOLHDL 4 07/02/2018    No components found for: NTPROBNP Recent Labs    12/19/20 1122  PROBNP 99   No results for input(s): TSH in the last 8760 hours.  BMP Recent Labs    12/12/20 1419 12/19/20 1122  NA 138 139  K 4.7 4.9  CL 102 103  CO2 21 23  GLUCOSE 95 106*  BUN 12 10  CREATININE 1.13 1.27  CALCIUM 9.8 9.8    HEMOGLOBIN A1C Lab Results  Component Value Date   HGBA1C 5.9 02/25/2017   External Labs: Collected: 11/09/2020, received from PCP Hemoglobin 15.8 g/dL, hematocrit  48% Platelets 143 Creatinine 0.93 mg/dL. eGFR: 103 mL/min per 1.73 m Potassium 4.9 AST 23, ALT 40, alkaline phosphatase 108 Lipid profile: Total cholesterol 191, triglycerides 91, HDL 47, LDL 125, non-HDL 144 Hemoglobin A1c: 5.3 TSH: 1.07  IMPRESSION:  No diagnosis found.   RECOMMENDATIONS: Eugene Mccullough is a 60 y.o. male whose past medical history and cardiac risk factors include:  HTN, HLD, BPH, mild coronary artery calcification, 26-pack-year history of smoking, quit in 2014, obesity due to excess calories.  Atherosclerosis of the native coronary arteries with stable angina pectoris:  Reviewed the results of the echocardiogram and coronary CTA with FFR results with both  the patient and his wife at today's visit.  No active chest pain at the time of the evaluation.  Last episode of chest pain was last week.  Patient is asked to seek medical attention sooner if he has recurrence of his chest pain as a coronary CTA is concerning for multivessel CAD.  Patient verbalizes understanding and provides verbal feedback.  Reemphasized the importance of starting aspirin 81 mg p.o. daily.  Will start Lipitor 80 mg p.o. nightly  Continue Lopressor.  Reemphasized the use of nitroglycerin tablets on as needed basis  Will check fasting lipid profile, CMP, CBC, magnesium level, and COVID screen prior to upcoming angiography  The procedure of left heart catheterization with possible intervention was explained to the patient and his wife in detail.   The indication, alternatives, risks and benefits were reviewed.   Complications include but not limited to bleeding, infection, vascular injury, stroke, myocardial infection, arrhythmia, kidney injury requiring hemodialysis, radiation-related injury in the case of prolonged fluoroscopy use, emergency cardiac surgery, and death. The patient understands the risks of serious complication is 1-2 in 1601 with diagnostic cardiac cath and 1-2% or less  with angioplasty/stenting.   The patient and his wife voices understanding and provides verbal feedback and wishes to proceed with coronary angiography with possible PCI.  Of note, reviewed the results of the coronary CTA including the images with both the patient and his wife at today's office visit.  Coronary artery calcification: Continue aspirin and statin therapy  Benign essential hypertension:  Office blood pressures well controlled  Medications reconciled.  Patient is asked to keep a log of his blood pressures and to review it with either his PCP or myself at the next office visit.  Low-salt diet recommended.  Mixed hyperlipidemia:  Given concern for multivessel CAD and upcoming left heart catheterization we will increase his Lipitor to 80 mg p.o. nightly.   Educated on importance of dietary restrictions.  Does not endorse any myalgias.  Former smoker: Educated on the importance of continued smoking cessation.  FINAL MEDICATION LIST END OF ENCOUNTER: Meds ordered this encounter  Medications  . sodium chloride flush (NS) 0.9 % injection 3 mL  . sodium chloride flush (NS) 0.9 % injection 3 mL  . 0.9 %  sodium chloride infusion  . aspirin chewable tablet 81 mg  . FOLLOWED BY Linked Order Group   . 0.9% sodium chloride infusion   . 0.9% sodium chloride infusion    Medications Discontinued During This Encounter  Medication Reason  . hydrOXYzine (ATARAX/VISTARIL) 10 MG tablet Dose change  . alclomethasone (ACLOVATE) 0.05 % cream Completed Course     Current Facility-Administered Medications:  .  0.9 %  sodium chloride infusion, 250 mL, Intravenous, PRN, Sole Lengacher J, MD .  0.9% sodium chloride infusion, 3 mL/kg/hr, Intravenous, Continuous **FOLLOWED BY** 0.9% sodium chloride infusion, 1 mL/kg/hr, Intravenous, Continuous, Nikyah Lackman J, MD .  Derrill Memo ON 12/22/2020] aspirin chewable tablet 81 mg, 81 mg, Oral, Pre-Cath, Jaimya Feliciano J, MD .  sodium  chloride flush (NS) 0.9 % injection 3 mL, 3 mL, Intravenous, Q12H, Cyan Clippinger J, MD .  sodium chloride flush (NS) 0.9 % injection 3 mL, 3 mL, Intravenous, PRN, Kannon Baum J, MD  Orders Placed This Encounter  Procedures  . Diet NPO time specified  . Informed Consent Details: Physician/Practitioner Attestation; Transcribe to consent form and obtain patient signature  . Confirm CBC and BMP (or CMP) results within 7 days for inpatient and 30 days for outpatient:  Outpatients with severe anemia (hgb<10, CKD, severe thrombocytopenia plts<100) labs should be within 10 days. Only draw PT/INR on patients that are on Coumadin, Hgb<10, have liver disease (cirrhosis, liver CA, hepatitis, etc). Urine pregnancy test within hospital admission for inpatients of child bearing age, for outpatients day of procedure.  . Confirm EKG performed within 30 days for cardiac procedures and 12 months for peripheral vascular procedures.  Place order for EKG if missing or not within timeframe.  . Verify aspirin and / or anti-platelet medication (Plavix, Effient, Brilinta) dose available for cardiac / peripheral vascular procedure day. IF ordered daily / once, adjust schedule to administer before procedure.  . Weigh patient  . Initiate Cath/PCI clinical path; encourage patient to watch CCTV video  . Clip R radial (no IV/bracelet R wrist)  . Clip R groin  . Insert peripheral IV  . Insert 2nd peripheral IV site-Saline lock IV    There are no outpatient Patient Instructions on file for this admission.   --Continue cardiac medications as reconciled in final medication list. --No follow-ups on file. Or sooner if needed. --Continue follow-up with your primary care physician regarding the management of your other chronic comorbid conditions.  Patient's questions and concerns were addressed to his satisfaction. He voices understanding of the instructions provided during this encounter.   Total time spent: 45  minutes discussing the results of the coronary CTA, echocardiogram, coronary CT FFR results.  Reevaluation of symptoms.  Encouraged him to go to the hospital for more expedited evaluation; however, patient states that he is currently asymptomatic and would like to do it electively.  However he understands that if his symptoms increase in intensity, frequency, and/or duration he will seek medical attention by going to the closest ER via EMS.  Discussing disease management, obtaining consent for upcoming heart catheterization, ordering lab work, and coordination of care.  This note was created using a voice recognition software as a result there may be grammatical errors inadvertently enclosed that do not reflect the nature of this encounter. Every attempt is made to correct such errors.  Rex Kras, [place], [facility]  Pager: [phone] Office: [phone]

## 2020-12-22 ENCOUNTER — Other Ambulatory Visit: Payer: Self-pay | Admitting: Cardiology

## 2020-12-22 ENCOUNTER — Encounter (HOSPITAL_COMMUNITY): Payer: Self-pay | Admitting: Cardiology

## 2020-12-22 DIAGNOSIS — I25118 Atherosclerotic heart disease of native coronary artery with other forms of angina pectoris: Secondary | ICD-10-CM

## 2020-12-23 ENCOUNTER — Ambulatory Visit: Payer: 59 | Admitting: Cardiology

## 2020-12-27 ENCOUNTER — Other Ambulatory Visit: Payer: Self-pay

## 2020-12-27 ENCOUNTER — Encounter: Payer: Self-pay | Admitting: Thoracic Surgery (Cardiothoracic Vascular Surgery)

## 2020-12-27 ENCOUNTER — Institutional Professional Consult (permissible substitution): Payer: 59 | Admitting: Thoracic Surgery (Cardiothoracic Vascular Surgery)

## 2020-12-27 ENCOUNTER — Encounter: Payer: 59 | Admitting: Thoracic Surgery (Cardiothoracic Vascular Surgery)

## 2020-12-27 VITALS — BP 124/77 | HR 83 | Resp 20

## 2020-12-27 DIAGNOSIS — I2510 Atherosclerotic heart disease of native coronary artery without angina pectoris: Secondary | ICD-10-CM | POA: Diagnosis not present

## 2020-12-27 NOTE — Progress Notes (Signed)
[address]       [place] [address]             [phone]        [name] [place] Medical Record #[id number] Date of Birth: 02/24/1961  Referring: Eugene Peer, Eugene Mccullough Primary Care: Eugene Mccullough Eugene Shaggy, Eugene Mccullough Primary Cardiologist:None  Chief Complaint:    Chief Complaint  Patient presents with  . Coronary Artery Disease    Initial surgical consult, cath 4/27, CT corn 4/20    History of Present Illness:     Mr. Eugene Mccullough is a 60 year old gentleman presents for surgical evaluation of two-vessel coronary artery disease.  He is followed by Dr. Terri Mccullough and has had exertional anginal symptoms for about 3 years, but underwent a stress test which was very low risk back in 2018.  Over the past several months he has noticed more anginal symptoms with exertion, but states that they are relieved with about 10 minutes of rest.  He has been underwent a CTA which had significant findings in the head and a left heart cath which showed a completely occluded LAD as well as circumflex disease.  In regards to his symptoms he states that his angina is elicited with exertion.  He denies any orthopnea.  There was a question of some lower extremity edema but this was over 30 years ago.  On review of systems she does complain of some reflux.   Past Medical and Surgical History: Previous Chest Surgery: No Previous Chest Radiation: No Diabetes Mellitus: No.  HbA1C pending Creatinine: 1.27  Past Medical History:  Diagnosis Date  . Anemia   . BPH (benign prostatic hyperplasia)   . Eczema   . Gastric ulcer   . GERD (gastroesophageal reflux disease)   . Gout   . H. pylori infection   . Hyperlipidemia   . Hypertension   . IBS (irritable bowel syndrome)   . Sleep apnea    Does not use CPAP    Past Surgical History:  Procedure Laterality Date  . CHOLECYSTECTOMY  2005  . COLONOSCOPY  08/2010  . LEFT HEART CATH AND CORONARY ANGIOGRAPHY N/A 12/21/2020   Procedure: LEFT HEART  CATH AND CORONARY ANGIOGRAPHY;  Surgeon: Eugene Mormon, Eugene Mccullough;  Location: [facility];  Service: Cardiovascular;  Laterality: N/A;  . UPPER GI ENDOSCOPY  2019  . WISDOM TOOTH EXTRACTION       Social History   Tobacco Use  Smoking Status Former Smoker  . Packs/day: 1.00  . Years: 20.00  . Pack years: 20.00  . Types: Cigarettes  Smokeless Tobacco Never Used    Social History   Substance and Sexual Activity  Alcohol Use No  . Alcohol/week: 0.0 standard drinks     Allergies  Allergen Reactions  . Hydrocodone-Acetaminophen Itching     Current Outpatient Medications  Medication Sig Dispense Refill  . aspirin EC 81 MG tablet Take 1 tablet (81 mg total) by mouth daily. Swallow whole. 90 tablet 3  . atorvastatin (LIPITOR) 80 MG tablet Take 1 tablet (80 mg total) by mouth at bedtime. 90 tablet 0  . calcium carbonate (TUMS - DOSED IN MG ELEMENTAL CALCIUM) 500 MG chewable tablet Chew 1,000 mg by mouth daily as needed for indigestion or heartburn.    . finasteride (PROSCAR) 5 MG tablet TAKE 1 TABLET BY MOUTH EVERY DAY (Patient taking differently: Take 5 mg by mouth daily.) 90 tablet 1  . hydrOXYzine (ATARAX/VISTARIL) 25 MG tablet  Take 25 mg by mouth 3 (three) times daily as needed for itching.    . losartan (COZAAR) 50 MG tablet Take 25 mg by mouth daily.    . metoprolol tartrate (LOPRESSOR) 50 MG tablet Take 1 tablet (50 mg total) by mouth 2 (two) times daily. 180 tablet 0  . nitroGLYCERIN (NITROSTAT) 0.4 MG SL tablet Place 1 tablet (0.4 mg total) under the tongue every 5 (five) minutes as needed for chest pain. If you require more than two tablets five minutes apart go to the nearest ER via EMS. 25 tablet 0  . pantoprazole (PROTONIX) 40 MG tablet Take 40 mg by mouth 2 (two) times daily.    . tamsulosin (FLOMAX) 0.4 MG CAPS capsule Take 0.4 mg by mouth daily.    Marland Kitchen triamcinolone cream (KENALOG) 0.1 % Apply 1 application topically 2 (two) times daily.     No current  facility-administered medications for this visit.    (Not in a hospital admission)   Family History  Problem Relation Age of Onset  . Diabetes Brother   . Hyperlipidemia Mother   . Obesity Sister   . Hypertension Sister   . Diabetes Sister   . Eating disorder Daughter   . Eczema Son   . Irritable bowel syndrome Son   . Cancer Neg Hx   . Early death Neg Hx   . Heart disease Neg Hx   . Kidney disease Neg Hx   . Stroke Neg Hx   . Alcohol abuse Neg Hx   . Colon cancer Neg Hx   . Esophageal cancer Neg Hx   . Stomach cancer Neg Hx   . Rectal cancer Neg Hx      Review of Systems:   Review of Systems  Constitutional: Positive for malaise/fatigue.  Respiratory: Positive for shortness of breath.   Cardiovascular: Positive for chest pain. Negative for palpitations, orthopnea, leg swelling and PND.  Gastrointestinal: Positive for heartburn.  Genitourinary: Positive for frequency and urgency.  Musculoskeletal: Negative.   Neurological: Positive for tingling.      Physical Exam: BP 124/77 (BP Location: Right Arm, Patient Position: Sitting)   Pulse 83   Resp 20 Comment: RA  SpO2 98%  Physical Exam Constitutional:      General: He is not in acute distress.    Appearance: Normal appearance. He is normal weight. He is not ill-appearing or toxic-appearing.  HENT:     Head: Normocephalic and atraumatic.  Eyes:     Extraocular Movements: Extraocular movements intact.  Cardiovascular:     Rate and Rhythm: Normal rate and regular rhythm.     Pulses: Normal pulses.     Heart sounds: Normal heart sounds.     Comments: Allen's test showed good filling of the pulm Abdominal:     General: There is no distension.  Skin:    General: Skin is warm and dry.  Neurological:     General: No focal deficit present.     Mental Status: He is alert and oriented to person, place, and time.       Diagnostic Studies & Laboratory data:    Left Heart Catherization: Two-vessel coronary  artery disease with filling of the distal LAD via left to left and right to left collaterals.  His vessels are small suitable for surgical bypass. Echo: Echo report 4/19 reviewed LV function 50 to 55%. Left atrium was severely dilated. Mild mitral valve regurgitation.   I have independently reviewed the above radiologic studies and discussed with  the patient   Recent Lab Findings: Lab Results  Component Value Date   WBC 5.0 12/19/2020   HGB 14.2 12/19/2020   HCT 41.6 12/19/2020   PLT 163 12/19/2020   GLUCOSE 106 (H) 12/19/2020   CHOL 113 12/19/2020   TRIG 88 12/19/2020   HDL 29 (L) 12/19/2020   LDLDIRECT 68 12/19/2020   LDLCALC 67 12/19/2020   ALT 32 12/19/2020   AST 27 12/19/2020   NA 139 12/19/2020   K 4.9 12/19/2020   CL 103 12/19/2020   CREATININE 1.27 12/19/2020   BUN 10 12/19/2020   CO2 23 12/19/2020   TSH 0.78 07/02/2018   HGBA1C 5.9 02/25/2017      Assessment / Plan:   60 year old male with significant two-vessel coronary artery disease he has preserved LV function and no significant valvular disease.  We had a very long discussion about the risks and benefits of surgical stabilization.  The patient was hesitant to proceed.  He is under the impression that that if he loses some weight and exercise more this will reverse the blockages that are currently present.  I explained to him and his wife that this would not be possible.  Their daughter is getting married in 6 weeks, and I explained that he able to recover from the operation within time to fully participate.  He and his wife would like to come back in 1 week to discuss surgical timing.    I  spent 55 minutes counseling the patient face to face.   Lajuana Matte 12/27/2020 4:10 PM

## 2021-01-02 ENCOUNTER — Other Ambulatory Visit: Payer: Self-pay | Admitting: *Deleted

## 2021-01-02 ENCOUNTER — Encounter: Payer: Self-pay | Admitting: *Deleted

## 2021-01-02 ENCOUNTER — Telehealth: Payer: Self-pay | Admitting: Cardiology

## 2021-01-02 DIAGNOSIS — I2510 Atherosclerotic heart disease of native coronary artery without angina pectoris: Secondary | ICD-10-CM

## 2021-01-02 NOTE — Telephone Encounter (Signed)
Telephone encounter  Patient requested a phone call to discuss further management of his CAD.  Spoke to the patient at length as he questions if diet and medications will compeletly reverse the multivessel CAD.   Based on his symptoms, noninvasive as well as left heart catheterization findings recommended surgical revascularization in addition to GDMT and heart healthy habits and lifestyle changes.  Patient has already seen Dr. [name] in consultation.  Given his hesitation with regards to whether he should continue with medical management versus surgical revascularization I have offered him to consider a second opinion with either a cardiologist or cardiothoracic surgeon.   Patient states that he will discuss it further with family and will call us back.  In the interim patient is encouraged to seek medical attention sooner if his symptoms of chest pain increases in intensity, frequency, and/or duration by going to the closest ER via EMS.  Rex Kras, [place], [facility]  Pager: [phone] Office: [phone]

## 2021-01-03 DIAGNOSIS — D649 Anemia, unspecified: Secondary | ICD-10-CM | POA: Insufficient documentation

## 2021-01-03 DIAGNOSIS — G4730 Sleep apnea, unspecified: Secondary | ICD-10-CM | POA: Insufficient documentation

## 2021-01-03 DIAGNOSIS — N400 Enlarged prostate without lower urinary tract symptoms: Secondary | ICD-10-CM | POA: Insufficient documentation

## 2021-01-03 DIAGNOSIS — E785 Hyperlipidemia, unspecified: Secondary | ICD-10-CM | POA: Insufficient documentation

## 2021-01-03 DIAGNOSIS — K219 Gastro-esophageal reflux disease without esophagitis: Secondary | ICD-10-CM | POA: Insufficient documentation

## 2021-01-03 DIAGNOSIS — L309 Dermatitis, unspecified: Secondary | ICD-10-CM | POA: Insufficient documentation

## 2021-01-03 DIAGNOSIS — K589 Irritable bowel syndrome without diarrhea: Secondary | ICD-10-CM | POA: Insufficient documentation

## 2021-01-05 ENCOUNTER — Ambulatory Visit: Payer: 59 | Admitting: Cardiology

## 2021-01-05 ENCOUNTER — Encounter: Payer: Self-pay | Admitting: Cardiology

## 2021-01-05 ENCOUNTER — Ambulatory Visit (INDEPENDENT_AMBULATORY_CARE_PROVIDER_SITE_OTHER): Payer: 59 | Admitting: Cardiology

## 2021-01-05 ENCOUNTER — Other Ambulatory Visit: Payer: Self-pay

## 2021-01-05 VITALS — BP 102/66 | HR 67 | Temp 98.1°F | Resp 16 | Ht 67.0 in | Wt 197.4 lb

## 2021-01-05 VITALS — BP 108/68 | HR 58 | Ht 67.0 in | Wt 196.4 lb

## 2021-01-05 DIAGNOSIS — I2510 Atherosclerotic heart disease of native coronary artery without angina pectoris: Secondary | ICD-10-CM | POA: Diagnosis not present

## 2021-01-05 DIAGNOSIS — E669 Obesity, unspecified: Secondary | ICD-10-CM

## 2021-01-05 DIAGNOSIS — E782 Mixed hyperlipidemia: Secondary | ICD-10-CM

## 2021-01-05 DIAGNOSIS — I10 Essential (primary) hypertension: Secondary | ICD-10-CM

## 2021-01-05 DIAGNOSIS — Z87891 Personal history of nicotine dependence: Secondary | ICD-10-CM

## 2021-01-05 NOTE — Progress Notes (Signed)
Date:  12/15/2020   ID:  Eugene Mccullough, DOB [birth year], MRN [id number]  PCP:  Malena Peer, MD  Cardiologist:  Rex Kras, DO, [facility] (established care 11/25/2020) Former Cardiology Providers: Dr. Lyman Bishop   Date: 01/05/21 Last Office Visit: 12/15/2020  Chief Complaint  Patient presents with  . Post cath   . Follow-up    HPI  Eugene Mccullough is a 60 y.o. African-American male who presents to the office with a chief complaint of " post heart catheterization follow-up." Patient's past medical history and cardiovascular risk factors include: Multivessel CAD, HTN, HLD, BPH, mild coronary artery calcification, 26-pack-year history of smoking, quit in 2014, obesity due to excess calories.  He is referred to the office at the request of [name], [name], MD for evaluation of chest pain.  During initial consultation patient described chest pain consistent with angina pectoris ongoing for the last 3 years.  Patient states that he underwent stress testing which was overall a low risk study and therefore no additional testing was performed.  However, due to his symptoms he was recommended to undergo coronary CTA and an echocardiogram.  Patient was noted to have obstructive CAD on coronary CTA which were hemodynamically significant per CT FFR.  He subsequently underwent a left heart catheterization which reconfirmed multivessel CAD was referred to Dr. [name] for CABG evaluation.  Initially he was hesitant to undergo surgery; however, after discussing it with family and doing his own reading he has decided to proceed with CABG. He now scheduled for surgery on May 17th 2022 and has CT surgery office visit tomorrow.  He has not had recurrence of chest pain over the last 24-48 hours and has lost since the last office visit.   No family history of premature coronary disease or sudden cardiac death.  FUNCTIONAL STATUS: No structured exercise program but tries to keep himself active with  washing cars, taking care of his yard, and doing work around the house.  ALLERGIES: Allergies  Allergen Reactions  . Hydrocodone-Acetaminophen Itching    MEDICATION LIST PRIOR TO VISIT: Current Meds  Medication Sig  . aspirin EC 81 MG tablet Take 1 tablet (81 mg total) by mouth daily. Swallow whole.  Marland Kitchen atorvastatin (LIPITOR) 80 MG tablet Take 1 tablet (80 mg total) by mouth at bedtime.  . finasteride (PROSCAR) 5 MG tablet TAKE 1 TABLET BY MOUTH EVERY DAY  . hydrOXYzine (ATARAX/VISTARIL) 25 MG tablet Take 25 mg by mouth 3 (three) times daily as needed for itching.  . losartan (COZAAR) 50 MG tablet Take 25 mg by mouth daily.  . metoprolol tartrate (LOPRESSOR) 50 MG tablet Take 1 tablet (50 mg total) by mouth 2 (two) times daily.  . nitroGLYCERIN (NITROSTAT) 0.4 MG SL tablet Place 1 tablet (0.4 mg total) under the tongue every 5 (five) minutes as needed for chest pain. If you require more than two tablets five minutes apart go to the nearest ER via EMS.  . pantoprazole (PROTONIX) 40 MG tablet Take 40 mg by mouth 2 (two) times daily.  . tamsulosin (FLOMAX) 0.4 MG CAPS capsule Take 0.4 mg by mouth daily.     PAST MEDICAL HISTORY: Past Medical History:  Diagnosis Date  . Anemia   . BPH (benign prostatic hyperplasia)   . Eczema   . Gastric ulcer   . GERD (gastroesophageal reflux disease)   . Gout   . H. pylori infection   . Hyperlipidemia   . Hypertension   . IBS (irritable bowel syndrome)   .  Sleep apnea    Does not use CPAP    PAST SURGICAL HISTORY: Past Surgical History:  Procedure Laterality Date  . CHOLECYSTECTOMY  2005  . COLONOSCOPY  08/2010  . LEFT HEART CATH AND CORONARY ANGIOGRAPHY N/A 12/21/2020   Procedure: LEFT HEART CATH AND CORONARY ANGIOGRAPHY;  Surgeon: Nigel Mormon, MD;  Location: [facility];  Service: Cardiovascular;  Laterality: N/A;  . UPPER GI ENDOSCOPY  2019  . WISDOM TOOTH EXTRACTION      FAMILY HISTORY: The patient family history  includes Diabetes in his brother and sister; Eating disorder in his daughter; Eczema in his son; Hyperlipidemia in his mother; Hypertension in his sister; Irritable bowel syndrome in his son; Obesity in his sister.  SOCIAL HISTORY:  The patient  reports that he has quit smoking. His smoking use included cigarettes. He has a 20.00 pack-year smoking history. He has never used smokeless tobacco. He reports that he does not drink alcohol and does not use drugs.  REVIEW OF SYSTEMS: Review of Systems  Constitutional: Negative for chills and fever.  HENT: Negative for hoarse voice and nosebleeds.   Eyes: Negative for discharge, double vision and pain.  Cardiovascular: Positive for chest pain. Negative for claudication, dyspnea on exertion, leg swelling, near-syncope, orthopnea, palpitations, paroxysmal nocturnal dyspnea and syncope.  Respiratory: Negative for hemoptysis and shortness of breath.   Musculoskeletal: Negative for muscle cramps and myalgias.  Gastrointestinal: Positive for heartburn. Negative for abdominal pain, constipation, diarrhea, hematemesis, hematochezia, melena, nausea and vomiting.  Neurological: Negative for dizziness and light-headedness.   PHYSICAL EXAM: Vitals with BMI 01/05/2021 01/05/2021 12/27/2020  Height _0  _1  -  Weight 196 lbs 6 oz 197 lbs 6 oz -  BMI 41.28 78.67 -  Systolic 672 094 709  Diastolic 68 66 77  Pulse 58 67 83    CONSTITUTIONAL: Well-developed and well-nourished. No acute distress.  SKIN: Skin is warm and dry. No rash noted. No cyanosis. No pallor. No jaundice HEAD: Normocephalic and atraumatic.  EYES: No scleral icterus MOUTH/THROAT: Moist oral membranes.  NECK: No JVD present. No thyromegaly noted. No carotid bruits  LYMPHATIC: No visible cervical adenopathy.  CHEST Normal respiratory effort. No intercostal retractions  LUNGS: Clear to auscultation bilaterally.  No stridor. No wheezes. No rales.  CARDIOVASCULAR: Regular, positive S1-S2, no  murmurs rubs or gallops appreciated ABDOMINAL: No apparent ascites.  EXTREMITIES: No peripheral edema  HEMATOLOGIC: No significant bruising NEUROLOGIC: Oriented to person, place, and time. Nonfocal. Normal muscle tone.  PSYCHIATRIC: Normal mood and affect. Normal behavior. Cooperative  CARDIAC DATABASE: EKG: 11/25/2020: Sinus tachycardia, 101 bpm, left axis deviation, poor R wave progression, old inferior infarct.    Echocardiogram: 12/13/2020: Normal LV systolic function with visual EF 50-55%. Left ventricle cavity is normal in size. Mild left ventricular hypertrophy. Normal global wall motion. Indeterminate diastolic filling pattern, normal LAP. Left atrial cavity is severely dilated. Mild (Grade I) mitral regurgitation. Mild tricuspid regurgitation. No evidence of pulmonary hypertension. Mild pulmonic regurgitation. IVC is dilated with a respiratory response of >50%. No prior study for comparison.    Stress Testing: Nuclear stress test 05/01/2017:  The left ventricular ejection fraction is normal (55-65%).  Nuclear stress EF: 59%.  Blood pressure demonstrated a normal response to exercise.  There was no ST segment deviation noted during stress.  Defect 1: There is a small defect of moderate severity present in the basal inferior location.  This is a low risk study.   Low risk stress nuclear study  with probable inferobasal thinning and no significant ischemia; EF 59 with normal wall motion.  Coronary CTA 12/14/2020: 1. Coronary calcium score of 24.6 AU. This was 70th percentile for age and sex matched control. 2. Normal coronary origin with right dominance. 3. CAD-RADS = 4. Severe stenosis (70-99%) at the proximal /mid LAD due to mixed plaque. First diagonal branch patent but diffuse disease within the ostial to proximal segment. Second diagonal branch is overall patent. Diffuse disease within the proximal to mid segment LCx. RCA without evidence of plaque or stenosis. 4.  Study is sent for CT-FFR to further evaluate the LAD, D1, and LCx. Findings will be performed and reported separately.  Noncardiac impressions: 1. No acute findings in the imaged extracardiac chest. 2. Moderate hiatal hernia. 3. Mild hepatic steatosis. 4. Aortic Atherosclerosis (ICD10-I70.0).  Coronary CT FFR 12/15/2020: CT FFR analysis showed significant stenosis at mid LAD (modeled as total occlusion), mid-to-distal LCX, and acute marginal modeled as total occlusion.  Heart Catheterization: 12/21/2020: LM: Normal LAD: Mid 100% occlusion after Diag 2         Collaterals to distal LAD from Diag 2 and RPDA         High Diag 1 patent         Diag 2 diffuse 60-70% disease LCx: Proximal tandem 75% stenosis        Undefilled and sub-totally occluded OM1        Mid LCx 7% stenosis RCA: Mild luminal irregularities  Attempted wiring of LAD to assess chronicity of LAD occlusion. Based on wire behavior, appears to be chronic  Continue optimal medical management for now. Fortunately, he does not have any LM involvement and EF is normal. Any revascularization recommendation will be for symptomatic improvement.  In addition to optimal medical therapy, following are the options for revascularization  #1. CABG to distal LAD, ?diag2, OM1-if graftable #2. PCI to LCx +/- OM1, stage LAD CTO PCI  CABG more likely to achieve complete revascularization with less procedures. Will continue outpatient discussion re: optimal revascularization   LABORATORY DATA: CBC Latest Ref Rng & Units 12/19/2020 08/14/2018 07/02/2018  WBC 3.4 - 10.8 x10E3/uL 5.0 5.9 5.5  Hemoglobin 13.0 - 17.7 g/dL 14.2 15.1 14.6  Hematocrit 37.5 - 51.0 % 41.6 44.5 42.1  Platelets 150 - 450 x10E3/uL 163 141.0(L) 145.0(L)    CMP Latest Ref Rng & Units 12/19/2020 12/12/2020 08/14/2018  Glucose 65 - 99 mg/dL 106(H) 95 90  BUN 8 - 27 mg/dL _0 Creatinine 0.76 - 1.27 mg/dL 1.27 1.13 1.16  Sodium 134 - 144 mmol/L 139 138 137   Potassium 3.5 - 5.2 mmol/L 4.9 4.7 4.7  Chloride 96 - 106 mmol/L 103 102 102  CO2 20 - 29 mmol/L _1 Calcium 8.6 - 10.2 mg/dL 9.8 9.8 10.1  Total Protein 6.0 - 8.5 g/dL 7.7 - 8.3  Total Bilirubin 0.0 - 1.2 mg/dL 0.6 - 0.5  Alkaline Phos 44 - 121 IU/L 101 - 72  AST 0 - 40 IU/L 27 - 26  ALT 0 - 44 IU/L 32 - 44    Lipid Panel  Lab Results  Component Value Date   CHOL 113 12/19/2020   HDL 29 (L) 12/19/2020   LDLCALC 67 12/19/2020   LDLDIRECT 68 12/19/2020   TRIG 88 12/19/2020   CHOLHDL 4 07/02/2018    No components found for: NTPROBNP Recent Labs    12/19/20 1122  PROBNP 99   No results for input(s): TSH in  the last 8760 hours.  BMP Recent Labs    12/12/20 1419 12/19/20 1122  NA 138 139  K 4.7 4.9  CL 102 103  CO2 21 23  GLUCOSE 95 106*  BUN 12 10  CREATININE 1.13 1.27  CALCIUM 9.8 9.8    HEMOGLOBIN A1C Lab Results  Component Value Date   HGBA1C 5.9 02/25/2017   External Labs: Collected: 11/09/2020, received from PCP Hemoglobin 15.8 g/dL, hematocrit 48% Platelets 143 Creatinine 0.93 mg/dL. eGFR: 103 mL/min per 1.73 m Potassium 4.9 AST 23, ALT 40, alkaline phosphatase 108 Lipid profile: Total cholesterol 191, triglycerides 91, HDL 47, LDL 125, non-HDL 144 Hemoglobin A1c: 5.3 TSH: 1.07  IMPRESSION:    ICD-10-CM   1. Atherosclerosis of native coronary artery of native heart without angina pectoris  I25.10   2. Coronary artery calcification seen on computed tomography  I25.10   3. Mixed hyperlipidemia  E78.2   4. Former smoker  Z87.891   5. Essential hypertension  I10      RECOMMENDATIONS: Matthew Cina is a 60 y.o. male whose past medical history and cardiac risk factors include:  HTN, HLD, BPH, mild coronary artery calcification, 26-pack-year history of smoking, quit in 2014, obesity due to excess calories.  Atherosclerosis of the native coronary arteries with stable angina pectoris:  Medication reconciled.   No active chest pain and  no recent use of sublingual nitroglycerin tablets.  Patient and family have decided to proceed with bypass surgery.  Surgery tentatively scheduled for Jan 10, 2021.   Patient has a CT surgery office visit tomorrow   Patient's and his wife's questions were concerns addressed to their satisfaction  Coronary artery calcification: Continue aspirin and statin therapy  Benign essential hypertension:  Office blood pressures well controlled  Medications reconciled.  Patient is asked to keep a log of his blood pressures and to review it with either his PCP or myself at the next office visit.  Low-salt diet recommended.  Mixed hyperlipidemia:  Continue statin therapy.  Educated on importance of dietary restrictions.  Does not endorse any myalgias.  Former smoker: Educated on the importance of continued smoking cessation.  FINAL MEDICATION LIST END OF ENCOUNTER: No orders of the defined types were placed in this encounter.   Medications Discontinued During This Encounter  Medication Reason  . calcium carbonate (TUMS - DOSED IN MG ELEMENTAL CALCIUM) 500 MG chewable tablet Error  . triamcinolone cream (KENALOG) 0.1 % Error     Current Outpatient Medications:  .  aspirin EC 81 MG tablet, Take 1 tablet (81 mg total) by mouth daily. Swallow whole., Disp: 90 tablet, Rfl: 3 .  atorvastatin (LIPITOR) 80 MG tablet, Take 1 tablet (80 mg total) by mouth at bedtime., Disp: 90 tablet, Rfl: 0 .  finasteride (PROSCAR) 5 MG tablet, TAKE 1 TABLET BY MOUTH EVERY DAY, Disp: 90 tablet, Rfl: 1 .  hydrOXYzine (ATARAX/VISTARIL) 25 MG tablet, Take 25 mg by mouth 3 (three) times daily as needed for itching., Disp: , Rfl:  .  losartan (COZAAR) 50 MG tablet, Take 25 mg by mouth daily., Disp: , Rfl:  .  metoprolol tartrate (LOPRESSOR) 50 MG tablet, Take 1 tablet (50 mg total) by mouth 2 (two) times daily., Disp: 180 tablet, Rfl: 0 .  nitroGLYCERIN (NITROSTAT) 0.4 MG SL tablet, Place 1 tablet (0.4 mg total)  under the tongue every 5 (five) minutes as needed for chest pain. If you require more than two tablets five minutes apart go to the nearest ER via  EMS., Disp: 25 tablet, Rfl: 0 .  pantoprazole (PROTONIX) 40 MG tablet, Take 40 mg by mouth 2 (two) times daily., Disp: , Rfl:  .  tamsulosin (FLOMAX) 0.4 MG CAPS capsule, Take 0.4 mg by mouth daily., Disp: , Rfl:   No orders of the defined types were placed in this encounter.   There are no Patient Instructions on file for this visit.   --Continue cardiac medications as reconciled in final medication list. --Return in about 4 weeks (around 02/02/2021) for Follow up 2 weeks after bypass surgery. . Or sooner if needed. --Continue follow-up with your primary care physician regarding the management of your other chronic comorbid conditions.  Patient's questions and concerns were addressed to his satisfaction. He voices understanding of the instructions provided during this encounter.   This note was created using a voice recognition software as a result there may be grammatical errors inadvertently enclosed that do not reflect the nature of this encounter. Every attempt is made to correct such errors.  Rex Kras, [place], [facility]  Pager: [phone] Office: [phone]

## 2021-01-05 NOTE — Patient Instructions (Signed)
Medication Instructions:  Your physician recommends that you continue on your current medications as directed. Please refer to the Current Medication list given to you today. *If you need a refill on your cardiac medications before your next appointment, please call your pharmacy*   Lab Work: None If you have labs (blood work) drawn today and your tests are completely normal, you will receive your results only by: Marland Kitchen MyChart Message (if you have MyChart) OR . A paper copy in the mail If you have any lab test that is abnormal or we need to change your treatment, we will call you to review the results.   Testing/Procedures: None   Follow-Up: At [facility], you and your health needs are our priority.  As part of our continuing mission to provide you with exceptional heart care, we have created designated Provider Care Teams.  These Care Teams include your primary Cardiologist (physician) and Advanced Practice Providers (APPs -  Physician Assistants and Nurse Practitioners) who all work together to provide you with the care you need, when you need it.  We recommend signing up for the patient portal called "MyChart".  Sign up information is provided on this After Visit Summary.  MyChart is used to connect with patients for Virtual Visits (Telemedicine).  Patients are able to view lab/test results, encounter notes, upcoming appointments, etc.  Non-urgent messages can be sent to your provider as well.   To learn more about what you can do with MyChart, go to [email or web address].    Your next appointment:     The format for your next appointment:   In person   Provider:   Dr. Terri Skains   Other Instructions Follow up with your Cardiologist

## 2021-01-05 NOTE — Progress Notes (Signed)
Surgical Instructions    Your procedure is scheduled on Tuesday, May 17th, 2022  Report to [facility] Main Entrance "A" at 05:45 A.M., then check in with the Admitting office.  Call this number if you have problems the morning of surgery:  [phone]   If you have any questions prior to your surgery date call [phone]: Open Monday-Friday 8am-4pm    Remember:  Do not eat or drink after midnight the night before your surgery   Take these medicines the morning of surgery with A SIP OF WATER   finasteride (PROSCAR) metoprolol tartrate (LOPRESSOR)  pantoprazole (PROTONIX) tamsulosin (FLOMAX)  If needed:  hydrOXYzine (ATARAX/VISTARIL) nitroGLYCERIN (NITROSTAT) - please, let the nurse know if you used it  Follow your surgeon's instructions on when to stop Aspirin.  If no instructions were given by your surgeon then you will need to call the office to get those instructions.    As of today, STOP taking Aleve, Naproxen, Ibuprofen, Motrin, Advil, Goody's, BC's, all herbal medications, fish oil, and all vitamins.                     Do not wear jewelry            Do not wear lotions, powders, colognes, or deodorant.            Men may shave face and neck.            Do not bring valuables to the hospital.            [facility] is not responsible for any belongings or valuables.  Do NOT Smoke (Tobacco/Vaping) or drink Alcohol 24 hours prior to your procedure If you use a CPAP at night, you may bring all equipment for your overnight stay.   Contacts, glasses, dentures or bridgework may not be worn into surgery, please bring cases for these belongings   For patients admitted to the hospital, discharge time will be determined by your treatment team.   Patients discharged the day of surgery will not be allowed to drive home, and someone needs to stay with them for 24 hours.    Special instructions:   [place]- Preparing For Surgery  Before surgery, you can play an  important role. Because skin is not sterile, your skin needs to be as free of germs as possible. You can reduce the number of germs on your skin by washing with CHG (chlorahexidine gluconate) Soap before surgery.  CHG is an antiseptic cleaner which kills germs and bonds with the skin to continue killing germs even after washing.    Oral Hygiene is also important to reduce your risk of infection.  Remember - BRUSH YOUR TEETH THE MORNING OF SURGERY WITH YOUR REGULAR TOOTHPASTE  Please do not use if you have an allergy to CHG or antibacterial soaps. If your skin becomes reddened/irritated stop using the CHG.  Do not shave (including legs and underarms) for at least 48 hours prior to first CHG shower. It is OK to shave your face.  Please follow these instructions carefully.   1. Shower the NIGHT BEFORE SURGERY and the MORNING OF SURGERY  2. If you chose to wash your hair, wash your hair first as usual with your normal shampoo.  3. After you shampoo, rinse your hair and body thoroughly to remove the shampoo.  4. Use CHG Soap as you would any other liquid soap. You can apply CHG directly to the skin and wash gently with  a scrungie or a clean washcloth.   5. Apply the CHG Soap to your body ONLY FROM THE NECK DOWN.  Do not use on open wounds or open sores. Avoid contact with your eyes, ears, mouth and genitals (private parts). Wash Face and genitals (private parts)  with your normal soap.   6. Wash thoroughly, paying special attention to the area where your surgery will be performed.  7. Thoroughly rinse your body with warm water from the neck down.  8. DO NOT shower/wash with your normal soap after using and rinsing off the CHG Soap.  9. Pat yourself dry with a CLEAN TOWEL.  10. Wear CLEAN PAJAMAS to bed the night before surgery  11. Place CLEAN SHEETS on your bed the night before your surgery  12. DO NOT SLEEP WITH PETS.   Day of Surgery: Take a shower with CHG soap. Wear  Clean/Comfortable clothing the morning of surgery Do not apply any deodorants/lotions.   Remember to brush your teeth WITH YOUR REGULAR TOOTHPASTE.   Please read over the following fact sheets that you were given.

## 2021-01-06 ENCOUNTER — Encounter: Payer: Self-pay | Admitting: Thoracic Surgery (Cardiothoracic Vascular Surgery)

## 2021-01-06 ENCOUNTER — Encounter (HOSPITAL_COMMUNITY): Payer: Self-pay

## 2021-01-06 ENCOUNTER — Other Ambulatory Visit: Payer: Self-pay

## 2021-01-06 ENCOUNTER — Ambulatory Visit (HOSPITAL_COMMUNITY)
Admission: RE | Admit: 2021-01-06 | Discharge: 2021-01-06 | Disposition: A | Discharge status: Home / Self Care | Payer: 59 | Source: Ambulatory Visit | Attending: Thoracic Surgery (Cardiothoracic Vascular Surgery) | Admitting: Thoracic Surgery (Cardiothoracic Vascular Surgery)

## 2021-01-06 ENCOUNTER — Ambulatory Visit: Payer: 59 | Admitting: Thoracic Surgery (Cardiothoracic Vascular Surgery)

## 2021-01-06 ENCOUNTER — Encounter (HOSPITAL_COMMUNITY)
Admission: RE | Admit: 2021-01-06 | Discharge: 2021-01-06 | Disposition: A | Discharge status: Home / Self Care | Payer: 59 | Source: Ambulatory Visit | Attending: Thoracic Surgery (Cardiothoracic Vascular Surgery) | Admitting: Thoracic Surgery (Cardiothoracic Vascular Surgery)

## 2021-01-06 VITALS — BP 100/63 | HR 50 | Resp 20 | Ht 67.0 in | Wt 196.0 lb

## 2021-01-06 DIAGNOSIS — Z87891 Personal history of nicotine dependence: Secondary | ICD-10-CM | POA: Diagnosis not present

## 2021-01-06 DIAGNOSIS — Z01818 Encounter for other preprocedural examination: Secondary | ICD-10-CM

## 2021-01-06 DIAGNOSIS — E785 Hyperlipidemia, unspecified: Secondary | ICD-10-CM | POA: Diagnosis not present

## 2021-01-06 DIAGNOSIS — I2510 Atherosclerotic heart disease of native coronary artery without angina pectoris: Secondary | ICD-10-CM

## 2021-01-06 DIAGNOSIS — I10 Essential (primary) hypertension: Secondary | ICD-10-CM | POA: Diagnosis not present

## 2021-01-06 DIAGNOSIS — Z20822 Contact with and (suspected) exposure to covid-19: Secondary | ICD-10-CM | POA: Insufficient documentation

## 2021-01-06 DIAGNOSIS — E782 Mixed hyperlipidemia: Secondary | ICD-10-CM | POA: Insufficient documentation

## 2021-01-06 HISTORY — DX: Unspecified osteoarthritis, unspecified site: M19.90

## 2021-01-06 HISTORY — DX: Angina pectoris, unspecified: I20.9

## 2021-01-06 HISTORY — DX: Prediabetes: R73.03

## 2021-01-06 LAB — BLOOD GAS, ARTERIAL
Acid-base deficit: 2.1 mmol/L — ABNORMAL HIGH (ref 0.0–2.0)
Bicarbonate: 22 mmol/L (ref 20.0–28.0)
FIO2: 21
O2 Saturation: 98.6 %
Patient temperature: 37
pCO2 arterial: 36.7 mmHg (ref 32.0–48.0)
pH, Arterial: 7.395 (ref 7.350–7.450)
pO2, Arterial: 122 mmHg — ABNORMAL HIGH (ref 83.0–108.0)

## 2021-01-06 LAB — COMPREHENSIVE METABOLIC PANEL
ALT: 40 U/L (ref 0–44)
AST: 34 U/L (ref 15–41)
Albumin: 4 g/dL (ref 3.5–5.0)
Alkaline Phosphatase: 78 U/L (ref 38–126)
Anion gap: 9 (ref 5–15)
BUN: 14 mg/dL (ref 6–20)
CO2: 20 mmol/L — ABNORMAL LOW (ref 22–32)
Calcium: 9.6 mg/dL (ref 8.9–10.3)
Chloride: 104 mmol/L (ref 98–111)
Creatinine, Ser: 1.04 mg/dL (ref 0.61–1.24)
GFR, Estimated: >60 mL/min (ref >60)
Glucose, Bld: 82 mg/dL (ref 70–99)
Potassium: 4.4 mmol/L (ref 3.5–5.1)
Sodium: 133 mmol/L — ABNORMAL LOW (ref 135–145)
Total Bilirubin: 1.1 mg/dL (ref 0.3–1.2)
Total Protein: 8 g/dL (ref 6.5–8.1)

## 2021-01-06 LAB — URINALYSIS, ROUTINE W REFLEX MICROSCOPIC
Bilirubin Urine: NEGATIVE
Glucose, UA: NEGATIVE mg/dL
Hgb urine dipstick: NEGATIVE
Ketones, ur: NEGATIVE mg/dL
Leukocytes,Ua: NEGATIVE
Nitrite: NEGATIVE
Protein, ur: NEGATIVE mg/dL
Specific Gravity, Urine: 1.01 (ref 1.005–1.030)
pH: 5 (ref 5.0–8.0)

## 2021-01-06 LAB — APTT: aPTT: 30 seconds (ref 24–36)

## 2021-01-06 LAB — PROTIME-INR
INR: 1.1 (ref 0.8–1.2)
Prothrombin Time: 14 seconds (ref 11.4–15.2)

## 2021-01-06 LAB — CBC
HCT: 42 % (ref 39.0–52.0)
Hemoglobin: 14.1 g/dL (ref 13.0–17.0)
MCH: 27.6 pg (ref 26.0–34.0)
MCHC: 33.6 g/dL (ref 30.0–36.0)
MCV: 82.4 fL (ref 80.0–100.0)
Platelets: 155 10*3/uL (ref 150–400)
RBC: 5.1 MIL/uL (ref 4.22–5.81)
RDW: 14.1 % (ref 11.5–15.5)
WBC: 4 10*3/uL (ref 4.0–10.5)
nRBC: 0 % (ref 0.0–0.2)

## 2021-01-06 LAB — HEMOGLOBIN A1C
Hgb A1c MFr Bld: 5.3 % (ref 4.8–5.6)
Mean Plasma Glucose: 105.41 mg/dL

## 2021-01-06 LAB — SURGICAL PCR SCREEN
MRSA, PCR: NEGATIVE
Staphylococcus aureus: NEGATIVE

## 2021-01-06 NOTE — Progress Notes (Signed)
PCP - Leota Sauers, MD Cardiologist - Rex Kras, DO  PPM/ICD - denies Device Orders - N/A Rep Notified - N/A  Chest x-ray - 01/06/2021 EKG - 12/21/2020 Stress Test - 05/01/2017 ECHO - 12/13/2020 Cardiac Cath - 12/21/2020  Sleep Study - 04/07/2013 ([facility]) CPAP - no  Blood Thinner Instructions: N/A Aspirin Instructions: Patient was instructed to follow his surgeon's instructions on when to stop Aspirin.  If no instructions were given by his surgeon then he will need to call the office to get those instructions.   As of today, STOP taking Aleve, Naproxen, Ibuprofen, Motrin, Advil, Goody's, BC's, all herbal medications, fish oil, and all vitamins.  ERAS Protcol - no  COVID TEST- 01/06/2021   Anesthesia review: yes - cardiac Hx,  Patient denies shortness of breath, fever, cough and chest pain at PAT appointment   All instructions explained to the patient, with a verbal understanding of the material. Patient agrees to go over the instructions while at home for a better understanding. Patient also instructed to self quarantine after being tested for COVID-19. The opportunity to ask questions was provided.

## 2021-01-06 NOTE — Progress Notes (Signed)
      [address]       [place] [address]             [id number]        [name] [place] Medical Record #[id number] Date of Birth: 04-17-1961  Referring: Rex Kras, DO Primary Care: Malena Peer, MD Primary Cardiologist:None  Reason for visit:   follow-up  History of Present Illness:     Eugene Mccullough comes in to discuss surgical planning.  He has had no new symptoms since his last appointment.  He is ready to undergo surgery.  Physical Exam: BP 100/63   Pulse (!) 50   Resp 20   Ht 5\' 7"  (1.702 m)   Wt 196 lb (88.9 kg)   SpO2 99% Comment: RA  BMI 30.70 kg/m   Alert NAD Easy work of breathing Bradycardic No peripheral edema     Assessment / Plan:   60 year old gentleman with two-vessel coronary artery disease.  We discussed the importance of undergoing coronary bypass grafting.  I reviewed the images with him and his wife again.  He has very small targets in the circumflex and LAD distribution.  He would like [place] to use the left radial artery as one of the conduits.  We will also need a small section of vein graft to bypass the diagonal vessel.  He is already scheduled for surgery May 17.   Lajuana Matte 01/06/2021 4:37 PM

## 2021-01-06 NOTE — H&P (View-Only) (Signed)
      [address]       [place] [address]             [phone]        Eugene Mccullough [facility] Record #[id number] Date of Birth: 02/07/1961  Referring: Tolia, Sunit, DO Primary Care: [name], Andrew O, MD Primary Cardiologist:None  Reason for visit:   follow-up  History of Present Illness:     Eugene Mccullough comes in to discuss surgical planning.  He has had no new symptoms since his last appointment.  He is ready to undergo surgery.  Physical Exam: BP 100/63   Pulse (!) 50   Resp 20   Ht 5' 7" (1.702 m)   Wt 196 lb (88.9 kg)   SpO2 99% Comment: RA  BMI 30.70 kg/m   Alert NAD Easy work of breathing Bradycardic No peripheral edema     Assessment / Plan:   60-year-old gentleman with two-vessel coronary artery disease.  We discussed the importance of undergoing coronary bypass grafting.  I reviewed the images with him and his wife again.  He has very small targets in the circumflex and LAD distribution.  He would like us to use the left radial artery as one of the conduits.  We will also need a small section of vein graft to bypass the diagonal vessel.  He is already scheduled for surgery May 17.   Eugene Mccullough O Eugene Mccullough 01/06/2021 4:37 PM        

## 2021-01-06 NOTE — Progress Notes (Signed)
Cardiology Office Note:    Date:  01/06/2021   ID:  Eugene Mccullough, DOB 30-Dec-1960, MRN [id number]  PCP:  Eugene Peer, MD  Cardiologist:  None  Electrophysiologist:  None   Referring MD: Eugene Peer, MD   " I am here for a second opinion because I have a lot of questions"  History of Present Illness:    Eugene Mccullough is a 60 y.o. male with a hx of multivessel coronary artery disease with plans for coronary artery bypass grafting next week, hypertension, hyperlipidemia, BPH, former smoker, obesity.  The patient follows with Eugene Mccullough and was last seen on December 15, 2020.  During our visit he had had a coronary CTA which showed high risk finding and a cardiac catheterization was recommended.    Past Medical History:  Diagnosis Date  . Anemia   . Anginal pain ([place])   . Arthritis   . BPH (benign prostatic hyperplasia)   . Eczema   . Gastric ulcer   . GERD (gastroesophageal reflux disease)   . Gout   . H. pylori infection   . Hyperlipidemia   . Hypertension   . IBS (irritable bowel syndrome)   . Pre-diabetes   . Sleep apnea    Does not use CPAP    Past Surgical History:  Procedure Laterality Date  . CARDIAC CATHETERIZATION     2022  . CHOLECYSTECTOMY  2005  . COLONOSCOPY  08/2010  . LEFT HEART CATH AND CORONARY ANGIOGRAPHY N/A 12/21/2020   Procedure: LEFT HEART CATH AND CORONARY ANGIOGRAPHY;  Surgeon: Nigel Mormon, MD;  Location: [facility];  Service: Cardiovascular;  Laterality: N/A;  . UPPER GI ENDOSCOPY  2019  . WISDOM TOOTH EXTRACTION      Current Medications: Current Meds  Medication Sig  . aspirin EC 81 MG tablet Take 1 tablet (81 mg total) by mouth daily. Swallow whole.  Marland Kitchen atorvastatin (LIPITOR) 80 MG tablet Take 1 tablet (80 mg total) by mouth at bedtime.  . finasteride (PROSCAR) 5 MG tablet TAKE 1 TABLET BY MOUTH EVERY DAY  . hydrOXYzine (ATARAX/VISTARIL) 25 MG tablet Take 25 mg by mouth 3 (three) times daily as needed for itching.   . losartan (COZAAR) 50 MG tablet Take 25 mg by mouth daily.  . metoprolol tartrate (LOPRESSOR) 50 MG tablet Take 1 tablet (50 mg total) by mouth 2 (two) times daily.  . nitroGLYCERIN (NITROSTAT) 0.4 MG SL tablet Place 1 tablet (0.4 mg total) under the tongue every 5 (five) minutes as needed for chest pain. If you require more than two tablets five minutes apart go to the nearest ER via EMS.  . pantoprazole (PROTONIX) 40 MG tablet Take 40 mg by mouth 2 (two) times daily.  . tamsulosin (FLOMAX) 0.4 MG CAPS capsule Take 0.4 mg by mouth daily.     Allergies:   Hydrocodone-acetaminophen   Social History   Socioeconomic History  . Marital status: Married    Spouse name: Not on file  . Number of children: 3  . Years of education: Not on file  . Highest education level: Not on file  Occupational History  . Occupation: driver  Tobacco Use  . Smoking status: Former Smoker    Packs/day: 1.00    Years: 20.00    Pack years: 20.00    Types: Cigarettes  . Smokeless tobacco: Never Used  Vaping Use  . Vaping Use: Never used  Substance and Sexual Activity  . Alcohol use: No  Alcohol/week: 0.0 standard drinks  . Drug use: Never  . Sexual activity: Yes  Other Topics Concern  . Not on file  Social History Narrative  . Not on file   Social Determinants of Health   Financial Resource Strain: Not on file  Food Insecurity: Not on file  Transportation Needs: Not on file  Physical Activity: Not on file  Stress: Not on file  Social Connections: Not on file     Family History: The patient's family history includes Diabetes in his brother and sister; Eating disorder in his daughter; Eczema in his son; Hyperlipidemia in his mother; Hypertension in his sister; Irritable bowel syndrome in his son; Obesity in his sister. There is no history of Cancer, Early death, Heart disease, Kidney disease, Stroke, Alcohol abuse, Colon cancer, Esophageal cancer, Stomach cancer, or Rectal cancer.  ROS:    Review of Systems  Constitution: Negative for decreased appetite, fever and weight gain.  HENT: Negative for congestion, ear discharge, hoarse voice and sore throat.   Eyes: Negative for discharge, redness, vision loss in right eye and visual halos.  Cardiovascular: Negative for chest pain, dyspnea on exertion, leg swelling, orthopnea and palpitations.  Respiratory: Negative for cough, hemoptysis, shortness of breath and snoring.   Endocrine: Negative for heat intolerance and polyphagia.  Hematologic/Lymphatic: Negative for bleeding problem. Does not bruise/bleed easily.  Skin: Negative for flushing, nail changes, rash and suspicious lesions.  Musculoskeletal: Negative for arthritis, joint pain, muscle cramps, myalgias, neck pain and stiffness.  Gastrointestinal: Negative for abdominal pain, bowel incontinence, diarrhea and excessive appetite.  Genitourinary: Negative for decreased libido, genital sores and incomplete emptying.  Neurological: Negative for brief paralysis, focal weakness, headaches and loss of balance.  Psychiatric/Behavioral: Negative for altered mental status, depression and suicidal ideas.  Allergic/Immunologic: Negative for HIV exposure and persistent infections.    EKGs/Labs/Other Studies Reviewed:    The following studies were reviewed today:   EKG: None today  12/21/2020 left heart catheterization LM: Normal LAD: Mid 100% occlusion after Diag 2         Collaterals to distal LAD from Diag 2 and RPDA         High Diag 1 patent         Diag 2 diffuse 60-70% disease LCx: Proximal tandem 75% stenosis        Undefilled and sub-totally occluded OM1        Mid LCx 7% stenosis RCA: Mild luminal irregularities  Attempted wiring of LAD to assess chronicity of LAD occlusion. Based on wire behavior, appears to be chronic  Continue optimal medical management for now. Fortunately, he does not have any LM involvement and EF is normal. Any revascularization  recommendation will be for symptomatic improvement.  In addition to optimal medical therapy, following are the options for revascularization  #1. CABG to distal LAD, ?diag2, OM1-if graftable #2. PCI to LCx +/- OM1, stage LAD CTO PCI  CABG more likely to achieve complete revascularization with less procedures. Will continue outpatient discussion re: optimal revascularization   Echocardiogram: 12/13/2020: Normal LV systolic function with visual EF 50-55%. Left ventricle cavity is normal in size. Mild left ventricular hypertrophy. Normal global wall motion. Indeterminate diastolic filling pattern, normal LAP. Left atrial cavity is severely dilated. Mild (Grade I) mitral regurgitation. Mild tricuspid regurgitation. No evidence of pulmonary hypertension. Mild pulmonic regurgitation. IVC is dilated with a respiratory response of >50%. No prior study for comparison.    Stress Testing: Nuclear stress test 05/01/2017:  The left ventricular  ejection fraction is normal (55-65%).  Nuclear stress EF: 59%.  Blood pressure demonstrated a normal response to exercise.  There was no ST segment deviation noted during stress.  Defect 1: There is a small defect of moderate severity present in the basal inferior location.  This is a low risk study.  Low risk stress nuclear study with probable inferobasal thinning and no significant ischemia; EF 59 with normal wall motion.  Coronary CTA 12/14/2020: 1. Coronary calcium score of 24.6 AU. This was 70th percentile for age and sex matched control. 2. Normal coronary origin with right dominance. 3. CAD-RADS = 4. Severe stenosis (70-99%) at the proximal /mid LAD due to mixed plaque. First diagonal branch patent but diffuse disease within the ostial to proximal segment. Second diagonal branch is overall patent. Diffuse disease within the proximal to mid segment LCx. RCA without evidence of plaque or stenosis. 4. Study is sent for CT-FFR to further  evaluate the LAD, D1, and LCx. Findings will be performed and reported separately.  Noncardiac impressions: 1. No acute findings in the imaged extracardiac chest. 2. Moderate hiatal hernia. 3. Mild hepatic steatosis. 4. Aortic Atherosclerosis (ICD10-I70.0).  Coronary CT FFR 12/15/2020: CT FFR analysis showed significant stenosis at mid LAD (modeled as total occlusion), mid-to-distal LCX, and acute marginal modeled as total occlusion.   Recent Labs: 12/19/2020: Magnesium 2.0; NT-Pro BNP 99 01/06/2021: ALT 40; BUN 14; Creatinine, Ser 1.04; Hemoglobin 14.1; Platelets 155; Potassium 4.4; Sodium 133  Recent Lipid Panel    Component Value Date/Time   CHOL 113 12/19/2020 1122   TRIG 88 12/19/2020 1122   HDL 29 (L) 12/19/2020 1122   CHOLHDL 4 07/02/2018 1358   VLDL 50.4 (H) 07/02/2018 1358   LDLCALC 67 12/19/2020 1122   LDLDIRECT 68 12/19/2020 1122   LDLDIRECT 104.0 07/02/2018 1358    Physical Exam:    VS:  BP 108/68   Pulse (!) 58   Ht 5\' 7"  (1.702 m)   Wt 196 lb 6.4 oz (89.1 kg)   SpO2 98%   BMI 30.76 kg/m     Wt Readings from Last 3 Encounters:  01/06/21 196 lb (88.9 kg)  01/06/21 196 lb 4 oz (89 kg)  01/05/21 196 lb 6.4 oz (89.1 kg)     GEN: Well nourished, well developed in no acute distress HEENT: Normal NECK: No JVD; No carotid bruits LYMPHATICS: No lymphadenopathy CARDIAC: S1S2 noted,RRR, no murmurs, rubs, gallops RESPIRATORY:  Clear to auscultation without rales, wheezing or rhonchi  ABDOMEN: Soft, non-tender, non-distended, +bowel sounds, no guarding. EXTREMITIES: No edema, No cyanosis, no clubbing MUSCULOSKELETAL:  No deformity  SKIN: Warm and dry NEUROLOGIC:  Alert and oriented x 3, non-focal PSYCHIATRIC:  Normal affect, good insight  ASSESSMENT:    1. Coronary artery disease involving native coronary artery of native heart, unspecified whether angina present   2. Essential hypertension   3. Mixed hyperlipidemia   4. Obesity (BMI 30-39.9)    PLAN:     The patient is here today with his wife he has a lot of questions about his upcoming procedures.  First question was why did his nuclear stress test few years ago was reported to be normal and he has coronary artery disease.  I explained to the patient the strategy and rationale of reporting nuclear stress test.  He also had questions about his heart catheterization and based on review of the images as well as the report I did explain to the patient that during his procedure his best interest  was taking an explained to him the chronicity of his LAD occlusion and that the recommendation that had been made by all of my colleagues are the right ones in his best interest.  He had specific questions about the procedure itself I did tell him it was best for him to speak to the surgeon.  He is apprehensive about undergoing procedures as he has talked a lot of his family members and even friends in [place] most of which has told him to do medical therapy.  I did explain to the patient that ultimately this is his decision for moving forward with his cardiovascular care but in my opinion the team is looking for his best medical interest in the long run as well.   He thanked me for the visit.  And I encouraged the patient to go follow-up with his cardiologist Eugene Mccullough.  The patient is in agreement with the above plan. The patient left the office in stable condition.  The patient will follow up with Eugene Mccullough.    Medication Adjustments/Labs and Tests Ordered: Current medicines are reviewed at length with the patient today.  Concerns regarding medicines are outlined above.  Orders Placed This Encounter  Procedures  . EKG 12-Lead   No orders of the defined types were placed in this encounter.   Patient Instructions  Medication Instructions:  Your physician recommends that you continue on your current medications as directed. Please refer to the Current Medication list given to you today. *If you need a  refill on your cardiac medications before your next appointment, please call your pharmacy*   Lab Work: None If you have labs (blood work) drawn today and your tests are completely normal, you will receive your results only by: Marland Kitchen MyChart Message (if you have MyChart) OR . A paper copy in the mail If you have any lab test that is abnormal or we need to change your treatment, we will call you to review the results.   Testing/Procedures: None   Follow-Up: At [facility], you and your health needs are our priority.  As part of our continuing mission to provide you with exceptional heart care, we have created designated Provider Care Teams.  These Care Teams include your primary Cardiologist (physician) and Advanced Practice Providers (APPs -  Physician Assistants and Nurse Practitioners) who all work together to provide you with the care you need, when you need it.  We recommend signing up for the patient portal called "MyChart".  Sign up information is provided on this After Visit Summary.  MyChart is used to connect with patients for Virtual Visits (Telemedicine).  Patients are able to view lab/test results, encounter notes, upcoming appointments, etc.  Non-urgent messages can be sent to your provider as well.   To learn more about what you can do with MyChart, go to [email or web address].    Your next appointment:     The format for your next appointment:   In person   Provider:   Dr. Terri Mccullough   Other Instructions Follow up with your Cardiologist     Adopting a Healthy Lifestyle.  Know what a healthy weight is for you (roughly BMI <25) and aim to maintain this   Aim for 7+ servings of fruits and vegetables daily   65-80+ fluid ounces of water or unsweet tea for healthy kidneys   Limit to max 1 drink of alcohol per day; avoid smoking/tobacco   Limit animal fats in diet for cholesterol and heart health - choose  grass fed whenever available   Avoid highly processed foods,  and foods high in saturated/trans fats   Aim for low stress - take time to unwind and care for your mental health   Aim for 150 min of moderate intensity exercise weekly for heart health, and weights twice weekly for bone health   Aim for 7-9 hours of sleep daily   When it comes to diets, agreement about the perfect plan isnt easy to find, even among the experts. Experts at the [facility] [employer] developed an idea known as the Healthy Eating Plate. Just imagine a plate divided into logical, healthy portions.   The emphasis is on diet quality:   Load up on vegetables and fruits - one-half of your plate: Aim for color and variety, and remember that potatoes dont count.   Go for whole grains - one-quarter of your plate: Whole wheat, barley, wheat berries, quinoa, oats, brown rice, and foods made with them. If you want pasta, go with whole wheat pasta.   Protein power - one-quarter of your plate: Fish, chicken, beans, and nuts are all healthy, versatile protein sources. Limit red meat.   The diet, however, does go beyond the plate, offering a few other suggestions.   Use healthy plant oils, such as olive, canola, soy, corn, sunflower and peanut. Check the labels, and avoid partially hydrogenated oil, which have unhealthy trans fats.   If youre thirsty, drink water. Coffee and tea are good in moderation, but skip sugary drinks and limit milk and dairy products to one or two daily servings.   The type of carbohydrate in the diet is more important than the amount. Some sources of carbohydrates, such as vegetables, fruits, whole grains, and beans-are healthier than others.   Finally, stay active  Signed, Thomasene Ripple, DO  01/06/2021 2:32 PM    [facility]

## 2021-01-07 LAB — SARS CORONAVIRUS 2 (TAT 6-24 HRS): SARS Coronavirus 2: NEGATIVE

## 2021-01-09 MED ORDER — TRANEXAMIC ACID 1000 MG/10ML IV SOLN
1.5000 mg/kg/h | INTRAVENOUS | Status: AC
  Administered 2021-01-10: 1.5 mg/kg/h via INTRAVENOUS
  Filled 2021-01-09: qty 25

## 2021-01-09 MED ORDER — PLASMA-LYTE 148 IV SOLN
INTRAVENOUS | Status: DC
  Filled 2021-01-09: qty 2.5

## 2021-01-09 MED ORDER — CEFAZOLIN SODIUM-DEXTROSE 2-4 GM/100ML-% IV SOLN
2.0000 g | INTRAVENOUS | Status: AC
  Administered 2021-01-10 (×2): 2 g via INTRAVENOUS
  Filled 2021-01-09: qty 100

## 2021-01-09 MED ORDER — POTASSIUM CHLORIDE 2 MEQ/ML IV SOLN
80.0000 meq | INTRAVENOUS | Status: DC
  Filled 2021-01-09: qty 40

## 2021-01-09 MED ORDER — VANCOMYCIN HCL 1500 MG/300ML IV SOLN
1500.0000 mg | INTRAVENOUS | Status: AC
  Administered 2021-01-10: 1500 mg via INTRAVENOUS
  Filled 2021-01-09: qty 300

## 2021-01-09 MED ORDER — NITROGLYCERIN IN D5W 200-5 MCG/ML-% IV SOLN
2.0000 ug/min | INTRAVENOUS | Status: AC
  Administered 2021-01-10: 10 ug/min via INTRAVENOUS
  Filled 2021-01-09: qty 250

## 2021-01-09 MED ORDER — PHENYLEPHRINE HCL-NACL 20-0.9 MG/250ML-% IV SOLN
30.0000 ug/min | INTRAVENOUS | Status: DC
  Filled 2021-01-09: qty 250

## 2021-01-09 MED ORDER — MANNITOL 20 % IV SOLN
Freq: Once | INTRAVENOUS | Status: DC
  Filled 2021-01-09: qty 13

## 2021-01-09 MED ORDER — SODIUM CHLORIDE 0.9 % IV SOLN
INTRAVENOUS | Status: DC
  Filled 2021-01-09: qty 30

## 2021-01-09 MED ORDER — TRANEXAMIC ACID (OHS) BOLUS VIA INFUSION
15.0000 mg/kg | INTRAVENOUS | Status: AC
  Administered 2021-01-10: 1333.5 mg via INTRAVENOUS
  Filled 2021-01-09: qty 1334

## 2021-01-09 MED ORDER — INSULIN REGULAR(HUMAN) IN NACL 100-0.9 UT/100ML-% IV SOLN
INTRAVENOUS | Status: AC
  Administered 2021-01-10: 1.1 [IU]/h via INTRAVENOUS
  Filled 2021-01-09: qty 100

## 2021-01-09 MED ORDER — NOREPINEPHRINE 4 MG/250ML-% IV SOLN
0.0000 ug/min | INTRAVENOUS | Status: DC
  Filled 2021-01-09: qty 250

## 2021-01-09 MED ORDER — EPINEPHRINE HCL 5 MG/250ML IV SOLN IN NS
0.0000 ug/min | INTRAVENOUS | Status: DC
  Filled 2021-01-09: qty 250

## 2021-01-09 MED ORDER — DEXMEDETOMIDINE HCL IN NACL 400 MCG/100ML IV SOLN
0.1000 ug/kg/h | INTRAVENOUS | Status: AC
  Administered 2021-01-10: .2 ug/kg/h via INTRAVENOUS
  Filled 2021-01-09: qty 100

## 2021-01-09 MED ORDER — TRANEXAMIC ACID (OHS) PUMP PRIME SOLUTION
2.0000 mg/kg | INTRAVENOUS | Status: DC
  Filled 2021-01-09: qty 1.78

## 2021-01-09 MED ORDER — CEFAZOLIN SODIUM-DEXTROSE 2-4 GM/100ML-% IV SOLN
2.0000 g | INTRAVENOUS | Status: DC
  Filled 2021-01-09 (×2): qty 100

## 2021-01-09 MED ORDER — MILRINONE LACTATE IN DEXTROSE 20-5 MG/100ML-% IV SOLN
0.3000 ug/kg/min | INTRAVENOUS | Status: DC
  Filled 2021-01-09: qty 100

## 2021-01-10 ENCOUNTER — Encounter (HOSPITAL_COMMUNITY): Payer: Self-pay | Admitting: Thoracic Surgery (Cardiothoracic Vascular Surgery)

## 2021-01-10 ENCOUNTER — Other Ambulatory Visit: Payer: Self-pay

## 2021-01-10 ENCOUNTER — Inpatient Hospital Stay (HOSPITAL_COMMUNITY): Payer: 59

## 2021-01-10 ENCOUNTER — Inpatient Hospital Stay (HOSPITAL_COMMUNITY)
Admission: RE | Admit: 2021-01-10 | Discharge: 2021-01-15 | DRG: 235 | Disposition: A | Discharge status: Home / Self Care | Payer: 59 | Attending: Thoracic Surgery (Cardiothoracic Vascular Surgery) | Admitting: Thoracic Surgery (Cardiothoracic Vascular Surgery)

## 2021-01-10 ENCOUNTER — Inpatient Hospital Stay (HOSPITAL_COMMUNITY): Payer: 59 | Admitting: Vascular Surgery

## 2021-01-10 ENCOUNTER — Inpatient Hospital Stay (HOSPITAL_COMMUNITY)
Admission: RE | Disposition: A | Discharge status: Home / Self Care | Payer: Self-pay | Source: Home / Self Care | Attending: Thoracic Surgery (Cardiothoracic Vascular Surgery)

## 2021-01-10 ENCOUNTER — Inpatient Hospital Stay (HOSPITAL_COMMUNITY): Payer: 59 | Admitting: Certified Registered Nurse Anesthetist

## 2021-01-10 DIAGNOSIS — M109 Gout, unspecified: Secondary | ICD-10-CM | POA: Diagnosis present

## 2021-01-10 DIAGNOSIS — G4730 Sleep apnea, unspecified: Secondary | ICD-10-CM | POA: Diagnosis present

## 2021-01-10 DIAGNOSIS — J90 Pleural effusion, not elsewhere classified: Secondary | ICD-10-CM

## 2021-01-10 DIAGNOSIS — E669 Obesity, unspecified: Secondary | ICD-10-CM | POA: Diagnosis present

## 2021-01-10 DIAGNOSIS — N400 Enlarged prostate without lower urinary tract symptoms: Secondary | ICD-10-CM | POA: Diagnosis present

## 2021-01-10 DIAGNOSIS — I25119 Atherosclerotic heart disease of native coronary artery with unspecified angina pectoris: Principal | ICD-10-CM | POA: Diagnosis present

## 2021-01-10 DIAGNOSIS — I10 Essential (primary) hypertension: Secondary | ICD-10-CM | POA: Diagnosis present

## 2021-01-10 DIAGNOSIS — I9789 Other postprocedural complications and disorders of the circulatory system, not elsewhere classified: Secondary | ICD-10-CM | POA: Diagnosis not present

## 2021-01-10 DIAGNOSIS — R571 Hypovolemic shock: Secondary | ICD-10-CM | POA: Diagnosis not present

## 2021-01-10 DIAGNOSIS — R7303 Prediabetes: Secondary | ICD-10-CM | POA: Diagnosis present

## 2021-01-10 DIAGNOSIS — K219 Gastro-esophageal reflux disease without esophagitis: Secondary | ICD-10-CM | POA: Diagnosis present

## 2021-01-10 DIAGNOSIS — Z951 Presence of aortocoronary bypass graft: Secondary | ICD-10-CM

## 2021-01-10 DIAGNOSIS — I2510 Atherosclerotic heart disease of native coronary artery without angina pectoris: Secondary | ICD-10-CM

## 2021-01-10 DIAGNOSIS — Z87891 Personal history of nicotine dependence: Secondary | ICD-10-CM

## 2021-01-10 DIAGNOSIS — D6489 Other specified anemias: Secondary | ICD-10-CM | POA: Diagnosis not present

## 2021-01-10 DIAGNOSIS — E785 Hyperlipidemia, unspecified: Secondary | ICD-10-CM | POA: Diagnosis present

## 2021-01-10 DIAGNOSIS — D6959 Other secondary thrombocytopenia: Secondary | ICD-10-CM | POA: Diagnosis present

## 2021-01-10 HISTORY — PX: CORONARY ARTERY BYPASS GRAFT: SHX141

## 2021-01-10 HISTORY — PX: TEE WITHOUT CARDIOVERSION: SHX5443

## 2021-01-10 HISTORY — PX: RADIAL ARTERY HARVEST: SHX5067

## 2021-01-10 LAB — CBC
HCT: 30.9 % — ABNORMAL LOW (ref 39.0–52.0)
HCT: 21.5 % — ABNORMAL LOW (ref 39.0–52.0)
Hemoglobin: 10.4 g/dL — ABNORMAL LOW (ref 13.0–17.0)
Hemoglobin: 7.3 g/dL — ABNORMAL LOW (ref 13.0–17.0)
MCH: 28 pg (ref 26.0–34.0)
MCH: 28.1 pg (ref 26.0–34.0)
MCHC: 33.7 g/dL (ref 30.0–36.0)
MCHC: 34 g/dL (ref 30.0–36.0)
MCV: 83.3 fL (ref 80.0–100.0)
MCV: 82.7 fL (ref 80.0–100.0)
Platelets: 93 10*3/uL — ABNORMAL LOW (ref 150–400)
Platelets: 133 10*3/uL — ABNORMAL LOW (ref 150–400)
RBC: 3.71 MIL/uL — ABNORMAL LOW (ref 4.22–5.81)
RBC: 2.6 MIL/uL — ABNORMAL LOW (ref 4.22–5.81)
RDW: 14.1 % (ref 11.5–15.5)
RDW: 14.4 % (ref 11.5–15.5)
WBC: 5.5 10*3/uL (ref 4.0–10.5)
WBC: 10.3 10*3/uL (ref 4.0–10.5)
nRBC: 0 % (ref 0.0–0.2)
nRBC: 0 % (ref 0.0–0.2)

## 2021-01-10 LAB — POCT I-STAT 7, (LYTES, BLD GAS, ICA,H+H)
Acid-Base Excess: 1 mmol/L (ref 0.0–2.0)
Acid-base deficit: 1 mmol/L (ref 0.0–2.0)
Acid-base deficit: 1 mmol/L (ref 0.0–2.0)
Acid-base deficit: 2 mmol/L (ref 0.0–2.0)
Acid-base deficit: 1 mmol/L (ref 0.0–2.0)
Acid-base deficit: 3 mmol/L — ABNORMAL HIGH (ref 0.0–2.0)
Acid-base deficit: 4 mmol/L — ABNORMAL HIGH (ref 0.0–2.0)
Acid-base deficit: 4 mmol/L — ABNORMAL HIGH (ref 0.0–2.0)
Bicarbonate: 24.2 mmol/L (ref 20.0–28.0)
Bicarbonate: 24.6 mmol/L (ref 20.0–28.0)
Bicarbonate: 24.7 mmol/L (ref 20.0–28.0)
Bicarbonate: 22.5 mmol/L (ref 20.0–28.0)
Bicarbonate: 25.4 mmol/L (ref 20.0–28.0)
Bicarbonate: 22.1 mmol/L (ref 20.0–28.0)
Bicarbonate: 21.7 mmol/L (ref 20.0–28.0)
Bicarbonate: 21.1 mmol/L (ref 20.0–28.0)
Calcium, Ion: 1.27 mmol/L (ref 1.15–1.40)
Calcium, Ion: 1.04 mmol/L — ABNORMAL LOW (ref 1.15–1.40)
Calcium, Ion: 1.07 mmol/L — ABNORMAL LOW (ref 1.15–1.40)
Calcium, Ion: 1.04 mmol/L — ABNORMAL LOW (ref 1.15–1.40)
Calcium, Ion: 1.09 mmol/L — ABNORMAL LOW (ref 1.15–1.40)
Calcium, Ion: 1.28 mmol/L (ref 1.15–1.40)
Calcium, Ion: 1.25 mmol/L (ref 1.15–1.40)
Calcium, Ion: 1.12 mmol/L — ABNORMAL LOW (ref 1.15–1.40)
HCT: 38 % — ABNORMAL LOW (ref 39.0–52.0)
HCT: 30 % — ABNORMAL LOW (ref 39.0–52.0)
HCT: 31 % — ABNORMAL LOW (ref 39.0–52.0)
HCT: 30 % — ABNORMAL LOW (ref 39.0–52.0)
HCT: 31 % — ABNORMAL LOW (ref 39.0–52.0)
HCT: 27 % — ABNORMAL LOW (ref 39.0–52.0)
HCT: 28 % — ABNORMAL LOW (ref 39.0–52.0)
HCT: 17 % — ABNORMAL LOW (ref 39.0–52.0)
Hemoglobin: 12.9 g/dL — ABNORMAL LOW (ref 13.0–17.0)
Hemoglobin: 10.2 g/dL — ABNORMAL LOW (ref 13.0–17.0)
Hemoglobin: 10.5 g/dL — ABNORMAL LOW (ref 13.0–17.0)
Hemoglobin: 10.2 g/dL — ABNORMAL LOW (ref 13.0–17.0)
Hemoglobin: 10.5 g/dL — ABNORMAL LOW (ref 13.0–17.0)
Hemoglobin: 9.2 g/dL — ABNORMAL LOW (ref 13.0–17.0)
Hemoglobin: 9.5 g/dL — ABNORMAL LOW (ref 13.0–17.0)
Hemoglobin: 5.8 g/dL — CL (ref 13.0–17.0)
O2 Saturation: 100 %
O2 Saturation: 100 %
O2 Saturation: 81 %
O2 Saturation: 100 %
O2 Saturation: 100 %
O2 Saturation: 98 %
O2 Saturation: 99 %
O2 Saturation: 99 %
Patient temperature: 37
Patient temperature: 38.1
Potassium: 4.1 mmol/L (ref 3.5–5.1)
Potassium: 4.6 mmol/L (ref 3.5–5.1)
Potassium: 4.9 mmol/L (ref 3.5–5.1)
Potassium: 5.3 mmol/L — ABNORMAL HIGH (ref 3.5–5.1)
Potassium: 5.5 mmol/L — ABNORMAL HIGH (ref 3.5–5.1)
Potassium: 4.4 mmol/L (ref 3.5–5.1)
Potassium: 4.2 mmol/L (ref 3.5–5.1)
Potassium: 4 mmol/L (ref 3.5–5.1)
Sodium: 141 mmol/L (ref 135–145)
Sodium: 142 mmol/L (ref 135–145)
Sodium: 142 mmol/L (ref 135–145)
Sodium: 140 mmol/L (ref 135–145)
Sodium: 140 mmol/L (ref 135–145)
Sodium: 142 mmol/L (ref 135–145)
Sodium: 145 mmol/L (ref 135–145)
Sodium: 144 mmol/L (ref 135–145)
TCO2: 26 mmol/L (ref 22–32)
TCO2: 26 mmol/L (ref 22–32)
TCO2: 26 mmol/L (ref 22–32)
TCO2: 23 mmol/L (ref 22–32)
TCO2: 27 mmol/L (ref 22–32)
TCO2: 23 mmol/L (ref 22–32)
TCO2: 23 mmol/L (ref 22–32)
TCO2: 22 mmol/L (ref 22–32)
pCO2 arterial: 42.6 mmHg (ref 32.0–48.0)
pCO2 arterial: 43.1 mmHg (ref 32.0–48.0)
pCO2 arterial: 50.6 mmHg — ABNORMAL HIGH (ref 32.0–48.0)
pCO2 arterial: 33 mmHg (ref 32.0–48.0)
pCO2 arterial: 38.6 mmHg (ref 32.0–48.0)
pCO2 arterial: 36.7 mmHg (ref 32.0–48.0)
pCO2 arterial: 43 mmHg (ref 32.0–48.0)
pCO2 arterial: 38.5 mmHg (ref 32.0–48.0)
pH, Arterial: 7.363 (ref 7.350–7.450)
pH, Arterial: 7.297 — ABNORMAL LOW (ref 7.350–7.450)
pH, Arterial: 7.365 (ref 7.350–7.450)
pH, Arterial: 7.425 (ref 7.350–7.450)
pH, Arterial: 7.442 (ref 7.350–7.450)
pH, Arterial: 7.389 (ref 7.350–7.450)
pH, Arterial: 7.311 — ABNORMAL LOW (ref 7.350–7.450)
pH, Arterial: 7.351 (ref 7.350–7.450)
pO2, Arterial: 493 mmHg — ABNORMAL HIGH (ref 83.0–108.0)
pO2, Arterial: 362 mmHg — ABNORMAL HIGH (ref 83.0–108.0)
pO2, Arterial: 51 mmHg — ABNORMAL LOW (ref 83.0–108.0)
pO2, Arterial: 240 mmHg — ABNORMAL HIGH (ref 83.0–108.0)
pO2, Arterial: 362 mmHg — ABNORMAL HIGH (ref 83.0–108.0)
pO2, Arterial: 114 mmHg — ABNORMAL HIGH (ref 83.0–108.0)
pO2, Arterial: 159 mmHg — ABNORMAL HIGH (ref 83.0–108.0)
pO2, Arterial: 168 mmHg — ABNORMAL HIGH (ref 83.0–108.0)

## 2021-01-10 LAB — POCT I-STAT, CHEM 8
BUN: 11 mg/dL (ref 6–20)
BUN: 10 mg/dL (ref 6–20)
BUN: 11 mg/dL (ref 6–20)
BUN: 10 mg/dL (ref 6–20)
Calcium, Ion: 1.33 mmol/L (ref 1.15–1.40)
Calcium, Ion: 1.08 mmol/L — ABNORMAL LOW (ref 1.15–1.40)
Calcium, Ion: 1.1 mmol/L — ABNORMAL LOW (ref 1.15–1.40)
Calcium, Ion: 1.25 mmol/L (ref 1.15–1.40)
Chloride: 107 mmol/L (ref 98–111)
Chloride: 102 mmol/L (ref 98–111)
Chloride: 105 mmol/L (ref 98–111)
Chloride: 108 mmol/L (ref 98–111)
Creatinine, Ser: 0.8 mg/dL (ref 0.61–1.24)
Creatinine, Ser: 0.7 mg/dL (ref 0.61–1.24)
Creatinine, Ser: 0.8 mg/dL (ref 0.61–1.24)
Creatinine, Ser: 0.8 mg/dL (ref 0.61–1.24)
Glucose, Bld: 82 mg/dL (ref 70–99)
Glucose, Bld: 79 mg/dL (ref 70–99)
Glucose, Bld: 92 mg/dL (ref 70–99)
Glucose, Bld: 116 mg/dL — ABNORMAL HIGH (ref 70–99)
HCT: 34 % — ABNORMAL LOW (ref 39.0–52.0)
HCT: 32 % — ABNORMAL LOW (ref 39.0–52.0)
HCT: 33 % — ABNORMAL LOW (ref 39.0–52.0)
HCT: 29 % — ABNORMAL LOW (ref 39.0–52.0)
Hemoglobin: 11.6 g/dL — ABNORMAL LOW (ref 13.0–17.0)
Hemoglobin: 10.9 g/dL — ABNORMAL LOW (ref 13.0–17.0)
Hemoglobin: 11.2 g/dL — ABNORMAL LOW (ref 13.0–17.0)
Hemoglobin: 9.9 g/dL — ABNORMAL LOW (ref 13.0–17.0)
Potassium: 4.1 mmol/L (ref 3.5–5.1)
Potassium: 4.7 mmol/L (ref 3.5–5.1)
Potassium: 5.3 mmol/L — ABNORMAL HIGH (ref 3.5–5.1)
Potassium: 4.4 mmol/L (ref 3.5–5.1)
Sodium: 139 mmol/L (ref 135–145)
Sodium: 142 mmol/L (ref 135–145)
Sodium: 139 mmol/L (ref 135–145)
Sodium: 141 mmol/L (ref 135–145)
TCO2: 25 mmol/L (ref 22–32)
TCO2: 25 mmol/L (ref 22–32)
TCO2: 24 mmol/L (ref 22–32)
TCO2: 21 mmol/L — ABNORMAL LOW (ref 22–32)

## 2021-01-10 LAB — ABO/RH: ABO/RH(D): O POS

## 2021-01-10 LAB — BASIC METABOLIC PANEL
Anion gap: 5 (ref 5–15)
BUN: 10 mg/dL (ref 6–20)
CO2: 21 mmol/L — ABNORMAL LOW (ref 22–32)
Calcium: 7.8 mg/dL — ABNORMAL LOW (ref 8.9–10.3)
Chloride: 113 mmol/L — ABNORMAL HIGH (ref 98–111)
Creatinine, Ser: 1.08 mg/dL (ref 0.61–1.24)
GFR, Estimated: >60 mL/min (ref >60)
Glucose, Bld: 136 mg/dL — ABNORMAL HIGH (ref 70–99)
Potassium: 4.3 mmol/L (ref 3.5–5.1)
Sodium: 139 mmol/L (ref 135–145)

## 2021-01-10 LAB — HEMOGLOBIN AND HEMATOCRIT, BLOOD
HCT: 30.4 % — ABNORMAL LOW (ref 39.0–52.0)
Hemoglobin: 10.4 g/dL — ABNORMAL LOW (ref 13.0–17.0)

## 2021-01-10 LAB — GLUCOSE, CAPILLARY
Glucose-Capillary: 89 mg/dL (ref 70–99)
Glucose-Capillary: 91 mg/dL (ref 70–99)
Glucose-Capillary: 115 mg/dL — ABNORMAL HIGH (ref 70–99)
Glucose-Capillary: 91 mg/dL (ref 70–99)
Glucose-Capillary: 135 mg/dL — ABNORMAL HIGH (ref 70–99)
Glucose-Capillary: 135 mg/dL — ABNORMAL HIGH (ref 70–99)
Glucose-Capillary: 132 mg/dL — ABNORMAL HIGH (ref 70–99)
Glucose-Capillary: 173 mg/dL — ABNORMAL HIGH (ref 70–99)
Glucose-Capillary: 191 mg/dL — ABNORMAL HIGH (ref 70–99)
Glucose-Capillary: 153 mg/dL — ABNORMAL HIGH (ref 70–99)

## 2021-01-10 LAB — PROTIME-INR
INR: 1.6 — ABNORMAL HIGH (ref 0.8–1.2)
INR: 1.6 — ABNORMAL HIGH (ref 0.8–1.2)
Prothrombin Time: 18.9 seconds — ABNORMAL HIGH (ref 11.4–15.2)
Prothrombin Time: 19.1 seconds — ABNORMAL HIGH (ref 11.4–15.2)

## 2021-01-10 LAB — PREPARE RBC (CROSSMATCH)

## 2021-01-10 LAB — APTT: aPTT: 35 seconds (ref 24–36)

## 2021-01-10 LAB — MAGNESIUM: Magnesium: 2.8 mg/dL — ABNORMAL HIGH (ref 1.7–2.4)

## 2021-01-10 LAB — PLATELET COUNT: Platelets: 127 10*3/uL — ABNORMAL LOW (ref 150–400)

## 2021-01-10 LAB — FIBRINOGEN: Fibrinogen: 155 mg/dL — ABNORMAL LOW (ref 210–475)

## 2021-01-10 SURGERY — CORONARY ARTERY BYPASS GRAFTING (CABG)
Anesthesia: General | Site: Chest

## 2021-01-10 MED ORDER — ROCURONIUM BROMIDE 10 MG/ML (PF) SYRINGE
PREFILLED_SYRINGE | INTRAVENOUS | Status: AC
  Filled 2021-01-10: qty 10

## 2021-01-10 MED ORDER — CHLORHEXIDINE GLUCONATE 0.12 % MT SOLN
OROMUCOSAL | Status: AC
  Administered 2021-01-10: 15 mL via OROMUCOSAL
  Filled 2021-01-10: qty 15

## 2021-01-10 MED ORDER — SODIUM CHLORIDE (PF) 0.9 % IJ SOLN
OROMUCOSAL | Status: DC | PRN
  Administered 2021-01-10 (×2): 4 mL via TOPICAL

## 2021-01-10 MED ORDER — LIDOCAINE 2% (20 MG/ML) 5 ML SYRINGE
INTRAMUSCULAR | Status: AC
  Filled 2021-01-10: qty 5

## 2021-01-10 MED ORDER — INSULIN REGULAR(HUMAN) IN NACL 100-0.9 UT/100ML-% IV SOLN
INTRAVENOUS | Status: DC
  Administered 2021-01-10: 1 [IU]/h via INTRAVENOUS

## 2021-01-10 MED ORDER — ALBUMIN HUMAN 5 % IV SOLN: INTRAVENOUS | Status: DC | PRN

## 2021-01-10 MED ORDER — ACETAMINOPHEN 650 MG RE SUPP
650.0000 mg | Freq: Once | RECTAL | Status: AC
  Administered 2021-01-10: 650 mg via RECTAL

## 2021-01-10 MED ORDER — ASPIRIN EC 325 MG PO TBEC
325.0000 mg | DELAYED_RELEASE_TABLET | Freq: Every day | ORAL | Status: DC
  Administered 2021-01-11 – 2021-01-15 (×4): 325 mg via ORAL
  Filled 2021-01-10 (×5): qty 1

## 2021-01-10 MED ORDER — EPHEDRINE 5 MG/ML INJ
INTRAVENOUS | Status: AC
  Filled 2021-01-10: qty 10

## 2021-01-10 MED ORDER — EPHEDRINE SULFATE 50 MG/ML IJ SOLN
INTRAMUSCULAR | Status: DC | PRN
  Administered 2021-01-10: 10 mg via INTRAVENOUS
  Administered 2021-01-10 (×3): 5 mg via INTRAVENOUS
  Administered 2021-01-10: 10 mg via INTRAVENOUS
  Administered 2021-01-10: 5 mg via INTRAVENOUS

## 2021-01-10 MED ORDER — ACETAMINOPHEN 500 MG PO TABS
1000.0000 mg | ORAL_TABLET | Freq: Four times a day (QID) | ORAL | Status: DC
  Administered 2021-01-11 – 2021-01-15 (×14): 1000 mg via ORAL
  Filled 2021-01-10 (×15): qty 2

## 2021-01-10 MED ORDER — OXYCODONE HCL 5 MG PO TABS
5.0000 mg | ORAL_TABLET | ORAL | Status: DC | PRN
  Administered 2021-01-11 – 2021-01-12 (×6): 10 mg via ORAL
  Filled 2021-01-10 (×6): qty 2

## 2021-01-10 MED ORDER — BISACODYL 5 MG PO TBEC
10.0000 mg | DELAYED_RELEASE_TABLET | Freq: Every day | ORAL | Status: DC
  Administered 2021-01-11 – 2021-01-13 (×3): 10 mg via ORAL
  Filled 2021-01-10 (×3): qty 2

## 2021-01-10 MED ORDER — MIDAZOLAM HCL (PF) 10 MG/2ML IJ SOLN
INTRAMUSCULAR | Status: AC
  Filled 2021-01-10: qty 2

## 2021-01-10 MED ORDER — FENTANYL CITRATE (PF) 250 MCG/5ML IJ SOLN
INTRAMUSCULAR | Status: AC
  Filled 2021-01-10: qty 5

## 2021-01-10 MED ORDER — ROCURONIUM BROMIDE 100 MG/10ML IV SOLN
INTRAVENOUS | Status: DC | PRN
  Administered 2021-01-10 (×2): 50 mg via INTRAVENOUS
  Administered 2021-01-10: 60 mg via INTRAVENOUS
  Administered 2021-01-10: 50 mg via INTRAVENOUS
  Administered 2021-01-10: 40 mg via INTRAVENOUS

## 2021-01-10 MED ORDER — ALBUMIN HUMAN 5 % IV SOLN
250.0000 mL | INTRAVENOUS | Status: AC | PRN
  Administered 2021-01-10 (×4): 12.5 g via INTRAVENOUS
  Filled 2021-01-10 (×3): qty 250

## 2021-01-10 MED ORDER — DEXTROSE 50 % IV SOLN: 0.0000 mL | INTRAVENOUS | Status: DC | PRN

## 2021-01-10 MED ORDER — ATORVASTATIN CALCIUM 80 MG PO TABS
80.0000 mg | ORAL_TABLET | Freq: Every day | ORAL | Status: DC
  Administered 2021-01-11 – 2021-01-14 (×4): 80 mg via ORAL
  Filled 2021-01-10 (×4): qty 1

## 2021-01-10 MED ORDER — DOCUSATE SODIUM 100 MG PO CAPS
200.0000 mg | ORAL_CAPSULE | Freq: Every day | ORAL | Status: DC
  Administered 2021-01-11 – 2021-01-15 (×4): 200 mg via ORAL
  Filled 2021-01-10 (×5): qty 2

## 2021-01-10 MED ORDER — LACTATED RINGERS IV SOLN: INTRAVENOUS | Status: DC

## 2021-01-10 MED ORDER — PLASMA-LYTE 148 IV SOLN
INTRAVENOUS | Status: DC | PRN
  Administered 2021-01-10: 500 mL via INTRAVASCULAR

## 2021-01-10 MED ORDER — METOPROLOL TARTRATE 5 MG/5ML IV SOLN: 2.5000 mg | INTRAVENOUS | Status: DC | PRN

## 2021-01-10 MED ORDER — PROPOFOL 10 MG/ML IV BOLUS
INTRAVENOUS | Status: AC
  Filled 2021-01-10: qty 40

## 2021-01-10 MED ORDER — TRAMADOL HCL 50 MG PO TABS
50.0000 mg | ORAL_TABLET | ORAL | Status: DC | PRN
  Administered 2021-01-11 – 2021-01-14 (×7): 100 mg via ORAL
  Filled 2021-01-10 (×8): qty 2

## 2021-01-10 MED ORDER — DEXMEDETOMIDINE HCL IN NACL 400 MCG/100ML IV SOLN
0.0000 ug/kg/h | INTRAVENOUS | Status: DC
  Administered 2021-01-10: 0.6 ug/kg/h via INTRAVENOUS
  Filled 2021-01-10: qty 100

## 2021-01-10 MED ORDER — NOREPINEPHRINE 4 MG/250ML-% IV SOLN: 0.0000 ug/min | INTRAVENOUS | Status: DC

## 2021-01-10 MED ORDER — FAMOTIDINE IN NACL 20-0.9 MG/50ML-% IV SOLN
20.0000 mg | Freq: Two times a day (BID) | INTRAVENOUS | Status: AC
  Administered 2021-01-10 (×2): 20 mg via INTRAVENOUS
  Filled 2021-01-10 (×2): qty 50

## 2021-01-10 MED ORDER — ACETAMINOPHEN 160 MG/5ML PO SOLN
1000.0000 mg | Freq: Four times a day (QID) | ORAL | Status: DC
  Administered 2021-01-11: 1000 mg
  Filled 2021-01-10: qty 40.6

## 2021-01-10 MED ORDER — SODIUM CHLORIDE 0.9% IV SOLUTION: Freq: Once | INTRAVENOUS | Status: AC

## 2021-01-10 MED ORDER — PHENYLEPHRINE 40 MCG/ML (10ML) SYRINGE FOR IV PUSH (FOR BLOOD PRESSURE SUPPORT)
PREFILLED_SYRINGE | INTRAVENOUS | Status: DC | PRN
  Administered 2021-01-10 (×2): 80 ug via INTRAVENOUS

## 2021-01-10 MED ORDER — SODIUM CHLORIDE 0.45 % IV SOLN: INTRAVENOUS | Status: DC | PRN

## 2021-01-10 MED ORDER — HEPARIN SODIUM (PORCINE) 1000 UNIT/ML IJ SOLN
INTRAMUSCULAR | Status: DC | PRN
  Administered 2021-01-10: 31000 [IU] via INTRAVENOUS

## 2021-01-10 MED ORDER — CHLORHEXIDINE GLUCONATE 0.12 % MT SOLN
15.0000 mL | OROMUCOSAL | Status: AC
  Administered 2021-01-10: 15 mL via OROMUCOSAL
  Filled 2021-01-10: qty 15

## 2021-01-10 MED ORDER — BISACODYL 10 MG RE SUPP: 10.0000 mg | Freq: Every day | RECTAL | Status: DC

## 2021-01-10 MED ORDER — NICARDIPINE HCL IN NACL 20-0.86 MG/200ML-% IV SOLN
5.0000 mg/h | INTRAVENOUS | Status: DC
  Administered 2021-01-10 – 2021-01-11 (×3): 5 mg/h via INTRAVENOUS
  Filled 2021-01-10 (×5): qty 200

## 2021-01-10 MED ORDER — SODIUM CHLORIDE 0.9 % IV SOLN: 250.0000 mL | INTRAVENOUS | Status: DC

## 2021-01-10 MED ORDER — CEFAZOLIN SODIUM-DEXTROSE 2-4 GM/100ML-% IV SOLN
2.0000 g | Freq: Three times a day (TID) | INTRAVENOUS | Status: AC
  Administered 2021-01-10 – 2021-01-12 (×6): 2 g via INTRAVENOUS
  Filled 2021-01-10 (×6): qty 100

## 2021-01-10 MED ORDER — NOREPINEPHRINE 4 MG/250ML-% IV SOLN
INTRAVENOUS | Status: AC
  Administered 2021-01-10: 4 ug/min via INTRAVENOUS
  Filled 2021-01-10: qty 250

## 2021-01-10 MED ORDER — SODIUM CHLORIDE 0.9% FLUSH
10.0000 mL | Freq: Two times a day (BID) | INTRAVENOUS | Status: DC
  Administered 2021-01-10 – 2021-01-14 (×6): 10 mL

## 2021-01-10 MED ORDER — TAMSULOSIN HCL 0.4 MG PO CAPS
0.4000 mg | ORAL_CAPSULE | Freq: Every day | ORAL | Status: DC
  Administered 2021-01-12 – 2021-01-15 (×4): 0.4 mg via ORAL
  Filled 2021-01-10 (×4): qty 1

## 2021-01-10 MED ORDER — CHLORHEXIDINE GLUCONATE 4 % EX LIQD: 30.0000 mL | CUTANEOUS | Status: DC

## 2021-01-10 MED ORDER — METOPROLOL TARTRATE 12.5 MG HALF TABLET
12.5000 mg | ORAL_TABLET | Freq: Two times a day (BID) | ORAL | Status: DC
  Administered 2021-01-12 – 2021-01-13 (×2): 12.5 mg via ORAL
  Filled 2021-01-10 (×4): qty 1

## 2021-01-10 MED ORDER — ACETAMINOPHEN 160 MG/5ML PO SOLN: 650.0000 mg | Freq: Once | ORAL | Status: AC

## 2021-01-10 MED ORDER — SODIUM CHLORIDE 0.9 % IV SOLN
INTRAVENOUS | Status: DC | PRN
  Administered 2021-01-10: 25 ug/min via INTRAVENOUS

## 2021-01-10 MED ORDER — ORAL CARE MOUTH RINSE
15.0000 mL | OROMUCOSAL | Status: DC
  Administered 2021-01-10 – 2021-01-11 (×7): 15 mL via OROMUCOSAL

## 2021-01-10 MED ORDER — SODIUM CHLORIDE 0.9% FLUSH
3.0000 mL | Freq: Two times a day (BID) | INTRAVENOUS | Status: DC
  Administered 2021-01-11 – 2021-01-14 (×7): 3 mL via INTRAVENOUS

## 2021-01-10 MED ORDER — SODIUM CHLORIDE 0.9% FLUSH: 3.0000 mL | INTRAVENOUS | Status: DC | PRN

## 2021-01-10 MED ORDER — LACTATED RINGERS IV SOLN
500.0000 mL | Freq: Once | INTRAVENOUS | Status: AC | PRN
  Administered 2021-01-10: 500 mL via INTRAVENOUS

## 2021-01-10 MED ORDER — PROPOFOL 10 MG/ML IV BOLUS
INTRAVENOUS | Status: DC | PRN
  Administered 2021-01-10 (×2): 50 mg via INTRAVENOUS

## 2021-01-10 MED ORDER — SODIUM CHLORIDE 0.9% FLUSH: 10.0000 mL | INTRAVENOUS | Status: DC | PRN

## 2021-01-10 MED ORDER — MORPHINE SULFATE (PF) 2 MG/ML IV SOLN
1.0000 mg | INTRAVENOUS | Status: DC | PRN
  Administered 2021-01-11: 1 mg via INTRAVENOUS
  Administered 2021-01-11: 4 mg via INTRAVENOUS
  Administered 2021-01-11 (×4): 2 mg via INTRAVENOUS
  Administered 2021-01-12: 4 mg via INTRAVENOUS
  Administered 2021-01-13: 2 mg via INTRAVENOUS
  Filled 2021-01-10 (×2): qty 2
  Filled 2021-01-10 (×6): qty 1

## 2021-01-10 MED ORDER — VANCOMYCIN HCL IN DEXTROSE 1-5 GM/200ML-% IV SOLN
1000.0000 mg | Freq: Once | INTRAVENOUS | Status: AC
  Administered 2021-01-10: 1000 mg via INTRAVENOUS
  Filled 2021-01-10: qty 200

## 2021-01-10 MED ORDER — METOPROLOL TARTRATE 25 MG/10 ML ORAL SUSPENSION: 12.5000 mg | Freq: Two times a day (BID) | ORAL | Status: DC

## 2021-01-10 MED ORDER — FENTANYL CITRATE (PF) 250 MCG/5ML IJ SOLN
INTRAMUSCULAR | Status: AC
  Filled 2021-01-10: qty 25

## 2021-01-10 MED ORDER — MIDAZOLAM HCL 2 MG/2ML IJ SOLN: 2.0000 mg | INTRAMUSCULAR | Status: DC | PRN

## 2021-01-10 MED ORDER — NOREPINEPHRINE 4 MG/250ML-% IV SOLN
0.0000 ug/min | INTRAVENOUS | Status: DC
  Administered 2021-01-10: 3 ug/min via INTRAVENOUS
  Filled 2021-01-10: qty 250

## 2021-01-10 MED ORDER — ASPIRIN 81 MG PO CHEW
324.0000 mg | CHEWABLE_TABLET | Freq: Every day | ORAL | Status: DC
  Administered 2021-01-12: 324 mg
  Filled 2021-01-10 (×3): qty 4

## 2021-01-10 MED ORDER — CHLORHEXIDINE GLUCONATE CLOTH 2 % EX PADS
6.0000 | MEDICATED_PAD | Freq: Every day | CUTANEOUS | Status: DC
  Administered 2021-01-11 – 2021-01-14 (×5): 6 via TOPICAL

## 2021-01-10 MED ORDER — ONDANSETRON HCL 4 MG/2ML IJ SOLN
4.0000 mg | Freq: Four times a day (QID) | INTRAMUSCULAR | Status: DC | PRN
  Administered 2021-01-13: 4 mg via INTRAVENOUS
  Filled 2021-01-10: qty 2

## 2021-01-10 MED ORDER — ~~LOC~~ CARDIAC SURGERY, PATIENT & FAMILY EDUCATION
Freq: Once | Status: DC
  Filled 2021-01-10: qty 1

## 2021-01-10 MED ORDER — PROTAMINE SULFATE 10 MG/ML IV SOLN
INTRAVENOUS | Status: AC
  Filled 2021-01-10: qty 25

## 2021-01-10 MED ORDER — PANTOPRAZOLE SODIUM 40 MG PO TBEC: 40.0000 mg | DELAYED_RELEASE_TABLET | Freq: Every day | ORAL | Status: DC

## 2021-01-10 MED ORDER — SODIUM CHLORIDE 0.9 % IV SOLN: INTRAVENOUS | Status: DC

## 2021-01-10 MED ORDER — FENTANYL CITRATE (PF) 250 MCG/5ML IJ SOLN
INTRAMUSCULAR | Status: DC | PRN
  Administered 2021-01-10: 250 ug via INTRAVENOUS
  Administered 2021-01-10: 50 ug via INTRAVENOUS
  Administered 2021-01-10: 200 ug via INTRAVENOUS
  Administered 2021-01-10: 250 ug via INTRAVENOUS
  Administered 2021-01-10: 50 ug via INTRAVENOUS
  Administered 2021-01-10: 150 ug via INTRAVENOUS
  Administered 2021-01-10: 250 ug via INTRAVENOUS
  Administered 2021-01-10: 150 ug via INTRAVENOUS
  Administered 2021-01-10 (×2): 100 ug via INTRAVENOUS
  Administered 2021-01-10: 50 ug via INTRAVENOUS
  Administered 2021-01-10: 150 ug via INTRAVENOUS

## 2021-01-10 MED ORDER — CHLORHEXIDINE GLUCONATE 0.12 % MT SOLN
15.0000 mL | Freq: Once | OROMUCOSAL | Status: AC
  Administered 2021-01-10: 15 mL via OROMUCOSAL

## 2021-01-10 MED ORDER — MIDAZOLAM HCL 5 MG/5ML IJ SOLN
INTRAMUSCULAR | Status: DC | PRN
  Administered 2021-01-10 (×2): 2 mg via INTRAVENOUS
  Administered 2021-01-10 (×2): 3 mg via INTRAVENOUS

## 2021-01-10 MED ORDER — LACTATED RINGERS IV SOLN: INTRAVENOUS | Status: DC | PRN

## 2021-01-10 MED ORDER — MAGNESIUM SULFATE 4 GM/100ML IV SOLN
4.0000 g | Freq: Once | INTRAVENOUS | Status: AC
  Administered 2021-01-10: 4 g via INTRAVENOUS
  Filled 2021-01-10: qty 100

## 2021-01-10 MED ORDER — PROTAMINE SULFATE 10 MG/ML IV SOLN
INTRAVENOUS | Status: DC | PRN
  Administered 2021-01-10: 30 mg via INTRAVENOUS
  Administered 2021-01-10: 250 mg via INTRAVENOUS

## 2021-01-10 MED ORDER — POTASSIUM CHLORIDE 10 MEQ/50ML IV SOLN
10.0000 meq | INTRAVENOUS | Status: AC
Start: 2021-01-10 — End: 2021-01-10

## 2021-01-10 MED ORDER — METOPROLOL TARTRATE 12.5 MG HALF TABLET: 12.5000 mg | ORAL_TABLET | Freq: Once | ORAL | Status: DC

## 2021-01-10 MED ORDER — 0.9 % SODIUM CHLORIDE (POUR BTL) OPTIME
TOPICAL | Status: DC | PRN
  Administered 2021-01-10: 4000 mL

## 2021-01-10 MED ORDER — CHLORHEXIDINE GLUCONATE 0.12% ORAL RINSE (MEDLINE KIT)
15.0000 mL | Freq: Two times a day (BID) | OROMUCOSAL | Status: DC
  Administered 2021-01-10: 15 mL via OROMUCOSAL

## 2021-01-10 SURGICAL SUPPLY — 105 items
APPLIER CLIP 9.375 SM OPEN (CLIP)
BAG DECANTER FOR FLEXI CONT (MISCELLANEOUS) ×4 IMPLANT
BENZOIN TINCTURE PRP APPL 2/3 (GAUZE/BANDAGES/DRESSINGS) ×4 IMPLANT
BLADE CLIPPER SURG (BLADE) ×8 IMPLANT
BLADE STERNUM SYSTEM 6 (BLADE) ×4 IMPLANT
BLADE SURG 15 STRL LF DISP TIS (BLADE) ×3 IMPLANT
BLADE SURG 15 STRL SS (BLADE) ×4
BNDG ELASTIC 4X5.8 VLCR STR LF (GAUZE/BANDAGES/DRESSINGS) ×4 IMPLANT
BNDG ELASTIC 6X5.8 VLCR STR LF (GAUZE/BANDAGES/DRESSINGS) ×4 IMPLANT
BNDG GAUZE ELAST 4 BULKY (GAUZE/BANDAGES/DRESSINGS) ×4 IMPLANT
CABLE SURGICAL S-101-97-12 (CABLE) ×4 IMPLANT
CANISTER SUCT 3000ML PPV (MISCELLANEOUS) ×4 IMPLANT
CANNULA MC2 2 STG 29/37 NON-V (CANNULA) ×3 IMPLANT
CANNULA MC2 TWO STAGE (CANNULA) ×4
CANNULA NON VENT 20FR 12 (CANNULA) ×4 IMPLANT
CATH ROBINSON RED A/P 18FR (CATHETERS) ×8 IMPLANT
CLIP APPLIE 9.375 SM OPEN (CLIP) IMPLANT
CLIP RETRACTION 3.0MM CORONARY (MISCELLANEOUS) ×4 IMPLANT
CLIP VESOCCLUDE MED 24/CT (CLIP) IMPLANT
CLIP VESOCCLUDE SM WIDE 24/CT (CLIP) IMPLANT
CONN ST 1/2X1/2  BEN (MISCELLANEOUS) ×4
CONN ST 1/2X1/2 BEN (MISCELLANEOUS) ×3 IMPLANT
CONNECTOR BLAKE 2:1 CARIO BLK (MISCELLANEOUS) ×4 IMPLANT
CONTAINER PROTECT SURGISLUSH (MISCELLANEOUS) ×4 IMPLANT
COVER MAYO STAND STRL (DRAPES) ×4 IMPLANT
CUFF TOURN SGL QUICK 18X4 (TOURNIQUET CUFF) IMPLANT
CUFF TOURN SGL QUICK 24 (TOURNIQUET CUFF)
CUFF TRNQT CYL 24X4X16.5-23 (TOURNIQUET CUFF) IMPLANT
DERMABOND ADVANCED (GAUZE/BANDAGES/DRESSINGS) ×1
DERMABOND ADVANCED .7 DNX12 (GAUZE/BANDAGES/DRESSINGS) ×3 IMPLANT
DRAIN CHANNEL 19F RND (DRAIN) ×12 IMPLANT
DRAIN CONNECTOR BLAKE 1:1 (MISCELLANEOUS) ×4 IMPLANT
DRAPE CARDIOVASCULAR INCISE (DRAPES) ×4
DRAPE CV SPLIT W-CLR ANES SCRN (DRAPES) IMPLANT
DRAPE EXTREMITY T 121X128X90 (DISPOSABLE) ×4 IMPLANT
DRAPE HALF SHEET 40X57 (DRAPES) ×4 IMPLANT
DRAPE INCISE IOBAN 66X45 STRL (DRAPES) IMPLANT
DRAPE SRG 135X102X78XABS (DRAPES) ×3 IMPLANT
DRAPE WARM FLUID 44X44 (DRAPES) ×4 IMPLANT
DRSG AQUACEL AG ADV 3.5X10 (GAUZE/BANDAGES/DRESSINGS) IMPLANT
DRSG AQUACEL AG ADV 3.5X14 (GAUZE/BANDAGES/DRESSINGS) ×4 IMPLANT
DRSG COVADERM 4X14 (GAUZE/BANDAGES/DRESSINGS) ×4 IMPLANT
ELECT BLADE 4.0 EZ CLEAN MEGAD (MISCELLANEOUS) ×4
ELECT REM PT RETURN 9FT ADLT (ELECTROSURGICAL) ×8
ELECTRODE BLDE 4.0 EZ CLN MEGD (MISCELLANEOUS) ×3 IMPLANT
ELECTRODE REM PT RTRN 9FT ADLT (ELECTROSURGICAL) ×6 IMPLANT
FELT TEFLON 1X6 (MISCELLANEOUS) ×4 IMPLANT
GAUZE SPONGE 4X4 12PLY STRL (GAUZE/BANDAGES/DRESSINGS) ×12 IMPLANT
GAUZE SPONGE 4X4 12PLY STRL LF (GAUZE/BANDAGES/DRESSINGS) ×8 IMPLANT
GEL ULTRASOUND 20GR AQUASONIC (MISCELLANEOUS) ×4 IMPLANT
GLOVE BIO SURGEON STRL SZ7 (GLOVE) ×8 IMPLANT
GLOVE BIOGEL M STRL SZ7.5 (GLOVE) ×8 IMPLANT
GOWN STRL REUS W/ TWL LRG LVL3 (GOWN DISPOSABLE) ×12 IMPLANT
GOWN STRL REUS W/ TWL XL LVL3 (GOWN DISPOSABLE) ×6 IMPLANT
GOWN STRL REUS W/TWL LRG LVL3 (GOWN DISPOSABLE) ×16
GOWN STRL REUS W/TWL XL LVL3 (GOWN DISPOSABLE) ×8
HEMOSTAT POWDER SURGIFOAM 1G (HEMOSTASIS) ×12 IMPLANT
INSERT SUTURE HOLDER (MISCELLANEOUS) ×4 IMPLANT
KIT BASIN OR (CUSTOM PROCEDURE TRAY) ×4 IMPLANT
KIT SUCTION CATH 14FR (SUCTIONS) ×4 IMPLANT
KIT TURNOVER KIT B (KITS) ×8 IMPLANT
KIT VASOVIEW HEMOPRO 2 VH 4000 (KITS) ×4 IMPLANT
LEAD PACING MYOCARDI (MISCELLANEOUS) ×4 IMPLANT
MARKER GRAFT CORONARY BYPASS (MISCELLANEOUS) ×12 IMPLANT
NS IRRIG 1000ML POUR BTL (IV SOLUTION) ×20 IMPLANT
PACK ACCESSORY CANNULA KIT (KITS) ×4 IMPLANT
PACK E OPEN HEART (SUTURE) ×4 IMPLANT
PACK OPEN HEART (CUSTOM PROCEDURE TRAY) ×4 IMPLANT
PAD ARMBOARD 7.5X6 YLW CONV (MISCELLANEOUS) ×16 IMPLANT
PAD ELECT DEFIB RADIOL ZOLL (MISCELLANEOUS) ×4 IMPLANT
PENCIL BUTTON HOLSTER BLD 10FT (ELECTRODE) ×4 IMPLANT
POSITIONER HEAD DONUT 9IN (MISCELLANEOUS) ×4 IMPLANT
PUNCH AORTIC ROTATE 4.0MM (MISCELLANEOUS) ×4 IMPLANT
SET MPS 3-ND DEL (MISCELLANEOUS) ×4 IMPLANT
SHEARS HARMONIC 9CM CVD (BLADE) ×8 IMPLANT
SPONGE LAP 18X18 RF (DISPOSABLE) ×4 IMPLANT
SUPPORT HEART JANKE-BARRON (MISCELLANEOUS) ×4 IMPLANT
SUT BONE WAX W31G (SUTURE) ×4 IMPLANT
SUT ETHIBOND X763 2 0 SH 1 (SUTURE) ×8 IMPLANT
SUT MNCRL AB 3-0 PS2 18 (SUTURE) ×12 IMPLANT
SUT MNCRL AB 4-0 PS2 18 (SUTURE) ×4 IMPLANT
SUT PDS AB 1 CTX 36 (SUTURE) ×8 IMPLANT
SUT PROLENE 3 0 SH DA (SUTURE) ×4 IMPLANT
SUT PROLENE 4 0 SH DA (SUTURE) ×4 IMPLANT
SUT PROLENE 5 0 C 1 36 (SUTURE) ×24 IMPLANT
SUT PROLENE 7 0 BV 1 (SUTURE) ×28 IMPLANT
SUT PROLENE 7 0 BV1 MDA (SUTURE) ×4 IMPLANT
SUT SILK  1 MH (SUTURE) ×4
SUT SILK 1 MH (SUTURE) ×3 IMPLANT
SUT STEEL 6MS V (SUTURE) ×8 IMPLANT
SUT STEEL STERNAL CCS#1 18IN (SUTURE) ×12 IMPLANT
SUT VIC AB 2-0 CT1 27 (SUTURE) ×8
SUT VIC AB 2-0 CT1 TAPERPNT 27 (SUTURE) ×6 IMPLANT
SUT VIC AB 3-0 SH 27 (SUTURE)
SUT VIC AB 3-0 SH 27X BRD (SUTURE) IMPLANT
SUT VIC AB 3-0 X1 27 (SUTURE) IMPLANT
SYR 50ML SLIP (SYRINGE) IMPLANT
SYSTEM SAHARA CHEST DRAIN ATS (WOUND CARE) ×4 IMPLANT
TAPE CLOTH SURG 4X10 WHT LF (GAUZE/BANDAGES/DRESSINGS) ×4 IMPLANT
TOWEL GREEN STERILE (TOWEL DISPOSABLE) ×8 IMPLANT
TOWEL GREEN STERILE FF (TOWEL DISPOSABLE) ×8 IMPLANT
TRAY FOLEY SLVR 16FR TEMP STAT (SET/KITS/TRAYS/PACK) ×4 IMPLANT
TUBING LAP HI FLOW INSUFFLATIO (TUBING) ×4 IMPLANT
UNDERPAD 30X36 HEAVY ABSORB (UNDERPADS AND DIAPERS) ×8 IMPLANT
WATER STERILE IRR 1000ML POUR (IV SOLUTION) ×8 IMPLANT

## 2021-01-10 NOTE — Progress Notes (Signed)
Rapid Wean Protocol initiated at this time 

## 2021-01-10 NOTE — Anesthesia Procedure Notes (Signed)
Procedure Name: Intubation Date/Time: 01/10/2021 8:14 AM Performed by: Mariea Clonts, CRNA Pre-anesthesia Checklist: Patient identified, Emergency Drugs available, Suction available and Patient being monitored Patient Re-evaluated:Patient Re-evaluated prior to induction Oxygen Delivery Method: Circle system utilized Preoxygenation: Pre-oxygenation with 100% oxygen Induction Type: IV induction Ventilation: Oral airway inserted - appropriate to patient size and Mask ventilation without difficulty Laryngoscope Size: Miller and 2 Grade View: Grade I Tube type: Oral Tube size: 8.0 mm Number of attempts: 1 Airway Equipment and Method: Stylet Placement Confirmation: ETT inserted through vocal cords under direct vision Secured at: 23 cm Tube secured with: Tape Dental Injury: Teeth and Oropharynx as per pre-operative assessment  Comments: Performed by Jacqualine Code

## 2021-01-10 NOTE — Anesthesia Preprocedure Evaluation (Signed)
Anesthesia Evaluation  Patient identified by MRN, date of birth, ID band Patient awake    Reviewed: Allergy & Precautions, H&P , NPO status , Patient's Chart, lab work & pertinent test results  Airway Mallampati: II   Neck ROM: full    Dental   Pulmonary sleep apnea , former smoker,    breath sounds clear to auscultation       Cardiovascular hypertension, + CAD   Rhythm:regular Rate:Normal  TTE (11/2020): EF 55%, no RWMA. Severely dilated LA   Neuro/Psych    GI/Hepatic GERD  ,  Endo/Other    Renal/GU      Musculoskeletal  (+) Arthritis ,   Abdominal   Peds  Hematology   Anesthesia Other Findings   Reproductive/Obstetrics                             Anesthesia Physical Anesthesia Plan  ASA: III  Anesthesia Plan: General   Post-op Pain Management:    Induction: Intravenous  PONV Risk Score and Plan: 2 and Ondansetron, Dexamethasone, Midazolam and Treatment may vary due to age or medical condition  Airway Management Planned: Oral ETT  Additional Equipment: Arterial line, CVP, TEE and Ultrasound Guidance Line Placement  Intra-op Plan:   Post-operative Plan: Possible Post-op intubation/ventilation  Informed Consent: I have reviewed the patients History and Physical, chart, labs and discussed the procedure including the risks, benefits and alternatives for the proposed anesthesia with the patient or authorized representative who has indicated his/her understanding and acceptance.     Dental advisory given  Plan Discussed with: CRNA, Anesthesiologist and Surgeon  Anesthesia Plan Comments:         Anesthesia Quick Evaluation

## 2021-01-10 NOTE — Discharge Instructions (Signed)

## 2021-01-10 NOTE — Progress Notes (Signed)
     [address]       [place], [address]             [phone]       Awake Moderate chest tube output Great urine output over 200/hr for last 3 hrs Hypotensive on 9 of levophed Thrombocytopenic and elevated INR  Will keep intubate for now Will transfuse 1 of ffp and plts  Kevan Prouty O Lion Fernandez

## 2021-01-10 NOTE — Progress Notes (Signed)
Patient ID: Claudell Wohler, male   DOB: 09-14-1960, 60 y.o.   MRN: [id number]  TCTS Evening Rounds:   Hemodynamically stable  CI = 2.7  Awake on vent.   Urine output good  CT output a little high postop and given FFP and plts due to coagulopathy. Last hr down to 150 cc.  CBC    Component Value Date/Time   WBC 5.5 01/10/2021 1417   RBC 3.71 (L) 01/10/2021 1417   HGB 10.4 (L) 01/10/2021 1417   HGB 14.2 12/19/2020 1122   HCT 30.9 (L) 01/10/2021 1417   HCT 41.6 12/19/2020 1122   PLT 93 (L) 01/10/2021 1417   PLT 163 12/19/2020 1122   MCV 83.3 01/10/2021 1417   MCV 81 12/19/2020 1122   MCH 28.0 01/10/2021 1417   MCHC 33.7 01/10/2021 1417   RDW 14.1 01/10/2021 1417   RDW 13.2 12/19/2020 1122   LYMPHSABS 2.2 08/14/2018 1155   MONOABS 0.7 08/14/2018 1155   EOSABS 0.2 08/14/2018 1155   BASOSABS 0.1 08/14/2018 1155     BMET    Component Value Date/Time   NA 141 01/10/2021 1313   NA 139 12/19/2020 1122   K 4.4 01/10/2021 1313   CL 108 01/10/2021 1313   CO2 20 (L) 01/06/2021 0937   GLUCOSE 116 (H) 01/10/2021 1313   BUN 10 01/10/2021 1313   BUN 10 12/19/2020 1122   CREATININE 0.80 01/10/2021 1313   CALCIUM 9.6 01/06/2021 0937   GFRNONAA >60 01/06/2021 0937   GFRAA >90 10/21/2012 1409     A/P:  Stable postop course. Continue current plans

## 2021-01-10 NOTE — Op Note (Signed)
[address]       [place] [address]             [phone]                                          01/10/2021 Patient:  Eugene Mccullough Pre-Op Dx: Jerl Santos CAD   HTN   HLP   Obesity     Post-op Dx:  Same Procedure: CABG X 4.  LIMA-LAD, Left radial artery - OM2 (proximal of diagonal vein graft), RSVG D1, and D2.  Open left radial harvest Endoscopic greater saphenous vein harvest on the right   Surgeon and Role:      * Misako Roeder, Lucile Crater, MD - Primary    * M Roddenberry, PA-C - assisting Anesthesia  general EBL:  500 ml Blood Administration: none Xclamp Time:  66 min Pump Time:  181min  Drains: 62 F blake drain: R, L, mediastinal  Wires: none Counts: correct   Indications: 60 year old gentleman with two-vessel coronary artery disease.  We discussed the importance of undergoing coronary bypass grafting.  I reviewed the images with him and his wife again.  He has very small targets in the circumflex and LAD distribution.  He would like [place] to use the left radial artery as one of the conduits.  We will also need a small section of vein graft to bypass the diagonal vessel.   Findings: Good vein and radial conduit.  Good LAD.  Calcified LAD target.  Good diagonals, and marginal targets.    Operative Technique: All invasive lines were placed in pre-op holding.  After the risks, benefits and alternatives were thoroughly discussed, the patient was brought to the operative theatre.  Anesthesia was induced, and the patient was prepped and draped in normal sterile fashion.  An appropriate surgical pause was performed, and pre-operative antibiotics were dosed accordingly.  We began with simultaneous incisions along the left arm for harvesting of the radial artery and the chest for the sternotomy.  Once the radial harvest was completed, we then harvested the right greater saphenous vein endoscopically.  In regards to the sternotomy, this was carried down with bovie  cautery, and the sternum was divided with a reciprocating saw.  Meticulous hemostasis was obtained.  The left internal thoracic artery was exposed and harvested in in pedicled fashion.  The patient was systemically heparinized, and the artery was divided distally, and placed in a papaverine sponge.    The sternal elevator was removed, and a retractor was placed.  The pericardium was divided in the midline and fashioned into a cradle with pericardial stitches.   After we confirmed an appropriate ACT, the ascending aorta was cannulated in standard fashion.  The right atrial appendage was used for venous cannulation site.  Cardiopulmonary bypass was initiated, and the heart retractor was placed. The cross clamp was applied, and a dose of anterograde cardioplegia was given with good arrest of the heart.   Next we exposed the lateral wall, and found a good target on the OM.  An end to side anastomosis with the vein graft was then created.  Next, we exposed the anterior wall of the heart and identified a good target on 1st diagonal.   An arteriotomy was created.  The vein was anastomosed in an end to side fashion.  When then created an anastomosis with the  second diagonal with vein graft as well.  Finally, we exposed a good target on the LAD, and fashioned an end to side anastomosis between it and the LITA.  We began to re-warm, and a re-animation dose of cardioplegia was given.  The heart was de-aired, and the cross clamp was removed.  Meticulous hemostasis was obtained.    A partial occludding clamp was then placed on the ascending aorta, and we created an end to side anastomosis between it and the proximal vein grafts.  The proximal sites were marked with rings.  The proximal radial artery was then jumped off the hood of the 1st diagonal vein graft.  Hemostasis was obtained, and we separated from cardiopulmonary bypass without event.the heparin was reversed with protamine.  Chest tubes and wires were placed, and  the sternum was re-approximated with with sternal wires.  The soft tissue and skin were re-approximated wth absorbable suture.    The patient tolerated the procedure without any immediate complications, and was transferred to the ICU in guarded condition.  Hafiz Irion Bary Leriche

## 2021-01-10 NOTE — Interval H&P Note (Signed)
History and Physical Interval Note:  01/10/2021 7:50 AM  Eugene Mccullough  has presented today for surgery, with the diagnosis of CORONARY ARTERY DISEASE.  The various methods of treatment have been discussed with the patient and family. After consideration of risks, benefits and other options for treatment, the patient has consented to  Procedure(s): CORONARY ARTERY BYPASS GRAFTING (CABG) X    ON PUMP USING (N/A) RADIAL ARTERY HARVEST (Left) TRANSESOPHAGEAL ECHOCARDIOGRAM (TEE) (N/A) as a surgical intervention.  The patient's history has been reviewed, patient examined, no change in status, stable for surgery.  I have reviewed the patient's chart and labs.  Questions were answered to the patient's satisfaction.     [name] Bary Leriche

## 2021-01-10 NOTE — Anesthesia Procedure Notes (Signed)
Arterial Line Insertion Start/End5/17/2022 7:20 AM, 01/10/2021 7:26 AM Performed by: Albertha Ghee, MD, anesthesiologist  Preanesthetic checklist: patient identified, IV checked, site marked, risks and benefits discussed, surgical consent, monitors and equipment checked, pre-op evaluation, timeout performed and anesthesia consent Lidocaine 1% used for infiltration Right, radial was placed Catheter size: 20 G Hand hygiene performed  and maximum sterile barriers used   Attempts: 2 Procedure performed using ultrasound guided technique. Following insertion, dressing applied and Biopatch. Post procedure assessment: normal  Patient tolerated the procedure well with no immediate complications.

## 2021-01-10 NOTE — Brief Op Note (Signed)
01/10/2021  12:01 PM  PATIENT:  Eugene Mccullough  60 y.o. male  PRE-OPERATIVE DIAGNOSIS:  CORONARY ARTERY DISEASE  POST-OPERATIVE DIAGNOSIS:  CORONARY ARTERY DISEASE  PROCEDURE:   CORONARY ARTERY BYPASS GRAFTING (CABG) X  FOUR ON PUMP USING LEFT INTERNAL MAMMARY ARTERY, LEFT RADIAL ARTERY, RIGHT ENDOSCOPIC GREATER SAPHENOUS VEIN HARVEST CONDUITS    LIMA->LAD  LRA->OM  SVG->D1  SVG->D2  Vein harvest time 49min.  Vein prep time 60min  RADIAL ARTERY HARVEST (Left)  TRANSESOPHAGEAL ECHOCARDIOGRAM   SURGEON:  Lightfoot, Lucile Crater, MD - Primary  PHYSICIAN ASSISTANT: Vanessa KickAlcide Goodness, RNFA  ANESTHESIA:   general  EBL:  Per anesthesia and perfusion records  BLOOD ADMINISTERED:none  DRAINS: Bilateral pleural and mediastinal tubes   LOCAL MEDICATIONS USED:  NONE  SPECIMEN:  No Specimen  DISPOSITION OF SPECIMEN:  N/A  COUNTS:  YES  DICTATION: .Dragon Dictation  PLAN OF CARE: Admit to inpatient   PATIENT DISPOSITION:  ICU - intubated and hemodynamically stable.   Delay start of Pharmacological VTE agent (>24hrs) due to surgical blood loss or risk of bleeding: yes

## 2021-01-10 NOTE — Progress Notes (Signed)
RT attempted to wean patient on CPAP 5, PS 10.  Patient had low MVe and RR.  Patient placed back on SIMV/PS/PRVC VT 520, RR 12, 50%, PS 10, +5.

## 2021-01-10 NOTE — Transfer of Care (Signed)
Immediate Anesthesia Transfer of Care Note  Patient: [employer]  Procedure(s) Performed: CORONARY ARTERY BYPASS GRAFTING (CABG) X  FOUR ON PUMP USING LEFT INTERNAL MAMMARY ARTERY, LEFT RADIAL ARTERY, RIGHT ENDOSCOPIC GREATER SAPHENOUS VEIN HARVEST CONDUITS (N/A Chest) RADIAL ARTERY HARVEST (Left Arm Lower) TRANSESOPHAGEAL ECHOCARDIOGRAM (TEE) (N/A )  Patient Location: ICU  Anesthesia Type:General  Level of Consciousness: sedated and unresponsive  Airway & Oxygen Therapy: Patient remains intubated per anesthesia plan and Patient placed on Ventilator (see vital sign flow sheet for setting)  Post-op Assessment: Report given to RN and Post -op Vital signs reviewed and stable  Post vital signs: Reviewed and stable  Last Vitals:  Vitals Value Taken Time  BP    Temp    Pulse 103 01/10/21 1414  Resp 0 01/10/21 1412  SpO2 100 % 01/10/21 1414  Vitals shown include unvalidated device data.  Last Pain:  Vitals:   01/10/21 0627  TempSrc:   PainSc: 0-No pain         Complications: No complications documented.

## 2021-01-10 NOTE — Anesthesia Procedure Notes (Signed)
Central Venous Catheter Insertion Performed by: Albertha Ghee, MD, anesthesiologist Start/End5/17/2022 6:55 AM, 01/10/2021 7:10 AM Patient location: Pre-op. Preanesthetic checklist: patient identified, IV checked, site marked, risks and benefits discussed, surgical consent, monitors and equipment checked, pre-op evaluation, timeout performed and anesthesia consent Lidocaine 1% used for infiltration and patient sedated Hand hygiene performed  and maximum sterile barriers used  Catheter size: 9 Fr MAC introducer Procedure performed using ultrasound guided technique. Ultrasound Notes:anatomy identified, needle tip was noted to be adjacent to the nerve/plexus identified, no ultrasound evidence of intravascular and/or intraneural injection and image(s) printed for medical record Attempts: 1 Following insertion, line sutured and dressing applied. Post procedure assessment: blood return through all ports, free fluid flow and no air  Patient tolerated the procedure well with no immediate complications.

## 2021-01-11 ENCOUNTER — Encounter (HOSPITAL_COMMUNITY): Payer: Self-pay | Admitting: Thoracic Surgery (Cardiothoracic Vascular Surgery)

## 2021-01-11 ENCOUNTER — Encounter (HOSPITAL_COMMUNITY)
Admission: RE | Disposition: A | Discharge status: Home / Self Care | Payer: Self-pay | Source: Home / Self Care | Attending: Thoracic Surgery (Cardiothoracic Vascular Surgery)

## 2021-01-11 ENCOUNTER — Inpatient Hospital Stay (HOSPITAL_COMMUNITY): Payer: 59

## 2021-01-11 ENCOUNTER — Inpatient Hospital Stay (HOSPITAL_COMMUNITY): Payer: 59 | Admitting: Registered Nurse

## 2021-01-11 DIAGNOSIS — I9789 Other postprocedural complications and disorders of the circulatory system, not elsewhere classified: Secondary | ICD-10-CM

## 2021-01-11 HISTORY — PX: EXPLORATION POST OPERATIVE OPEN HEART: SHX5061

## 2021-01-11 LAB — CBC
HCT: 20.5 % — ABNORMAL LOW (ref 39.0–52.0)
HCT: 19.9 % — ABNORMAL LOW (ref 39.0–52.0)
HCT: 28.2 % — ABNORMAL LOW (ref 39.0–52.0)
HCT: 29.7 % — ABNORMAL LOW (ref 39.0–52.0)
Hemoglobin: 7.1 g/dL — ABNORMAL LOW (ref 13.0–17.0)
Hemoglobin: 6.9 g/dL — CL (ref 13.0–17.0)
Hemoglobin: 9.8 g/dL — ABNORMAL LOW (ref 13.0–17.0)
Hemoglobin: 10.2 g/dL — ABNORMAL LOW (ref 13.0–17.0)
MCH: 28.6 pg (ref 26.0–34.0)
MCH: 29 pg (ref 26.0–34.0)
MCH: 29.3 pg (ref 26.0–34.0)
MCH: 28.8 pg (ref 26.0–34.0)
MCHC: 34.6 g/dL (ref 30.0–36.0)
MCHC: 34.7 g/dL (ref 30.0–36.0)
MCHC: 34.8 g/dL (ref 30.0–36.0)
MCHC: 34.3 g/dL (ref 30.0–36.0)
MCV: 82.7 fL (ref 80.0–100.0)
MCV: 83.6 fL (ref 80.0–100.0)
MCV: 84.4 fL (ref 80.0–100.0)
MCV: 83.9 fL (ref 80.0–100.0)
Platelets: 110 10*3/uL — ABNORMAL LOW (ref 150–400)
Platelets: 110 10*3/uL — ABNORMAL LOW (ref 150–400)
Platelets: 79 10*3/uL — ABNORMAL LOW (ref 150–400)
Platelets: 90 10*3/uL — ABNORMAL LOW (ref 150–400)
RBC: 2.48 MIL/uL — ABNORMAL LOW (ref 4.22–5.81)
RBC: 2.38 MIL/uL — ABNORMAL LOW (ref 4.22–5.81)
RBC: 3.34 MIL/uL — ABNORMAL LOW (ref 4.22–5.81)
RBC: 3.54 MIL/uL — ABNORMAL LOW (ref 4.22–5.81)
RDW: 14.1 % (ref 11.5–15.5)
RDW: 14.4 % (ref 11.5–15.5)
RDW: 15.1 % (ref 11.5–15.5)
RDW: 15.3 % (ref 11.5–15.5)
WBC: 6 10*3/uL (ref 4.0–10.5)
WBC: 6.3 10*3/uL (ref 4.0–10.5)
WBC: 5.8 10*3/uL (ref 4.0–10.5)
WBC: 7.5 10*3/uL (ref 4.0–10.5)
nRBC: 0 % (ref 0.0–0.2)
nRBC: 0 % (ref 0.0–0.2)
nRBC: 0 % (ref 0.0–0.2)
nRBC: 0 % (ref 0.0–0.2)

## 2021-01-11 LAB — POCT I-STAT 7, (LYTES, BLD GAS, ICA,H+H)
Acid-base deficit: 2 mmol/L (ref 0.0–2.0)
Acid-base deficit: 3 mmol/L — ABNORMAL HIGH (ref 0.0–2.0)
Acid-base deficit: 4 mmol/L — ABNORMAL HIGH (ref 0.0–2.0)
Acid-base deficit: 3 mmol/L — ABNORMAL HIGH (ref 0.0–2.0)
Acid-base deficit: 4 mmol/L — ABNORMAL HIGH (ref 0.0–2.0)
Acid-base deficit: 2 mmol/L (ref 0.0–2.0)
Acid-base deficit: 3 mmol/L — ABNORMAL HIGH (ref 0.0–2.0)
Bicarbonate: 21.8 mmol/L (ref 20.0–28.0)
Bicarbonate: 21.6 mmol/L (ref 20.0–28.0)
Bicarbonate: 22.6 mmol/L (ref 20.0–28.0)
Bicarbonate: 20.2 mmol/L (ref 20.0–28.0)
Bicarbonate: 21.2 mmol/L (ref 20.0–28.0)
Bicarbonate: 22.9 mmol/L (ref 20.0–28.0)
Bicarbonate: 21.2 mmol/L (ref 20.0–28.0)
Calcium, Ion: 1.19 mmol/L (ref 1.15–1.40)
Calcium, Ion: 1.2 mmol/L (ref 1.15–1.40)
Calcium, Ion: 1.29 mmol/L (ref 1.15–1.40)
Calcium, Ion: 1.11 mmol/L — ABNORMAL LOW (ref 1.15–1.40)
Calcium, Ion: 1.18 mmol/L (ref 1.15–1.40)
Calcium, Ion: 1.29 mmol/L (ref 1.15–1.40)
Calcium, Ion: 1.25 mmol/L (ref 1.15–1.40)
HCT: 22 % — ABNORMAL LOW (ref 39.0–52.0)
HCT: 20 % — ABNORMAL LOW (ref 39.0–52.0)
HCT: 19 % — ABNORMAL LOW (ref 39.0–52.0)
HCT: 19 % — ABNORMAL LOW (ref 39.0–52.0)
HCT: 25 % — ABNORMAL LOW (ref 39.0–52.0)
HCT: 27 % — ABNORMAL LOW (ref 39.0–52.0)
HCT: 25 % — ABNORMAL LOW (ref 39.0–52.0)
Hemoglobin: 7.5 g/dL — ABNORMAL LOW (ref 13.0–17.0)
Hemoglobin: 6.8 g/dL — CL (ref 13.0–17.0)
Hemoglobin: 6.5 g/dL — CL (ref 13.0–17.0)
Hemoglobin: 6.5 g/dL — CL (ref 13.0–17.0)
Hemoglobin: 8.5 g/dL — ABNORMAL LOW (ref 13.0–17.0)
Hemoglobin: 9.2 g/dL — ABNORMAL LOW (ref 13.0–17.0)
Hemoglobin: 8.5 g/dL — ABNORMAL LOW (ref 13.0–17.0)
O2 Saturation: 99 %
O2 Saturation: 99 %
O2 Saturation: 100 %
O2 Saturation: 100 %
O2 Saturation: 94 %
O2 Saturation: 90 %
O2 Saturation: 90 %
Patient temperature: 37.5
Patient temperature: 37.6
Patient temperature: 36
Patient temperature: 36.9
Patient temperature: 37
Potassium: 4 mmol/L (ref 3.5–5.1)
Potassium: 3.9 mmol/L (ref 3.5–5.1)
Potassium: 4 mmol/L (ref 3.5–5.1)
Potassium: 3.8 mmol/L (ref 3.5–5.1)
Potassium: 4 mmol/L (ref 3.5–5.1)
Potassium: 3.9 mmol/L (ref 3.5–5.1)
Potassium: 3.6 mmol/L (ref 3.5–5.1)
Sodium: 143 mmol/L (ref 135–145)
Sodium: 143 mmol/L (ref 135–145)
Sodium: 144 mmol/L (ref 135–145)
Sodium: 142 mmol/L (ref 135–145)
Sodium: 143 mmol/L (ref 135–145)
Sodium: 144 mmol/L (ref 135–145)
Sodium: 144 mmol/L (ref 135–145)
TCO2: 23 mmol/L (ref 22–32)
TCO2: 23 mmol/L (ref 22–32)
TCO2: 24 mmol/L (ref 22–32)
TCO2: 21 mmol/L — ABNORMAL LOW (ref 22–32)
TCO2: 22 mmol/L (ref 22–32)
TCO2: 24 mmol/L (ref 22–32)
TCO2: 22 mmol/L (ref 22–32)
pCO2 arterial: 34.2 mmHg (ref 32.0–48.0)
pCO2 arterial: 36.1 mmHg (ref 32.0–48.0)
pCO2 arterial: 47.2 mmHg (ref 32.0–48.0)
pCO2 arterial: 29.6 mmHg — ABNORMAL LOW (ref 32.0–48.0)
pCO2 arterial: 31.3 mmHg — ABNORMAL LOW (ref 32.0–48.0)
pCO2 arterial: 40.6 mmHg (ref 32.0–48.0)
pCO2 arterial: 35 mmHg (ref 32.0–48.0)
pH, Arterial: 7.415 (ref 7.350–7.450)
pH, Arterial: 7.387 (ref 7.350–7.450)
pH, Arterial: 7.289 — ABNORMAL LOW (ref 7.350–7.450)
pH, Arterial: 7.418 (ref 7.350–7.450)
pH, Arterial: 7.46 — ABNORMAL HIGH (ref 7.350–7.450)
pH, Arterial: 7.358 (ref 7.350–7.450)
pH, Arterial: 7.39 (ref 7.350–7.450)
pO2, Arterial: 158 mmHg — ABNORMAL HIGH (ref 83.0–108.0)
pO2, Arterial: 163 mmHg — ABNORMAL HIGH (ref 83.0–108.0)
pO2, Arterial: 334 mmHg — ABNORMAL HIGH (ref 83.0–108.0)
pO2, Arterial: 158 mmHg — ABNORMAL HIGH (ref 83.0–108.0)
pO2, Arterial: 68 mmHg — ABNORMAL LOW (ref 83.0–108.0)
pO2, Arterial: 61 mmHg — ABNORMAL LOW (ref 83.0–108.0)
pO2, Arterial: 59 mmHg — ABNORMAL LOW (ref 83.0–108.0)

## 2021-01-11 LAB — POCT I-STAT, CHEM 8
BUN: 8 mg/dL (ref 6–20)
Calcium, Ion: 1.19 mmol/L (ref 1.15–1.40)
Chloride: 107 mmol/L (ref 98–111)
Creatinine, Ser: 0.8 mg/dL (ref 0.61–1.24)
Glucose, Bld: 142 mg/dL — ABNORMAL HIGH (ref 70–99)
HCT: 18 % — ABNORMAL LOW (ref 39.0–52.0)
Hemoglobin: 6.1 g/dL — CL (ref 13.0–17.0)
Potassium: 4 mmol/L (ref 3.5–5.1)
Sodium: 140 mmol/L (ref 135–145)
TCO2: 22 mmol/L (ref 22–32)

## 2021-01-11 LAB — BPAM PLATELET PHERESIS
Blood Product Expiration Date: 202205192359
Blood Product Expiration Date: 202205192359
ISSUE DATE / TIME: 202205171744
ISSUE DATE / TIME: 202205180553
Unit Type and Rh: 5100
Unit Type and Rh: 6200

## 2021-01-11 LAB — PREPARE PLATELET PHERESIS
Unit division: 0
Unit division: 0

## 2021-01-11 LAB — BASIC METABOLIC PANEL
Anion gap: 5 (ref 5–15)
Anion gap: 5 (ref 5–15)
BUN: 10 mg/dL (ref 6–20)
BUN: 7 mg/dL (ref 6–20)
CO2: 23 mmol/L (ref 22–32)
CO2: 23 mmol/L (ref 22–32)
Calcium: 8.3 mg/dL — ABNORMAL LOW (ref 8.9–10.3)
Calcium: 8.6 mg/dL — ABNORMAL LOW (ref 8.9–10.3)
Chloride: 110 mmol/L (ref 98–111)
Chloride: 111 mmol/L (ref 98–111)
Creatinine, Ser: 1.09 mg/dL (ref 0.61–1.24)
Creatinine, Ser: 0.86 mg/dL (ref 0.61–1.24)
GFR, Estimated: >60 mL/min (ref >60)
GFR, Estimated: >60 mL/min (ref >60)
Glucose, Bld: 144 mg/dL — ABNORMAL HIGH (ref 70–99)
Glucose, Bld: 103 mg/dL — ABNORMAL HIGH (ref 70–99)
Potassium: 4 mmol/L (ref 3.5–5.1)
Potassium: 4.2 mmol/L (ref 3.5–5.1)
Sodium: 138 mmol/L (ref 135–145)
Sodium: 139 mmol/L (ref 135–145)

## 2021-01-11 LAB — BPAM FFP
Blood Product Expiration Date: 202205172359
Blood Product Expiration Date: 202205172359
ISSUE DATE / TIME: 202205171744
ISSUE DATE / TIME: 202205172249
Unit Type and Rh: 6200
Unit Type and Rh: 6200

## 2021-01-11 LAB — FIBRINOGEN
Fibrinogen: 225 mg/dL (ref 210–475)
Fibrinogen: 245 mg/dL (ref 210–475)

## 2021-01-11 LAB — BPAM CRYOPRECIPITATE
Blood Product Expiration Date: 202205180413
Blood Product Expiration Date: 202205181147
ISSUE DATE / TIME: 202205172249
ISSUE DATE / TIME: 202205180608
Unit Type and Rh: 5100
Unit Type and Rh: 5100

## 2021-01-11 LAB — PREPARE FRESH FROZEN PLASMA: Unit division: 0

## 2021-01-11 LAB — GLUCOSE, CAPILLARY
Glucose-Capillary: 105 mg/dL — ABNORMAL HIGH (ref 70–99)
Glucose-Capillary: 110 mg/dL — ABNORMAL HIGH (ref 70–99)
Glucose-Capillary: 111 mg/dL — ABNORMAL HIGH (ref 70–99)
Glucose-Capillary: 137 mg/dL — ABNORMAL HIGH (ref 70–99)
Glucose-Capillary: 138 mg/dL — ABNORMAL HIGH (ref 70–99)
Glucose-Capillary: 138 mg/dL — ABNORMAL HIGH (ref 70–99)
Glucose-Capillary: 138 mg/dL — ABNORMAL HIGH (ref 70–99)
Glucose-Capillary: 144 mg/dL — ABNORMAL HIGH (ref 70–99)
Glucose-Capillary: 148 mg/dL — ABNORMAL HIGH (ref 70–99)
Glucose-Capillary: 148 mg/dL — ABNORMAL HIGH (ref 70–99)
Glucose-Capillary: 91 mg/dL (ref 70–99)
Glucose-Capillary: 95 mg/dL (ref 70–99)
Glucose-Capillary: 99 mg/dL (ref 70–99)

## 2021-01-11 LAB — PREPARE CRYOPRECIPITATE
Unit division: 0
Unit division: 0

## 2021-01-11 LAB — PREPARE RBC (CROSSMATCH)

## 2021-01-11 LAB — PROTIME-INR
INR: 1.4 — ABNORMAL HIGH (ref 0.8–1.2)
INR: 1.5 — ABNORMAL HIGH (ref 0.8–1.2)
INR: 1.5 — ABNORMAL HIGH (ref 0.8–1.2)
Prothrombin Time: 17.5 seconds — ABNORMAL HIGH (ref 11.4–15.2)
Prothrombin Time: 17.6 seconds — ABNORMAL HIGH (ref 11.4–15.2)
Prothrombin Time: 17.8 seconds — ABNORMAL HIGH (ref 11.4–15.2)

## 2021-01-11 LAB — MAGNESIUM
Magnesium: 2.2 mg/dL (ref 1.7–2.4)
Magnesium: 1.8 mg/dL (ref 1.7–2.4)

## 2021-01-11 SURGERY — EXPLORATION POST OPERATIVE OPEN HEART
Anesthesia: General | Site: Chest

## 2021-01-11 MED ORDER — SODIUM CHLORIDE 0.9 % IV SOLN: INTRAVENOUS | Status: DC | PRN

## 2021-01-11 MED ORDER — ORAL CARE MOUTH RINSE
15.0000 mL | Freq: Two times a day (BID) | OROMUCOSAL | Status: DC
  Administered 2021-01-11 – 2021-01-15 (×8): 15 mL via OROMUCOSAL

## 2021-01-11 MED ORDER — MIDAZOLAM HCL 2 MG/2ML IJ SOLN
INTRAMUSCULAR | Status: DC | PRN
  Administered 2021-01-11: 2 mg via INTRAVENOUS

## 2021-01-11 MED ORDER — FENTANYL CITRATE (PF) 250 MCG/5ML IJ SOLN
INTRAMUSCULAR | Status: DC | PRN
  Administered 2021-01-11 (×7): 50 ug via INTRAVENOUS
  Administered 2021-01-11: 150 ug via INTRAVENOUS

## 2021-01-11 MED ORDER — CALCIUM CHLORIDE 10 % IV SOLN
INTRAVENOUS | Status: DC | PRN
  Administered 2021-01-11: 300 mg via INTRAVENOUS
  Administered 2021-01-11: 200 mg via INTRAVENOUS

## 2021-01-11 MED ORDER — EPHEDRINE 5 MG/ML INJ
INTRAVENOUS | Status: AC
  Filled 2021-01-11: qty 10

## 2021-01-11 MED ORDER — ENOXAPARIN SODIUM 40 MG/0.4ML IJ SOSY
40.0000 mg | PREFILLED_SYRINGE | Freq: Every day | INTRAMUSCULAR | Status: DC
  Administered 2021-01-12 – 2021-01-15 (×4): 40 mg via SUBCUTANEOUS
  Filled 2021-01-11 (×4): qty 0.4

## 2021-01-11 MED ORDER — ETOMIDATE 2 MG/ML IV SOLN
INTRAVENOUS | Status: DC | PRN
  Administered 2021-01-11: 14 mg via INTRAVENOUS

## 2021-01-11 MED ORDER — POTASSIUM CHLORIDE 10 MEQ/50ML IV SOLN
10.0000 meq | INTRAVENOUS | Status: AC
  Administered 2021-01-11 (×3): 10 meq via INTRAVENOUS

## 2021-01-11 MED ORDER — LACTATED RINGERS IV SOLN: INTRAVENOUS | Status: DC | PRN

## 2021-01-11 MED ORDER — EPHEDRINE SULFATE-NACL 50-0.9 MG/10ML-% IV SOSY
PREFILLED_SYRINGE | INTRAVENOUS | Status: DC | PRN
  Administered 2021-01-11: 10 mg via INTRAVENOUS

## 2021-01-11 MED ORDER — PHENYLEPHRINE 40 MCG/ML (10ML) SYRINGE FOR IV PUSH (FOR BLOOD PRESSURE SUPPORT)
PREFILLED_SYRINGE | INTRAVENOUS | Status: AC
  Filled 2021-01-11: qty 10

## 2021-01-11 MED ORDER — SUCCINYLCHOLINE CHLORIDE 200 MG/10ML IV SOSY
PREFILLED_SYRINGE | INTRAVENOUS | Status: AC
  Filled 2021-01-11: qty 10

## 2021-01-11 MED ORDER — AMLODIPINE BESYLATE 5 MG PO TABS: 2.5000 mg | ORAL_TABLET | Freq: Every day | ORAL | Status: DC

## 2021-01-11 MED ORDER — FENTANYL CITRATE (PF) 250 MCG/5ML IJ SOLN
INTRAMUSCULAR | Status: AC
  Filled 2021-01-11: qty 5

## 2021-01-11 MED ORDER — ALUM & MAG HYDROXIDE-SIMETH 200-200-20 MG/5ML PO SUSP
15.0000 mL | Freq: Three times a day (TID) | ORAL | Status: DC | PRN
  Administered 2021-01-13: 15 mL via ORAL
  Filled 2021-01-11: qty 30

## 2021-01-11 MED ORDER — PANTOPRAZOLE SODIUM 40 MG PO TBEC
40.0000 mg | DELAYED_RELEASE_TABLET | Freq: Two times a day (BID) | ORAL | Status: DC
  Administered 2021-01-11 – 2021-01-15 (×9): 40 mg via ORAL
  Filled 2021-01-11 (×9): qty 1

## 2021-01-11 MED ORDER — ROCURONIUM BROMIDE 10 MG/ML (PF) SYRINGE
PREFILLED_SYRINGE | INTRAVENOUS | Status: AC
  Filled 2021-01-11: qty 10

## 2021-01-11 MED ORDER — LIDOCAINE 2% (20 MG/ML) 5 ML SYRINGE
INTRAMUSCULAR | Status: DC | PRN
  Administered 2021-01-11: 100 mg via INTRAVENOUS

## 2021-01-11 MED ORDER — INSULIN ASPART 100 UNIT/ML IJ SOLN
0.0000 [IU] | INTRAMUSCULAR | Status: DC
  Administered 2021-01-12 (×2): 2 [IU] via SUBCUTANEOUS

## 2021-01-11 MED ORDER — PHENYLEPHRINE 40 MCG/ML (10ML) SYRINGE FOR IV PUSH (FOR BLOOD PRESSURE SUPPORT)
PREFILLED_SYRINGE | INTRAVENOUS | Status: DC | PRN
  Administered 2021-01-11 (×2): 120 ug via INTRAVENOUS
  Administered 2021-01-11 (×2): 80 ug via INTRAVENOUS

## 2021-01-11 MED ORDER — SODIUM CHLORIDE 0.9 % IV SOLN: 10.0000 mL/h | Freq: Once | INTRAVENOUS | Status: DC

## 2021-01-11 MED ORDER — SUCCINYLCHOLINE CHLORIDE 200 MG/10ML IV SOSY
PREFILLED_SYRINGE | INTRAVENOUS | Status: DC | PRN
  Administered 2021-01-11: 140 mg via INTRAVENOUS

## 2021-01-11 MED ORDER — 0.9 % SODIUM CHLORIDE (POUR BTL) OPTIME
TOPICAL | Status: DC | PRN
  Administered 2021-01-11: 1000 mL

## 2021-01-11 MED ORDER — LIDOCAINE 2% (20 MG/ML) 5 ML SYRINGE
INTRAMUSCULAR | Status: AC
  Filled 2021-01-11: qty 5

## 2021-01-11 MED ORDER — ALBUMIN HUMAN 5 % IV SOLN: INTRAVENOUS | Status: DC | PRN

## 2021-01-11 MED ORDER — HEMOSTATIC AGENTS (NO CHARGE) OPTIME
TOPICAL | Status: DC | PRN
  Administered 2021-01-11: 1 via TOPICAL

## 2021-01-11 MED ORDER — STERILE WATER FOR IRRIGATION IR SOLN
Status: DC | PRN
  Administered 2021-01-11 (×2): 1000 mL

## 2021-01-11 MED ORDER — MIDAZOLAM HCL 2 MG/2ML IJ SOLN
INTRAMUSCULAR | Status: AC
  Filled 2021-01-11: qty 2

## 2021-01-11 MED ORDER — ROCURONIUM BROMIDE 10 MG/ML (PF) SYRINGE
PREFILLED_SYRINGE | INTRAVENOUS | Status: DC | PRN
  Administered 2021-01-11: 40 mg via INTRAVENOUS
  Administered 2021-01-11: 60 mg via INTRAVENOUS

## 2021-01-11 MED ORDER — PROPOFOL 10 MG/ML IV BOLUS
INTRAVENOUS | Status: DC | PRN
  Administered 2021-01-11: 40 mg via INTRAVENOUS

## 2021-01-11 SURGICAL SUPPLY — 69 items
BAG DECANTER FOR FLEXI CONT (MISCELLANEOUS) IMPLANT
BNDG GAUZE ELAST 4 BULKY (GAUZE/BANDAGES/DRESSINGS) IMPLANT
CANISTER SUCT 3000ML PPV (MISCELLANEOUS) ×2 IMPLANT
CANNULA MC2 2 STG 29/37 NON-V (CANNULA) IMPLANT
CANNULA MC2 TWO STAGE (CANNULA)
CANNULA NON VENT 20FR 12 (CANNULA) IMPLANT
CATH CPB KIT HENDRICKSON (MISCELLANEOUS) IMPLANT
CLIP VESOCCLUDE MED 24/CT (CLIP) IMPLANT
CLIP VESOCCLUDE MED 6/CT (CLIP) ×2 IMPLANT
CLIP VESOCCLUDE SM WIDE 24/CT (CLIP) IMPLANT
CLIP VESOCCLUDE SM WIDE 6/CT (CLIP) ×2 IMPLANT
CONN ST 1/2X1/2  BEN (MISCELLANEOUS)
CONN ST 1/2X1/2 BEN (MISCELLANEOUS) IMPLANT
CONNECTOR BLAKE 2:1 CARIO BLK (MISCELLANEOUS) IMPLANT
DRAIN CHANNEL 19F RND (DRAIN) IMPLANT
DRAPE CARDIOVASCULAR INCISE (DRAPES)
DRAPE INCISE IOBAN 66X45 STRL (DRAPES) IMPLANT
DRAPE SRG 135X102X78XABS (DRAPES) IMPLANT
DRAPE WARM FLUID 44X44 (DRAPES) ×2 IMPLANT
DRSG AQUACEL AG ADV 3.5X14 (GAUZE/BANDAGES/DRESSINGS) ×2 IMPLANT
DRSG COVADERM 4X14 (GAUZE/BANDAGES/DRESSINGS) IMPLANT
ELECT REM PT RETURN 9FT ADLT (ELECTROSURGICAL)
ELECTRODE REM PT RTRN 9FT ADLT (ELECTROSURGICAL) IMPLANT
FELT TEFLON 1X6 (MISCELLANEOUS) IMPLANT
GAUZE SPONGE 4X4 12PLY STRL (GAUZE/BANDAGES/DRESSINGS) ×2 IMPLANT
GLOVE BIO SURGEON STRL SZ7 (GLOVE) IMPLANT
GLOVE BIOGEL PI IND STRL 7.5 (GLOVE) ×2 IMPLANT
GLOVE BIOGEL PI INDICATOR 7.5 (GLOVE) ×2
GLOVE SURG MICRO LTX SZ7.5 (GLOVE) ×2 IMPLANT
GOWN STRL REUS W/ TWL LRG LVL3 (GOWN DISPOSABLE) ×3 IMPLANT
GOWN STRL REUS W/ TWL XL LVL3 (GOWN DISPOSABLE) IMPLANT
GOWN STRL REUS W/TWL LRG LVL3 (GOWN DISPOSABLE) ×6
GOWN STRL REUS W/TWL XL LVL3 (GOWN DISPOSABLE)
HEMOSTAT POWDER SURGIFOAM 1G (HEMOSTASIS) IMPLANT
HEMOSTAT SURGICEL 2X14 (HEMOSTASIS) ×2 IMPLANT
INSERT SUTURE HOLDER (MISCELLANEOUS) ×2 IMPLANT
KIT BASIN OR (CUSTOM PROCEDURE TRAY) ×2 IMPLANT
KIT SUCTION CATH 14FR (SUCTIONS) IMPLANT
KIT TURNOVER KIT B (KITS) IMPLANT
LEAD PACING MYOCARDI (MISCELLANEOUS) IMPLANT
NS IRRIG 1000ML POUR BTL (IV SOLUTION) ×6 IMPLANT
PACK E OPEN HEART (SUTURE) IMPLANT
PACK OPEN HEART (CUSTOM PROCEDURE TRAY) IMPLANT
PAD ARMBOARD 7.5X6 YLW CONV (MISCELLANEOUS) ×4 IMPLANT
PAD ELECT DEFIB RADIOL ZOLL (MISCELLANEOUS) IMPLANT
POSITIONER HEAD DONUT 9IN (MISCELLANEOUS) ×2 IMPLANT
SUT BONE WAX W31G (SUTURE) IMPLANT
SUT MNCRL AB 3-0 PS2 18 (SUTURE) ×4 IMPLANT
SUT PDS AB 1 CTX 36 (SUTURE) ×4 IMPLANT
SUT PROLENE 2 0 SH DA (SUTURE) IMPLANT
SUT PROLENE 3 0 SH DA (SUTURE) IMPLANT
SUT PROLENE 3 0 SH1 36 (SUTURE) IMPLANT
SUT PROLENE 4 0 RB 1 (SUTURE)
SUT PROLENE 4 0 SH DA (SUTURE) IMPLANT
SUT PROLENE 4-0 RB1 .5 CRCL 36 (SUTURE) IMPLANT
SUT PROLENE 5 0 C 1 36 (SUTURE) IMPLANT
SUT PROLENE 6 0 C 1 30 (SUTURE) IMPLANT
SUT PROLENE 8 0 BV175 6 (SUTURE) IMPLANT
SUT PROLENE BLUE 7 0 (SUTURE) IMPLANT
SUT PROLENE POLY MONO (SUTURE) IMPLANT
SUT SILK 2 0 SH CR/8 (SUTURE) ×2 IMPLANT
SUT STEEL 6MS V (SUTURE) IMPLANT
SUT STEEL STERNAL CCS#1 18IN (SUTURE) ×6 IMPLANT
SUT VIC AB 2-0 CTX 36 (SUTURE) ×2 IMPLANT
SYSTEM SAHARA CHEST DRAIN ATS (WOUND CARE) ×2 IMPLANT
TOWEL GREEN STERILE (TOWEL DISPOSABLE) ×2 IMPLANT
TOWEL GREEN STERILE FF (TOWEL DISPOSABLE) ×2 IMPLANT
UNDERPAD 30X36 HEAVY ABSORB (UNDERPADS AND DIAPERS) IMPLANT
WATER STERILE IRR 1000ML POUR (IV SOLUTION) IMPLANT

## 2021-01-11 NOTE — Procedures (Signed)
Extubation Procedure Note  Patient Details:   Name: Eugene Mccullough DOB: [birth year] MRN: [id number]   Airway Documentation:    Vent end date: 01/11/21 Vent end time: 0230   Evaluation  O2 sats: stable throughout Complications: No apparent complications Patient did tolerate procedure well. Bilateral Breath Sounds: Diminished   Yes, pt able to cough to clear secretions and hoarsely vocalize name. Pt placed on 4L nasal cannula and tolerating well at this time. Pt had cuff leak pre extubation with a NIF of -28 and VC of 6L.   Virgilio Frees 01/11/2021, 2:44 AM

## 2021-01-11 NOTE — Progress Notes (Signed)
      [address]       [place] [address]             [phone]                 Day of Surgery Procedure(s) (LRB): , MEDIASTINAL WASHOUT (N/A)   Events: Significant bleeding. Extubated at 2 AM, hemodynamically stable. Since returning from his washout he has been in the extubated and remained stable. _______________________________________________________________ Vitals: BP 104/69   Pulse 91   Temp 99.14 F (37.3 C)   Resp 18   Ht 5\' 7"  (1.702 m)   Wt 88.9 kg   SpO2 95%   BMI 30.70 kg/m   - Neuro: Alert NAD  - Cardiovascular: Sinus  Drips: Cardene at 5.   CVP:  [1 mmHg-8 mmHg] 8 mmHg  - Pulm: Easy work of breathing  ABG    Component Value Date/Time   PHART 7.390 01/11/2021 1041   PCO2ART 35.0 01/11/2021 1041   PO2ART 59 (L) 01/11/2021 1041   HCO3 21.2 01/11/2021 1041   TCO2 22 01/11/2021 1041   ACIDBASEDEF 3.0 (H) 01/11/2021 1041   O2SAT 90.0 01/11/2021 1041    - Abd: Nondistended - Extremity: Warm  .Intake/Output      05/17 0701 05/18 0700 05/18 0701 05/19 0700   I.V. (mL/kg) 4325.2 (48.7) 1403.2 (15.8)   Blood 1168 1458   Other 0    NG/GT 30    IV Piggyback 1190.8 500   Total Intake(mL/kg) 6714 (75.5) 3361.2 (37.8)   Urine (mL/kg/hr) 4470 (2.1) 1010 (1.4)   Emesis/NG output 50    Blood 650 150   Chest Tube 1860 130   Total Output 7030 1290   Net -316 +2071.2           _______________________________________________________________ Labs: CBC Latest Ref Rng & Units 01/11/2021 01/11/2021 01/11/2021  WBC 4.0 - 10.5 K/uL - - 5.8  Hemoglobin 13.0 - 17.0 g/dL 8.5(L) 9.2(L) 9.8(L)  Hematocrit 39.0 - 52.0 % 25.0(L) 27.0(L) 28.2(L)  Platelets 150 - 400 K/uL - - 79(L)   CMP Latest Ref Rng & Units 01/11/2021 01/11/2021 01/11/2021  Glucose 70 - 99 mg/dL - - -  BUN 6 - 20 mg/dL - - -  Creatinine 0.61 - 1.24 mg/dL - - -  Sodium 135 - 145 mmol/L 144 144 143  Potassium 3.5 - 5.1 mmol/L 3.6 3.9 3.8  Chloride 98 - 111 mmol/L - - -   CO2 22 - 32 mmol/L - - -  Calcium 8.9 - 10.3 mg/dL - - -  Total Protein 6.5 - 8.1 g/dL - - -  Total Bilirubin 0.3 - 1.2 mg/dL - - -  Alkaline Phos 38 - 126 U/L - - -  AST 15 - 41 U/L - - -  ALT 0 - 44 U/L - - -    CXR: Pulmonary vascular congestion.  Small left effusion.  _______________________________________________________________  Assessment and Plan: POD 1 s/p CABG 4.  Postop day 0 status post mediastinal washout.  Neuro: Pain controlled. CV: On aspirin statin and beta-blocker.  Will transition to Cardene to Norvasc for radial artery harvest. Pulm: Continue pulmonary toilet Renal: Excellent urine output. GI: We will advance diet today. Heme: Stable ID: Afebrile. Endo: Sliding scale insulin. Dispo: Continue ICU care.   Lajuana Matte 01/11/2021 3:24 PM

## 2021-01-11 NOTE — Anesthesia Preprocedure Evaluation (Signed)
Anesthesia Evaluation  Patient identified by MRN, date of birth, ID band Patient awake  Preop documentation limited or incomplete due to emergent nature of procedure.  Airway Mallampati: II  TM Distance: >3 FB     Dental   Pulmonary sleep apnea , former smoker,    breath sounds clear to auscultation       Cardiovascular hypertension, + angina + CAD   Rhythm:Regular Rate:Normal     Neuro/Psych  Neuromuscular disease    GI/Hepatic PUD, GERD  ,  Endo/Other    Renal/GU      Musculoskeletal  (+) Arthritis ,   Abdominal   Peds  Hematology  (+) anemia ,   Anesthesia Other Findings   Reproductive/Obstetrics                             Anesthesia Physical Anesthesia Plan  ASA: III  Anesthesia Plan: General   Post-op Pain Management:    Induction: Intravenous  PONV Risk Score and Plan: 2 and Ondansetron, Dexamethasone and Midazolam  Airway Management Planned: Oral ETT  Additional Equipment: Arterial line and CVP  Intra-op Plan:   Post-operative Plan: Possible Post-op intubation/ventilation  Informed Consent: I have reviewed the patients History and Physical, chart, labs and discussed the procedure including the risks, benefits and alternatives for the proposed anesthesia with the patient or authorized representative who has indicated his/her understanding and acceptance.     Dental advisory given  Plan Discussed with: CRNA, Anesthesiologist and Surgeon  Anesthesia Plan Comments:         Anesthesia Quick Evaluation

## 2021-01-11 NOTE — Op Note (Signed)
      [address]       [place] [address]             [phone]                                          01/11/2021 Patient:  [name] Pre-Op Dx: Hypovolemic shock   Excessive chest tube output   Status post CABG  Post-op Dx: Same Procedure: Mediastinal re-exploration    Surgeon and Role:      * Bette Brienza, Lucile Crater, MD - Primary  Anesthesia  general EBL: 150 ml Blood Administration: 4 units of packed red blood cells, 1 unit of FFP.   Drains: 35 F blake drain: R, L, mediastinal   Counts: correct   Indications: 60 year old male status post CABG.  He is postoperative day 1.  Over the last 24 hours he has had excessive chest tube output.  He was mildly coagulopathic upon arrival to the ICU.  This was corrected.  He his chest tube output did wax and wane but he recently developed over 400 mL in 1 hour.  He was hemodynamically stable but due to concern for surgical bleeding, he was taken to the operating room for exploration.  Findings: There was a small anterior clot.  Most of the clot was along the lateral wall.  There was no evidence of bleeding from the distal and proximal anastomosis.  All surgical cannulation sites were hemostatic as well.  There was a small venous oozer the left hemisternum.  This was controlled with cautery.  The patient remained hemodynamically stable throughout the entire exploration.  Operative Technique: The patient was brought to the operative theatre.  Anesthesia was induced, and the patient was prepped and draped in normal sterile fashion.  An appropriate surgical pause was performed, and pre-operative antibiotics were dosed accordingly.  The previous sternotomy was re-opened, and the sternal retractor was then placed.  Most of the clot was along the lateral wall.  The mediastinum was irrigated copiously.  There was no active surgical bleeding from the proximal or distal anastomosis.  The cannulation sites were also  hemostatic.  There was a bleeder from the left hemisternum that was cauterized.   Chest tubes were placed, and the sternum was re-approximated with with sternal wires.  The soft tissue and skin were re-approximated wth absorbable suture.    The patient tolerated the procedure without any immediate complications, and was transferred to the ICU in guarded condition.  Herta Hink Bary Leriche

## 2021-01-11 NOTE — Anesthesia Procedure Notes (Addendum)
Procedure Name: Intubation Date/Time: 01/11/2021 6:50 AM Performed by: Jearld Pies, CRNA Pre-anesthesia Checklist: Patient identified, Emergency Drugs available, Suction available and Patient being monitored Patient Re-evaluated:Patient Re-evaluated prior to induction Oxygen Delivery Method: Circle System Utilized Preoxygenation: Pre-oxygenation with 100% oxygen Induction Type: IV induction Ventilation: Mask ventilation without difficulty Laryngoscope Size: Glidescope and 4 Grade View: Grade I Tube type: Oral Tube size: 7.5 mm Number of attempts: 1 Airway Equipment and Method: Stylet and Video-laryngoscopy Placement Confirmation: ETT inserted through vocal cords under direct vision,  positive ETCO2 and breath sounds checked- equal and bilateral Secured at: 22 cm Tube secured with: Tape Dental Injury: Teeth and Oropharynx as per pre-operative assessment  Comments: Elective glidescope due to emergent procedure and significant fluid shifts overnight

## 2021-01-11 NOTE — Procedures (Signed)
Extubation Procedure Note  Patient Details:   Name: Eugene Mccullough DOB: 1960/11/25 MRN: [id number]   Airway Documentation:    Vent end date: 01/11/21 Vent end time: 1140   Evaluation  O2 sats: stable throughout Complications: No apparent complications Patient did tolerate procedure well. Bilateral Breath Sounds: Clear,Diminished   Patient's pO2 was 57 post rapid wean. MD said to hold off extubation until he saw him. Dr [name] came & assessed patient & said ok to extubate. Patient extubated to 6L [place]. Patient able to speak & cough post extubation. Patient met weaning parameters prior to extubation.  [name] 01/11/2021, 11:45 AM

## 2021-01-11 NOTE — Progress Notes (Signed)
     [address]       [place] [address]             [phone]       Chest tube output waxed and waned overnight Coagulopathy corrected, but pt put out 420 in 1 hr Hemodynamically stable, on 2 of levophed Will take back to the OR to wash out.  Desi Rowe Bary Leriche

## 2021-01-11 NOTE — Hospital Course (Addendum)
HPI: Patient's past medical history and cardiovascular risk factors include: HTN, HLD, BPH, mild coronary artery calcification, 26-pack-year history of smoking, quit in 2014, obesity due to excess calories.   He is referred to the office at the request of Dr. Virgina Jock.  Per Dr. Bonney Roussel note, the patient describes chest pain suggestive of angina pectoris ongoing for the last 3 years.  Patient states that he underwent stress testing which was overall a low risk study and therefore no additional testing was performed.  However, due to his symptoms the plan was to undergo coronary CTA and an echocardiogram.   Patient is asked to come to the office sooner than his scheduled office visit due to high risk findings on the coronary CTA.  At today's office visit patient does not have chest pain.  His last episode of chest pain was last week while doing effort related activities, symptoms lasted for about 2 to 3 minutes, improves with rest.  He has not required sublingual nitroglycerin tablets.  And he has not started aspirin as requested at the last office visit.   No family history of premature coronary disease or sudden cardiac death.  Dr. [name] discussed the need for coronary artery bypass grafting surgery. Potential risks, benefits, and complications of the surgery were discussed with the patient and he agreed to proceed with surgery. Pre operative carotid duplex US showed no significant internal carotid artery stenosis bilaterally. He underwent a CABG x 4 on 05/17.  Hospital Course: Patient was extubated early the morning of post operative day one. His chest tube output waxed and waned since surgery, and despite coagulapathy being corrected, he had over 420 cc of chest tube output in one hour. He was on a Levophed drip and was transfused with 4 units PRBC's.  He was taken back to the OR for exploration and washout 05/18. A small chest wall bleeder was identified and cauterized. All other surgical  anastomoses and cannulation sites were hemostatic.  He was returned to the ICU. He remained stable and progressed as expected. He was ready to transfer to 4E on post-op day 2 but a bed did not become available until the following day. He was diuresed with appropriate response. He was weaned from supplemental O2 without difficulty. He progressed to independent mobility.

## 2021-01-11 NOTE — Transfer of Care (Signed)
Immediate Anesthesia Transfer of Care Note  Patient: [employer]  Procedure(s) Performed: , MEDIASTINAL WASHOUT (N/A Chest)  Patient Location: ICU  Anesthesia Type:General  Level of Consciousness: Patient remains intubated per anesthesia plan  Airway & Oxygen Therapy: Patient remains intubated per anesthesia plan and Patient placed on Ventilator (see vital sign flow sheet for setting)  Post-op Assessment: Report given to RN and Post -op Vital signs reviewed and stable  Post vital signs: Reviewed and stable  Last Vitals:  Vitals Value Taken Time  BP    Temp    Pulse 78 01/11/21 0901  Resp 0 01/11/21 0901  SpO2 100 % 01/11/21 0901  Vitals shown include unvalidated device data.  Last Pain:  Vitals:   01/11/21 0500  TempSrc:   PainSc: Asleep      Patients Stated Pain Goal: 3 (83/25/49 8264)  Complications: No complications documented.

## 2021-01-11 NOTE — Progress Notes (Signed)
      [address]       [place] [address]             [phone]      Alert  BP 104/69   Pulse 91   Temp 99.14 F (37.3 C)   Resp 18   Ht 5\' 7"  (1.702 m)   Wt 88.9 kg   SpO2 95%   BMI 30.70 kg/m  6l  95% sat CVP= 12  Intake/Output Summary (Last 24 hours) at 01/11/2021 1800 Last data filed at 01/11/2021 1000 Gross per 24 hour  Intake 5219.66 ml  Output 3220 ml  Net 1999.66 ml   Nicardipine @ 5 mg/hr  Hct= 30, PLT 90 K(up from 79) K= 4.2, creatinine 0.86  Rafferty Postlewait C. Roxan Hockey, MD [facility] [phone]

## 2021-01-11 NOTE — Anesthesia Postprocedure Evaluation (Signed)
Anesthesia Post Note  Patient: Eugene Mccullough  Procedure(s) Performed: , MEDIASTINAL WASHOUT (N/A Chest)     Patient location during evaluation: SICU Anesthesia Type: General Level of consciousness: patient remains intubated per anesthesia plan Pain management: pain level controlled Vital Signs Assessment: post-procedure vital signs reviewed and stable Respiratory status: patient remains intubated per anesthesia plan Cardiovascular status: stable Postop Assessment: no apparent nausea or vomiting Anesthetic complications: no   No complications documented.  Last Vitals:  Vitals:   01/11/21 0600 01/11/21 0907  BP: 104/69   Pulse: 85   Resp:    Temp:    SpO2: 100% 98%    Last Pain:  Vitals:   01/11/21 0500  TempSrc:   PainSc: Asleep                 Eugene Mccullough

## 2021-01-12 ENCOUNTER — Encounter (HOSPITAL_COMMUNITY): Payer: Self-pay | Admitting: Thoracic Surgery (Cardiothoracic Vascular Surgery)

## 2021-01-12 ENCOUNTER — Inpatient Hospital Stay (HOSPITAL_COMMUNITY): Payer: 59

## 2021-01-12 LAB — CBC
HCT: 31.1 % — ABNORMAL LOW (ref 39.0–52.0)
Hemoglobin: 10.3 g/dL — ABNORMAL LOW (ref 13.0–17.0)
MCH: 28.1 pg (ref 26.0–34.0)
MCHC: 33.1 g/dL (ref 30.0–36.0)
MCV: 84.7 fL (ref 80.0–100.0)
Platelets: 90 10*3/uL — ABNORMAL LOW (ref 150–400)
RBC: 3.67 MIL/uL — ABNORMAL LOW (ref 4.22–5.81)
RDW: 15.8 % — ABNORMAL HIGH (ref 11.5–15.5)
WBC: 8.5 10*3/uL (ref 4.0–10.5)
nRBC: 0 % (ref 0.0–0.2)

## 2021-01-12 LAB — BPAM FFP
Blood Product Expiration Date: 202205232359
Blood Product Expiration Date: 202205232359
ISSUE DATE / TIME: 202205180622
ISSUE DATE / TIME: 202205181828
Unit Type and Rh: 5100
Unit Type and Rh: 5100

## 2021-01-12 LAB — POCT I-STAT 7, (LYTES, BLD GAS, ICA,H+H)
Acid-base deficit: 3 mmol/L — ABNORMAL HIGH (ref 0.0–2.0)
Bicarbonate: 20.7 mmol/L (ref 20.0–28.0)
Calcium, Ion: 1.24 mmol/L (ref 1.15–1.40)
HCT: 27 % — ABNORMAL LOW (ref 39.0–52.0)
Hemoglobin: 9.2 g/dL — ABNORMAL LOW (ref 13.0–17.0)
O2 Saturation: 89 %
Patient temperature: 37.1
Potassium: 3.6 mmol/L (ref 3.5–5.1)
Sodium: 144 mmol/L (ref 135–145)
TCO2: 22 mmol/L (ref 22–32)
pCO2 arterial: 33.4 mmHg (ref 32.0–48.0)
pH, Arterial: 7.401 (ref 7.350–7.450)
pO2, Arterial: 57 mmHg — ABNORMAL LOW (ref 83.0–108.0)

## 2021-01-12 LAB — BASIC METABOLIC PANEL
Anion gap: 6 (ref 5–15)
BUN: 6 mg/dL (ref 6–20)
CO2: 22 mmol/L (ref 22–32)
Calcium: 8.4 mg/dL — ABNORMAL LOW (ref 8.9–10.3)
Chloride: 107 mmol/L (ref 98–111)
Creatinine, Ser: 0.86 mg/dL (ref 0.61–1.24)
GFR, Estimated: >60 mL/min (ref >60)
Glucose, Bld: 107 mg/dL — ABNORMAL HIGH (ref 70–99)
Potassium: 3.8 mmol/L (ref 3.5–5.1)
Sodium: 135 mmol/L (ref 135–145)

## 2021-01-12 LAB — PREPARE FRESH FROZEN PLASMA

## 2021-01-12 LAB — GLUCOSE, CAPILLARY
Glucose-Capillary: 105 mg/dL — ABNORMAL HIGH (ref 70–99)
Glucose-Capillary: 111 mg/dL — ABNORMAL HIGH (ref 70–99)
Glucose-Capillary: 117 mg/dL — ABNORMAL HIGH (ref 70–99)
Glucose-Capillary: 122 mg/dL — ABNORMAL HIGH (ref 70–99)
Glucose-Capillary: 149 mg/dL — ABNORMAL HIGH (ref 70–99)
Glucose-Capillary: 96 mg/dL (ref 70–99)

## 2021-01-12 MED ORDER — NICARDIPINE HCL IN NACL 40-0.83 MG/200ML-% IV SOLN
5.0000 mg/h | INTRAVENOUS | Status: DC
  Administered 2021-01-12: 5 mg/h via INTRAVENOUS
  Filled 2021-01-12 (×2): qty 200

## 2021-01-12 MED ORDER — GUAIFENESIN ER 600 MG PO TB12: 600.0000 mg | ORAL_TABLET | Freq: Two times a day (BID) | ORAL | Status: DC

## 2021-01-12 MED ORDER — AMLODIPINE BESYLATE 5 MG PO TABS
5.0000 mg | ORAL_TABLET | Freq: Every day | ORAL | Status: DC
  Administered 2021-01-12 – 2021-01-15 (×4): 5 mg via ORAL
  Filled 2021-01-12 (×4): qty 1

## 2021-01-12 MED ORDER — LIDOCAINE 5 % EX PTCH
2.0000 | MEDICATED_PATCH | CUTANEOUS | Status: AC
  Administered 2021-01-12 – 2021-01-13 (×2): 2 via TRANSDERMAL
  Filled 2021-01-12 (×2): qty 2

## 2021-01-12 MED ORDER — GUAIFENESIN ER 600 MG PO TB12
600.0000 mg | ORAL_TABLET | Freq: Two times a day (BID) | ORAL | Status: AC
  Administered 2021-01-12 – 2021-01-14 (×4): 600 mg via ORAL
  Filled 2021-01-12 (×5): qty 1

## 2021-01-12 MED ORDER — INSULIN ASPART 100 UNIT/ML IJ SOLN
0.0000 [IU] | Freq: Three times a day (TID) | INTRAMUSCULAR | Status: DC
Start: 2021-01-13 — End: 2021-01-14

## 2021-01-12 MED ORDER — POTASSIUM CHLORIDE CRYS ER 10 MEQ PO TBCR
10.0000 meq | EXTENDED_RELEASE_TABLET | Freq: Two times a day (BID) | ORAL | Status: DC
  Administered 2021-01-12 – 2021-01-13 (×2): 10 meq via ORAL
  Filled 2021-01-12 (×2): qty 1

## 2021-01-12 MED ORDER — FUROSEMIDE 10 MG/ML IJ SOLN
40.0000 mg | Freq: Once | INTRAMUSCULAR | Status: AC
  Administered 2021-01-12: 40 mg via INTRAVENOUS
  Filled 2021-01-12: qty 4

## 2021-01-12 MED ORDER — LEVALBUTEROL HCL 0.63 MG/3ML IN NEBU
0.6300 mg | INHALATION_SOLUTION | Freq: Three times a day (TID) | RESPIRATORY_TRACT | Status: DC
  Administered 2021-01-12 – 2021-01-13 (×2): 0.63 mg via RESPIRATORY_TRACT
  Filled 2021-01-12 (×2): qty 3

## 2021-01-12 MED ORDER — METHOCARBAMOL 500 MG PO TABS
500.0000 mg | ORAL_TABLET | Freq: Three times a day (TID) | ORAL | Status: AC
  Administered 2021-01-12 – 2021-01-14 (×6): 500 mg via ORAL
  Filled 2021-01-12 (×6): qty 1

## 2021-01-12 MED FILL — Potassium Chloride Inj 2 mEq/ML: INTRAVENOUS | Qty: 40 | Status: AC

## 2021-01-12 MED FILL — Heparin Sodium (Porcine) Inj 1000 Unit/ML: INTRAMUSCULAR | Qty: 30 | Status: AC

## 2021-01-12 MED FILL — Lidocaine HCl Local Preservative Free (PF) Inj 2%: INTRAMUSCULAR | Qty: 15 | Status: AC

## 2021-01-12 NOTE — Progress Notes (Signed)
      [address]       [place] [address]             [phone]                 1 Day Post-Op Procedure(s) (LRB): , MEDIASTINAL WASHOUT (N/A)   Events: No events overnight Pt has multiple complaints _______________________________________________________________ Vitals: BP 115/62   Pulse 98   Temp 98.2 F (36.8 C) (Axillary)   Resp (!) 33   Ht 5\' 7"  (1.702 m)   Wt 93.9 kg   SpO2 98%   BMI 32.42 kg/m   - Neuro: Alert NAD  - Cardiovascular: Sinus  Drips: Cardene at 5.   CVP:  [5 mmHg-22 mmHg] 9 mmHg  - Pulm: Easy work of breathing  ABG    Component Value Date/Time   PHART 7.401 01/11/2021 1106   PCO2ART 33.4 01/11/2021 1106   PO2ART 57 (L) 01/11/2021 1106   HCO3 20.7 01/11/2021 1106   TCO2 22 01/11/2021 1106   ACIDBASEDEF 3.0 (H) 01/11/2021 1106   O2SAT 89.0 01/11/2021 1106    - Abd: Nondistended - Extremity: Warm  .Intake/Output      05/18 0701 05/19 0700 05/19 0701 05/20 0700   I.V. (mL/kg) 2807.1 (29.9) 272.3 (2.9)   Blood 1458    Other 0    NG/GT     IV Piggyback 700.1    Total Intake(mL/kg) 4965.2 (52.9) 272.3 (2.9)   Urine (mL/kg/hr) 3370 (1.5) 350 (0.5)   Emesis/NG output     Blood 150    Chest Tube 430 250   Total Output 3950 600   Net +1015.2 -327.7           _______________________________________________________________ Labs: CBC Latest Ref Rng & Units 01/12/2021 01/11/2021 01/11/2021  WBC 4.0 - 10.5 K/uL 8.5 7.5 -  Hemoglobin 13.0 - 17.0 g/dL 10.3(L) 10.2(L) 9.2(L)  Hematocrit 39.0 - 52.0 % 31.1(L) 29.7(L) 27.0(L)  Platelets 150 - 400 K/uL 90(L) 90(L) -   CMP Latest Ref Rng & Units 01/12/2021 01/11/2021 01/11/2021  Glucose 70 - 99 mg/dL 107(H) 103(H) -  BUN 6 - 20 mg/dL 6 7 -  Creatinine 0.61 - 1.24 mg/dL 0.86 0.86 -  Sodium 135 - 145 mmol/L 135 139 144  Potassium 3.5 - 5.1 mmol/L 3.8 4.2 3.6  Chloride 98 - 111 mmol/L 107 111 -  CO2 22 - 32 mmol/L 22 23 -  Calcium 8.9 - 10.3 mg/dL 8.4(L) 8.6(L) -  Total  Protein 6.5 - 8.1 g/dL - - -  Total Bilirubin 0.3 - 1.2 mg/dL - - -  Alkaline Phos 38 - 126 U/L - - -  AST 15 - 41 U/L - - -  ALT 0 - 44 U/L - - -    CXR: Pulmonary vascular congestion.  Small left effusion.  _______________________________________________________________  Assessment and Plan: POD 2 s/p CABG 4.  POD1  status post mediastinal washout.  Neuro: Pain controlled.  Adding lidocaine patches, and muscle relaxants.  Pain is mostly to due chest tube CV: On aspirin statin and beta-blocker.  Will transition to Cardene to Norvasc for radial artery harvest. Pulm: Continue pulmonary toilet.  Will keep CT 1 more day Renal: Excellent urine output. Will diurese today GI: on diet today. Heme: Stable ID: Afebrile. Endo: Sliding scale insulin. Dispo: Continue ICU care.  Floor tomorrow    Lajuana Matte 01/12/2021 2:46 PM

## 2021-01-12 NOTE — Progress Notes (Signed)
[facility] Evening Rounds  POD #2 s/p CABG HD stable CTs still in place BP 94/64   Pulse 89   Temp 98.5 F (36.9 C) (Oral)   Resp 15   Ht 5\' 7"  (1.702 m)   Wt 93.9 kg   SpO2 99%   BMI 32.42 kg/m  Alert/oriented CTA RRR  Intake/Output Summary (Last 24 hours) at 01/12/2021 1808 Last data filed at 01/12/2021 1700 Gross per 24 hour  Intake 1130.8 ml  Output 2590 ml  Net -1459.2 ml   A/p: doing well Continue present management K+ supp Anshi Jalloh Z. Orvan Seen, [place]

## 2021-01-13 LAB — GLUCOSE, CAPILLARY
Glucose-Capillary: 101 mg/dL — ABNORMAL HIGH (ref 70–99)
Glucose-Capillary: 96 mg/dL (ref 70–99)
Glucose-Capillary: 103 mg/dL — ABNORMAL HIGH (ref 70–99)
Glucose-Capillary: 102 mg/dL — ABNORMAL HIGH (ref 70–99)

## 2021-01-13 LAB — BASIC METABOLIC PANEL
Anion gap: 3 — ABNORMAL LOW (ref 5–15)
BUN: 9 mg/dL (ref 6–20)
CO2: 26 mmol/L (ref 22–32)
Calcium: 8.4 mg/dL — ABNORMAL LOW (ref 8.9–10.3)
Chloride: 102 mmol/L (ref 98–111)
Creatinine, Ser: 0.83 mg/dL (ref 0.61–1.24)
GFR, Estimated: >60 mL/min (ref >60)
Glucose, Bld: 100 mg/dL — ABNORMAL HIGH (ref 70–99)
Potassium: 3.8 mmol/L (ref 3.5–5.1)
Sodium: 131 mmol/L — ABNORMAL LOW (ref 135–145)

## 2021-01-13 LAB — ECHO INTRAOPERATIVE TEE
Height: 67 in
Weight: 3136 oz

## 2021-01-13 MED ORDER — SODIUM CHLORIDE 0.9 % IV SOLN
250.0000 mL | INTRAVENOUS | Status: DC | PRN
  Administered 2021-01-15: 250 mL via INTRAVENOUS

## 2021-01-13 MED ORDER — SODIUM CHLORIDE 0.9% FLUSH
3.0000 mL | INTRAVENOUS | Status: DC | PRN
  Administered 2021-01-15: 3 mL via INTRAVENOUS

## 2021-01-13 MED ORDER — SODIUM CHLORIDE 0.9% FLUSH
3.0000 mL | Freq: Two times a day (BID) | INTRAVENOUS | Status: DC
  Administered 2021-01-13 – 2021-01-15 (×3): 3 mL via INTRAVENOUS

## 2021-01-13 MED ORDER — POTASSIUM CHLORIDE CRYS ER 20 MEQ PO TBCR
40.0000 meq | EXTENDED_RELEASE_TABLET | Freq: Every day | ORAL | Status: DC
  Administered 2021-01-13 – 2021-01-15 (×3): 40 meq via ORAL
  Filled 2021-01-13 (×3): qty 2

## 2021-01-13 MED ORDER — LEVALBUTEROL HCL 0.63 MG/3ML IN NEBU: 0.6300 mg | INHALATION_SOLUTION | Freq: Three times a day (TID) | RESPIRATORY_TRACT | Status: DC | PRN

## 2021-01-13 MED ORDER — MENTHOL 3 MG MT LOZG
1.0000 | LOZENGE | OROMUCOSAL | Status: DC | PRN
  Filled 2021-01-13: qty 9

## 2021-01-13 MED ORDER — FUROSEMIDE 40 MG PO TABS
40.0000 mg | ORAL_TABLET | Freq: Every day | ORAL | Status: DC
  Administered 2021-01-13 – 2021-01-15 (×3): 40 mg via ORAL
  Filled 2021-01-13 (×3): qty 1

## 2021-01-13 MED ORDER — ~~LOC~~ CARDIAC SURGERY, PATIENT & FAMILY EDUCATION: Freq: Once | Status: AC

## 2021-01-13 MED ORDER — TRIAMCINOLONE ACETONIDE 0.1 % EX CREA
TOPICAL_CREAM | Freq: Two times a day (BID) | CUTANEOUS | Status: DC
  Administered 2021-01-13: 1 via TOPICAL
  Filled 2021-01-13: qty 15

## 2021-01-13 MED FILL — Heparin Sodium (Porcine) Inj 1000 Unit/ML: INTRAMUSCULAR | Qty: 10 | Status: AC

## 2021-01-13 MED FILL — Electrolyte-R (PH 7.4) Solution: INTRAVENOUS | Qty: 7000 | Status: AC

## 2021-01-13 MED FILL — Calcium Chloride Inj 10%: INTRAVENOUS | Qty: 10 | Status: AC

## 2021-01-13 MED FILL — Lidocaine HCl Local Preservative Free (PF) Inj 2%: INTRAMUSCULAR | Qty: 15 | Status: AC

## 2021-01-13 NOTE — Plan of Care (Signed)
  Problem: Clinical Measurements: Goal: Ability to maintain clinical measurements within normal limits will improve Outcome: Progressing Goal: Will remain free from infection Outcome: Progressing Goal: Diagnostic test results will improve Outcome: Progressing Goal: Respiratory complications will improve Outcome: Progressing Goal: Cardiovascular complication will be avoided Outcome: Progressing   Problem: Activity: Goal: Risk for activity intolerance will decrease Outcome: Progressing   Problem: Nutrition: Goal: Adequate nutrition will be maintained Outcome: Progressing   Problem: Coping: Goal: Level of anxiety will decrease Outcome: Progressing   Problem: Elimination: Goal: Will not experience complications related to bowel motility Outcome: Progressing Goal: Will not experience complications related to urinary retention Outcome: Progressing   Problem: Pain Managment: Goal: General experience of comfort will improve Outcome: Progressing   Problem: Safety: Goal: Ability to remain free from injury will improve Outcome: Progressing   Problem: Skin Integrity: Goal: Risk for impaired skin integrity will decrease Outcome: Progressing   Problem: Activity: Goal: Risk for activity intolerance will decrease Outcome: Progressing   Problem: Cardiac: Goal: Will achieve and/or maintain hemodynamic stability Outcome: Progressing   Problem: Clinical Measurements: Goal: Postoperative complications will be avoided or minimized Outcome: Progressing   Problem: Respiratory: Goal: Respiratory status will improve Outcome: Progressing   Problem: Skin Integrity: Goal: Wound healing without signs and symptoms of infection Outcome: Progressing Goal: Risk for impaired skin integrity will decrease Outcome: Progressing   Problem: Urinary Elimination: Goal: Ability to achieve and maintain adequate renal perfusion and functioning will improve Outcome: Progressing

## 2021-01-13 NOTE — Progress Notes (Signed)
CARDIAC REHAB PHASE I   PRE:  Rate/Rhythm: 82 SR  BP:  Sitting: 110/78      SaO2: 96 RA  MODE:  Ambulation: 370 ft   POST:  Rate/Rhythm: 95 SR  BP:  Sitting: 100/69    SaO2: 97 RA   Pt ambulated 312ft in hallway standby assist with EVA. Pt took several short standing rest breaks, no real complaints. Pt returned to recliner. Demonstrating 750 on IS. Encouraged continued ambulation and IS use. Will continue to follow.  [id number] Rufina Falco, RN BSN 01/13/2021 1:41 PM

## 2021-01-13 NOTE — Progress Notes (Signed)
Pt ambulating in hall with nurse at this time

## 2021-01-13 NOTE — Discharge Summary (Signed)
Physician Discharge Summary       [address]       [place] [address]             [phone]    Patient ID: Eugene Mccullough MRN: [id number] DOB/AGE: 1961-05-18 60 y.o.  Admit date: 01/10/2021 Discharge date: 01/15/2021  Admission Diagnoses:  Coronary artery disease Angina pectoris Hypertension BPH GERD  Discharge Diagnoses:  1. S/P CABG x 4 2. History of the following: Anemia    . BPH (benign prostatic hyperplasia)   . Eczema   . Gastric ulcer   . GERD (gastroesophageal reflux disease)   . Gout   . H. pylori infection   . Hyperlipidemia   . Hypertension   . IBS (irritable bowel syndrome)   . Sleep apnea    Does not use CPAP    Consults: None  Procedure (s):  1. CABG X 4.  LIMA-LAD, Left radial artery - OM2 (proximal of diagonal vein graft), RSVG D1, and D2.  Open left radial harvest and Endoscopic greater saphenous vein harvest on the right by Dr. [name] on 01/10/2021.  2. Mediastinal re exploration by Dr. [name] on 01/11/2021  History of Presenting Illness: Patient's past medical history and cardiovascular risk factors include: HTN, HLD, BPH, mild coronary artery calcification, 26-pack-year history of smoking, quit in 2014, obesity due to excess calories.   He is referred to the office at the request of Dr. Virgina Jock.  Per Dr. Bonney Roussel note, the patient describes chest pain suggestive of angina pectoris ongoing for the last 3 years.  Patient states that he underwent stress testing which was overall a low risk study and therefore no additional testing was performed.  However, due to his symptoms the plan was to undergo coronary CTA and an echocardiogram.   Patient is asked to come to the office sooner than his scheduled office visit due to high risk findings on the coronary CTA.  At today's office visit patient does not have chest pain.  His last episode of chest pain was last week while doing effort related activities,  symptoms lasted for about 2 to 3 minutes, improves with rest.  He has not required sublingual nitroglycerin tablets.  And he has not started aspirin as requested at the last office visit.   No family history of premature coronary disease or sudden cardiac death. Dr. [name] discussed the need for coronary artery bypass grafting surgery. Potential risks, benefits, and complications of the surgery were discussed with the patient and he agreed to proceed with surgery. Pre operative carotid duplex US showed no significant internal carotid artery stenosis bilaterally. He underwent a CABG x 4 on 05/17.  Brief Hospital Course:   Patient was extubated early the morning of post operative day one. His chest tube output waxed and waned since surgery, and despite coagulapathy being corrected, he had over 420 cc of chest tube output in one hour. He was on a Levophed drip and was transfused with 4 units PRBC's.  He was taken back to the OR for exploration and washout 05/18. A small chest wall bleeder was identified and cauterized. All other surgical anastomoses and cannulation sites were hemostatic.  He was returned to the ICU. He remained stable and progressed as expected. He was ready to transfer to 4E on post-op day 2 but a bed did not become available until the following day. He was diuresed with appropriate response. He was weaned from supplemental O2 without difficulty. He progressed to independent mobility. He developed  some induration and tenderness along the RLE EVH incision.  This had no erythema. The sternotomy and left arm incision were healing with no sign of complication at the time of discharge.   Latest Vital Signs: Blood pressure 115/75, pulse 84, temperature 98.2 F (36.8 C), temperature source Oral, resp. rate (!) 22, height 5\' 7"  (1.702 m), weight 90 kg, SpO2 100 %.  Physical Exam:  General appearance: alert and cooperative Neurologic: intact Heart: regular rate and rhythm, no arrhythmias on  monitor review Lungs: clear to auscultation bilaterally Extremities: extremities normal, atraumatic, no cyanosis or edema Wound: The sternotomy incision is open to air and is intact and dry. There is some induration along the [facility] tunnel c/w hematoma. No erythema. The left radial harvest incision is intact and dry.  Discharge Condition: Stable and discharged to home.  Recent laboratory studies:  Lab Results  Component Value Date   WBC 6.2 01/14/2021   HGB 10.2 (L) 01/14/2021   HCT 30.3 (L) 01/14/2021   MCV 85.8 01/14/2021   PLT 120 (L) 01/14/2021   Lab Results  Component Value Date   NA 131 (L) 01/14/2021   K 4.0 01/14/2021   CL 102 01/14/2021   CO2 21 (L) 01/14/2021   CREATININE 1.00 01/14/2021   GLUCOSE 83 01/14/2021      Diagnostic Studies: DG Chest 2 View  Result Date: 01/07/2021 CLINICAL DATA:  60 year old male with coronary artery disease. EXAM: CHEST - 2 VIEW COMPARISON:  None. FINDINGS: No focal consolidation, pleural effusion, or pneumothorax. The cardiac silhouette is within limits. No acute osseous pathology. Right upper quadrant cholecystectomy clips. IMPRESSION: No active cardiopulmonary disease. Electronically Signed   By: Anner Crete M.D.   On: 01/07/2021 22:24   CARDIAC CATHETERIZATION  Result Date: 12/21/2020 LM: Normal LAD: Mid 100% occlusion after Diag 2         Collaterals to distal LAD from Diag 2 and RPDA         High Diag 1 patent         Diag 2 diffuse 60-70% disease LCx: Proximal tandem 75% stenosis        Undefilled and sub-totally occluded OM1        Mid LCx 7% stenosis RCA: Mild luminal irregularities Attempted wiring of LAD to assess chronicity of LAD occlusion. Based on wire behavior, appears to be chronic Continue optimal medical management for now. Fortunately, he does not have any LM involvement and EF is normal. Any revascularization recommendation will be for symptomatic improvement.  In addition to optimal medical therapy, following are the  options for revascularization #1. CABG to distal LAD, ?diag2, OM1-if graftable #2. PCI to LCx +/- OM1, stage LAD CTO PCI CABG more likely to achieve complete revascularization with less procedures. Will continue outpatient discussion re: optimal revascularization Nigel Mormon, MD Pager: [phone] Office: [phone]                   DG Chest Port 1 View  Result Date: 01/12/2021 CLINICAL DATA:  Respiratory distress EXAM: PORTABLE CHEST 1 VIEW COMPARISON:  01/11/2021 FINDINGS: Interval extubation. Lung volumes are small, however, pulmonary insufflation remains stable. Right internal jugular hemodialysis catheter is unchanged with its tip within the superior vena cava. Mediastinal drain and bilateral chest tubes are in place. Moderate left basilar atelectasis or infiltrate has improved. Tiny left pleural effusion is unchanged. No pneumothorax. Right lung is clear. Coronary artery bypass grafting has been performed. Mild cardiomegaly is stable. Pulmonary vascularity is normal. IMPRESSION: Interval  extubation with preservation of pulmonary insufflation. Lung volumes remain small. Bilateral chest tubes in place. No pneumothorax. Small left pleural effusion. Improving left basilar atelectasis or infiltrate. Stable cardiomegaly Electronically Signed   By: Fidela Salisbury MD   On: 01/12/2021 06:18   DG CHEST PORT 1 VIEW  Result Date: 01/11/2021 CLINICAL DATA:  Back to or this a.m. head chest wall bleed, CABG EXAM: PORTABLE CHEST 1 VIEW COMPARISON:  Same day radiograph FINDINGS: Intact median sternotomy wires. CABG. Endotracheal tube overlies the midthoracic trachea. There are mediastinal drains and a right neck approach catheter overlying the distal superior vena cava. Unchanged enlarged cardiomediastinal silhouette. There is a small left pleural effusion with adjacent basilar consolidation. Minimal interstitial opacities. Unchanged right medial basilar opacity. No visible pneumothorax. IMPRESSION:  Postoperative chest with unchanged cardiomegaly, small left pleural effusion with adjacent left basilar consolidation, and right medial basilar atelectasis. Mild interstitial edema. Endotracheal tube overlies the midthoracic trachea. Electronically Signed   By: Maurine Simmering   On: 01/11/2021 12:46   DG Chest Port 1 View  Result Date: 01/11/2021 CLINICAL DATA:  Status postextubation EXAM: PORTABLE CHEST 1 VIEW COMPARISON:  Jan 10, 2021 FINDINGS: Endotracheal tube and nasogastric tube have been removed. Central catheter tip in superior vena cava. No pneumothorax. There is consolidation in the left lower lobe with small left pleural effusion. There is atelectasis in the medial right base. There is cardiomegaly with pulmonary vascularity normal. Status post coronary artery bypass grafting. No adenopathy. No bone lesions. IMPRESSION: Stable central catheter positioning. No pneumothorax. Consolidation left lower lobe with small left pleural effusion. Suspect combination of atelectasis and infiltrate left lower lobe. Atelectasis medial right base. Stable cardiomegaly. Postoperative changes noted. Electronically Signed   By: Lowella Grip III M.D.   On: 01/11/2021 08:21   DG Chest Port 1 View  Result Date: 01/10/2021 CLINICAL DATA:  Status post CABG. EXAM: PORTABLE CHEST 1 VIEW COMPARISON:  Jan 06, 2021 FINDINGS: Endotracheal tube is seen with its distal tip approximately 2.8 cm from the carina. A nasogastric tube is noted with its distal end extending into the body of the stomach. A right internal jugular venous catheter is seen with its distal tip noted near the junction of the superior vena cava and right atrium. Multiple sternal wires and vascular clips are seen. Mild atelectasis and/or infiltrate is seen within the left lung base. Ill-defined chest tubes seen overlying both lung bases. There is no evidence of a pleural effusion or pneumothorax. The heart size and mediastinal contours are within normal limits.  The visualized skeletal structures are unremarkable. IMPRESSION: 1. Status post median sternotomy/CABG. 2. Mild left basilar atelectasis and/or infiltrate. Electronically Signed   By: Virgina Norfolk M.D.   On: 01/10/2021 15:08   ECHO INTRAOPERATIVE TEE  Result Date: 01/13/2021  *INTRAOPERATIVE TRANSESOPHAGEAL REPORT *  Patient Name:   MORDECAI TINDOL Date of Exam: 01/10/2021 Medical Rec #:  [id number]       Height:       67.0 in Accession #:    [id number]      Weight:       196.0 lb Date of Birth:  12/28/1960       BSA:          2.00 m Patient Age:    60 years        BP:           123/73 mmHg Patient Gender: M  HR:           56 bpm. Exam Location:  Anesthesiology Transesophogeal exam was perform intraoperatively during surgical procedure. Patient was closely monitored under general anesthesia during the entirety of examination. Indications:     Coronary Artery Disease Performing Phys: [id number] Lucile Crater LIGHTFOOT Diagnosing Phys: Albertha Ghee MD Complications: No known complications during this procedure. POST-OP IMPRESSIONS Overall, there were no significant changes from pre-bypass. PRE-OP FINDINGS  Left Ventricle: The left ventricle has normal systolic function, with an ejection fraction of 55-60%. The cavity size was normal. There is no increase in left ventricular wall thickness. Right Ventricle: The right ventricle has normal systolic function. The cavity was normal. There is no increase in right ventricular wall thickness. Left Atrium: Left atrial size was normal in size. No left atrial/left atrial appendage thrombus was detected. Right Atrium: Right atrial size was normal in size. Interatrial Septum: No atrial level shunt detected by color flow Doppler. Pericardium: There is no evidence of pericardial effusion. Mitral Valve: The mitral valve is normal in structure. Mitral valve regurgitation is trivial by color flow Doppler. There is No evidence of mitral stenosis. Tricuspid Valve: The  tricuspid valve was normal in structure. Tricuspid valve regurgitation is trivial by color flow Doppler. Aortic Valve: The aortic valve is normal in structure. Aortic valve regurgitation was not visualized by color flow Doppler. There is no stenosis of the aortic valve. Pulmonic Valve: The pulmonic valve was normal in structure. Pulmonic valve regurgitation is not visualized by color flow Doppler. Aorta: The aortic root, ascending aorta and aortic arch are normal in size and structure.  Albertha Ghee MD Electronically signed by Albertha Ghee MD Signature Date/Time: 01/13/2021/10:21:08 AM    Final    VAS US DOPPLER PRE CABG  Result Date: 01/06/2021 PREOPERATIVE VASCULAR EVALUATION Patient Name:  GURNEY HONORATO  Date of Exam:   01/06/2021 Medical Rec #: [id number]        Accession #:    [id number] Date of Birth: 05-16-1961        Patient Gender: M Patient Age:   60Y Exam Location:  [facility] Procedure:      VAS US DOPPLER PRE CABG Referring Phys: [id number] HARRELL O LIGHTFOOT --------------------------------------------------------------------------------  Risk Factors: Hypertension, hyperlipidemia, past history of smoking, coronary               artery disease. Performing Technologist: Oda Cogan RDMS, RVT  Examination Guidelines: A complete evaluation includes B-mode imaging, spectral Doppler, color Doppler, and power Doppler as needed of all accessible portions of each vessel. Bilateral testing is considered an integral part of a complete examination. Limited examinations for reoccurring indications may be performed as noted.  Right Carotid Findings: +----------+--------+--------+--------+--------+------------------+           PSV cm/sEDV cm/sStenosisDescribeComments           +----------+--------+--------+--------+--------+------------------+ CCA Prox  91      19                                         +----------+--------+--------+--------+--------+------------------+ CCA Distal96       23                                         +----------+--------+--------+--------+--------+------------------+ ICA Prox  41      16  intimal thickening +----------+--------+--------+--------+--------+------------------+ ICA Distal81      32                                         +----------+--------+--------+--------+--------+------------------+ ECA       114     14                                         +----------+--------+--------+--------+--------+------------------+ Portions of this table do not appear on this page. +----------+--------+-------+----------------+------------+           PSV cm/sEDV cmsDescribe        Arm Pressure +----------+--------+-------+----------------+------------+ Subclavian99             Multiphasic, WNL             +----------+--------+-------+----------------+------------+ +---------+--------+--+--------+--+---------+ VertebralPSV cm/s44EDV cm/s14Antegrade +---------+--------+--+--------+--+---------+ Left Carotid Findings: +----------+--------+--------+--------+--------+--------+           PSV cm/sEDV cm/sStenosisDescribeComments +----------+--------+--------+--------+--------+--------+ CCA Prox  87      26                               +----------+--------+--------+--------+--------+--------+ CCA Distal92      27                               +----------+--------+--------+--------+--------+--------+ ICA Prox  70      22                               +----------+--------+--------+--------+--------+--------+ ICA Distal69      27                               +----------+--------+--------+--------+--------+--------+ ECA       129     19                               +----------+--------+--------+--------+--------+--------+ +----------+--------+--------+----------------+------------+ SubclavianPSV cm/sEDV cm/sDescribe        Arm Pressure  +----------+--------+--------+----------------+------------+           125             Multiphasic, WNL             +----------+--------+--------+----------------+------------+ +---------+--------+--+--------+--+---------+ VertebralPSV cm/s36EDV cm/s15Antegrade +---------+--------+--+--------+--+---------+  ABI Findings: +--------+------------------+-----+---------+--------+ Right   Rt Pressure (mmHg)IndexWaveform Comment  +--------+------------------+-----+---------+--------+ [id number]                    triphasic         +--------+------------------+-----+---------+--------+ PTA     148               1.25 triphasic         +--------+------------------+-----+---------+--------+ DP      142               1.20 triphasic         +--------+------------------+-----+---------+--------+ +--------+------------------+-----+---------+-------+ Left    Lt Pressure (mmHg)IndexWaveform Comment +--------+------------------+-----+---------+-------+ [id number]                    triphasic        +--------+------------------+-----+---------+-------+ PTA  152               1.29 triphasic        +--------+------------------+-----+---------+-------+ DP      127               1.08 triphasic        +--------+------------------+-----+---------+-------+ +-------+---------------+----------------+ ABI/TBIToday's ABI/TBIPrevious ABI/TBI +-------+---------------+----------------+ Right  1.2                             +-------+---------------+----------------+ Left   1.2                             +-------+---------------+----------------+  Right Doppler Findings: +--------+--------+-----+---------+--------+ Site    PressureIndexDoppler  Comments +--------+--------+-----+---------+--------+ [id number]          triphasic         +--------+--------+-----+---------+--------+ Radial               triphasic          +--------+--------+-----+---------+--------+ Ulnar                triphasic         +--------+--------+-----+---------+--------+  Left Doppler Findings: +--------+--------+-----+---------+--------+ Site    PressureIndexDoppler  Comments +--------+--------+-----+---------+--------+ [id number]          triphasic         +--------+--------+-----+---------+--------+ Radial               triphasic         +--------+--------+-----+---------+--------+ Ulnar                triphasic         +--------+--------+-----+---------+--------+  Summary: Right Carotid: The extracranial vessels were near-normal with only minimal wall                thickening or plaque. Left Carotid: The extracranial vessels were near-normal with only minimal wall               thickening or plaque. Vertebrals: Bilateral vertebral arteries demonstrate antegrade flow. Right ABI: Resting right ankle-brachial index is within normal range. No evidence of significant right lower extremity arterial disease. Left ABI: Resting left ankle-brachial index is within normal range. No evidence of significant left lower extremity arterial disease. Right Upper Extremity: Doppler waveforms decrease >50% with right radial compression. Doppler waveforms remain within normal limits with right ulnar compression. Left Upper Extremity: Doppler waveforms decrease >50% with left radial compression. Doppler waveform obliterate with left ulnar compression.  Electronically signed by Servando Snare MD on 01/06/2021 at 4:57:41 PM.    Final    Discharge Instructions    Amb Referral to Cardiac Rehabilitation   Complete by: As directed    Diagnosis: CABG   CABG X ___: 4   After initial evaluation and assessments completed: Virtual Based Care may be provided alone or in conjunction with Phase 2 Cardiac Rehab based on patient barriers.: Yes      Discharge Medications: Allergies as of 01/15/2021      Reactions   Oxycodone Itching    Hydrocodone-acetaminophen Itching      Medication List    STOP taking these medications   losartan 50 MG tablet Commonly known as: COZAAR     TAKE these medications   amLODipine 5 MG tablet Commonly known as: NORVASC Take 1 tablet (5 mg total) by mouth daily.   aspirin 325 MG EC tablet Take 1 tablet (325 mg  total) by mouth daily. What changed:   medication strength  how much to take  additional instructions   atorvastatin 80 MG tablet Commonly known as: LIPITOR Take 1 tablet (80 mg total) by mouth at bedtime.   finasteride 5 MG tablet Commonly known as: PROSCAR TAKE 1 TABLET BY MOUTH EVERY DAY   hydrOXYzine 25 MG tablet Commonly known as: ATARAX/VISTARIL Take 25 mg by mouth 3 (three) times daily as needed for itching.   metoprolol tartrate 25 MG tablet Commonly known as: LOPRESSOR Take 1 tablet (25 mg total) by mouth 2 (two) times daily. What changed:   medication strength  how much to take   nitroGLYCERIN 0.4 MG SL tablet Commonly known as: NITROSTAT Place 1 tablet (0.4 mg total) under the tongue every 5 (five) minutes as needed for chest pain. If you require more than two tablets five minutes apart go to the nearest ER via EMS.   pantoprazole 40 MG tablet Commonly known as: PROTONIX Take 40 mg by mouth 2 (two) times daily.   tamsulosin 0.4 MG Caps capsule Commonly known as: FLOMAX Take 0.4 mg by mouth daily.   traMADol 50 MG tablet Commonly known as: ULTRAM Take 1 tablet (50 mg total) by mouth every 6 (six) hours as needed for up to 5 days for moderate pain.      The patient has been discharged on:   1.Beta Blocker:  Yes [  x ]                              No   [   ]                              If No, reason:  2.Ace Inhibitor/ARB: Yes [   ]                                     No  [  x  ]                                     If No, reason: not appropriate with current BP.  3.Statin:   Yes [ x  ]                  No  [   ]                   If No, reason:  4.Ecasa:  Yes  [ x  ]                  No   [   ]                  If No, reason:  Follow Up Appointments:  Follow-up Information    Lajuana Matte, MD Follow up on 01/27/2021.   Specialty: Cardiothoracic Surgery Why: Appointment is VIRTUAL, please do NOT go to the office. Dr. [name] will contact you. Appointment time is at  Contact information: [place] [id number] [phone]        Adrian Prows, MD. Go on 01/26/2021.   Specialty: Cardiology Why: Appointment time is at 2:30 pm Contact information: [address]  [place] [address] [phone]               Signed: [name] 01/15/2021, 10:11 AM

## 2021-01-13 NOTE — Progress Notes (Signed)
Patient ID: Eugene Mccullough, male   DOB: 02-19-1961, 60 y.o.   MRN: [id number] [facility] Evening Rounds:  Hemodynamically stable in sinus rhythm. Good urine output  Chest tubes out He is waiting on 4E bed.

## 2021-01-13 NOTE — Progress Notes (Signed)
      [address]       [place] [address]             [phone]                 2 Days Post-Op Procedure(s) (LRB): , MEDIASTINAL WASHOUT (N/A)   Events: No events overnight Pt has multiple complaints _______________________________________________________________ Vitals: BP 112/71 (BP Location: Right Arm)   Pulse 89   Temp 98.7 F (37.1 C) (Oral)   Resp 19   Ht 5\' 7"  (1.702 m)   Wt 90.1 kg   SpO2 99%   BMI 31.11 kg/m   - Neuro: Alert NAD  - Cardiovascular: Sinus  Drips: none CVP:  [9 mmHg] 9 mmHg  - Pulm: Easy work of breathing  ABG    Component Value Date/Time   PHART 7.401 01/11/2021 1106   PCO2ART 33.4 01/11/2021 1106   PO2ART 57 (L) 01/11/2021 1106   HCO3 20.7 01/11/2021 1106   TCO2 22 01/11/2021 1106   ACIDBASEDEF 3.0 (H) 01/11/2021 1106   O2SAT 89.0 01/11/2021 1106    - Abd: Nondistended - Extremity: Warm  .Intake/Output      05/19 0701 05/20 0700 05/20 0701 05/21 0700   I.V. (mL/kg) 413.2 (4.6)    Blood     Other 40    IV Piggyback     Total Intake(mL/kg) 453.2 (5)    Urine (mL/kg/hr) 1430 (0.7) 70 (0.2)   Blood     Chest Tube 430 10   Total Output 1860 80   Net -1406.8 -80           _______________________________________________________________ Labs: CBC Latest Ref Rng & Units 01/12/2021 01/11/2021 01/11/2021  WBC 4.0 - 10.5 K/uL 8.5 7.5 -  Hemoglobin 13.0 - 17.0 g/dL 10.3(L) 10.2(L) 9.2(L)  Hematocrit 39.0 - 52.0 % 31.1(L) 29.7(L) 27.0(L)  Platelets 150 - 400 K/uL 90(L) 90(L) -   CMP Latest Ref Rng & Units 01/13/2021 01/12/2021 01/11/2021  Glucose 70 - 99 mg/dL 100(H) 107(H) 103(H)  BUN 6 - 20 mg/dL 9 6 7   Creatinine 0.61 - 1.24 mg/dL 0.83 0.86 0.86  Sodium 135 - 145 mmol/L 131(L) 135 139  Potassium 3.5 - 5.1 mmol/L 3.8 3.8 4.2  Chloride 98 - 111 mmol/L 102 107 111  CO2 22 - 32 mmol/L 26 22 23   Calcium 8.9 - 10.3 mg/dL 8.4(L) 8.4(L) 8.6(L)  Total Protein 6.5 - 8.1 g/dL - - -  Total Bilirubin 0.3 - 1.2  mg/dL - - -  Alkaline Phos 38 - 126 U/L - - -  AST 15 - 41 U/L - - -  ALT 0 - 44 U/L - - -    CXR: -  _______________________________________________________________  Assessment and Plan: POD 3 s/p CABG 4.  POD2  status post mediastinal washout.  Neuro: Pain controlled.  CV: On aspirin statin and beta-blocker.  on Norvasc for radial artery harvest. Pulm: Continue pulmonary toilet.  Will remove CTs Renal: Excellent urine output. Will diurese today GI: on diet today. Heme: Stable ID: Afebrile. Endo: Sliding scale insulin. Dispo: floor today    Lajuana Matte 01/13/2021 11:57 AM

## 2021-01-13 NOTE — Anesthesia Postprocedure Evaluation (Signed)
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  Procedure(s) Performed: CORONARY ARTERY BYPASS GRAFTING (CABG) X  FOUR ON PUMP USING LEFT INTERNAL MAMMARY ARTERY, LEFT RADIAL ARTERY, RIGHT ENDOSCOPIC GREATER SAPHENOUS VEIN HARVEST CONDUITS (N/A Chest) RADIAL ARTERY HARVEST (Left Arm Lower) TRANSESOPHAGEAL ECHOCARDIOGRAM (TEE) (N/A )     Patient location during evaluation: SICU Anesthesia Type: General Level of consciousness: sedated Pain management: pain level controlled Vital Signs Assessment: post-procedure vital signs reviewed and stable Respiratory status: patient remains intubated per anesthesia plan Cardiovascular status: stable Postop Assessment: no apparent nausea or vomiting Anesthetic complications: no   No complications documented.  Last Vitals:  Vitals:   01/13/21 0700 01/13/21 0902  BP:    Pulse:    Resp:    Temp: 37.1 C   SpO2:  99%    Last Pain:  Vitals:   01/13/21 0745  TempSrc:   PainSc: [place]

## 2021-01-14 LAB — TYPE AND SCREEN
ABO/RH(D): O POS
Antibody Screen: NEGATIVE
Unit division: 0
Unit division: 0
Unit division: 0
Unit division: 0
Unit division: 0
Unit division: 0
Unit division: 0

## 2021-01-14 LAB — BPAM RBC
Blood Product Expiration Date: 202206042359
Blood Product Expiration Date: 202206112359
Blood Product Expiration Date: 202206132359
Blood Product Expiration Date: 202206132359
Blood Product Expiration Date: 202206132359
Blood Product Expiration Date: 202206132359
Blood Product Expiration Date: 202206142359
ISSUE DATE / TIME: 202205172059
ISSUE DATE / TIME: 202205180554
ISSUE DATE / TIME: 202205180554
ISSUE DATE / TIME: 202205180554
ISSUE DATE / TIME: 202205180554
Unit Type and Rh: 5100
Unit Type and Rh: 5100
Unit Type and Rh: 5100
Unit Type and Rh: 5100
Unit Type and Rh: 5100
Unit Type and Rh: 5100
Unit Type and Rh: 5100

## 2021-01-14 LAB — BASIC METABOLIC PANEL
Anion gap: 8 (ref 5–15)
BUN: 13 mg/dL (ref 6–20)
CO2: 21 mmol/L — ABNORMAL LOW (ref 22–32)
Calcium: 8.5 mg/dL — ABNORMAL LOW (ref 8.9–10.3)
Chloride: 102 mmol/L (ref 98–111)
Creatinine, Ser: 1 mg/dL (ref 0.61–1.24)
GFR, Estimated: >60 mL/min (ref >60)
Glucose, Bld: 83 mg/dL (ref 70–99)
Potassium: 4 mmol/L (ref 3.5–5.1)
Sodium: 131 mmol/L — ABNORMAL LOW (ref 135–145)

## 2021-01-14 LAB — CBC
HCT: 30.3 % — ABNORMAL LOW (ref 39.0–52.0)
Hemoglobin: 10.2 g/dL — ABNORMAL LOW (ref 13.0–17.0)
MCH: 28.9 pg (ref 26.0–34.0)
MCHC: 33.7 g/dL (ref 30.0–36.0)
MCV: 85.8 fL (ref 80.0–100.0)
Platelets: 120 10*3/uL — ABNORMAL LOW (ref 150–400)
RBC: 3.53 MIL/uL — ABNORMAL LOW (ref 4.22–5.81)
RDW: 15.8 % — ABNORMAL HIGH (ref 11.5–15.5)
WBC: 6.2 10*3/uL (ref 4.0–10.5)
nRBC: 0 % (ref 0.0–0.2)

## 2021-01-14 LAB — GLUCOSE, CAPILLARY: Glucose-Capillary: 100 mg/dL — ABNORMAL HIGH (ref 70–99)

## 2021-01-14 MED ORDER — METOPROLOL TARTRATE 25 MG PO TABS
25.0000 mg | ORAL_TABLET | Freq: Two times a day (BID) | ORAL | Status: DC
  Administered 2021-01-14 – 2021-01-15 (×3): 25 mg via ORAL
  Filled 2021-01-14 (×3): qty 1

## 2021-01-14 MED ORDER — MAGIC MOUTHWASH
10.0000 mL | Freq: Four times a day (QID) | ORAL | Status: DC | PRN
  Administered 2021-01-14 – 2021-01-15 (×3): 10 mL via ORAL
  Filled 2021-01-14 (×4): qty 10

## 2021-01-14 MED ORDER — DIPHENHYDRAMINE HCL 25 MG PO CAPS
25.0000 mg | ORAL_CAPSULE | Freq: Four times a day (QID) | ORAL | Status: DC | PRN
  Administered 2021-01-14: 25 mg via ORAL
  Filled 2021-01-14: qty 1

## 2021-01-14 MED ORDER — FE FUMARATE-B12-VIT C-FA-IFC PO CAPS
1.0000 | ORAL_CAPSULE | Freq: Two times a day (BID) | ORAL | Status: DC
  Administered 2021-01-14 – 2021-01-15 (×3): 1 via ORAL
  Filled 2021-01-14 (×3): qty 1

## 2021-01-14 NOTE — Progress Notes (Signed)
Pt arrived to 4e from 2h. Pt oriented to room and staff. Vials obtained. PRN pain med given, per pt request. Wife at bedside.

## 2021-01-14 NOTE — Progress Notes (Signed)
CARDIAC REHAB PHASE I   PRE:  Rate/Rhythm: 82 SR  BP:  Supine:   Sitting: 97/57  Standing:    SaO2: 99RA  MODE:  Ambulation: 500 ft   POST:  Rate/Rhythm: 92 SR  BP:  Supine:    Sitting: 92/59  Standing:    SaO2: 97 RA  Pt tolerated exercise well and amb 573ft with standby assist and a front wheeled Karma Hiney. No rest breaks needed today. Pt had no c/o pain, CP or SOB. Education given to pt and wife in room on medication adherence, heart health diet, sternal precautions (move in the tube sheet) and restrictions. Reinforced IS usage, pt achieved 750 ml today. Home exercise given and will refer to CRP2 at [facility]. Pt left in recliner with wife in the room and call bell in reach   [place] ACSM-EP 01/14/2021 2:35 PM

## 2021-01-14 NOTE — Progress Notes (Signed)
Mobility Specialist: Progress Note   01/14/21 1656  Mobility  Activity Ambulated in hall  Level of Assistance Minimal assist, patient does 75% or more  Assistive Device Front wheel walker  Distance Ambulated (ft) 470 ft  Mobility Ambulated with assistance in hallway  Mobility Response Tolerated well  Mobility performed by Mobility specialist  Bed Position Chair  $Mobility charge 1 Mobility   Pre-Mobility: 88 HR, 100% SpO2 During Mobility: 96 HR Post-Mobility: 90 HR, 107/69 BP, 100% SpO2  Pt c/o 6/10 pain in his RLE and 7/10 pain in his chest during ambulation, otherwise asx. Pt back to chair after walk per request, RN notified.   [facility] [place] Mobility Specialist Mobility Specialist Phone: [phone]

## 2021-01-14 NOTE — Progress Notes (Signed)
3 Days Post-Op Procedure(s) (LRB): , MEDIASTINAL WASHOUT (N/A) Subjective: Only complaint is of sore throat.  Objective: Vital signs in last 24 hours: Temp:  [98.1 F (36.7 C)-98.8 F (37.1 C)] 98.6 F (37 C) (05/21 0700) Pulse Rate:  [83-103] 98 (05/21 0700) Cardiac Rhythm: Normal sinus rhythm (05/21 0400) Resp:  [11-27] 23 (05/21 0700) BP: (88-120)/(57-98) 120/76 (05/21 0700) SpO2:  [90 %-100 %] 97 % (05/21 0700) FiO2 (%):  [28 %] 28 % (05/20 0902) Weight:  [91.5 kg] 91.5 kg (05/21 0500)  Hemodynamic parameters for last 24 hours:    Intake/Output from previous day: 05/20 0701 - 05/21 0700 In: 1920 [P.O.:1920] Out: 1955 [Urine:1895; Chest Tube:60] Intake/Output this shift: No intake/output data recorded.  General appearance: alert and cooperative Neurologic: intact Heart: regular rate and rhythm Lungs: clear to auscultation bilaterally Extremities: extremities normal, atraumatic, no cyanosis or edema Wound: aquacell dressing ok oropharynx is clear with no lesions or erythema  Lab Results: Recent Labs    01/12/21 0444 01/14/21 0326  WBC 8.5 6.2  HGB 10.3* 10.2*  HCT 31.1* 30.3*  PLT 90* 120*   BMET:  Recent Labs    01/13/21 1014 01/14/21 0326  NA 131* 131*  K 3.8 4.0  CL 102 102  CO2 26 21*  GLUCOSE 100* 83  BUN 9 13  CREATININE 0.83 1.00  CALCIUM 8.4* 8.5*    PT/INR:  Recent Labs    01/11/21 0909  LABPROT 17.8*  INR 1.5*   ABG    Component Value Date/Time   PHART 7.401 01/11/2021 1106   HCO3 20.7 01/11/2021 1106   TCO2 22 01/11/2021 1106   ACIDBASEDEF 3.0 (H) 01/11/2021 1106   O2SAT 89.0 01/11/2021 1106   CBG (last 3)  Recent Labs    01/13/21 1627 01/13/21 2021 01/14/21 0619  GLUCAP 103* 102* 100*    Assessment/Plan:  POD 4 Hemodynamically stable in sinus rhythm. Continue Lopressor. On Norvasc for radial artery graft.  Wt is slightly above preop. Still on lasix. Follow up wt tomorrow.  Glucose under good control with normal  Hgb A1c. DC CBG's  Throat is probably irritated from TEE and ETT. Start Magic Mouthwash and on Cepacol lozenges. No sign of candida.  Awaiting bed on 4E. Continue mobilization. Anticipate home Sunday/Monday.  LOS: 4 days    Eugene Mccullough 01/14/2021

## 2021-01-14 NOTE — Progress Notes (Signed)
Pt reported swelling behind right knee. There is mild swelling and tenderness present on assessment. Pt ambulated in hallway with no issues reported. Pt instructed to continue to elevate legs while in chair.

## 2021-01-15 MED ORDER — AMLODIPINE BESYLATE 5 MG PO TABS: 5.0000 mg | ORAL_TABLET | Freq: Every day | ORAL | 2 refills | Status: DC

## 2021-01-15 MED ORDER — TRAMADOL HCL 50 MG PO TABS: 50.0000 mg | ORAL_TABLET | Freq: Four times a day (QID) | ORAL | 0 refills | Status: AC | PRN

## 2021-01-15 MED ORDER — METOPROLOL TARTRATE 25 MG PO TABS: 25.0000 mg | ORAL_TABLET | Freq: Two times a day (BID) | ORAL | 2 refills | Status: DC

## 2021-01-15 MED ORDER — ASPIRIN 325 MG PO TBEC: 325.0000 mg | DELAYED_RELEASE_TABLET | Freq: Every day | ORAL | 0 refills | Status: DC

## 2021-01-15 NOTE — Progress Notes (Signed)
       [address]       [place] [address]             [phone]      4 Days Post-Op Procedure(s) (LRB): , MEDIASTINAL WASHOUT (N/A)   Subjective: Complains of some soreness along the RLE EVH tunnel.  Otherwise feels good. He is independent with mobility, tolerating the cardiac diet, and having bowel movements.  Remains on RA.   Objective: Vital signs in last 24 hours: Temp:  [98.2 F (36.8 C)-99.1 F (37.3 C)] 98.2 F (36.8 C) (05/22 0747) Pulse Rate:  [71-92] 84 (05/22 0747) Cardiac Rhythm: Normal sinus rhythm (05/22 0748) Resp:  [17-23] 22 (05/22 0747) BP: (94-115)/(62-78) 115/75 (05/22 0747) SpO2:  [97 %-100 %] 100 % (05/22 0747) Weight:  [90 kg] 90 kg (05/22 0501)     Intake/Output from previous day: 05/21 0701 - 05/22 0700 In: 240 [P.O.:240] Out: -  Intake/Output this shift: No intake/output data recorded.  General appearance: alert and cooperative Neurologic: intact Heart: regular rate and rhythm, no arrhythmias on monitor review Lungs: clear to auscultation bilaterally Extremities: extremities normal, atraumatic, no cyanosis or edema Wound: The sternotomy incision is open to air and is intact and dry. There is some induration along the [facility] tunnel c/w hematoma. No erythema. The left radial harvest incision is intact and dry.   Lab Results: Recent Labs    01/14/21 0326  WBC 6.2  HGB 10.2*  HCT 30.3*  PLT 120*   BMET:  Recent Labs    01/13/21 1014 01/14/21 0326  NA 131* 131*  K 3.8 4.0  CL 102 102  CO2 26 21*  GLUCOSE 100* 83  BUN 9 13  CREATININE 0.83 1.00  CALCIUM 8.4* 8.5*    PT/INR:  No results for input(s): LABPROT, INR in the last 72 hours. ABG    Component Value Date/Time   PHART 7.401 01/11/2021 1106   HCO3 20.7 01/11/2021 1106   TCO2 22 01/11/2021 1106   ACIDBASEDEF 3.0 (H) 01/11/2021 1106   O2SAT 89.0 01/11/2021 1106   CBG (last 3)  Recent Labs    01/13/21 1627 01/13/21 2021 01/14/21 0619  GLUCAP  103* 102* 100*    Assessment/Plan:  -POD 4 CABG x 4, normal EF. Progressing well. Stable VS and cardiac rhythm. Doing well with mobility. He would like to return home today.   -Expected volume excess- Wt is now only 1kg+.   -Mild EBL anemia- no new lab today  -Hematoma along EVH tunnel- Mildly tender, no inflammation- Expect this will resolve without intervention.  -Disposition- plan for discharge today. Instructions given. Continue ASA, metoprolol, amlodipine(for radial graft), atorvastatin, and tamulosin.     LOS: 5 days    Antony Odea  [phone] 01/15/2021

## 2021-01-15 NOTE — Progress Notes (Signed)
Patient d/c from unit. Discharge instructions and education completed with patient and wife.

## 2021-01-16 ENCOUNTER — Other Ambulatory Visit: Payer: Self-pay | Admitting: Cardiology

## 2021-01-16 DIAGNOSIS — R072 Precordial pain: Secondary | ICD-10-CM

## 2021-01-26 ENCOUNTER — Other Ambulatory Visit: Payer: Self-pay

## 2021-01-26 ENCOUNTER — Encounter: Payer: Self-pay | Admitting: Cardiology

## 2021-01-26 ENCOUNTER — Ambulatory Visit: Payer: 59 | Admitting: Cardiology

## 2021-01-26 VITALS — BP 109/70 | HR 84 | Temp 98.7°F | Resp 16 | Ht 67.0 in | Wt 185.2 lb

## 2021-01-26 DIAGNOSIS — Z87891 Personal history of nicotine dependence: Secondary | ICD-10-CM

## 2021-01-26 DIAGNOSIS — Z951 Presence of aortocoronary bypass graft: Secondary | ICD-10-CM

## 2021-01-26 DIAGNOSIS — I700 Atherosclerosis of aorta: Secondary | ICD-10-CM

## 2021-01-26 DIAGNOSIS — I10 Essential (primary) hypertension: Secondary | ICD-10-CM

## 2021-01-26 DIAGNOSIS — E782 Mixed hyperlipidemia: Secondary | ICD-10-CM

## 2021-01-26 DIAGNOSIS — I2510 Atherosclerotic heart disease of native coronary artery without angina pectoris: Secondary | ICD-10-CM

## 2021-01-26 NOTE — Progress Notes (Signed)
Date:  12/15/2020   ID:  Eugene Mccullough, DOB [birth year], MRN [id number]  PCP:  Malena Peer, MD  Cardiologist:  Rex Kras, DO, [facility] (established care 11/25/2020) Former Cardiology Providers: Dr. Lyman Bishop   Date: 01/26/21 Last Office Visit: 01/05/2021  Chief Complaint  Patient presents with  . bypass surgery  . Follow-up    2 week    HPI  Eugene Mccullough is a 60 y.o. African-American male who presents to the office with a chief complaint of " status post bypass surgery." Patient's past medical history and cardiovascular risk factors include: Multivessel CAD status post four-vessel bypass surgery, HTN, HLD, BPH, mild coronary artery calcification, 26-pack-year history of smoking (quit in 2014), obesity due to excess calories.  He is referred to the office at the request of [name], [name], MD for evaluation of chest pain.  Patient presented to the office for evaluation of chest pain/heartburn symptoms which are very suggestive of CAD.  He had undergone a nuclear stress test in the past and therefore coronary CTA was recommended.  He was noted to have multivessel CAD which was followed up with left heart catheterization and subsequently saw cardiothoracic surgery for CABG evaluation.  Patient underwent four-vessel CABG on Jan 10, 2021 surgery went well; however, was noted to have high chest tube output.  His coagulopathy was corrected and was transfused 4 units of packed red blood cells.  Due to continued chest tube output he underwent mediastinal reexploration the following day.  The source of bleeding was noted and cauterized.  Patient did well thereafter and later discharged home on 01/15/2021.    Patient denies any anginal discomfort.  But continues to have some chest wall tenderness at the site of sternotomy, at the left radial harvesting site, and her right lower extremity.   No family history of premature coronary disease or sudden cardiac death.  FUNCTIONAL STATUS: No  structured exercise program but tries to keep himself active with washing cars, taking care of his yard, and doing work around the house.  ALLERGIES: Allergies  Allergen Reactions  . Hydrocodone-Acetaminophen Itching  . Oxycodone Itching    MEDICATION LIST PRIOR TO VISIT: Current Meds  Medication Sig  . amLODipine (NORVASC) 5 MG tablet Take 1 tablet (5 mg total) by mouth daily.  Marland Kitchen aspirin EC 325 MG EC tablet Take 1 tablet (325 mg total) by mouth daily.  Marland Kitchen atorvastatin (LIPITOR) 80 MG tablet Take 1 tablet (80 mg total) by mouth at bedtime.  . finasteride (PROSCAR) 5 MG tablet TAKE 1 TABLET BY MOUTH EVERY DAY  . metoprolol tartrate (LOPRESSOR) 25 MG tablet Take 1 tablet (25 mg total) by mouth 2 (two) times daily.  . nitroGLYCERIN (NITROSTAT) 0.4 MG SL tablet PLACE 1 TABLET (0.4 MG TOTAL) UNDER THE TONGUE EVERY 5 (FIVE) MINUTES AS NEEDED FOR CHEST PAIN. IF YOU REQUIRE MORE THAN TWO TABLETS FIVE MINUTES APART GO TO THE NEAREST ER VIA EMS.  . pantoprazole (PROTONIX) 40 MG tablet Take 40 mg by mouth 2 (two) times daily.  . tamsulosin (FLOMAX) 0.4 MG CAPS capsule Take 0.4 mg by mouth daily.     PAST MEDICAL HISTORY: Past Medical History:  Diagnosis Date  . Anemia   . Anginal pain ([place])   . Arthritis   . BPH (benign prostatic hyperplasia)   . Eczema   . Gastric ulcer   . GERD (gastroesophageal reflux disease)   . Gout   . H. pylori infection   . Hyperlipidemia   .  Hypertension   . IBS (irritable bowel syndrome)   . Pre-diabetes   . Sleep apnea    Does not use CPAP    PAST SURGICAL HISTORY: Past Surgical History:  Procedure Laterality Date  . CARDIAC CATHETERIZATION     2022  . CHOLECYSTECTOMY  2005  . COLONOSCOPY  08/2010  . CORONARY ARTERY BYPASS GRAFT N/A 01/10/2021   Procedure: CORONARY ARTERY BYPASS GRAFTING (CABG) X  FOUR ON PUMP USING LEFT INTERNAL MAMMARY ARTERY, LEFT RADIAL ARTERY, RIGHT ENDOSCOPIC GREATER SAPHENOUS VEIN HARVEST CONDUITS;  Surgeon: Lajuana Matte, MD;  Location: [place];  Service: Open Heart Surgery;  Laterality: N/A;  . EXPLORATION POST OPERATIVE OPEN HEART N/A 01/11/2021   Procedure: , MEDIASTINAL WASHOUT;  Surgeon: Lajuana Matte, MD;  Location: [place];  Service: Open Heart Surgery;  Laterality: N/A;  . LEFT HEART CATH AND CORONARY ANGIOGRAPHY N/A 12/21/2020   Procedure: LEFT HEART CATH AND CORONARY ANGIOGRAPHY;  Surgeon: Nigel Mormon, MD;  Location: [place] [facility];  Service: Cardiovascular;  Laterality: N/A;  . RADIAL ARTERY HARVEST Left 01/10/2021   Procedure: RADIAL ARTERY HARVEST;  Surgeon: Lajuana Matte, MD;  Location: [place];  Service: Open Heart Surgery;  Laterality: Left;  . TEE WITHOUT CARDIOVERSION N/A 01/10/2021   Procedure: TRANSESOPHAGEAL ECHOCARDIOGRAM (TEE);  Surgeon: Lajuana Matte, MD;  Location: [place];  Service: Open Heart Surgery;  Laterality: N/A;  . UPPER GI ENDOSCOPY  2019  . WISDOM TOOTH EXTRACTION      FAMILY HISTORY: The patient family history includes Diabetes in his brother and sister; Eating disorder in his daughter; Eczema in his son; Hyperlipidemia in his mother; Hypertension in his sister; Irritable bowel syndrome in his son; Obesity in his sister.  SOCIAL HISTORY:  The patient  reports that he has quit smoking. His smoking use included cigarettes. He has a 20.00 pack-year smoking history. He has never used smokeless tobacco. He reports that he does not drink alcohol and does not use drugs.  REVIEW OF SYSTEMS: Review of Systems  Constitutional: Negative for chills and fever.  HENT: Negative for hoarse voice and nosebleeds.   Eyes: Negative for discharge, double vision and pain.  Cardiovascular: Negative for chest pain, claudication, dyspnea on exertion, leg swelling, near-syncope, orthopnea, palpitations, paroxysmal nocturnal dyspnea and syncope.  Respiratory: Negative for hemoptysis and shortness of breath.   Musculoskeletal: Negative for muscle cramps and myalgias.   Gastrointestinal: Negative for abdominal pain, constipation, diarrhea, heartburn, hematemesis, hematochezia, melena, nausea and vomiting.  Neurological: Negative for dizziness and light-headedness.   PHYSICAL EXAM: Vitals with BMI 01/26/2021 01/15/2021 01/15/2021  Height _0  - -  Weight 185 lbs 3 oz - -  BMI 29 - -  Systolic [phone]  Diastolic 70 73 75  Pulse 84 89 84    CONSTITUTIONAL: Well-developed and well-nourished. No acute distress.  SKIN: Skin is warm and dry. No rash noted. No cyanosis. No pallor. No jaundice HEAD: Normocephalic and atraumatic.  EYES: No scleral icterus MOUTH/THROAT: Moist oral membranes.  NECK: No JVD present. No thyromegaly noted. No carotid bruits  LYMPHATIC: No visible cervical adenopathy.  CHEST Normal respiratory effort. No intercostal retractions.  Sternotomy site is healing well, incision site is clean dry and intact. LUNGS: Clear to auscultation bilaterally.  No stridor. No wheezes. No rales.  CARDIOVASCULAR: Regular, positive S1-S2, no murmurs rubs or gallops appreciated ABDOMINAL: No apparent ascites.  EXTREMITIES: No peripheral edema.  Left radial site healing well, clean dry and intact.  Right lower extremity harvesting site has some induration but no signs of infection. HEMATOLOGIC: No significant bruising NEUROLOGIC: Oriented to person, place, and time. Nonfocal. Normal muscle tone.  PSYCHIATRIC: Normal mood and affect. Normal behavior. Cooperative  CARDIAC DATABASE: Coronary artery bypass grafting surgery: Jan 10, 2021 by Dr. [name] LIMA-LAD, Left radial artery - OM2 (proximal of diagonal vein graft), RSVG D1, and D2.   EKG: 01/26/2021: NSR, 84bpm, without underlying injury pattern.   Echocardiogram: 12/13/2020: Normal LV systolic function with visual EF 50-55%. Left ventricle cavity is normal in size. Mild left ventricular hypertrophy. Normal global wall motion. Indeterminate diastolic filling pattern, normal LAP. Left atrial  cavity is severely dilated. Mild (Grade I) mitral regurgitation. Mild tricuspid regurgitation. No evidence of pulmonary hypertension. Mild pulmonic regurgitation. IVC is dilated with a respiratory response of >50%. No prior study for comparison.    Stress Testing: Nuclear stress test 05/01/2017:  The left ventricular ejection fraction is normal (55-65%).  Nuclear stress EF: 59%.  Blood pressure demonstrated a normal response to exercise.  There was no ST segment deviation noted during stress.  Defect 1: There is a small defect of moderate severity present in the basal inferior location.  This is a low risk study.   Low risk stress nuclear study with probable inferobasal thinning and no significant ischemia; EF 59 with normal wall motion.  Coronary CTA 12/14/2020: 1. Coronary calcium score of 24.6 AU. This was 70th percentile for age and sex matched control. 2. Normal coronary origin with right dominance. 3. CAD-RADS = 4. Severe stenosis (70-99%) at the proximal /mid LAD due to mixed plaque. First diagonal branch patent but diffuse disease within the ostial to proximal segment. Second diagonal branch is overall patent. Diffuse disease within the proximal to mid segment LCx. RCA without evidence of plaque or stenosis. 4. Study is sent for CT-FFR to further evaluate the LAD, D1, and LCx. Findings will be performed and reported separately.  Noncardiac impressions: 1. No acute findings in the imaged extracardiac chest. 2. Moderate hiatal hernia. 3. Mild hepatic steatosis. 4. Aortic Atherosclerosis (ICD10-I70.0).  Coronary CT FFR 12/15/2020: CT FFR analysis showed significant stenosis at mid LAD (modeled as total occlusion), mid-to-distal LCX, and acute marginal modeled as total occlusion.  Heart Catheterization: 12/21/2020: LM: Normal LAD: Mid 100% occlusion after Diag 2         Collaterals to distal LAD from Diag 2 and RPDA         High Diag 1 patent         Diag 2 diffuse  60-70% disease LCx: Proximal tandem 75% stenosis        Undefilled and sub-totally occluded OM1        Mid LCx 7% stenosis RCA: Mild luminal irregularities  Attempted wiring of LAD to assess chronicity of LAD occlusion. Based on wire behavior, appears to be chronic  Continue optimal medical management for now. Fortunately, he does not have any LM involvement and EF is normal. Any revascularization recommendation will be for symptomatic improvement.  In addition to optimal medical therapy, following are the options for revascularization  #1. CABG to distal LAD, ?diag2, OM1-if graftable #2. PCI to LCx +/- OM1, stage LAD CTO PCI  CABG more likely to achieve complete revascularization with less procedures. Will continue outpatient discussion re: optimal revascularization   LABORATORY DATA: CBC Latest Ref Rng & Units 01/14/2021 01/12/2021 01/11/2021  WBC 4.0 - 10.5 K/uL 6.2 8.5 7.5  Hemoglobin 13.0 - 17.0 g/dL 10.2(L) 10.3(L) 10.2(L)  Hematocrit 39.0 - 52.0 % 30.3(L) 31.1(L) 29.7(L)  Platelets 150 - 400 K/uL 120(L) 90(L) 90(L)    CMP Latest Ref Rng & Units 01/14/2021 01/13/2021 01/12/2021  Glucose 70 - 99 mg/dL 83 100(H) 107(H)  BUN 6 - 20 mg/dL _0 Creatinine 0.61 - 1.24 mg/dL 1.00 0.83 0.86  Sodium 135 - 145 mmol/L 131(L) 131(L) 135  Potassium 3.5 - 5.1 mmol/L 4.0 3.8 3.8  Chloride 98 - 111 mmol/L 102 102 107  CO2 22 - 32 mmol/L 21(L) 26 22  Calcium 8.9 - 10.3 mg/dL 8.5(L) 8.4(L) 8.4(L)  Total Protein 6.5 - 8.1 g/dL - - -  Total Bilirubin 0.3 - 1.2 mg/dL - - -  Alkaline Phos 38 - 126 U/L - - -  AST 15 - 41 U/L - - -  ALT 0 - 44 U/L - - -    Lipid Panel  Lab Results  Component Value Date   CHOL 113 12/19/2020   HDL 29 (L) 12/19/2020   LDLCALC 67 12/19/2020   LDLDIRECT 68 12/19/2020   TRIG 88 12/19/2020   CHOLHDL 4 07/02/2018    No components found for: NTPROBNP Recent Labs    12/19/20 1122  PROBNP 99   No results for input(s): TSH in the last 8760  hours.  BMP Recent Labs    01/12/21 0444 01/13/21 1014 01/14/21 0326  NA 135 131* 131*  K 3.8 3.8 4.0  CL 107 102 102  CO2 22 26 21*  GLUCOSE 107* 100* 83  BUN _1 CREATININE 0.86 0.83 1.00  CALCIUM 8.4* 8.4* 8.5*  GFRNONAA >60 >60 >60    HEMOGLOBIN A1C Lab Results  Component Value Date   HGBA1C 5.3 01/06/2021   MPG 105.41 01/06/2021   External Labs: Collected: 11/09/2020, received from PCP Hemoglobin 15.8 g/dL, hematocrit 48% Platelets 143 Creatinine 0.93 mg/dL. eGFR: 103 mL/min per 1.73 m Potassium 4.9 AST 23, ALT 40, alkaline phosphatase 108 Lipid profile: Total cholesterol 191, triglycerides 91, HDL 47, LDL 125, non-HDL 144 Hemoglobin A1c: 5.3 TSH: 1.07  IMPRESSION:    ICD-10-CM   1. Coronary artery disease involving native coronary artery of native heart without angina pectoris  I25.10 EKG 12-Lead  2. Hx of CABG (CABG X 4.  LIMA-LAD, Left radial artery - OM2 (proximal of diagonal vein graft), RSVG D1, and D2).  Z95.1   3. Atherosclerosis of aorta (H[facility])  I70.0   4. Essential hypertension  I10   5. Mixed hyperlipidemia  E78.2   6. Former smoker  Z87.891      RECOMMENDATIONS: Eugene Mccullough is a 60 y.o. 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  Atherosclerosis of the native coronary arteries without angina pectoris:  Status post four-vessel bypass 01/10/2021 and mediastinal exploration 01/11/2021.  Currently anginal free  Mild discomfort at the sternotomy site in the left radial harvesting site as well as right SVG harvesting site.  Medication reconciled.   Hospitalization records including discharge summary reviewed independently at today's office visit.  Currently on aspirin 325 mg p.o. daily.  Recommend  transitioning to aspirin 81 mg p.o. daily along with Plavix 75 mg p.o. daily.  However, given the fact that he had to be taken to the OR for reexploration on 01/11/2021 we will hold off on transitioning to dual antiplatelet therapy  until he is cleared by Dr. Abran Duke office.   Would benefit from cardiac rehab as well.  Educated on the importance of risk factor modifications.  Continue Norvasc for now secondary to left radial harvesting; however, will transition to losartan in the near future.  Coronary artery calcification: Continue aspirin and statin therapy  Benign essential hypertension:  Office blood pressures well controlled, home SBP ranges between 110-120 mmHg.  Medications reconciled.  Low-salt diet recommended.  Mixed hyperlipidemia:  Continue statin therapy.  Educated on importance of dietary restrictions.  Does not endorse any myalgias.  Former smoker: Educated on the importance of continued smoking cessation.  FINAL MEDICATION LIST END OF ENCOUNTER: No orders of the defined types were placed in this encounter.   Medications Discontinued During This Encounter  Medication Reason  . hydrOXYzine (ATARAX/VISTARIL) 25 MG tablet Error     Current Outpatient Medications:  .  amLODipine (NORVASC) 5 MG tablet, Take 1 tablet (5 mg total) by mouth daily., Disp: 30 tablet, Rfl: 2 .  aspirin EC 325 MG EC tablet, Take 1 tablet (325 mg total) by mouth daily., Disp: 30 tablet, Rfl: 0 .  atorvastatin (LIPITOR) 80 MG tablet, Take 1 tablet (80 mg total) by mouth at bedtime., Disp: 90 tablet, Rfl: 0 .  finasteride (PROSCAR) 5 MG tablet, TAKE 1 TABLET BY MOUTH EVERY DAY, Disp: 90 tablet, Rfl: 1 .  metoprolol tartrate (LOPRESSOR) 25 MG tablet, Take 1 tablet (25 mg total) by mouth 2 (two) times daily., Disp: 60 tablet, Rfl: 2 .  nitroGLYCERIN (NITROSTAT) 0.4 MG SL tablet, PLACE 1 TABLET (0.4 MG TOTAL) UNDER THE TONGUE EVERY 5 (FIVE) MINUTES AS NEEDED FOR CHEST PAIN. IF YOU REQUIRE  MORE THAN TWO TABLETS FIVE MINUTES APART GO TO THE NEAREST ER VIA EMS., Disp: 25 tablet, Rfl: 0 .  pantoprazole (PROTONIX) 40 MG tablet, Take 40 mg by mouth 2 (two) times daily., Disp: , Rfl:  .  tamsulosin (FLOMAX) 0.4 MG CAPS capsule, Take 0.4 mg by mouth daily., Disp: , Rfl:   Orders Placed This Encounter  Procedures  . EKG 12-Lead    There are no Patient Instructions on file for this visit.   --Continue cardiac medications as reconciled in final medication list. --Return in about 4 weeks (around 02/23/2021) for Follow up, CAD. Or sooner if needed. --Continue follow-up with your primary care physician regarding the management of your other chronic comorbid conditions.  Patient's questions and concerns were addressed to his satisfaction. He voices understanding of the instructions provided during this encounter.   This note was created using a voice recognition software as a result there may be grammatical errors inadvertently enclosed that do not reflect the nature of this encounter. Every attempt is made to correct such errors.  Rex Kras, [place], [facility]  Pager: [phone] Office: [phone]

## 2021-01-27 ENCOUNTER — Telehealth (INDEPENDENT_AMBULATORY_CARE_PROVIDER_SITE_OTHER): Payer: Self-pay | Admitting: Thoracic Surgery (Cardiothoracic Vascular Surgery)

## 2021-01-27 ENCOUNTER — Other Ambulatory Visit: Payer: Self-pay

## 2021-01-27 DIAGNOSIS — Z951 Presence of aortocoronary bypass graft: Secondary | ICD-10-CM

## 2021-01-27 NOTE — Progress Notes (Signed)
[address]       [place], [id number]             [phone]       Patient: Home Provider: Office Consent for Telemedicine visit obtained.  Today's visit was completed via a real-time telehealth (see specific modality noted below). The patient/authorized person provided oral consent at the time of the visit to engage in a telemedicine encounter with the present provider at [facility]. The patient/authorized person was informed of the potential benefits, limitations, and risks of telemedicine. The patient/authorized person expressed understanding that the laws that protect confidentiality also apply to telemedicine. The patient/authorized person acknowledged understanding that telemedicine does not provide emergency services and that he or she would need to call 911 or proceed to the nearest hospital for help if such a need arose.  . Total time spent in the clinical discussion 10 minutes. . Telehealth Modality: Phone visit (audio only)  I had a telephone visit with Eugene Mccullough.  Overall he is doing well.  He only complains of pain at the vein harvest site.  He is up and moving around.  He has met with his cardiologist and is cleared to undergo dual antiplatelet therapy.  I will see him back in 1 month with a chest x-ray.

## 2021-01-30 ENCOUNTER — Telehealth (HOSPITAL_COMMUNITY): Payer: Self-pay

## 2021-01-30 NOTE — Telephone Encounter (Signed)
Pt insurance is active and benefits verified through [place] 0, DED 0/0 met, out of pocket $1,500/0 met, co-insurance 20%. no pre-authorization required. Passport, 01/30/2021'@10' :22am, REF# [id number]  Will contact patient to see if he is interested in the Cardiac Rehab Program. If interested, patient will need to complete follow up appt. Once completed, patient will be contacted for scheduling upon review by the RN Navigator.

## 2021-02-06 ENCOUNTER — Other Ambulatory Visit: Payer: Self-pay | Admitting: Physician Assistant

## 2021-02-15 ENCOUNTER — Telehealth: Payer: Self-pay

## 2021-02-15 ENCOUNTER — Other Ambulatory Visit: Payer: Self-pay

## 2021-02-15 MED ORDER — CLOPIDOGREL BISULFATE 75 MG PO TABS: 75.0000 mg | ORAL_TABLET | Freq: Every day | ORAL | 3 refills | Status: DC

## 2021-02-15 MED ORDER — CLOPIDOGREL BISULFATE 75 MG PO TABS
75.0000 mg | ORAL_TABLET | Freq: Every day | ORAL | 3 refills | Status: DC
Start: 2021-02-15 — End: 2021-02-15

## 2021-02-15 MED ORDER — ASPIRIN EC 81 MG PO TBEC: 81.0000 mg | DELAYED_RELEASE_TABLET | Freq: Every day | ORAL | 3 refills | Status: DC

## 2021-02-15 NOTE — Telephone Encounter (Signed)
He should be taking ASA 81mg  po qday  Send in a script for Plavix 75mg  po qday Update MAR

## 2021-02-15 NOTE — Telephone Encounter (Signed)
Pt aware.

## 2021-02-15 NOTE — Telephone Encounter (Signed)
Pt called and stated that at his last office visit he was put on a blood thinner. I am not seeing this in the office not. He also stated that his aspirin 325 mg was lowered to aspirin 25 mg. He has not received these from his pharmacy, they said they never got the prescription.

## 2021-02-20 ENCOUNTER — Other Ambulatory Visit: Payer: Self-pay

## 2021-02-20 ENCOUNTER — Ambulatory Visit: Payer: 59 | Admitting: Cardiology

## 2021-02-20 ENCOUNTER — Other Ambulatory Visit: Payer: Self-pay | Admitting: Cardiology

## 2021-02-20 ENCOUNTER — Encounter: Payer: Self-pay | Admitting: Cardiology

## 2021-02-20 VITALS — BP 119/76 | HR 66 | Resp 16 | Ht 67.0 in | Wt 182.0 lb

## 2021-02-20 DIAGNOSIS — I10 Essential (primary) hypertension: Secondary | ICD-10-CM

## 2021-02-20 DIAGNOSIS — I2510 Atherosclerotic heart disease of native coronary artery without angina pectoris: Secondary | ICD-10-CM

## 2021-02-20 DIAGNOSIS — Z951 Presence of aortocoronary bypass graft: Secondary | ICD-10-CM

## 2021-02-20 DIAGNOSIS — I700 Atherosclerosis of aorta: Secondary | ICD-10-CM

## 2021-02-20 DIAGNOSIS — Z87891 Personal history of nicotine dependence: Secondary | ICD-10-CM

## 2021-02-20 DIAGNOSIS — R072 Precordial pain: Secondary | ICD-10-CM

## 2021-02-20 DIAGNOSIS — E782 Mixed hyperlipidemia: Secondary | ICD-10-CM

## 2021-02-20 MED ORDER — ATORVASTATIN CALCIUM 40 MG PO TABS: 40.0000 mg | ORAL_TABLET | Freq: Every day | ORAL | 0 refills | Status: DC

## 2021-02-20 NOTE — Progress Notes (Signed)
Date:  12/15/2020   ID:  Eugene Mccullough, DOB 1961-03-21, MRN [id number]  PCP:  Malena Peer, MD  Cardiologist:  Rex Kras, DO, [facility] (established care 11/25/2020) Former Cardiology Providers: Dr. Lyman Bishop   Date: 02/20/21 Last Office Visit: 01/26/2021  Chief Complaint  Patient presents with   Coronary Artery Disease   Follow-up    HPI  Eugene Mccullough is a 60 y.o. African-American male who presents to the office with a chief complaint of " CAD management." Patient's past medical history and cardiovascular risk factors include: Multivessel CAD status post four-vessel bypass surgery, HTN, HLD, BPH, mild coronary artery calcification, 26-pack-year history of smoking (quit in 2014), obesity due to excess calories.  He is referred to the office at the request of [name], [name], MD for evaluation of chest pain.  Initially presented with symptoms of chest pain/heartburn but symptoms are very suggestive of CAD.  He underwent an ischemic evaluation and was noted to have multivessel CAD on his coronary CTA.  He subsequently underwent left heart catheterization followed by four-vessel bypass with Dr. [name] on Jan 10, 2021.  After his four-vessel bypass he had high output via his chest tube and required 4 units of packed red blood cells.  He underwent mediastinal reexploration the following day and the source of bleeding was cauterized.  Since last office visit patient states that he is doing well from a cardiovascular standpoint.  He denies any chest pain or shortness of breath at rest or with effort related activities.  He continues to implement lifestyle modifications for secondary prevention.  He has started on aspirin and Plavix since last office visit and doing well.  He has an upcoming office visit with Dr. [name] as well.  No family history of premature coronary disease or sudden cardiac death.  FUNCTIONAL STATUS: No structured exercise program but tries to keep himself  active with washing cars, taking care of his yard, and doing work around the house.  ALLERGIES: Allergies  Allergen Reactions   Hydrocodone-Acetaminophen Itching   Oxycodone Itching    MEDICATION LIST PRIOR TO VISIT: Current Meds  Medication Sig   amLODipine (NORVASC) 5 MG tablet Take 1 tablet (5 mg total) by mouth daily.   aspirin EC 81 MG tablet Take 1 tablet (81 mg total) by mouth daily. Swallow whole.   clopidogrel (PLAVIX) 75 MG tablet Take 1 tablet (75 mg total) by mouth daily.   finasteride (PROSCAR) 5 MG tablet TAKE 1 TABLET BY MOUTH EVERY DAY   metoprolol tartrate (LOPRESSOR) 25 MG tablet Take 1 tablet (25 mg total) by mouth 2 (two) times daily.   nitroGLYCERIN (NITROSTAT) 0.4 MG SL tablet PLACE 1 TABLET (0.4 MG TOTAL) UNDER THE TONGUE EVERY 5 (FIVE) MINUTES AS NEEDED FOR CHEST PAIN. IF YOU REQUIRE MORE THAN TWO TABLETS FIVE MINUTES APART GO TO THE NEAREST ER VIA EMS.   pantoprazole (PROTONIX) 40 MG tablet Take 40 mg by mouth 2 (two) times daily.   tamsulosin (FLOMAX) 0.4 MG CAPS capsule Take 0.4 mg by mouth daily.   [DISCONTINUED] atorvastatin (LIPITOR) 80 MG tablet Take 1 tablet (80 mg total) by mouth at bedtime.     PAST MEDICAL HISTORY: Past Medical History:  Diagnosis Date   Anemia    Anginal pain (HCC)    Arthritis    BPH (benign prostatic hyperplasia)    Eczema    Gastric ulcer    GERD (gastroesophageal reflux disease)    Gout    H. pylori infection  Hyperlipidemia    Hypertension    IBS (irritable bowel syndrome)    Pre-diabetes    Sleep apnea    Does not use CPAP    PAST SURGICAL HISTORY: Past Surgical History:  Procedure Laterality Date   CARDIAC CATHETERIZATION     2022   CHOLECYSTECTOMY  2005   COLONOSCOPY  08/2010   CORONARY ARTERY BYPASS GRAFT N/A 01/10/2021   Procedure: CORONARY ARTERY BYPASS GRAFTING (CABG) X  FOUR ON PUMP USING LEFT INTERNAL MAMMARY ARTERY, LEFT RADIAL ARTERY, RIGHT ENDOSCOPIC GREATER SAPHENOUS VEIN HARVEST CONDUITS;   Surgeon: Lajuana Matte, MD;  Location: [facility];  Service: Open Heart Surgery;  Laterality: N/A;   EXPLORATION POST OPERATIVE OPEN HEART N/A 01/11/2021   Procedure: , MEDIASTINAL WASHOUT;  Surgeon: Lajuana Matte, MD;  Location: [facility];  Service: Open Heart Surgery;  Laterality: N/A;   LEFT HEART CATH AND CORONARY ANGIOGRAPHY N/A 12/21/2020   Procedure: LEFT HEART CATH AND CORONARY ANGIOGRAPHY;  Surgeon: Nigel Mormon, MD;  Location: [facility];  Service: Cardiovascular;  Laterality: N/A;   RADIAL ARTERY HARVEST Left 01/10/2021   Procedure: RADIAL ARTERY HARVEST;  Surgeon: Lajuana Matte, MD;  Location: [facility];  Service: Open Heart Surgery;  Laterality: Left;   TEE WITHOUT CARDIOVERSION N/A 01/10/2021   Procedure: TRANSESOPHAGEAL ECHOCARDIOGRAM (TEE);  Surgeon: Lajuana Matte, MD;  Location: [facility];  Service: Open Heart Surgery;  Laterality: N/A;   UPPER GI ENDOSCOPY  2019   WISDOM TOOTH EXTRACTION      FAMILY HISTORY: The patient family history includes Diabetes in his brother and sister; Eating disorder in his daughter; Eczema in his son; Hyperlipidemia in his mother; Hypertension in his sister; Irritable bowel syndrome in his son; Obesity in his sister.  SOCIAL HISTORY:  The patient  reports that he has quit smoking. His smoking use included cigarettes. He has a 20.00 pack-year smoking history. He has never used smokeless tobacco. He reports that he does not drink alcohol and does not use drugs.  REVIEW OF SYSTEMS: Review of Systems  Constitutional: Negative for chills and fever.  HENT:  Negative for hoarse voice and nosebleeds.   Eyes:  Negative for discharge, double vision and pain.  Cardiovascular:  Negative for chest pain, claudication, dyspnea on exertion, leg swelling, near-syncope, orthopnea, palpitations, paroxysmal nocturnal dyspnea and syncope.  Respiratory:  Negative for hemoptysis and shortness of breath.   Musculoskeletal:  Negative for muscle  cramps and myalgias.  Gastrointestinal:  Negative for abdominal pain, constipation, diarrhea, heartburn, hematemesis, hematochezia, melena, nausea and vomiting.  Neurological:  Negative for dizziness and light-headedness.   PHYSICAL EXAM: Vitals with BMI 02/20/2021 01/26/2021 01/15/2021  Height '5\' 7"'  '5\' 7"'  -  Weight 182 lbs 185 lbs 3 oz -  BMI 17.6 29 -  Systolic 160 737 106  Diastolic 76 70 73  Pulse 66 84 89    CONSTITUTIONAL: Well-developed and well-nourished. No acute distress.  SKIN: Skin is warm and dry. No rash noted. No cyanosis. No pallor. No jaundice HEAD: Normocephalic and atraumatic.  EYES: No scleral icterus MOUTH/THROAT: Moist oral membranes.  NECK: No JVD present. No thyromegaly noted. No carotid bruits  LYMPHATIC: No visible cervical adenopathy.  CHEST Normal respiratory effort. No intercostal retractions.  Sternotomy site is healing well, incision site is clean dry and intact. LUNGS: Clear to auscultation bilaterally.  No stridor. No wheezes. No rales.  CARDIOVASCULAR: Regular, positive S1-S2, no murmurs rubs or gallops appreciated ABDOMINAL: No apparent ascites.  EXTREMITIES:  No peripheral edema.  Left radial site healing well, clean dry and intact.  Right lower extremity harvesting site has some induration but no signs of infection. HEMATOLOGIC: No significant bruising NEUROLOGIC: Oriented to person, place, and time. Nonfocal. Normal muscle tone.  PSYCHIATRIC: Normal mood and affect. Normal behavior. Cooperative  CARDIAC DATABASE: Coronary artery bypass grafting surgery: Jan 10, 2021 by Dr. [name] LIMA-LAD, Left radial artery - OM2 (proximal of diagonal vein graft), RSVG D1, and D2.   EKG: 01/26/2021: NSR, 84bpm, without underlying injury pattern.   Echocardiogram: 12/13/2020: Normal LV systolic function with visual EF 50-55%. Left ventricle cavity is normal in size. Mild left ventricular hypertrophy. Normal global wall motion. Indeterminate diastolic filling  pattern, normal LAP. Left atrial cavity is severely dilated. Mild (Grade I) mitral regurgitation. Mild tricuspid regurgitation. No evidence of pulmonary hypertension. Mild pulmonic regurgitation. IVC is dilated with a respiratory response of >50%. No prior study for comparison.    Stress Testing: Nuclear stress test 05/01/2017: The left ventricular ejection fraction is normal (55-65%). Nuclear stress EF: 59%. Blood pressure demonstrated a normal response to exercise. There was no ST segment deviation noted during stress. Defect 1: There is a small defect of moderate severity present in the basal inferior location. This is a low risk study.   Low risk stress nuclear study with probable inferobasal thinning and no significant ischemia; EF 59 with normal wall motion.  Coronary CTA 12/14/2020: 1. Coronary calcium score of 24.6 AU. This was 70th percentile for age and sex matched control. 2. Normal coronary origin with right dominance. 3. CAD-RADS = 4. Severe stenosis (70-99%) at the proximal /mid LAD due to mixed plaque. First diagonal branch patent but diffuse disease within the ostial to proximal segment. Second diagonal branch is overall patent. Diffuse disease within the proximal to mid segment LCx. RCA without evidence of plaque or stenosis. 4. Study is sent for CT-FFR to further evaluate the LAD, D1, and LCx. Findings will be performed and reported separately.  Noncardiac impressions: 1. No acute findings in the imaged extracardiac chest. 2. Moderate hiatal hernia. 3. Mild hepatic steatosis. 4. Aortic Atherosclerosis (ICD10-I70.0).  Coronary CT FFR 12/15/2020: CT FFR analysis showed significant stenosis at mid LAD (modeled as total occlusion), mid-to-distal LCX, and acute marginal modeled as total occlusion.  Heart Catheterization: 12/21/2020: LM: Normal LAD: Mid 100% occlusion after Diag 2         Collaterals to distal LAD from Diag 2 and RPDA         High Diag 1 patent          Diag 2 diffuse 60-70% disease LCx: Proximal tandem 75% stenosis        Undefilled and sub-totally occluded OM1        Mid LCx 7% stenosis RCA: Mild luminal irregularities   Attempted wiring of LAD to assess chronicity of LAD occlusion. Based on wire behavior, appears to be chronic   Continue optimal medical management for now. Fortunately, he does not have any LM involvement and EF is normal. Any revascularization recommendation will be for symptomatic improvement.  In addition to optimal medical therapy, following are the options for revascularization   #1. CABG to distal LAD, ?diag2, OM1-if graftable #2. PCI to LCx +/- OM1, stage LAD CTO PCI   CABG more likely to achieve complete revascularization with less procedures. Will continue outpatient discussion re: optimal revascularization   LABORATORY DATA: CBC Latest Ref Rng & Units 01/14/2021 01/12/2021 01/11/2021  WBC 4.0 - 10.5 K/uL 6.2  8.5 7.5  Hemoglobin 13.0 - 17.0 g/dL 10.2(L) 10.3(L) 10.2(L)  Hematocrit 39.0 - 52.0 % 30.3(L) 31.1(L) 29.7(L)  Platelets 150 - 400 K/uL 120(L) 90(L) 90(L)    CMP Latest Ref Rng & Units 01/14/2021 01/13/2021 01/12/2021  Glucose 70 - 99 mg/dL 83 100(H) 107(H)  BUN 6 - 20 mg/dL '13 9 6  ' Creatinine 0.61 - 1.24 mg/dL 1.00 0.83 0.86  Sodium 135 - 145 mmol/L 131(L) 131(L) 135  Potassium 3.5 - 5.1 mmol/L 4.0 3.8 3.8  Chloride 98 - 111 mmol/L 102 102 107  CO2 22 - 32 mmol/L 21(L) 26 22  Calcium 8.9 - 10.3 mg/dL 8.5(L) 8.4(L) 8.4(L)  Total Protein 6.5 - 8.1 g/dL - - -  Total Bilirubin 0.3 - 1.2 mg/dL - - -  Alkaline Phos 38 - 126 U/L - - -  AST 15 - 41 U/L - - -  ALT 0 - 44 U/L - - -    Lipid Panel  Lab Results  Component Value Date   CHOL 113 12/19/2020   HDL 29 (L) 12/19/2020   LDLCALC 67 12/19/2020   LDLDIRECT 68 12/19/2020   TRIG 88 12/19/2020   CHOLHDL 4 07/02/2018    No components found for: NTPROBNP Recent Labs    12/19/20 1122  PROBNP 99   No results for input(s): TSH in the  last 8760 hours.  BMP Recent Labs    01/12/21 0444 01/13/21 1014 01/14/21 0326  NA 135 131* 131*  K 3.8 3.8 4.0  CL 107 102 102  CO2 22 26 21*  GLUCOSE 107* 100* 83  BUN '6 9 13  ' CREATININE 0.86 0.83 1.00  CALCIUM 8.4* 8.4* 8.5*  GFRNONAA >60 >60 >60    HEMOGLOBIN A1C Lab Results  Component Value Date   HGBA1C 5.3 01/06/2021   MPG 105.41 01/06/2021   External Labs: Collected: 11/09/2020, received from PCP Hemoglobin 15.8 g/dL, hematocrit 48% Platelets 143 Creatinine 0.93 mg/dL. eGFR: 103 mL/min per 1.73 m Potassium 4.9 AST 23, ALT 40, alkaline phosphatase 108 Lipid profile: Total cholesterol 191, triglycerides 91, HDL 47, LDL 125, non-HDL 144 Hemoglobin A1c: 5.3 TSH: 1.07  IMPRESSION:    ICD-10-CM   1. Coronary artery disease involving native coronary artery of native heart without angina pectoris  I25.10 atorvastatin (LIPITOR) 40 MG tablet    Lipid Panel With LDL/HDL Ratio    LDL cholesterol, direct    CMP14+EGFR    2. Hx of CABG (CABG X 4.  LIMA-LAD, Left radial artery - OM2 (proximal of diagonal vein graft), RSVG D1, and D2).  Z95.1 Lipid Panel With LDL/HDL Ratio    LDL cholesterol, direct    CMP14+EGFR    3. Atherosclerosis of aorta (HCC)  I70.0     4. Essential hypertension  I10     5. Mixed hyperlipidemia  E78.2 Lipid Panel With LDL/HDL Ratio    LDL cholesterol, direct    CMP14+EGFR    6. Former smoker  Z87.891        RECOMMENDATIONS: Eugene Mccullough is a 60 y.o. 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  Atherosclerosis of the native coronary arteries without angina pectoris: Status post four-vessel bypass 01/10/2021 and mediastinal exploration 01/11/2021. Chest pain-free.  No use of sublingual nitroglycerin tablets  since last office visit  Tolerating dual antiplatelet therapy well without  any side effects or intolerances.   Plans to start cardiac rehab in the upcoming weeks.   Has an upcoming follow-up visit with Dr. [name]. Patient requesting to wean off/stopping antiplatelet therapy and statins.  However after long discussion with the patient, and his wife at today's office visit the shared decision was to continue dual antiplatelet therapy and decreasing Lipitor to 40 mg p.o. nightly after checking a fasting lipid profile.  Coronary artery calcification: Continue aspirin and statin therapy  Benign essential hypertension: Office blood pressures well controlled, home SBP ranges between 110-120 mmHg. Medications reconciled. Low-salt diet recommended.  Mixed hyperlipidemia: Continue statin therapy. Educated on importance of dietary restrictions. Does not endorse any myalgias.  Former smoker: Educated on the importance of continued smoking cessation.  FINAL MEDICATION LIST END OF ENCOUNTER: Meds ordered this encounter  Medications   atorvastatin (LIPITOR) 40 MG tablet    Sig: Take 1 tablet (40 mg total) by mouth at bedtime.    Dispense:  180 tablet    Refill:  0    Medications Discontinued During This Encounter  Medication Reason   atorvastatin (LIPITOR) 80 MG tablet Reorder     Current Outpatient Medications:    amLODipine (NORVASC) 5 MG tablet, Take 1 tablet (5 mg total) by mouth daily., Disp: 30 tablet, Rfl: 2   aspirin EC 81 MG tablet, Take 1 tablet (81 mg total) by mouth daily. Swallow whole., Disp: 90 tablet, Rfl: 3   clopidogrel (PLAVIX) 75 MG tablet, Take 1 tablet (75 mg total) by mouth daily., Disp: 90 tablet, Rfl: 3   finasteride (PROSCAR) 5 MG tablet, TAKE 1 TABLET BY MOUTH EVERY DAY, Disp: 90 tablet, Rfl: 1   metoprolol tartrate (LOPRESSOR) 25 MG tablet, Take 1 tablet (25 mg total) by mouth 2 (two) times daily., Disp: 60 tablet, Rfl: 2   nitroGLYCERIN (NITROSTAT) 0.4 MG SL  tablet, PLACE 1 TABLET (0.4 MG TOTAL) UNDER THE TONGUE EVERY 5 (FIVE) MINUTES AS NEEDED FOR CHEST PAIN. IF YOU REQUIRE MORE THAN TWO TABLETS FIVE MINUTES APART GO TO THE NEAREST ER VIA EMS., Disp: 25 tablet, Rfl: 0   pantoprazole (PROTONIX) 40 MG tablet, Take 40 mg by mouth 2 (two) times daily., Disp: , Rfl:    tamsulosin (FLOMAX) 0.4 MG CAPS capsule, Take 0.4 mg by mouth daily., Disp: , Rfl:    atorvastatin (LIPITOR) 40 MG tablet, Take 1 tablet (40 mg total) by mouth at bedtime., Disp: 180 tablet, Rfl: 0  Orders Placed This Encounter  Procedures   Lipid Panel With LDL/HDL Ratio   LDL cholesterol, direct   CMP14+EGFR    There are no Patient Instructions on file for this visit.   --Continue cardiac medications as reconciled in final medication list. --Return in about 6 months (around 08/22/2021) for Follow up, CAD, CABG.. Or sooner if needed. --Continue follow-up with your primary care physician regarding the management of your other chronic comorbid conditions.  Patient's questions and concerns were addressed to his satisfaction. He voices understanding of the instructions provided during this encounter.   This note was created using a voice recognition software as a result there may be grammatical errors inadvertently enclosed that do not reflect the nature of this encounter. Every attempt is made to correct such errors.  Rex Kras, [place], [facility]  Pager: [phone] Office: [phone]

## 2021-02-23 LAB — CMP14+EGFR
ALT: 14 IU/L (ref 0–44)
AST: 15 IU/L (ref 0–40)
Albumin/Globulin Ratio: 1.2 (ref 1.2–2.2)
Albumin: 4.1 g/dL (ref 3.8–4.9)
Alkaline Phosphatase: 108 IU/L (ref 44–121)
BUN/Creatinine Ratio: 12 (ref 10–24)
BUN: 13 mg/dL (ref 8–27)
Bilirubin Total: 0.5 mg/dL (ref 0.0–1.2)
CO2: 23 mmol/L (ref 20–29)
Calcium: 10.1 mg/dL (ref 8.6–10.2)
Chloride: 102 mmol/L (ref 96–106)
Creatinine, Ser: 1.12 mg/dL (ref 0.76–1.27)
Globulin, Total: 3.3 g/dL (ref 1.5–4.5)
Glucose: 80 mg/dL (ref 65–99)
Potassium: 4.2 mmol/L (ref 3.5–5.2)
Sodium: 137 mmol/L (ref 134–144)
Total Protein: 7.4 g/dL (ref 6.0–8.5)
eGFR: 75 mL/min/{1.73_m2} (ref >59)

## 2021-02-23 LAB — LIPID PANEL WITH LDL/HDL RATIO
Cholesterol, Total: 97 mg/dL — ABNORMAL LOW (ref 100–199)
HDL: 31 mg/dL — ABNORMAL LOW (ref >39)
LDL Chol Calc (NIH): 47 mg/dL (ref 0–99)
LDL/HDL Ratio: 1.5 ratio (ref 0.0–3.6)
Triglycerides: 98 mg/dL (ref 0–149)
VLDL Cholesterol Cal: 19 mg/dL (ref 5–40)

## 2021-02-23 LAB — LDL CHOLESTEROL, DIRECT: LDL Direct: 52 mg/dL (ref 0–99)

## 2021-02-28 ENCOUNTER — Ambulatory Visit: Payer: 59 | Admitting: Cardiology

## 2021-02-28 NOTE — Progress Notes (Signed)
No answer left a vm to call back

## 2021-03-01 NOTE — Progress Notes (Signed)
NA, no Vmbox to leave a message

## 2021-03-02 ENCOUNTER — Other Ambulatory Visit: Payer: Self-pay | Admitting: Thoracic Surgery (Cardiothoracic Vascular Surgery)

## 2021-03-02 ENCOUNTER — Other Ambulatory Visit: Payer: Self-pay

## 2021-03-02 DIAGNOSIS — I2510 Atherosclerotic heart disease of native coronary artery without angina pectoris: Secondary | ICD-10-CM

## 2021-03-02 DIAGNOSIS — Z951 Presence of aortocoronary bypass graft: Secondary | ICD-10-CM

## 2021-03-02 MED ORDER — ATORVASTATIN CALCIUM 40 MG PO TABS: 40.0000 mg | ORAL_TABLET | Freq: Every day | ORAL | 0 refills | Status: DC

## 2021-03-02 NOTE — Progress Notes (Signed)
Spoke to patient he voiced understanding

## 2021-03-02 NOTE — Progress Notes (Signed)
Third attempt to contact pt, no answer. Left vm requesting call back.

## 2021-03-03 ENCOUNTER — Ambulatory Visit
Admission: RE | Admit: 2021-03-03 | Discharge: 2021-03-03 | Disposition: A | Discharge status: Home / Self Care | Payer: 59 | Source: Ambulatory Visit | Attending: Thoracic Surgery (Cardiothoracic Vascular Surgery) | Admitting: Thoracic Surgery (Cardiothoracic Vascular Surgery)

## 2021-03-03 ENCOUNTER — Other Ambulatory Visit: Payer: Self-pay

## 2021-03-03 ENCOUNTER — Ambulatory Visit (INDEPENDENT_AMBULATORY_CARE_PROVIDER_SITE_OTHER): Payer: Self-pay | Admitting: Thoracic Surgery (Cardiothoracic Vascular Surgery)

## 2021-03-03 VITALS — BP 126/79 | HR 86 | Resp 20 | Ht 67.0 in | Wt 182.0 lb

## 2021-03-03 DIAGNOSIS — Z951 Presence of aortocoronary bypass graft: Secondary | ICD-10-CM

## 2021-03-03 DIAGNOSIS — I2510 Atherosclerotic heart disease of native coronary artery without angina pectoris: Secondary | ICD-10-CM

## 2021-03-03 NOTE — Progress Notes (Signed)
      [address]       ,[place] [address]             [phone]        Yeshua Baillie Banks Medical Record #[id number] Date of Birth: 19-Feb-1961  Referring: Adrian Prows, MD Primary Care: Donnamarie Poag [name], MD Primary Cardiologist:None  Reason for visit:   follow-up  History of Present Illness:     60 year old male status post CABG presents for his 1 month follow-up appointment.  Overall he is doing well except he does complain of some jaw pain with ambulation.  He is concerned that this pain is similar to his preoperative chest pain.  He also complains of some pain in his his right lower extremity incision.  Physical Exam: BP 126/79   Pulse 86   Resp 20   Ht 5\' 7"  (1.702 m)   Wt 182 lb (82.6 kg)   SpO2 99% Comment: RA  BMI 28.51 kg/m   Alert NAD Incision clean.  Sternum stable Abdomen ND No peripheral edema.  There is a focal area of swelling underneath the incision.  There is no erythema or fluctuance.   Diagnostic Studies & Laboratory data: CXR: Clean     Assessment / Plan:   60 year old male status post coronary artery bypass grafting.  All of his incisions look good.  The vein access site does have a small area of swelling.  I recommended that he perform warm compresses and massage to it once daily.  I do not know what to make of his chest pain, but I will speak to his cardiologist.  He is cleared to start cardiac rehab Follow-up as needed.   Lajuana Matte 03/03/2021 5:24 PM

## 2021-03-11 ENCOUNTER — Other Ambulatory Visit: Payer: Self-pay | Admitting: Cardiology

## 2021-03-11 DIAGNOSIS — I25118 Atherosclerotic heart disease of native coronary artery with other forms of angina pectoris: Secondary | ICD-10-CM

## 2021-03-12 ENCOUNTER — Other Ambulatory Visit: Payer: Self-pay | Admitting: Cardiology

## 2021-03-12 DIAGNOSIS — I25118 Atherosclerotic heart disease of native coronary artery with other forms of angina pectoris: Secondary | ICD-10-CM

## 2021-03-15 ENCOUNTER — Telehealth (HOSPITAL_COMMUNITY): Payer: Self-pay

## 2021-03-15 ENCOUNTER — Other Ambulatory Visit: Payer: Self-pay | Admitting: Cardiology

## 2021-03-15 ENCOUNTER — Other Ambulatory Visit: Payer: Self-pay | Admitting: Physician Assistant

## 2021-03-15 DIAGNOSIS — R072 Precordial pain: Secondary | ICD-10-CM

## 2021-03-15 DIAGNOSIS — I10 Essential (primary) hypertension: Secondary | ICD-10-CM

## 2021-03-15 NOTE — Telephone Encounter (Signed)
Called patient to see if he was interested in participating in the Cardiac Rehab Program. Patient stated yes. Patient will come in for orientation on 04/13/2021@10 :00am and will attend the 1:30pm exercise class.   Tourist information centre manager.

## 2021-03-17 ENCOUNTER — Other Ambulatory Visit: Payer: Self-pay

## 2021-03-17 DIAGNOSIS — I2510 Atherosclerotic heart disease of native coronary artery without angina pectoris: Secondary | ICD-10-CM

## 2021-03-17 MED ORDER — AMLODIPINE BESYLATE 5 MG PO TABS: 5.0000 mg | ORAL_TABLET | Freq: Every day | ORAL | 1 refills | Status: DC

## 2021-03-17 MED ORDER — ATORVASTATIN CALCIUM 40 MG PO TABS: 40.0000 mg | ORAL_TABLET | Freq: Every day | ORAL | 1 refills | Status: DC

## 2021-03-27 ENCOUNTER — Telehealth: Payer: Self-pay

## 2021-03-27 DIAGNOSIS — Z006 Encounter for examination for normal comparison and control in clinical research program: Secondary | ICD-10-CM

## 2021-03-27 NOTE — Telephone Encounter (Signed)
I have attempted without success to contact this patient by phone for his Identify 90 day follow up phone call. I left a message for patient to return my phone call with my name and callback number. An e-mail was also sent to patient.   

## 2021-03-28 NOTE — Telephone Encounter (Signed)
Called patient to see if he was interested in participating in the Cardiac Rehab Program. Patient stated yes. Patient will come in for orientation on 03/30/2021'@1'$ :15pm and will attend the 1:30pm exercise class.

## 2021-03-29 ENCOUNTER — Encounter: Payer: Self-pay | Admitting: Cardiology

## 2021-03-29 ENCOUNTER — Ambulatory Visit: Payer: 59 | Admitting: Cardiology

## 2021-03-29 ENCOUNTER — Other Ambulatory Visit: Payer: Self-pay

## 2021-03-29 ENCOUNTER — Telehealth: Payer: Self-pay

## 2021-03-29 ENCOUNTER — Telehealth (HOSPITAL_COMMUNITY): Payer: Self-pay | Admitting: *Deleted

## 2021-03-29 VITALS — BP 123/77 | HR 64 | Temp 98.0°F | Resp 16 | Ht 67.0 in | Wt 181.0 lb

## 2021-03-29 DIAGNOSIS — E782 Mixed hyperlipidemia: Secondary | ICD-10-CM

## 2021-03-29 DIAGNOSIS — Z951 Presence of aortocoronary bypass graft: Secondary | ICD-10-CM

## 2021-03-29 DIAGNOSIS — Z87891 Personal history of nicotine dependence: Secondary | ICD-10-CM

## 2021-03-29 DIAGNOSIS — I10 Essential (primary) hypertension: Secondary | ICD-10-CM

## 2021-03-29 DIAGNOSIS — I700 Atherosclerosis of aorta: Secondary | ICD-10-CM

## 2021-03-29 DIAGNOSIS — I2510 Atherosclerotic heart disease of native coronary artery without angina pectoris: Secondary | ICD-10-CM

## 2021-03-29 NOTE — Telephone Encounter (Signed)
Called Eugene Mccullough to remind him of his CR orientation appointment and to complete his health history. Pt confirms that he is coming. Completed health history. As I spoke with pt he reports that he is having jaw and throat tightness with ambulation. He states that he was having this prior to his surgery along with some chest discomfort. The chest discomfort has gone away since the heart surgery but not the jaw and throat tightness. He describes it as a "4" on the pain scale and reports that he has spoke with his physicians about this. They told him he could start CR. We discussed Covid precautions, wearing mask, proper shoes, directions to the department and our contact number.He voices understanding. I have discussed this finding with Barnet Pall RN and she is contacting Dr. Terri Skains his cardiologist to confirm that pt is cleared to attend CR.

## 2021-03-29 NOTE — Telephone Encounter (Signed)
Okay have him come in today. Noon

## 2021-03-29 NOTE — Progress Notes (Signed)
ID:  Eugene Mccullough, DOB [birth year], MRN [id number]  PCP:  Malena Peer, MD  Cardiologist:  Rex Kras, DO, [facility] (established care 11/25/2020) Former Cardiology Providers: Dr. Lyman Bishop   Date: 03/29/21 Last Office Visit: 02/20/2021   Chief Complaint  Patient presents with   native heart without angina pectoris   Jaw Pain   Neck Pain    HPI  Eugene Mccullough is a 60 y.o. African-American male who presents to the office with a chief complaint of " reevaluation prior to starting cardiac rehab.." Patient's past medical history and cardiovascular risk factors include: Multivessel CAD status post four-vessel bypass surgery, HTN, HLD, BPH, mild coronary artery calcification, 26-pack-year history of smoking (quit in 2014), obesity due to excess calories.  He is referred to the office at the request of [name], [name], MD for evaluation of chest pain.  Initially presented with chest pain/heartburn which were suggestive of CAD.  He underwent an ischemic evaluation and was noted to have multivessel CAD on his coronary CTA.  He subsequently underwent left heart catheterization followed by four-vessel bypass with Dr. [name] on Jan 10, 2021.  After his four-vessel bypass his chest tube output was high and therefore he underwent mediastinal reexploration the following day and the source was cauterized but he required 4 units of PRBC postoperatively.  Since then he has been doing well from a cardiovascular standpoint.  He has implemented lifestyle changes his physical activity and has facilitated approximately 20 pounds of weight loss.  However, he has been having some upper throat and jaw pain but the intensity, frequency and duration is significantly better when compared to prior to his CABG.  He starts cardiac rehab tomorrow and he had voiced least symptoms to them and he was asked to be referred to cardiology for further evaluation prior to starting rehab.  He does not have any chest  pain or shortness of breath today at the time of the evaluation.  And he states that his symptoms of throat pain and jaw pain are very intermittent.  He is compliant with dual antiplatelet therapy.  FUNCTIONAL STATUS: No structured exercise program but tries to keep himself active with washing cars, taking care of his yard, and doing work around the house.  ALLERGIES: Allergies  Allergen Reactions   Hydrocodone-Acetaminophen Itching   Oxycodone Itching    MEDICATION LIST PRIOR TO VISIT: Current Meds  Medication Sig   amLODipine (NORVASC) 5 MG tablet Take 1 tablet (5 mg total) by mouth daily.   aspirin EC 81 MG tablet Take 1 tablet (81 mg total) by mouth daily. Swallow whole.   atorvastatin (LIPITOR) 40 MG tablet Take 1 tablet (40 mg total) by mouth at bedtime.   clopidogrel (PLAVIX) 75 MG tablet Take 1 tablet (75 mg total) by mouth daily.   finasteride (PROSCAR) 5 MG tablet TAKE 1 TABLET BY MOUTH EVERY DAY   metoprolol tartrate (LOPRESSOR) 25 MG tablet Take 1 tablet (25 mg total) by mouth 2 (two) times daily.   nitroGLYCERIN (NITROSTAT) 0.4 MG SL tablet PLACE 1 TABLET (0.4 MG TOTAL) UNDER THE TONGUE EVERY 5 (FIVE) MINUTES AS NEEDED FOR CHEST PAIN. IF YOU REQUIRE MORE THAN TWO TABLETS FIVE MINUTES APART GO TO THE NEAREST ER VIA EMS.   pantoprazole (PROTONIX) 40 MG tablet Take 40 mg by mouth 2 (two) times daily.   tamsulosin (FLOMAX) 0.4 MG CAPS capsule Take 0.4 mg by mouth daily.     PAST MEDICAL HISTORY: Past Medical History:  Diagnosis Date  Anemia    Anginal pain (HCC)    Arthritis    BPH (benign prostatic hyperplasia)    Eczema    Gastric ulcer    GERD (gastroesophageal reflux disease)    Gout    H. pylori infection    Hyperlipidemia    Hypertension    IBS (irritable bowel syndrome)    Pre-diabetes    Sleep apnea    Does not use CPAP    PAST SURGICAL HISTORY: Past Surgical History:  Procedure Laterality Date   CARDIAC CATHETERIZATION     2022   CHOLECYSTECTOMY   2005   COLONOSCOPY  08/2010   CORONARY ARTERY BYPASS GRAFT N/A 01/10/2021   Procedure: CORONARY ARTERY BYPASS GRAFTING (CABG) X  FOUR ON PUMP USING LEFT INTERNAL MAMMARY ARTERY, LEFT RADIAL ARTERY, RIGHT ENDOSCOPIC GREATER SAPHENOUS VEIN HARVEST CONDUITS;  Surgeon: Lajuana Matte, MD;  Location: [place];  Service: Open Heart Surgery;  Laterality: N/A;   EXPLORATION POST OPERATIVE OPEN HEART N/A 01/11/2021   Procedure: , MEDIASTINAL WASHOUT;  Surgeon: Lajuana Matte, MD;  Location: [place];  Service: Open Heart Surgery;  Laterality: N/A;   LEFT HEART CATH AND CORONARY ANGIOGRAPHY N/A 12/21/2020   Procedure: LEFT HEART CATH AND CORONARY ANGIOGRAPHY;  Surgeon: Nigel Mormon, MD;  Location: [place] [facility];  Service: Cardiovascular;  Laterality: N/A;   RADIAL ARTERY HARVEST Left 01/10/2021   Procedure: RADIAL ARTERY HARVEST;  Surgeon: Lajuana Matte, MD;  Location: [place];  Service: Open Heart Surgery;  Laterality: Left;   TEE WITHOUT CARDIOVERSION N/A 01/10/2021   Procedure: TRANSESOPHAGEAL ECHOCARDIOGRAM (TEE);  Surgeon: Lajuana Matte, MD;  Location: [place];  Service: Open Heart Surgery;  Laterality: N/A;   UPPER GI ENDOSCOPY  2019   WISDOM TOOTH EXTRACTION      FAMILY HISTORY: The patient family history includes Diabetes in his brother and sister; Eating disorder in his daughter; Eczema in his son; Hyperlipidemia in his mother; Hypertension in his sister; Irritable bowel syndrome in his son; Obesity in his sister.  SOCIAL HISTORY:  The patient  reports that he has quit smoking. His smoking use included cigarettes. He has a 20.00 pack-year smoking history. He has never used smokeless tobacco. He reports that he does not drink alcohol and does not use drugs.  REVIEW OF SYSTEMS: Review of Systems  Constitutional: Negative for chills and fever.  HENT:  Negative for hoarse voice and nosebleeds.   Eyes:  Negative for discharge, double vision and pain.  Cardiovascular:   Negative for chest pain, claudication, dyspnea on exertion, leg swelling, near-syncope, orthopnea, palpitations, paroxysmal nocturnal dyspnea and syncope.  Respiratory:  Negative for hemoptysis and shortness of breath.   Musculoskeletal:  Negative for muscle cramps and myalgias.  Gastrointestinal:  Negative for abdominal pain, constipation, diarrhea, heartburn, hematemesis, hematochezia, melena, nausea and vomiting.  Neurological:  Negative for dizziness and light-headedness.   PHYSICAL EXAM: Vitals with BMI 03/29/2021 03/03/2021 02/20/2021  Height '5\' 7"'  '5\' 7"'  '5\' 7"'   Weight 181 lbs 182 lbs 182 lbs  BMI 28.34 82.9 56.2  Systolic 130 865 784  Diastolic 77 79 76  Pulse 64 86 66    CONSTITUTIONAL: Well-developed and well-nourished. No acute distress.  SKIN: Skin is warm and dry. No rash noted. No cyanosis. No pallor. No jaundice HEAD: Normocephalic and atraumatic.  EYES: No scleral icterus MOUTH/THROAT: Moist oral membranes.  NECK: No JVD present. No thyromegaly noted. No carotid bruits  LYMPHATIC: No visible cervical adenopathy.  CHEST Normal  respiratory effort. No intercostal retractions.  Sternotomy site is healing well, incision site is clean dry and intact. LUNGS: Clear to auscultation bilaterally.  No stridor. No wheezes. No rales.  CARDIOVASCULAR: Regular, positive S1-S2, no murmurs rubs or gallops appreciated ABDOMINAL: No apparent ascites.  EXTREMITIES: No peripheral edema.  Left radial site healing well, clean dry and intact.  Right lower extremity harvesting site has some induration but no signs of infection. HEMATOLOGIC: No significant bruising NEUROLOGIC: Oriented to person, place, and time. Nonfocal. Normal muscle tone.  PSYCHIATRIC: Normal mood and affect. Normal behavior. Cooperative  CARDIAC DATABASE: Coronary artery bypass grafting surgery: Jan 10, 2021 by Dr. [name] LIMA-LAD, Left radial artery - OM2 (proximal of diagonal vein graft), RSVG D1, and D2.    EKG: 03/29/2021: Normal sinus rhythm, 63 bpm, low voltage in the limb leads, without underlying ischemia or injury pattern.  Echocardiogram: 12/13/2020: Normal LV systolic function with visual EF 50-55%. Left ventricle cavity is normal in size. Mild left ventricular hypertrophy. Normal global wall motion. Indeterminate diastolic filling pattern, normal LAP. Left atrial cavity is severely dilated. Mild (Grade I) mitral regurgitation. Mild tricuspid regurgitation. No evidence of pulmonary hypertension. Mild pulmonic regurgitation. IVC is dilated with a respiratory response of >50%. No prior study for comparison.    Stress Testing: Nuclear stress test 05/01/2017: The left ventricular ejection fraction is normal (55-65%). Nuclear stress EF: 59%. Blood pressure demonstrated a normal response to exercise. There was no ST segment deviation noted during stress. Defect 1: There is a small defect of moderate severity present in the basal inferior location. This is a low risk study.   Low risk stress nuclear study with probable inferobasal thinning and no significant ischemia; EF 59 with normal wall motion.  Coronary CTA 12/14/2020: 1. Coronary calcium score of 24.6 AU. This was 70th percentile for age and sex matched control. 2. Normal coronary origin with right dominance. 3. CAD-RADS = 4. Severe stenosis (70-99%) at the proximal /mid LAD due to mixed plaque. First diagonal branch patent but diffuse disease within the ostial to proximal segment. Second diagonal branch is overall patent. Diffuse disease within the proximal to mid segment LCx. RCA without evidence of plaque or stenosis. 4. Study is sent for CT-FFR to further evaluate the LAD, D1, and LCx. Findings will be performed and reported separately.  Noncardiac impressions: 1. No acute findings in the imaged extracardiac chest. 2. Moderate hiatal hernia. 3. Mild hepatic steatosis. 4. Aortic Atherosclerosis (ICD10-I70.0).  Coronary  CT FFR 12/15/2020: CT FFR analysis showed significant stenosis at mid LAD (modeled as total occlusion), mid-to-distal LCX, and acute marginal modeled as total occlusion.  Heart Catheterization: 12/21/2020: LM: Normal LAD: Mid 100% occlusion after Diag 2         Collaterals to distal LAD from Diag 2 and RPDA         High Diag 1 patent         Diag 2 diffuse 60-70% disease LCx: Proximal tandem 75% stenosis        Undefilled and sub-totally occluded OM1        Mid LCx 7% stenosis RCA: Mild luminal irregularities   Attempted wiring of LAD to assess chronicity of LAD occlusion. Based on wire behavior, appears to be chronic   Continue optimal medical management for now. Fortunately, he does not have any LM involvement and EF is normal. Any revascularization recommendation will be for symptomatic improvement.  In addition to optimal medical therapy, following are the options for revascularization   #  1. CABG to distal LAD, ?diag2, OM1-if graftable #2. PCI to LCx +/- OM1, stage LAD CTO PCI   CABG more likely to achieve complete revascularization with less procedures. Will continue outpatient discussion re: optimal revascularization   LABORATORY DATA: CBC Latest Ref Rng & Units 01/14/2021 01/12/2021 01/11/2021  WBC 4.0 - 10.5 K/uL 6.2 8.5 7.5  Hemoglobin 13.0 - 17.0 g/dL 10.2(L) 10.3(L) 10.2(L)  Hematocrit 39.0 - 52.0 % 30.3(L) 31.1(L) 29.7(L)  Platelets 150 - 400 K/uL 120(L) 90(L) 90(L)    CMP Latest Ref Rng & Units 02/21/2021 01/14/2021 01/13/2021  Glucose 65 - 99 mg/dL 80 83 100(H)  BUN 8 - 27 mg/dL '13 13 9  ' Creatinine 0.76 - 1.27 mg/dL 1.12 1.00 0.83  Sodium 134 - 144 mmol/L 137 131(L) 131(L)  Potassium 3.5 - 5.2 mmol/L 4.2 4.0 3.8  Chloride 96 - 106 mmol/L 102 102 102  CO2 20 - 29 mmol/L 23 21(L) 26  Calcium 8.6 - 10.2 mg/dL 10.1 8.5(L) 8.4(L)  Total Protein 6.0 - 8.5 g/dL 7.4 - -  Total Bilirubin 0.0 - 1.2 mg/dL 0.5 - -  Alkaline Phos 44 - 121 IU/L 108 - -  AST 0 - 40 IU/L 15 - -   ALT 0 - 44 IU/L 14 - -    Lipid Panel  Lab Results  Component Value Date   CHOL 97 (L) 02/21/2021   HDL 31 (L) 02/21/2021   LDLCALC 47 02/21/2021   LDLDIRECT 52 02/21/2021   TRIG 98 02/21/2021   CHOLHDL 4 07/02/2018    No components found for: NTPROBNP Recent Labs    12/19/20 1122  PROBNP 99   No results for input(s): TSH in the last 8760 hours.  BMP Recent Labs    01/12/21 0444 01/13/21 1014 01/14/21 0326 02/21/21 0900  NA 135 131* 131* 137  K 3.8 3.8 4.0 4.2  CL 107 102 102 102  CO2 22 26 21* 23  GLUCOSE 107* 100* 83 80  BUN '6 9 13 13  ' CREATININE 0.86 0.83 1.00 1.12  CALCIUM 8.4* 8.4* 8.5* 10.1  GFRNONAA >60 >60 >60  --     HEMOGLOBIN A1C Lab Results  Component Value Date   HGBA1C 5.3 01/06/2021   MPG 105.41 01/06/2021   External Labs: Collected: 11/09/2020, received from PCP Hemoglobin 15.8 g/dL, hematocrit 48% Platelets 143 Creatinine 0.93 mg/dL. eGFR: 103 mL/min per 1.73 m Potassium 4.9 AST 23, ALT 40, alkaline phosphatase 108 Lipid profile: Total cholesterol 191, triglycerides 91, HDL 47, LDL 125, non-HDL 144 Hemoglobin A1c: 5.3 TSH: 1.07  IMPRESSION:    ICD-10-CM   1. Coronary artery disease involving native coronary artery of native heart without angina pectoris  I25.10 EKG 12-Lead    2. Hx of CABG (CABG X 4.  LIMA-LAD, Left radial artery - OM2 (proximal of diagonal vein graft), RSVG D1, and D2).  Z95.1     3. Atherosclerosis of aorta (HCC)  I70.0     4. Essential hypertension  I10     5. Mixed hyperlipidemia  E78.2     6. Former smoker  Z87.891        RECOMMENDATIONS: Eugene Mccullough is a 60 y.o. 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  Atherosclerosis of the native coronary arteries without  angina pectoris: Status post four-vessel bypass 01/10/2021 and mediastinal  exploration 01/11/2021. Chest pain-free.  No use of sublingual nitroglycerin tablets since last office visit  Tolerating dual antiplatelet therapy well without any side effects or intolerances.   Patient plans to start cardiac rehab tomorrow 03/30/2021.  When he discussed with the cardiac rehab staff this morning he had mentioned that he does experience jaw pain and neck pain at times.  He was recommended to follow-up with cardiology prior to initiating cardiac rehab tomorrow. Patient was added on to the office schedule for reevaluation.   EKG shows normal sinus rhythm without underlying ischemia. Patient states that his symptoms of jaw and neck pain are significantly better when compared to before his CABG surgery.  He is walking on a daily basis and continues to improve his lifestyle this has facilitated at least 20 pounds of weight loss since his CABG surgery. The goal of the cardiac rehab is to improve his strength and endurance. We discussed undergoing stress test to reevaluate his coronaries; however, the shared decision was to hold off on this at the current time as his symptoms though present are significantly better than before and ECG is non-ischemic.  I will have the office convey the recommendations to cardiac rehab.  He can safely start exercising under supervision and if he were to have any symptoms or arrhythmia will arrange exercise nuclear stress test to evaluate for reversible ischemia.  Patient is agreeable with the plan of care.  Coronary artery calcification: Continue aspirin and statin therapy  Benign essential hypertension: Office blood pressures well controlled, home SBP ranges between 110-120 mmHg. Medications reconciled. Low-salt diet recommended.  Mixed hyperlipidemia: Continue statin therapy. Educated on importance of dietary restrictions. Does not endorse any myalgias.  Former smoker: Educated  on the importance of continued smoking cessation.  FINAL MEDICATION LIST END OF ENCOUNTER: No orders of the defined types were placed in this encounter.    Current Outpatient Medications:    amLODipine (NORVASC) 5 MG tablet, Take 1 tablet (5 mg total) by mouth daily., Disp: 90 tablet, Rfl: 1   aspirin EC 81 MG tablet, Take 1 tablet (81 mg total) by mouth daily. Swallow whole., Disp: 90 tablet, Rfl: 3   atorvastatin (LIPITOR) 40 MG tablet, Take 1 tablet (40 mg total) by mouth at bedtime., Disp: 90 tablet, Rfl: 1   clopidogrel (PLAVIX) 75 MG tablet, Take 1 tablet (75 mg total) by mouth daily., Disp: 90 tablet, Rfl: 3   finasteride (PROSCAR) 5 MG tablet, TAKE 1 TABLET BY MOUTH EVERY DAY, Disp: 90 tablet, Rfl: 1   metoprolol tartrate (LOPRESSOR) 25 MG tablet, Take 1 tablet (25 mg total) by mouth 2 (two) times daily., Disp: 60 tablet, Rfl: 2   nitroGLYCERIN (NITROSTAT) 0.4 MG SL tablet, PLACE 1 TABLET (0.4 MG TOTAL) UNDER THE TONGUE EVERY 5 (FIVE) MINUTES AS NEEDED FOR CHEST PAIN. IF YOU REQUIRE MORE THAN TWO TABLETS FIVE MINUTES APART GO TO THE NEAREST ER VIA EMS., Disp: 25 tablet, Rfl: 0   pantoprazole (PROTONIX) 40 MG tablet, Take 40 mg by mouth 2 (two) times daily., Disp: , Rfl:    tamsulosin (FLOMAX) 0.4 MG CAPS capsule, Take 0.4 mg by mouth daily., Disp: , Rfl:   Orders Placed This Encounter  Procedures   EKG 12-Lead    There are no Patient Instructions on file for this visit.   --Continue cardiac medications as reconciled in final medication list. --Return in about 4 weeks (around 04/26/2021) for Follow up, CAD. Or sooner if needed. --Continue follow-up with your primary care  physician regarding the management of your other chronic comorbid conditions.  Patient's questions and concerns were addressed to his satisfaction. He voices understanding of the instructions provided during this encounter.   This note was created using a voice recognition software as a result there may be  grammatical errors inadvertently enclosed that do not reflect the nature of this encounter. Every attempt is made to correct such errors.  Rex Kras, [place], [facility]  Pager: [phone] Office: [phone]

## 2021-03-29 NOTE — Telephone Encounter (Signed)
Done

## 2021-03-29 NOTE — Telephone Encounter (Signed)
Cardiac rehab called and said that pt complains of neck and jaw pain with walking and they want him to be seen before he starts cardiac rehab tomorrow

## 2021-03-30 ENCOUNTER — Encounter (HOSPITAL_COMMUNITY)
Admission: RE | Admit: 2021-03-30 | Discharge: 2021-03-30 | Disposition: A | Discharge status: Home / Self Care | Payer: 59 | Source: Ambulatory Visit | Attending: Cardiology | Admitting: Cardiology

## 2021-03-30 VITALS — BP 110/70 | HR 69 | Ht 66.5 in | Wt 178.4 lb

## 2021-03-30 DIAGNOSIS — Z48812 Encounter for surgical aftercare following surgery on the circulatory system: Secondary | ICD-10-CM | POA: Insufficient documentation

## 2021-03-30 DIAGNOSIS — Z951 Presence of aortocoronary bypass graft: Secondary | ICD-10-CM | POA: Insufficient documentation

## 2021-03-30 HISTORY — DX: Atherosclerotic heart disease of native coronary artery without angina pectoris: I25.10

## 2021-03-30 NOTE — Progress Notes (Signed)
Cardiac Individual Treatment Plan  Patient Details  Name: Eugene Mccullough MRN: [id number] Date of Birth: 1961/03/26 Referring Provider:   Flowsheet Row CARDIAC REHAB PHASE II ORIENTATION from 03/30/2021 in [facility]  Referring Provider Rex Kras, MD       Initial Encounter Date:  [place] PHASE II ORIENTATION from 03/30/2021 in [facility]  Date 03/30/21       Visit Diagnosis:  01/10/21 S/P CABG x 4  Patient's Home Medications on Admission:  Current Outpatient Medications:    amLODipine (NORVASC) 5 MG tablet, Take 1 tablet (5 mg total) by mouth daily., Disp: 90 tablet, Rfl: 1   aspirin EC 81 MG tablet, Take 1 tablet (81 mg total) by mouth daily. Swallow whole., Disp: 90 tablet, Rfl: 3   atorvastatin (LIPITOR) 40 MG tablet, Take 1 tablet (40 mg total) by mouth at bedtime., Disp: 90 tablet, Rfl: 1   clopidogrel (PLAVIX) 75 MG tablet, Take 1 tablet (75 mg total) by mouth daily., Disp: 90 tablet, Rfl: 3   finasteride (PROSCAR) 5 MG tablet, TAKE 1 TABLET BY MOUTH EVERY DAY, Disp: 90 tablet, Rfl: 1   metoprolol tartrate (LOPRESSOR) 25 MG tablet, Take 1 tablet (25 mg total) by mouth 2 (two) times daily., Disp: 60 tablet, Rfl: 2   nitroGLYCERIN (NITROSTAT) 0.4 MG SL tablet, PLACE 1 TABLET (0.4 MG TOTAL) UNDER THE TONGUE EVERY 5 (FIVE) MINUTES AS NEEDED FOR CHEST PAIN. IF YOU REQUIRE MORE THAN TWO TABLETS FIVE MINUTES APART GO TO THE NEAREST ER VIA EMS., Disp: 25 tablet, Rfl: 0   pantoprazole (PROTONIX) 40 MG tablet, Take 40 mg by mouth 2 (two) times daily., Disp: , Rfl:    tamsulosin (FLOMAX) 0.4 MG CAPS capsule, Take 0.4 mg by mouth daily., Disp: , Rfl:   Past Medical History: Past Medical History:  Diagnosis Date   Anemia    Anginal pain (HCC)    Arthritis    BPH (benign prostatic hyperplasia)    Coronary artery disease    Eczema    Gastric ulcer    GERD (gastroesophageal reflux disease)    Gout     H. pylori infection    Hyperlipidemia    Hypertension    IBS (irritable bowel syndrome)    Pre-diabetes    Sleep apnea    Does not use CPAP    Tobacco Use: Social History   Tobacco Use  Smoking Status Former   Packs/day: 1.00   Years: 20.00   Pack years: 20.00   Types: Cigarettes  Smokeless Tobacco Never    Labs: Recent Review Flowsheet Data     Labs for ITP Cardiac and Pulmonary Rehab Latest Ref Rng & Units 01/11/2021 01/11/2021 01/11/2021 01/11/2021 02/21/2021   Cholestrol 100 - 199 mg/dL - - - - 97(L)   LDLCALC 0 - 99 mg/dL - - - - 47   LDLDIRECT 0 - 99 mg/dL - - - - 52   HDL >39 mg/dL - - - - 31(L)   Trlycerides 0 - 149 mg/dL - - - - 98   Hemoglobin A1c 4.8 - 5.6 % - - - - -   PHART 7.350 - 7.450 7.418 7.358 7.390 7.401 -   PCO2ART 32.0 - 48.0 mmHg 31.3(L) 40.6 35.0 33.4 -   HCO3 20.0 - 28.0 mmol/L 20.2 22.9 21.2 20.7 -   TCO2 22 - 32 mmol/L 21(L) '24 22 22 '$ -   ACIDBASEDEF 0.0 - 2.0 mmol/L 4.0(H) 2.0  3.0(H) 3.0(H) -   O2SAT % 94.0 90.0 90.0 89.0 -       Capillary Blood Glucose: Lab Results  Component Value Date   GLUCAP 100 (H) 01/14/2021   GLUCAP 102 (H) 01/13/2021   GLUCAP 103 (H) 01/13/2021   GLUCAP 96 01/13/2021   GLUCAP 101 (H) 01/13/2021     Exercise Target Goals: Exercise Program Goal: Individual exercise prescription set using results from initial 6 min walk test and THRR while considering  patient's activity barriers and safety.   Exercise Prescription Goal: Starting with aerobic activity 30 plus minutes a day, 3 days per week for initial exercise prescription. Provide home exercise prescription and guidelines that participant acknowledges understanding prior to discharge.  Activity Barriers & Risk Stratification:  Activity Barriers & Cardiac Risk Stratification - 03/30/21 1519       Activity Barriers & Cardiac Risk Stratification   Activity Barriers Back Problems;Neck/Spine Problems    Cardiac Risk Stratification High             6  Minute Walk:  6 Minute Walk     Row Name 03/30/21 1313         6 Minute Walk   Phase Initial     Distance 1436 feet     Walk Time 6 minutes     # of Rest Breaks 0     MPH 2.72     METS 3.4     RPE 11     Perceived Dyspnea  0     VO2 Peak 11.9     Symptoms No     Resting HR 65 bpm     Resting BP 110/70     Resting Oxygen Saturation  99 %     Exercise Oxygen Saturation  during 6 min walk 98 %     Max Ex. HR 84 bpm     Max Ex. BP 120/72     2 Minute Post BP 112/64              Oxygen Initial Assessment:   Oxygen Re-Evaluation:   Oxygen Discharge (Final Oxygen Re-Evaluation):   Initial Exercise Prescription:  Initial Exercise Prescription - 03/30/21 1500       Date of Initial Exercise RX and Referring Provider   Date 03/30/21    Referring Provider Rex Kras, MD    Expected Discharge Date 05/26/21      Bike   Level 2    Minutes 15    METs 30      NuStep   Level 2    SPM 85    Minutes 15    METs 2.4      Prescription Details   Frequency (times per week) 3    Duration Progress to 30 minutes of continuous aerobic without signs/symptoms of physical distress      Intensity   THRR 40-80% of Max Heartrate 64-128    Ratings of Perceived Exertion 11-13    Perceived Dyspnea 0-4      Progression   Progression Continue progressive overload as per policy without signs/symptoms or physical distress.      Resistance Training   Training Prescription Yes    Weight 5 lbs    Reps 10-15             Perform Capillary Blood Glucose checks as needed.  Exercise Prescription Changes:   Exercise Comments:   Exercise Goals and Review:   Exercise Goals     Row Name 03/30/21 1520  Exercise Goals   Increase Physical Activity Yes       Intervention Provide advice, education, support and counseling about physical activity/exercise needs.;Develop an individualized exercise prescription for aerobic and resistive training based on initial  evaluation findings, risk stratification, comorbidities and participant's personal goals.       Expected Outcomes Long Term: Add in home exercise to make exercise part of routine and to increase amount of physical activity.;Long Term: Exercising regularly at least 3-5 days a week.;Short Term: Attend rehab on a regular basis to increase amount of physical activity.       Increase Strength and Stamina Yes       Intervention Provide advice, education, support and counseling about physical activity/exercise needs.;Develop an individualized exercise prescription for aerobic and resistive training based on initial evaluation findings, risk stratification, comorbidities and participant's personal goals.       Expected Outcomes Short Term: Increase workloads from initial exercise prescription for resistance, speed, and METs.;Short Term: Perform resistance training exercises routinely during rehab and add in resistance training at home;Long Term: Improve cardiorespiratory fitness, muscular endurance and strength as measured by increased METs and functional capacity (6MWT)       Able to understand and use rate of perceived exertion (RPE) scale Yes       Intervention Provide education and explanation on how to use RPE scale       Expected Outcomes Short Term: Able to use RPE daily in rehab to express subjective intensity level;Long Term:  Able to use RPE to guide intensity level when exercising independently       Knowledge and understanding of Target Heart Rate Range (THRR) Yes       Intervention Provide education and explanation of THRR including how the numbers were predicted and where they are located for reference       Expected Outcomes Short Term: Able to state/look up THRR;Short Term: Able to use daily as guideline for intensity in rehab;Long Term: Able to use THRR to govern intensity when exercising independently       Understanding of Exercise Prescription Yes       Intervention Provide education,  explanation, and written materials on patient's individual exercise prescription       Expected Outcomes Short Term: Able to explain program exercise prescription;Long Term: Able to explain home exercise prescription to exercise independently                Exercise Goals Re-Evaluation :    Discharge Exercise Prescription (Final Exercise Prescription Changes):   Nutrition:  Target Goals: Understanding of nutrition guidelines, daily intake of sodium '1500mg'$ , cholesterol '200mg'$ , calories 30% from fat and 7% or less from saturated fats, daily to have 5 or more servings of fruits and vegetables.  Biometrics:  Pre Biometrics - 03/30/21 1505       Pre Biometrics   Waist Circumference 38.5 inches    Hip Circumference 42.5 inches    Waist to Hip Ratio 0.91 %    Triceps Skinfold 12 mm    % Body Fat 26.1 %    Grip Strength 33 kg    Flexibility 16.75 in    Single Leg Stand 30 seconds              Nutrition Therapy Plan and Nutrition Goals:   Nutrition Assessments:  MEDIFICTS Score Key: ?70 Need to make dietary changes  40-70 Heart Healthy Diet ? 40 Therapeutic Level Cholesterol Diet   Picture Your Plate Scores: [id number] Unhealthy dietary pattern with  much room for improvement. 41-50 Dietary pattern unlikely to meet recommendations for good health and room for improvement. 51-60 More healthful dietary pattern, with some room for improvement.  >60 Healthy dietary pattern, although there may be some specific behaviors that could be improved.    Nutrition Goals Re-Evaluation:   Nutrition Goals Discharge (Final Nutrition Goals Re-Evaluation):   Psychosocial: Target Goals: Acknowledge presence or absence of significant depression and/or stress, maximize coping skills, provide positive support system. Participant is able to verbalize types and ability to use techniques and skills needed for reducing stress and depression.  Initial Review & Psychosocial Screening:  Initial  Psych Review & Screening - 03/30/21 1520       Initial Review   Current issues with None Identified      Family Dynamics   Good Support System? Yes   [place] has his wife and children for support     Barriers   Psychosocial barriers to participate in program There are no identifiable barriers or psychosocial needs.      Screening Interventions   Interventions Encouraged to exercise             Quality of Life Scores:  Quality of Life - 03/30/21 1508       Quality of Life   Select Quality of Life      Quality of Life Scores   Health/Function Pre 26.93 %    Socioeconomic Pre 26.81 %    Psych/Spiritual Pre 28.79 %    Family Pre 26.4 %    GLOBAL Pre 27.2 %            Scores of 19 and below usually indicate a poorer quality of life in these areas.  A difference of  2-3 points is a clinically meaningful difference.  A difference of 2-3 points in the total score of the Quality of Life Index has been associated with significant improvement in overall quality of life, self-image, physical symptoms, and general health in studies assessing change in quality of life.  PHQ-9: Recent Review Flowsheet Data     Depression screen [facility] 2/9 03/30/2021 03/30/2021 12/05/2017 06/13/2017   Decreased Interest 0 0 0 0   Down, Depressed, Hopeless 0 0 0 0   PHQ - 2 Score 0 0 0 0   Altered sleeping - - 0 -   Tired, decreased energy - - 1 -   Change in appetite - - 0 -   Feeling bad or failure about yourself  - - 0 -   Trouble concentrating - - 0 -   Moving slowly or fidgety/restless - - 0 -   Suicidal thoughts - - 0 -   PHQ-9 Score - - 1 -      Interpretation of Total Score  Total Score Depression Severity:  1-4 = Minimal depression, 5-9 = Mild depression, 10-14 = Moderate depression, 15-19 = Moderately severe depression, 20-27 = Severe depression   Psychosocial Evaluation and Intervention:   Psychosocial Re-Evaluation:   Psychosocial Discharge (Final Psychosocial  Re-Evaluation):   Vocational Rehabilitation: Provide vocational rehab assistance to qualifying candidates.   Vocational Rehab Evaluation & Intervention:  Vocational Rehab - 03/30/21 1521       Initial Vocational Rehab Evaluation & Intervention   Assessment shows need for Vocational Rehabilitation No             Education: Education Goals: Education classes will be provided on a weekly basis, covering required topics. Participant will state understanding/return demonstration of topics presented.  Learning Barriers/Preferences:  Learning Barriers/Preferences - 03/30/21 1511       Learning Barriers/Preferences   Learning Barriers Sight;Hearing   wears glasses, has ringing in the ears   Learning Preferences Audio;Written Material;Computer/Internet;Group Instruction;Individual Instruction;Pictoral;Skilled Demonstration;Verbal Instruction;Video             Education Topics: Hypertension, Hypertension Reduction -Define heart disease and high blood pressure. Discus how high blood pressure affects the body and ways to reduce high blood pressure.   Exercise and Your Heart -Discuss why it is important to exercise, the FITT principles of exercise, normal and abnormal responses to exercise, and how to exercise safely.   Angina -Discuss definition of angina, causes of angina, treatment of angina, and how to decrease risk of having angina.   Cardiac Medications -Review what the following cardiac medications are used for, how they affect the body, and side effects that may occur when taking the medications.  Medications include Aspirin, Beta blockers, calcium channel blockers, ACE Inhibitors, angiotensin receptor blockers, diuretics, digoxin, and antihyperlipidemics.   Congestive Heart Failure -Discuss the definition of CHF, how to live with CHF, the signs and symptoms of CHF, and how keep track of weight and sodium intake.   Heart Disease and Intimacy -Discus the effect  sexual activity has on the heart, how changes occur during intimacy as we age, and safety during sexual activity.   Smoking Cessation / COPD -Discuss different methods to quit smoking, the health benefits of quitting smoking, and the definition of COPD.   Nutrition I: Fats -Discuss the types of cholesterol, what cholesterol does to the heart, and how cholesterol levels can be controlled.   Nutrition II: Labels -Discuss the different components of food labels and how to read food label   Heart Parts/Heart Disease and PAD -Discuss the anatomy of the heart, the pathway of blood circulation through the heart, and these are affected by heart disease.   Stress I: Signs and Symptoms -Discuss the causes of stress, how stress may lead to anxiety and depression, and ways to limit stress.   Stress II: Relaxation -Discuss different types of relaxation techniques to limit stress.   Warning Signs of Stroke / TIA -Discuss definition of a stroke, what the signs and symptoms are of a stroke, and how to identify when someone is having stroke.   Knowledge Questionnaire Score:  Knowledge Questionnaire Score - 03/30/21 1509       Knowledge Questionnaire Score   Pre Score 21/24             Core Components/Risk Factors/Patient Goals at Admission:  Personal Goals and Risk Factors at Admission - 03/30/21 1509       Core Components/Risk Factors/Patient Goals on Admission    Weight Management Yes;Weight Loss    Intervention Weight Management: Develop a combined nutrition and exercise program designed to reach desired caloric intake, while maintaining appropriate intake of nutrient and fiber, sodium and fats, and appropriate energy expenditure required for the weight goal.;Weight Management: Provide education and appropriate resources to help participant work on and attain dietary goals.;Weight Management/Obesity: Establish reasonable short term and long term weight goals.    Admit Weight 178  lb 5.6 oz (80.9 kg)    Expected Outcomes Short Term: Continue to assess and modify interventions until short term weight is achieved;Long Term: Adherence to nutrition and physical activity/exercise program aimed toward attainment of established weight goal;Weight Maintenance: Understanding of the daily nutrition guidelines, which includes 25-35% calories from fat, 7% or less cal from saturated fats,  less than '200mg'$  cholesterol, less than 1.5gm of sodium, & 5 or more servings of fruits and vegetables daily;Weight Loss: Understanding of general recommendations for a balanced deficit meal plan, which promotes 1-2 lb weight loss per week and includes a negative energy balance of [id number] kcal/d;Understanding recommendations for meals to include 15-35% energy as protein, 25-35% energy from fat, 35-60% energy from carbohydrates, less than '200mg'$  of dietary cholesterol, 20-35 gm of total fiber daily;Understanding of distribution of calorie intake throughout the day with the consumption of 4-5 meals/snacks    Hypertension Yes    Intervention Provide education on lifestyle modifcations including regular physical activity/exercise, weight management, moderate sodium restriction and increased consumption of fresh fruit, vegetables, and low fat dairy, alcohol moderation, and smoking cessation.;Monitor prescription use compliance.    Expected Outcomes Short Term: Continued assessment and intervention until BP is < 140/64m HG in hypertensive participants. < 130/856mHG in hypertensive participants with diabetes, heart failure or chronic kidney disease.;Long Term: Maintenance of blood pressure at goal levels.    Lipids Yes    Intervention Provide education and support for participant on nutrition & aerobic/resistive exercise along with prescribed medications to achieve LDL '70mg'$ , HDL >'40mg'$ .    Expected Outcomes Short Term: Participant states understanding of desired cholesterol values and is compliant with medications  prescribed. Participant is following exercise prescription and nutrition guidelines.;Long Term: Cholesterol controlled with medications as prescribed, with individualized exercise RX and with personalized nutrition plan. Value goals: LDL < '70mg'$ , HDL > 40 mg.             Core Components/Risk Factors/Patient Goals Review:    Core Components/Risk Factors/Patient Goals at Discharge (Final Review):    ITP Comments:  ITP Comments     Row Name 03/30/21 1519           ITP Comments Dr TrFransico HimD, Medical Director                Comments:Less attended orientation on 03/31/2021 to review rules and guidelines for program.  Completed 6 minute walk test, Intitial ITP, and exercise prescription.  VSS. Telemetry-Sinus Rhythm.  Asymptomatic. Safety measures and social distancing in place per CDC guidelines. Patient had no reports of neck or jaw pain.MaBarnet PallRN,BSN 03/31/2021 8:30 AM

## 2021-03-30 NOTE — Progress Notes (Signed)
Cardiac Rehab Medication Review by a Pharmacist  Does the patient  feel that his/her medications are working for him/her?  yes  Has the patient been experiencing any side effects to the medications prescribed?  no  Does the patient measure his/her own blood pressure or blood glucose at home?  yes   Does the patient have any problems obtaining medications due to transportation or finances?   no  Understanding of regimen: good Understanding of indications: good Potential of compliance: good    Nurse comments: Eugene Mccullough is taking his medications as prescribed and has a good understanding of what his medications are for. Eugene Mccullough has a BP monitor/ cuff at home. Eugene Mccullough says he does not check his BP on a regular basis.    Eugene Gave RN BSN 03/30/2021 1:35 PM

## 2021-03-31 ENCOUNTER — Encounter (HOSPITAL_COMMUNITY): Payer: Self-pay

## 2021-04-03 ENCOUNTER — Encounter (HOSPITAL_COMMUNITY)
Admission: RE | Admit: 2021-04-03 | Discharge: 2021-04-03 | Disposition: A | Discharge status: Home / Self Care | Payer: 59 | Source: Ambulatory Visit | Attending: Cardiology | Admitting: Cardiology

## 2021-04-03 ENCOUNTER — Other Ambulatory Visit: Payer: Self-pay

## 2021-04-03 VITALS — Wt 179.0 lb

## 2021-04-03 DIAGNOSIS — Z951 Presence of aortocoronary bypass graft: Secondary | ICD-10-CM | POA: Diagnosis present

## 2021-04-03 DIAGNOSIS — Z48812 Encounter for surgical aftercare following surgery on the circulatory system: Secondary | ICD-10-CM | POA: Diagnosis not present

## 2021-04-03 NOTE — Progress Notes (Signed)
Daily Session Note  Patient Details  Name: Eugene Mccullough MRN: [id number] Date of Birth: August 26, 1961 Referring Provider:   Flowsheet Row CARDIAC REHAB PHASE II ORIENTATION from 03/30/2021 in [place]  Referring Provider Rex Kras, MD       Encounter Date: 04/03/2021  Check In:  Session Check In - 04/03/21 1329       Check-In   Supervising physician immediately available to respond to emergencies [facility] immediately available    Physician(s) Dr Broadus John    Location [facility]    Staff Present Barnet Pall, RN, BSN;Carlette Wilber Oliphant, RN, BSN;Jetta Walker BS, ACSM EP-C, Exercise Physiologist;Olinty Celesta Aver, MS, ACSM CEP, Exercise Physiologist    Virtual Visit No    Medication changes reported     No    Fall or balance concerns reported    No    Tobacco Cessation No Change    Current number of cigarettes/nicotine per day     0    Warm-up and Cool-down Performed on first and last piece of equipment   Cardiac Rehab Orientation   Resistance Training Performed Yes    VAD Patient? No    PAD/SET Patient? No      Pain Assessment   Currently in Pain? No/denies    Pain Score 0-No pain    Multiple Pain Sites No             Capillary Blood Glucose: No results found for this or any previous visit (from the past 24 hour(s)).   Exercise Prescription Changes - 04/03/21 1331       Response to Exercise   Blood Pressure (Admit) 98/60    Blood Pressure (Exercise) 130/72    Blood Pressure (Exit) 98/70    Heart Rate (Admit) 86 bpm    Heart Rate (Exercise) 92 bpm    Heart Rate (Exit) 73 bpm    Rating of Perceived Exertion (Exercise) 11    Symptoms None    Comments Off to a good start with exercise.    Duration Continue with 30 min of aerobic exercise without signs/symptoms of physical distress.    Intensity THRR unchanged      Progression   Progression Continue to progress workloads to maintain intensity without  signs/symptoms of physical distress.    Average METs 2.6      Resistance Training   Training Prescription Yes    Weight 5 lbs    Reps 10-15    Time 10 Minutes      Interval Training   Interval Training No      Bike   Level 2    Minutes 15    METs 3      NuStep   Level 2    SPM 85    Minutes 15    METs 2.2             Social History   Tobacco Use  Smoking Status Former   Packs/day: 1.00   Years: 20.00   Pack years: 20.00   Types: Cigarettes   Quit date: 2014   Years since quitting: 8.6  Smokeless Tobacco Never    Goals Met:  Exercise tolerated well No report of cardiac concerns or symptoms Strength training completed today  Goals Unmet:  Not Applicable  Comments: [name] started cardiac rehab today.  Pt tolerated light exercise without difficulty. VSS, telemetry-Sinus Rhythm, asymptomatic.  Medication list reconciled. Pt denies barriers to medicaiton compliance.  PSYCHOSOCIAL ASSESSMENT:  PHQ-0. Pt exhibits  positive coping skills, hopeful outlook with supportive family. No psychosocial needs identified at this time, no psychosocial interventions necessary.    Pt enjoys reading watching TV and driving.   Pt oriented to exercise equipment and routine.    Understanding verbalized.Barnet Pall, RN,BSN 04/03/2021 4:18 PM    Dr. Fransico Him is Medical Director for Cardiac Rehab at [facility].

## 2021-04-05 ENCOUNTER — Other Ambulatory Visit: Payer: Self-pay

## 2021-04-05 ENCOUNTER — Encounter (HOSPITAL_COMMUNITY)
Admission: RE | Admit: 2021-04-05 | Discharge: 2021-04-05 | Disposition: A | Discharge status: Home / Self Care | Payer: 59 | Source: Ambulatory Visit | Attending: Cardiology | Admitting: Cardiology

## 2021-04-05 DIAGNOSIS — Z951 Presence of aortocoronary bypass graft: Secondary | ICD-10-CM

## 2021-04-05 DIAGNOSIS — Z48812 Encounter for surgical aftercare following surgery on the circulatory system: Secondary | ICD-10-CM | POA: Diagnosis not present

## 2021-04-06 NOTE — Progress Notes (Signed)
Eugene Mccullough 60 y.o. male Nutrition Note  Diagnosis:CABGx4  Past Medical History:  Diagnosis Date   Anemia    Anginal pain (HCC)    Arthritis    BPH (benign prostatic hyperplasia)    Coronary artery disease    Eczema    Gastric ulcer    GERD (gastroesophageal reflux disease)    Gout    H. pylori infection    Hyperlipidemia    Hypertension    IBS (irritable bowel syndrome)    Pre-diabetes    Sleep apnea    Does not use CPAP     Medications reviewed.   Current Outpatient Medications:    amLODipine (NORVASC) 5 MG tablet, Take 1 tablet (5 mg total) by mouth daily., Disp: 90 tablet, Rfl: 1   aspirin EC 81 MG tablet, Take 1 tablet (81 mg total) by mouth daily. Swallow whole., Disp: 90 tablet, Rfl: 3   atorvastatin (LIPITOR) 40 MG tablet, Take 1 tablet (40 mg total) by mouth at bedtime., Disp: 90 tablet, Rfl: 1   clopidogrel (PLAVIX) 75 MG tablet, Take 1 tablet (75 mg total) by mouth daily., Disp: 90 tablet, Rfl: 3   finasteride (PROSCAR) 5 MG tablet, TAKE 1 TABLET BY MOUTH EVERY DAY, Disp: 90 tablet, Rfl: 1   metoprolol tartrate (LOPRESSOR) 25 MG tablet, Take 1 tablet (25 mg total) by mouth 2 (two) times daily., Disp: 60 tablet, Rfl: 2   nitroGLYCERIN (NITROSTAT) 0.4 MG SL tablet, PLACE 1 TABLET (0.4 MG TOTAL) UNDER THE TONGUE EVERY 5 (FIVE) MINUTES AS NEEDED FOR CHEST PAIN. IF YOU REQUIRE MORE THAN TWO TABLETS FIVE MINUTES APART GO TO THE NEAREST ER VIA EMS., Disp: 25 tablet, Rfl: 0   pantoprazole (PROTONIX) 40 MG tablet, Take 40 mg by mouth 2 (two) times daily., Disp: , Rfl:    tamsulosin (FLOMAX) 0.4 MG CAPS capsule, Take 0.4 mg by mouth daily., Disp: , Rfl:    Ht Readings from Last 1 Encounters:  03/30/21 5' 6.5" (1.689 m)     Wt Readings from Last 3 Encounters:  04/06/21 179 lb 0.2 oz (81.2 kg)  03/30/21 178 lb 5.6 oz (80.9 kg)  03/29/21 181 lb (82.1 kg)     There is no height or weight on file to calculate BMI.   Social History   Tobacco Use  Smoking Status  Former   Packs/day: 1.00   Years: 20.00   Pack years: 20.00   Types: Cigarettes   Quit date: 2014   Years since quitting: 8.6  Smokeless Tobacco Never     Lab Results  Component Value Date   CHOL 97 (L) 02/21/2021   Lab Results  Component Value Date   HDL 31 (L) 02/21/2021   Lab Results  Component Value Date   LDLCALC 47 02/21/2021   Lab Results  Component Value Date   TRIG 98 02/21/2021     Lab Results  Component Value Date   HGBA1C 5.3 01/06/2021     CBG (last 3)  No results for input(s): GLUCAP in the last 72 hours.   Nutrition Note  Spoke with pt. Nutrition Plan and Nutrition Survey goals reviewed with pt. Pt is following a Heart Healthy diet. Pt wants to lose wt. Pt has been trying to lose wt by eating a low carb and high protein diet. Wt loss tips reviewed (label reading, how to build a healthy plate, portion sizes, eating frequently across the day). He has lost 22 lbs. He wants to lose 10-12 lbs more. He  does not count calories.  Reviewed lipid panel and discussed recommendations for out of range values. Noted LDL WNL. HDL low.  Diet recall: Breakfast Yogurt with chia and flax seeds, eggsx2 Lunch: Walnuts sometimes -- small  Dinner: Mixed vegetable soup - homemade Chicken, fruit   He does not eat after 6 pm due to acid reflux.   Pt expressed understanding of the information reviewed.    Nutrition Diagnosis  Overweight  related to excessive energy intake as evidenced by a BMI 28.46 kg/m2  Nutrition Intervention Pt's individual nutrition plan reviewed with pt. Benefits of adopting Heart Healthy diet discussed when Picture Your Plate reviewed.  Continue client-centered nutrition education by RD, as part of interdisciplinary care.  Goal(s)  Pt to identify food quantities necessary to achieve weight loss of 6-24 lb at graduation from cardiac rehab.  Pt to build a healthy plate including vegetables, fruits, whole grains, and low-fat dairy products in  a heart healthy meal plan.  Plan:  Will provide client-centered nutrition education as part of interdisciplinary care Monitor and evaluate progress toward nutrition goal with team.   Michaele Offer, MS, RDN, LDN

## 2021-04-07 ENCOUNTER — Encounter (HOSPITAL_COMMUNITY)
Admission: RE | Admit: 2021-04-07 | Discharge: 2021-04-07 | Disposition: A | Discharge status: Home / Self Care | Payer: 59 | Source: Ambulatory Visit | Attending: Cardiology | Admitting: Cardiology

## 2021-04-07 ENCOUNTER — Other Ambulatory Visit: Payer: Self-pay

## 2021-04-07 DIAGNOSIS — Z951 Presence of aortocoronary bypass graft: Secondary | ICD-10-CM

## 2021-04-07 DIAGNOSIS — Z48812 Encounter for surgical aftercare following surgery on the circulatory system: Secondary | ICD-10-CM | POA: Diagnosis not present

## 2021-04-10 ENCOUNTER — Encounter (HOSPITAL_COMMUNITY)
Admission: RE | Admit: 2021-04-10 | Discharge: 2021-04-10 | Disposition: A | Discharge status: Home / Self Care | Payer: 59 | Source: Ambulatory Visit | Attending: Cardiology | Admitting: Cardiology

## 2021-04-10 ENCOUNTER — Other Ambulatory Visit: Payer: Self-pay

## 2021-04-10 DIAGNOSIS — Z951 Presence of aortocoronary bypass graft: Secondary | ICD-10-CM

## 2021-04-10 DIAGNOSIS — Z48812 Encounter for surgical aftercare following surgery on the circulatory system: Secondary | ICD-10-CM | POA: Diagnosis not present

## 2021-04-11 NOTE — Progress Notes (Signed)
Cardiac Individual Treatment Plan  Patient Details  Name: Eugene Mccullough MRN: [id number] Date of Birth: Jul 16, 1961 Referring Provider:   Flowsheet Row CARDIAC REHAB PHASE II ORIENTATION from 03/30/2021 in [place]  Referring Provider Rex Kras, MD       Initial Encounter Date:  [place] PHASE II ORIENTATION from 03/30/2021 in [place]  Date 03/30/21       Visit Diagnosis:  01/10/21 S/P CABG x 4  Patient's Home Medications on Admission:  Current Outpatient Medications:    amLODipine (NORVASC) 5 MG tablet, Take 1 tablet (5 mg total) by mouth daily., Disp: 90 tablet, Rfl: 1   aspirin EC 81 MG tablet, Take 1 tablet (81 mg total) by mouth daily. Swallow whole., Disp: 90 tablet, Rfl: 3   atorvastatin (LIPITOR) 40 MG tablet, Take 1 tablet (40 mg total) by mouth at bedtime., Disp: 90 tablet, Rfl: 1   clopidogrel (PLAVIX) 75 MG tablet, Take 1 tablet (75 mg total) by mouth daily., Disp: 90 tablet, Rfl: 3   finasteride (PROSCAR) 5 MG tablet, TAKE 1 TABLET BY MOUTH EVERY DAY, Disp: 90 tablet, Rfl: 1   metoprolol tartrate (LOPRESSOR) 25 MG tablet, Take 1 tablet (25 mg total) by mouth 2 (two) times daily., Disp: 60 tablet, Rfl: 2   nitroGLYCERIN (NITROSTAT) 0.4 MG SL tablet, PLACE 1 TABLET (0.4 MG TOTAL) UNDER THE TONGUE EVERY 5 (FIVE) MINUTES AS NEEDED FOR CHEST PAIN. IF YOU REQUIRE MORE THAN TWO TABLETS FIVE MINUTES APART GO TO THE NEAREST ER VIA EMS., Disp: 25 tablet, Rfl: 0   pantoprazole (PROTONIX) 40 MG tablet, Take 40 mg by mouth 2 (two) times daily., Disp: , Rfl:    tamsulosin (FLOMAX) 0.4 MG CAPS capsule, Take 0.4 mg by mouth daily., Disp: , Rfl:   Past Medical History: Past Medical History:  Diagnosis Date   Anemia    Anginal pain (HCC)    Arthritis    BPH (benign prostatic hyperplasia)    Coronary artery disease    Eczema    Gastric ulcer    GERD (gastroesophageal reflux disease)    Gout     H. pylori infection    Hyperlipidemia    Hypertension    IBS (irritable bowel syndrome)    Pre-diabetes    Sleep apnea    Does not use CPAP    Tobacco Use: Social History   Tobacco Use  Smoking Status Former   Packs/day: 1.00   Years: 20.00   Pack years: 20.00   Types: Cigarettes   Quit date: 2014   Years since quitting: 8.6  Smokeless Tobacco Never    Labs: Recent Review Scientist, physiological     Labs for ITP Cardiac and Pulmonary Rehab Latest Ref Rng & Units 01/11/2021 01/11/2021 01/11/2021 01/11/2021 02/21/2021   Cholestrol 100 - 199 mg/dL - - - - 97(L)   LDLCALC 0 - 99 mg/dL - - - - 47   LDLDIRECT 0 - 99 mg/dL - - - - 52   HDL >39 mg/dL - - - - 31(L)   Trlycerides 0 - 149 mg/dL - - - - 98   Hemoglobin A1c 4.8 - 5.6 % - - - - -   PHART 7.350 - 7.450 7.418 7.358 7.390 7.401 -   PCO2ART 32.0 - 48.0 mmHg 31.3(L) 40.6 35.0 33.4 -   HCO3 20.0 - 28.0 mmol/L 20.2 22.9 21.2 20.7 -   TCO2 22 - 32 mmol/L 21(L) 24 22  22 -   ACIDBASEDEF 0.0 - 2.0 mmol/L 4.0(H) 2.0 3.0(H) 3.0(H) -   O2SAT % 94.0 90.0 90.0 89.0 -       Capillary Blood Glucose: Lab Results  Component Value Date   GLUCAP 100 (H) 01/14/2021   GLUCAP 102 (H) 01/13/2021   GLUCAP 103 (H) 01/13/2021   GLUCAP 96 01/13/2021   GLUCAP 101 (H) 01/13/2021     Exercise Target Goals: Exercise Program Goal: Individual exercise prescription set using results from initial 6 min walk test and THRR while considering  patient's activity barriers and safety.   Exercise Prescription Goal: Starting with aerobic activity 30 plus minutes a day, 3 days per week for initial exercise prescription. Provide home exercise prescription and guidelines that participant acknowledges understanding prior to discharge.  Activity Barriers & Risk Stratification:  Activity Barriers & Cardiac Risk Stratification - 03/30/21 1519       Activity Barriers & Cardiac Risk Stratification   Activity Barriers Back Problems;Neck/Spine Problems    Cardiac  Risk Stratification High             6 Minute Walk:  6 Minute Walk     Row Name 03/30/21 1313         6 Minute Walk   Phase Initial     Distance 1436 feet     Walk Time 6 minutes     # of Rest Breaks 0     MPH 2.72     METS 3.4     RPE 11     Perceived Dyspnea  0     VO2 Peak 11.9     Symptoms No     Resting HR 65 bpm     Resting BP 110/70     Resting Oxygen Saturation  99 %     Exercise Oxygen Saturation  during 6 min walk 98 %     Max Ex. HR 84 bpm     Max Ex. BP 120/72     2 Minute Post BP 112/64              Oxygen Initial Assessment:   Oxygen Re-Evaluation:   Oxygen Discharge (Final Oxygen Re-Evaluation):   Initial Exercise Prescription:  Initial Exercise Prescription - 03/30/21 1500       Date of Initial Exercise RX and Referring Provider   Date 03/30/21    Referring Provider Rex Kras, MD    Expected Discharge Date 05/26/21      Bike   Level 2    Minutes 15    METs 30      NuStep   Level 2    SPM 85    Minutes 15    METs 2.4      Prescription Details   Frequency (times per week) 3    Duration Progress to 30 minutes of continuous aerobic without signs/symptoms of physical distress      Intensity   THRR 40-80% of Max Heartrate 64-128    Ratings of Perceived Exertion 11-13    Perceived Dyspnea 0-4      Progression   Progression Continue progressive overload as per policy without signs/symptoms or physical distress.      Resistance Training   Training Prescription Yes    Weight 5 lbs    Reps 10-15             Perform Capillary Blood Glucose checks as needed.  Exercise Prescription Changes:   Exercise Prescription Changes     Row Name 04/03/21  1331             Response to Exercise   Blood Pressure (Admit) 98/60       Blood Pressure (Exercise) 130/72       Blood Pressure (Exit) 98/70       Heart Rate (Admit) 86 bpm       Heart Rate (Exercise) 92 bpm       Heart Rate (Exit) 73 bpm       Rating of  Perceived Exertion (Exercise) 11       Symptoms None       Comments Off to a good start with exercise.       Duration Continue with 30 min of aerobic exercise without signs/symptoms of physical distress.       Intensity THRR unchanged               Progression   Progression Continue to progress workloads to maintain intensity without signs/symptoms of physical distress.       Average METs 2.6               Resistance Training   Training Prescription Yes       Weight 5 lbs       Reps 10-15       Time 10 Minutes               Interval Training   Interval Training No               Bike   Level 2       Minutes 15       METs 3               NuStep   Level 2       SPM 85       Minutes 15       METs 2.2                Exercise Comments:   Exercise Comments     Row Name 04/03/21 1425           Exercise Comments Patient tolerated exercise well without symptoms. Oriented patient to weights without difficulty.                Exercise Goals and Review:   Exercise Goals     Row Name 03/30/21 1520             Exercise Goals   Increase Physical Activity Yes       Intervention Provide advice, education, support and counseling about physical activity/exercise needs.;Develop an individualized exercise prescription for aerobic and resistive training based on initial evaluation findings, risk stratification, comorbidities and participant's personal goals.       Expected Outcomes Long Term: Add in home exercise to make exercise part of routine and to increase amount of physical activity.;Long Term: Exercising regularly at least 3-5 days a week.;Short Term: Attend rehab on a regular basis to increase amount of physical activity.       Increase Strength and Stamina Yes       Intervention Provide advice, education, support and counseling about physical activity/exercise needs.;Develop an individualized exercise prescription for aerobic and resistive training based on  initial evaluation findings, risk stratification, comorbidities and participant's personal goals.       Expected Outcomes Short Term: Increase workloads from initial exercise prescription for resistance, speed, and METs.;Short Term: Perform resistance training exercises routinely during rehab and add in resistance training at home;Long  Term: Improve cardiorespiratory fitness, muscular endurance and strength as measured by increased METs and functional capacity (6MWT)       Able to understand and use rate of perceived exertion (RPE) scale Yes       Intervention Provide education and explanation on how to use RPE scale       Expected Outcomes Short Term: Able to use RPE daily in rehab to express subjective intensity level;Long Term:  Able to use RPE to guide intensity level when exercising independently       Knowledge and understanding of Target Heart Rate Range (THRR) Yes       Intervention Provide education and explanation of THRR including how the numbers were predicted and where they are located for reference       Expected Outcomes Short Term: Able to state/look up THRR;Short Term: Able to use daily as guideline for intensity in rehab;Long Term: Able to use THRR to govern intensity when exercising independently       Understanding of Exercise Prescription Yes       Intervention Provide education, explanation, and written materials on patient's individual exercise prescription       Expected Outcomes Short Term: Able to explain program exercise prescription;Long Term: Able to explain home exercise prescription to exercise independently                Exercise Goals Re-Evaluation :  Exercise Goals Re-Evaluation     [place] Name 04/03/21 1425             Exercise Goal Re-Evaluation   Exercise Goals Review Increase Physical Activity;Able to understand and use rate of perceived exertion (RPE) scale       Comments Patient able to understand and use RPE scale appropriately.       Expected  Outcomes Progress workloads as tolerated to help achieve personal health and fitness goals.                 Discharge Exercise Prescription (Final Exercise Prescription Changes):  Exercise Prescription Changes - 04/03/21 1331       Response to Exercise   Blood Pressure (Admit) 98/60    Blood Pressure (Exercise) 130/72    Blood Pressure (Exit) 98/70    Heart Rate (Admit) 86 bpm    Heart Rate (Exercise) 92 bpm    Heart Rate (Exit) 73 bpm    Rating of Perceived Exertion (Exercise) 11    Symptoms None    Comments Off to a good start with exercise.    Duration Continue with 30 min of aerobic exercise without signs/symptoms of physical distress.    Intensity THRR unchanged      Progression   Progression Continue to progress workloads to maintain intensity without signs/symptoms of physical distress.    Average METs 2.6      Resistance Training   Training Prescription Yes    Weight 5 lbs    Reps 10-15    Time 10 Minutes      Interval Training   Interval Training No      Bike   Level 2    Minutes 15    METs 3      NuStep   Level 2    SPM 85    Minutes 15    METs 2.2             Nutrition:  Target Goals: Understanding of nutrition guidelines, daily intake of sodium '1500mg'$ , cholesterol '200mg'$ , calories 30% from fat and 7% or less  from saturated fats, daily to have 5 or more servings of fruits and vegetables.  Biometrics:  Pre Biometrics - 03/30/21 1505       Pre Biometrics   Waist Circumference 38.5 inches    Hip Circumference 42.5 inches    Waist to Hip Ratio 0.91 %    Triceps Skinfold 12 mm    % Body Fat 26.1 %    Grip Strength 33 kg    Flexibility 16.75 in    Single Leg Stand 30 seconds              Nutrition Therapy Plan and Nutrition Goals:  Nutrition Therapy & Goals - 04/06/21 1341       Nutrition Therapy   Diet TLC    Drug/Food Interactions Statins/Certain Fruits      Personal Nutrition Goals   Nutrition Goal Pt to identify food  quantities necessary to achieve weight loss of 6-24 lb at graduation from cardiac rehab.    Personal Goal #2 Pt to build a healthy plate including vegetables, fruits, whole grains, and low-fat dairy products in a heart healthy meal plan      Intervention Plan   Intervention Prescribe, educate and counsel regarding individualized specific dietary modifications aiming towards targeted core components such as weight, hypertension, lipid management, diabetes, heart failure and other comorbidities.;Nutrition handout(s) given to patient.    Expected Outcomes Long Term Goal: Adherence to prescribed nutrition plan.;Short Term Goal: Understand basic principles of dietary content, such as calories, fat, sodium, cholesterol and nutrients.             Nutrition Assessments:  MEDIFICTS Score Key: ?70 Need to make dietary changes  40-70 Heart Healthy Diet ? 40 Therapeutic Level Cholesterol Diet  Flowsheet Row CARDIAC REHAB PHASE II EXERCISE from 04/03/2021 in [place]  Picture Your Plate Total Score on Admission 72      Picture Your Plate Scores: [id number] Unhealthy dietary pattern with much room for improvement. 41-50 Dietary pattern unlikely to meet recommendations for good health and room for improvement. 51-60 More healthful dietary pattern, with some room for improvement.  >60 Healthy dietary pattern, although there may be some specific behaviors that could be improved.    Nutrition Goals Re-Evaluation:  Nutrition Goals Re-Evaluation     [place] Name 04/06/21 1341             Goals   Current Weight 179 lb (81.2 kg)                Nutrition Goals Discharge (Final Nutrition Goals Re-Evaluation):  Nutrition Goals Re-Evaluation - 04/06/21 1341       Goals   Current Weight 179 lb (81.2 kg)             Psychosocial: Target Goals: Acknowledge presence or absence of significant depression and/or stress, maximize coping skills, provide positive  support system. Participant is able to verbalize types and ability to use techniques and skills needed for reducing stress and depression.  Initial Review & Psychosocial Screening:  Initial Psych Review & Screening - 03/30/21 1520       Initial Review   Current issues with None Identified      Family Dynamics   Good Support System? Yes   Meeko has his wife and children for support     Barriers   Psychosocial barriers to participate in program There are no identifiable barriers or psychosocial needs.      Screening Interventions   Interventions Encouraged to  exercise             Quality of Life Scores:  Quality of Life - 03/30/21 1508       Quality of Life   Select Quality of Life      Quality of Life Scores   Health/Function Pre 26.93 %    Socioeconomic Pre 26.81 %    Psych/Spiritual Pre 28.79 %    Family Pre 26.4 %    GLOBAL Pre 27.2 %            Scores of 19 and below usually indicate a poorer quality of life in these areas.  A difference of  2-3 points is a clinically meaningful difference.  A difference of 2-3 points in the total score of the Quality of Life Index has been associated with significant improvement in overall quality of life, self-image, physical symptoms, and general health in studies assessing change in quality of life.  PHQ-9: Recent Review Flowsheet Data     Depression screen [facility] 2/9 03/30/2021 03/30/2021 12/05/2017 06/13/2017   Decreased Interest 0 0 0 0   Down, Depressed, Hopeless 0 0 0 0   PHQ - 2 Score 0 0 0 0   Altered sleeping - - 0 -   Tired, decreased energy - - 1 -   Change in appetite - - 0 -   Feeling bad or failure about yourself  - - 0 -   Trouble concentrating - - 0 -   Moving slowly or fidgety/restless - - 0 -   Suicidal thoughts - - 0 -   PHQ-9 Score - - 1 -      Interpretation of Total Score  Total Score Depression Severity:  1-4 = Minimal depression, 5-9 = Mild depression, 10-14 = Moderate depression, 15-19 =  Moderately severe depression, 20-27 = Severe depression   Psychosocial Evaluation and Intervention:   Psychosocial Re-Evaluation:  Psychosocial Re-Evaluation     [place] Name 04/03/21 1622 04/11/21 1309           Psychosocial Re-Evaluation   Current issues with None Identified None Identified      Interventions Encouraged to attend Cardiac Rehabilitation for the exercise Encouraged to attend Cardiac Rehabilitation for the exercise      Continue Psychosocial Services  No Follow up required No Follow up required               Psychosocial Discharge (Final Psychosocial Re-Evaluation):  Psychosocial Re-Evaluation - 04/11/21 1309       Psychosocial Re-Evaluation   Current issues with None Identified    Interventions Encouraged to attend Cardiac Rehabilitation for the exercise    Continue Psychosocial Services  No Follow up required             Vocational Rehabilitation: Provide vocational rehab assistance to qualifying candidates.   Vocational Rehab Evaluation & Intervention:  Vocational Rehab - 03/30/21 1521       Initial Vocational Rehab Evaluation & Intervention   Assessment shows need for Vocational Rehabilitation No             Education: Education Goals: Education classes will be provided on a weekly basis, covering required topics. Participant will state understanding/return demonstration of topics presented.  Learning Barriers/Preferences:  Learning Barriers/Preferences - 03/30/21 1511       Learning Barriers/Preferences   Learning Barriers Sight;Hearing   wears glasses, has ringing in the ears   Learning Preferences Audio;Written Material;Computer/Internet;Group Instruction;Individual Instruction;Pictoral;Skilled Demonstration;Verbal Instruction;Video  Education Topics: Hypertension, Hypertension Reduction -Define heart disease and high blood pressure. Discus how high blood pressure affects the body and ways to reduce high blood  pressure.   Exercise and Your Heart -Discuss why it is important to exercise, the FITT principles of exercise, normal and abnormal responses to exercise, and how to exercise safely.   Angina -Discuss definition of angina, causes of angina, treatment of angina, and how to decrease risk of having angina.   Cardiac Medications -Review what the following cardiac medications are used for, how they affect the body, and side effects that may occur when taking the medications.  Medications include Aspirin, Beta blockers, calcium channel blockers, ACE Inhibitors, angiotensin receptor blockers, diuretics, digoxin, and antihyperlipidemics.   Congestive Heart Failure -Discuss the definition of CHF, how to live with CHF, the signs and symptoms of CHF, and how keep track of weight and sodium intake.   Heart Disease and Intimacy -Discus the effect sexual activity has on the heart, how changes occur during intimacy as we age, and safety during sexual activity.   Smoking Cessation / COPD -Discuss different methods to quit smoking, the health benefits of quitting smoking, and the definition of COPD.   Nutrition I: Fats -Discuss the types of cholesterol, what cholesterol does to the heart, and how cholesterol levels can be controlled.   Nutrition II: Labels -Discuss the different components of food labels and how to read food label   Heart Parts/Heart Disease and PAD -Discuss the anatomy of the heart, the pathway of blood circulation through the heart, and these are affected by heart disease.   Stress I: Signs and Symptoms -Discuss the causes of stress, how stress may lead to anxiety and depression, and ways to limit stress.   Stress II: Relaxation -Discuss different types of relaxation techniques to limit stress.   Warning Signs of Stroke / TIA -Discuss definition of a stroke, what the signs and symptoms are of a stroke, and how to identify when someone is having stroke.   Knowledge  Questionnaire Score:  Knowledge Questionnaire Score - 03/30/21 1509       Knowledge Questionnaire Score   Pre Score 21/24             Core Components/Risk Factors/Patient Goals at Admission:  Personal Goals and Risk Factors at Admission - 03/30/21 1509       Core Components/Risk Factors/Patient Goals on Admission    Weight Management Yes;Weight Loss    Intervention Weight Management: Develop a combined nutrition and exercise program designed to reach desired caloric intake, while maintaining appropriate intake of nutrient and fiber, sodium and fats, and appropriate energy expenditure required for the weight goal.;Weight Management: Provide education and appropriate resources to help participant work on and attain dietary goals.;Weight Management/Obesity: Establish reasonable short term and long term weight goals.    Admit Weight 178 lb 5.6 oz (80.9 kg)    Expected Outcomes Short Term: Continue to assess and modify interventions until short term weight is achieved;Long Term: Adherence to nutrition and physical activity/exercise program aimed toward attainment of established weight goal;Weight Maintenance: Understanding of the daily nutrition guidelines, which includes 25-35% calories from fat, 7% or less cal from saturated fats, less than '200mg'$  cholesterol, less than 1.5gm of sodium, & 5 or more servings of fruits and vegetables daily;Weight Loss: Understanding of general recommendations for a balanced deficit meal plan, which promotes 1-2 lb weight loss per week and includes a negative energy balance of [phone] kcal/d;Understanding recommendations for meals to include  15-35% energy as protein, 25-35% energy from fat, 35-60% energy from carbohydrates, less than '200mg'$  of dietary cholesterol, 20-35 gm of total fiber daily;Understanding of distribution of calorie intake throughout the day with the consumption of 4-5 meals/snacks    Hypertension Yes    Intervention Provide education on lifestyle  modifcations including regular physical activity/exercise, weight management, moderate sodium restriction and increased consumption of fresh fruit, vegetables, and low fat dairy, alcohol moderation, and smoking cessation.;Monitor prescription use compliance.    Expected Outcomes Short Term: Continued assessment and intervention until BP is < 140/72m HG in hypertensive participants. < 130/856mHG in hypertensive participants with diabetes, heart failure or chronic kidney disease.;Long Term: Maintenance of blood pressure at goal levels.    Lipids Yes    Intervention Provide education and support for participant on nutrition & aerobic/resistive exercise along with prescribed medications to achieve LDL '70mg'$ , HDL >'40mg'$ .    Expected Outcomes Short Term: Participant states understanding of desired cholesterol values and is compliant with medications prescribed. Participant is following exercise prescription and nutrition guidelines.;Long Term: Cholesterol controlled with medications as prescribed, with individualized exercise RX and with personalized nutrition plan. Value goals: LDL < '70mg'$ , HDL > 40 mg.             Core Components/Risk Factors/Patient Goals Review:   Goals and Risk Factor Review     Row Name 04/03/21 1625 04/11/21 1310           Core Components/Risk Factors/Patient Goals Review   Personal Goals Review Weight Management/Obesity;Hypertension;Lipids Weight Management/Obesity;Hypertension;Lipids      Review Sayge started exercise on 04/03/21 and did well with exerciser ZoWilleys off to a good start to exercise. Vital signs have been stable.      Expected Outcomes ZoTrelynill continue to partcipate in phase 2 cardiac rehab for exercise, nutrition and lifestyle modifications Daiton will continue to partcipate in phase 2 cardiac rehab for exercise, nutrition and lifestyle modifications               Core Components/Risk Factors/Patient Goals at Discharge (Final Review):   Goals  and Risk Factor Review - 04/11/21 1310       Core Components/Risk Factors/Patient Goals Review   Personal Goals Review Weight Management/Obesity;Hypertension;Lipids    Review ZoDontrezs off to a good start to exercise. Vital signs have been stable.    Expected Outcomes ZoMonteeill continue to partcipate in phase 2 cardiac rehab for exercise, nutrition and lifestyle modifications             ITP Comments:  ITP Comments     Row Name 03/30/21 1519 04/03/21 1619 04/11/21 1309       ITP Comments Dr TrFransico HimD, Medical Director 30 Day ITP Review. Samson started exercise on 04/03/21 and did well with exercise. 30 Day ITP Review. ZoJuanantonios off to a good start to exercise at phase 2 cardiac rehab.              Comments: See ITP comments.MaBarnet PallRN,BSN 04/11/2021 1:14 PM

## 2021-04-12 ENCOUNTER — Encounter (HOSPITAL_COMMUNITY)
Admission: RE | Admit: 2021-04-12 | Discharge: 2021-04-12 | Disposition: A | Discharge status: Home / Self Care | Payer: 59 | Source: Ambulatory Visit | Attending: Cardiology | Admitting: Cardiology

## 2021-04-12 ENCOUNTER — Other Ambulatory Visit: Payer: Self-pay

## 2021-04-12 DIAGNOSIS — Z48812 Encounter for surgical aftercare following surgery on the circulatory system: Secondary | ICD-10-CM | POA: Diagnosis not present

## 2021-04-12 DIAGNOSIS — Z951 Presence of aortocoronary bypass graft: Secondary | ICD-10-CM

## 2021-04-13 ENCOUNTER — Ambulatory Visit (HOSPITAL_COMMUNITY): Payer: 59

## 2021-04-14 ENCOUNTER — Encounter (HOSPITAL_COMMUNITY)
Admission: RE | Admit: 2021-04-14 | Discharge: 2021-04-14 | Disposition: A | Discharge status: Home / Self Care | Payer: 59 | Source: Ambulatory Visit | Attending: Cardiology | Admitting: Cardiology

## 2021-04-14 ENCOUNTER — Other Ambulatory Visit: Payer: Self-pay

## 2021-04-14 DIAGNOSIS — Z48812 Encounter for surgical aftercare following surgery on the circulatory system: Secondary | ICD-10-CM | POA: Diagnosis not present

## 2021-04-14 DIAGNOSIS — Z951 Presence of aortocoronary bypass graft: Secondary | ICD-10-CM

## 2021-04-15 ENCOUNTER — Other Ambulatory Visit: Payer: Self-pay | Admitting: Cardiology

## 2021-04-15 DIAGNOSIS — I25118 Atherosclerotic heart disease of native coronary artery with other forms of angina pectoris: Secondary | ICD-10-CM

## 2021-04-17 ENCOUNTER — Encounter (HOSPITAL_COMMUNITY): Payer: 59

## 2021-04-17 ENCOUNTER — Ambulatory Visit (HOSPITAL_COMMUNITY): Payer: 59

## 2021-04-19 ENCOUNTER — Other Ambulatory Visit: Payer: Self-pay

## 2021-04-19 ENCOUNTER — Encounter (HOSPITAL_COMMUNITY)
Admission: RE | Admit: 2021-04-19 | Discharge: 2021-04-19 | Disposition: A | Discharge status: Home / Self Care | Payer: 59 | Source: Ambulatory Visit | Attending: Cardiology | Admitting: Cardiology

## 2021-04-19 ENCOUNTER — Ambulatory Visit (HOSPITAL_COMMUNITY): Payer: 59

## 2021-04-19 ENCOUNTER — Telehealth: Payer: Self-pay | Admitting: Cardiology

## 2021-04-19 DIAGNOSIS — Z48812 Encounter for surgical aftercare following surgery on the circulatory system: Secondary | ICD-10-CM | POA: Diagnosis not present

## 2021-04-19 DIAGNOSIS — Z951 Presence of aortocoronary bypass graft: Secondary | ICD-10-CM

## 2021-04-19 NOTE — Progress Notes (Signed)
Pre exercise heart rate noted at 103. Asked Mr Eugene Mccullough to sit down to started exercise at a heart rate below 100. Heart  rate in the 80's then noted at 53 nonsustained. Blood pressure 102/60. Mr Eugene Mccullough proceeded to exercise without difficulty or complaints.Will fax today's ECG tracings  to Dr. Brennan Bailey office for review.Barnet Pall, RN,BSN 04/19/2021 2:20 PM

## 2021-04-19 NOTE — Telephone Encounter (Signed)
Eugene Mccullough w/cardiac rehab at [facility] called stating she has strips for this Pt (she wanted to leave this message w/medical assistants & Dr. Terri Skains). Phone # is [phone].

## 2021-04-20 NOTE — Telephone Encounter (Signed)
Strips were obtain and patient's provider is aware

## 2021-04-21 ENCOUNTER — Other Ambulatory Visit: Payer: Self-pay

## 2021-04-21 ENCOUNTER — Encounter (HOSPITAL_COMMUNITY)
Admission: RE | Admit: 2021-04-21 | Discharge: 2021-04-21 | Disposition: A | Discharge status: Home / Self Care | Payer: 59 | Source: Ambulatory Visit | Attending: Cardiology | Admitting: Cardiology

## 2021-04-21 ENCOUNTER — Ambulatory Visit (HOSPITAL_COMMUNITY): Payer: 59

## 2021-04-21 DIAGNOSIS — Z951 Presence of aortocoronary bypass graft: Secondary | ICD-10-CM

## 2021-04-21 DIAGNOSIS — Z48812 Encounter for surgical aftercare following surgery on the circulatory system: Secondary | ICD-10-CM | POA: Diagnosis not present

## 2021-04-24 ENCOUNTER — Other Ambulatory Visit: Payer: Self-pay

## 2021-04-24 ENCOUNTER — Ambulatory Visit (HOSPITAL_COMMUNITY): Payer: 59

## 2021-04-24 ENCOUNTER — Encounter (HOSPITAL_COMMUNITY)
Admission: RE | Admit: 2021-04-24 | Discharge: 2021-04-24 | Disposition: A | Discharge status: Home / Self Care | Payer: 59 | Source: Ambulatory Visit | Attending: Cardiology | Admitting: Cardiology

## 2021-04-24 DIAGNOSIS — Z48812 Encounter for surgical aftercare following surgery on the circulatory system: Secondary | ICD-10-CM | POA: Diagnosis not present

## 2021-04-24 DIAGNOSIS — Z951 Presence of aortocoronary bypass graft: Secondary | ICD-10-CM

## 2021-04-25 NOTE — Progress Notes (Signed)
Reviewed home exercise Rx with patient. Encouraged warm-up, cool-down and stretching. Reviewed THRR of 64-128 and keeping RPE between 11-13. Pt encouraged to stay hydrated. Discussed weather parameters for temperature and humidity for safe exercise outdoors. Reviewed S/S to terminate exercise and when to call MD vs 911. Reviewed use of NTG and encouraged to carry at all times. Pt verbalized understanding of the home exercise Rx and was provided a copy.   Lesly Rubenstein MS, ACSM-CEP, CCRP

## 2021-04-26 ENCOUNTER — Encounter (HOSPITAL_COMMUNITY)
Admission: RE | Admit: 2021-04-26 | Discharge: 2021-04-26 | Disposition: A | Discharge status: Home / Self Care | Payer: 59 | Source: Ambulatory Visit | Attending: Cardiology | Admitting: Cardiology

## 2021-04-26 ENCOUNTER — Ambulatory Visit (HOSPITAL_COMMUNITY): Payer: 59

## 2021-04-26 ENCOUNTER — Other Ambulatory Visit: Payer: Self-pay

## 2021-04-26 DIAGNOSIS — Z951 Presence of aortocoronary bypass graft: Secondary | ICD-10-CM

## 2021-04-26 DIAGNOSIS — Z48812 Encounter for surgical aftercare following surgery on the circulatory system: Secondary | ICD-10-CM | POA: Diagnosis not present

## 2021-04-27 ENCOUNTER — Ambulatory Visit: Payer: 59 | Admitting: Cardiology

## 2021-04-27 ENCOUNTER — Encounter: Payer: Self-pay | Admitting: Cardiology

## 2021-04-27 VITALS — BP 119/81 | HR 66 | Temp 98.3°F | Resp 16 | Ht 66.0 in | Wt 184.0 lb

## 2021-04-27 DIAGNOSIS — I700 Atherosclerosis of aorta: Secondary | ICD-10-CM

## 2021-04-27 DIAGNOSIS — E782 Mixed hyperlipidemia: Secondary | ICD-10-CM

## 2021-04-27 DIAGNOSIS — I10 Essential (primary) hypertension: Secondary | ICD-10-CM

## 2021-04-27 DIAGNOSIS — Z87891 Personal history of nicotine dependence: Secondary | ICD-10-CM

## 2021-04-27 DIAGNOSIS — Z951 Presence of aortocoronary bypass graft: Secondary | ICD-10-CM

## 2021-04-27 DIAGNOSIS — I25119 Atherosclerotic heart disease of native coronary artery with unspecified angina pectoris: Secondary | ICD-10-CM

## 2021-04-27 NOTE — Progress Notes (Signed)
ID:  Eugene Mccullough, DOB 11-18-1960, MRN [id number]  PCP:  Malena Peer, MD  Cardiologist:  Rex Kras, DO, [facility] (established care 11/25/2020) Former Cardiology Providers: Dr. Lyman Bishop   Date: 04/27/21 Last Office Visit: 03/29/2021  Chief Complaint  Patient presents with   Coronary artery disease involving native coronary artery of   Follow-up   Chest Pain    HPI  Eugene Mccullough is a 60 y.o. African-American male who presents to the office with a chief complaint of " reevaluate chest pain ." Patient's past medical history and cardiovascular risk factors include: Multivessel CAD status post four-vessel bypass surgery, HTN, HLD, BPH, mild coronary artery calcification, 26-pack-year history of smoking (quit in 2014), obesity due to excess calories.  He is referred to the office at the request of Eugene Mccullough, Eugene Shaggy, MD for evaluation of chest pain.  Initially presented with chest pain/heartburn which were suggestive of CAD.  He underwent an ischemic evaluation and was noted to have multivessel CAD on his coronary CTA.  He subsequently underwent left heart catheterization followed by four-vessel bypass with Dr. [name] on Jan 10, 2021.  After his four-vessel bypass his chest tube output was high and therefore he underwent mediastinal reexploration the following day and the source was cauterized but he required 4 units of PRBC postoperatively.  Post CABG patient has implemented lifestyle changes and has lost weight, eating healthier, and also started cardiac rehab since last office visit.  Patient states that he participates in cardiac rehab for approximately 60 minutes several times a week.  However in addition to that he goes out for a walk at least 20 to 25 minutes on a daily basis and into his exercise routine when he gets to the 15 to 20-minute mark he starts having throat pain.  The pain intensity is 5 out of 10 and before the surgery the pain used to be 8 out of 10.  He does not  have any associated nausea, diaphoresis, near-syncope or syncopal events.  Patient has had a long history of heartburn and reflux and used to see gastroenterology but has not seen anyone for the last 1 year.  He has gone up on his PPI.  No use of sublingual nitroglycerin tablets.  He has gained approximately 3 pounds since last visit.  FUNCTIONAL STATUS: Cardiac rehab and walks 20-3mnutes daily.   ALLERGIES: Allergies  Allergen Reactions   Hydrocodone-Acetaminophen Itching   Oxycodone Itching    MEDICATION LIST PRIOR TO VISIT: Current Meds  Medication Sig   amLODipine (NORVASC) 5 MG tablet Take 1 tablet (5 mg total) by mouth daily. (Patient taking differently: Take 5 mg by mouth 2 (two) times daily.)   aspirin EC 81 MG tablet Take 1 tablet (81 mg total) by mouth daily. Swallow whole.   atorvastatin (LIPITOR) 40 MG tablet Take 1 tablet (40 mg total) by mouth at bedtime.   clopidogrel (PLAVIX) 75 MG tablet Take 1 tablet (75 mg total) by mouth daily.   finasteride (PROSCAR) 5 MG tablet TAKE 1 TABLET BY MOUTH EVERY DAY   metoprolol tartrate (LOPRESSOR) 25 MG tablet Take 1 tablet (25 mg total) by mouth 2 (two) times daily.   nitroGLYCERIN (NITROSTAT) 0.4 MG SL tablet PLACE 1 TABLET (0.4 MG TOTAL) UNDER THE TONGUE EVERY 5 (FIVE) MINUTES AS NEEDED FOR CHEST PAIN. IF YOU REQUIRE MORE THAN TWO TABLETS FIVE MINUTES APART GO TO THE NEAREST ER VIA EMS.   pantoprazole (PROTONIX) 40 MG tablet Take 40 mg by mouth 2 (  two) times daily.   tamsulosin (FLOMAX) 0.4 MG CAPS capsule Take 0.4 mg by mouth daily.     PAST MEDICAL HISTORY: Past Medical History:  Diagnosis Date   Anemia    Anginal pain (HCC)    Arthritis    BPH (benign prostatic hyperplasia)    Coronary artery disease    Eczema    Gastric ulcer    GERD (gastroesophageal reflux disease)    Gout    H. pylori infection    Hyperlipidemia    Hypertension    IBS (irritable bowel syndrome)    Pre-diabetes    Sleep apnea    Does not use  CPAP    PAST SURGICAL HISTORY: Past Surgical History:  Procedure Laterality Date   CARDIAC CATHETERIZATION     2022   CHOLECYSTECTOMY  2005   COLONOSCOPY  08/2010   CORONARY ARTERY BYPASS GRAFT N/A 01/10/2021   Procedure: CORONARY ARTERY BYPASS GRAFTING (CABG) X  FOUR ON PUMP USING LEFT INTERNAL MAMMARY ARTERY, LEFT RADIAL ARTERY, RIGHT ENDOSCOPIC GREATER SAPHENOUS VEIN HARVEST CONDUITS;  Surgeon: Lajuana Matte, MD;  Location: [place];  Service: Open Heart Surgery;  Laterality: N/A;   EXPLORATION POST OPERATIVE OPEN HEART N/A 01/11/2021   Procedure: , MEDIASTINAL WASHOUT;  Surgeon: Lajuana Matte, MD;  Location: [place];  Service: Open Heart Surgery;  Laterality: N/A;   LEFT HEART CATH AND CORONARY ANGIOGRAPHY N/A 12/21/2020   Procedure: LEFT HEART CATH AND CORONARY ANGIOGRAPHY;  Surgeon: Nigel Mormon, MD;  Location: [place] [facility];  Service: Cardiovascular;  Laterality: N/A;   RADIAL ARTERY HARVEST Left 01/10/2021   Procedure: RADIAL ARTERY HARVEST;  Surgeon: Lajuana Matte, MD;  Location: [place];  Service: Open Heart Surgery;  Laterality: Left;   TEE WITHOUT CARDIOVERSION N/A 01/10/2021   Procedure: TRANSESOPHAGEAL ECHOCARDIOGRAM (TEE);  Surgeon: Lajuana Matte, MD;  Location: [place];  Service: Open Heart Surgery;  Laterality: N/A;   UPPER GI ENDOSCOPY  2019   WISDOM TOOTH EXTRACTION      FAMILY HISTORY: The patient family history includes Diabetes in his brother and sister; Eating disorder in his daughter; Eczema in his son; Hyperlipidemia in his mother; Hypertension in his sister; Irritable bowel syndrome in his son; Obesity in his sister.  SOCIAL HISTORY:  The patient  reports that he quit smoking about 8 years ago. His smoking use included cigarettes. He has a 20.00 pack-year smoking history. He has never used smokeless tobacco. He reports that he does not drink alcohol and does not use drugs.  REVIEW OF SYSTEMS: Review of Systems  Constitutional:  Negative for chills and fever.  HENT:  Negative for hoarse voice and nosebleeds.   Eyes:  Negative for discharge, double vision and pain.  Cardiovascular:  Negative for chest pain, claudication, dyspnea on exertion, leg swelling, near-syncope, orthopnea, palpitations, paroxysmal nocturnal dyspnea and syncope.  Respiratory:  Negative for hemoptysis and shortness of breath.   Musculoskeletal:  Negative for muscle cramps and myalgias.  Gastrointestinal:  Negative for abdominal pain, constipation, diarrhea, heartburn, hematemesis, hematochezia, melena, nausea and vomiting.  Neurological:  Negative for dizziness and light-headedness.   PHYSICAL EXAM: Vitals with BMI 04/27/2021 04/27/2021 04/06/2021  Height - 5' 6" -  Weight - 184 lbs 179 lbs  BMI - 16.10 96.04  Systolic 540 981 -  Diastolic 81 82 -  Pulse 66 67 -    CONSTITUTIONAL: Well-developed and well-nourished. No acute distress.  SKIN: Skin is warm and dry. No rash noted. No  cyanosis. No pallor. No jaundice HEAD: Normocephalic and atraumatic.  EYES: No scleral icterus MOUTH/THROAT: Moist oral membranes.  NECK: No JVD present. No thyromegaly noted. No carotid bruits  LYMPHATIC: No visible cervical adenopathy.  CHEST Normal respiratory effort. No intercostal retractions.  Sternotomy site is healing well, incision site is clean dry and intact. LUNGS: Clear to auscultation bilaterally.  No stridor. No wheezes. No rales.  CARDIOVASCULAR: Regular, positive S1-S2, no murmurs rubs or gallops appreciated ABDOMINAL: No apparent ascites.  EXTREMITIES: No peripheral edema.  Left radial site healing well, clean dry and intact.  Right lower extremity harvesting site has some induration but no signs of infection. HEMATOLOGIC: No significant bruising NEUROLOGIC: Oriented to person, place, and time. Nonfocal. Normal muscle tone.  PSYCHIATRIC: Normal mood and affect. Normal behavior. Cooperative  CARDIAC DATABASE: Coronary artery bypass grafting  surgery: Jan 10, 2021 by Dr. [name] LIMA-LAD, Left radial artery - OM2 (proximal of diagonal vein graft), RSVG D1, and D2.   EKG: 03/29/2021: Normal sinus rhythm, 63 bpm, low voltage in the limb leads, without underlying ischemia or injury pattern.  Echocardiogram: 12/13/2020: Normal LV systolic function with visual EF 50-55%. Left ventricle cavity is normal in size. Mild left ventricular hypertrophy. Normal global wall motion. Indeterminate diastolic filling pattern, normal LAP. Left atrial cavity is severely dilated. Mild (Grade I) mitral regurgitation. Mild tricuspid regurgitation. No evidence of pulmonary hypertension. Mild pulmonic regurgitation. IVC is dilated with a respiratory response of >50%. No prior study for comparison.    Stress Testing: Nuclear stress test 05/01/2017: The left ventricular ejection fraction is normal (55-65%). Nuclear stress EF: 59%. Blood pressure demonstrated a normal response to exercise. There was no ST segment deviation noted during stress. Defect 1: There is a small defect of moderate severity present in the basal inferior location. This is a low risk study.   Low risk stress nuclear study with probable inferobasal thinning and no significant ischemia; EF 59 with normal wall motion.  Coronary CTA 12/14/2020: 1. Coronary calcium score of 24.6 AU. This was 70th percentile for age and sex matched control. 2. Normal coronary origin with right dominance. 3. CAD-RADS = 4. Severe stenosis (70-99%) at the proximal /mid LAD due to mixed plaque. First diagonal branch patent but diffuse disease within the ostial to proximal segment. Second diagonal branch is overall patent. Diffuse disease within the proximal to mid segment LCx. RCA without evidence of plaque or stenosis. 4. Study is sent for CT-FFR to further evaluate the LAD, D1, and LCx. Findings will be performed and reported separately.  Noncardiac impressions: 1. No acute findings in the imaged  extracardiac chest. 2. Moderate hiatal hernia. 3. Mild hepatic steatosis. 4. Aortic Atherosclerosis (ICD10-I70.0).  Coronary CT FFR 12/15/2020: CT FFR analysis showed significant stenosis at mid LAD (modeled as total occlusion), mid-to-distal LCX, and acute marginal modeled as total occlusion.  Heart Catheterization: 12/21/2020: LM: Normal LAD: Mid 100% occlusion after Diag 2         Collaterals to distal LAD from Diag 2 and RPDA         High Diag 1 patent         Diag 2 diffuse 60-70% disease LCx: Proximal tandem 75% stenosis        Undefilled and sub-totally occluded OM1        Mid LCx 7% stenosis RCA: Mild luminal irregularities   Attempted wiring of LAD to assess chronicity of LAD occlusion. Based on wire behavior, appears to be chronic   Continue optimal medical  management for now. Fortunately, he does not have any LM involvement and EF is normal. Any revascularization recommendation will be for symptomatic improvement.  In addition to optimal medical therapy, following are the options for revascularization   #1. CABG to distal LAD, ?diag2, OM1-if graftable #2. PCI to LCx +/- OM1, stage LAD CTO PCI   CABG more likely to achieve complete revascularization with less procedures. Will continue outpatient discussion re: optimal revascularization   LABORATORY DATA: CBC Latest Ref Rng & Units 01/14/2021 01/12/2021 01/11/2021  WBC 4.0 - 10.5 K/uL 6.2 8.5 7.5  Hemoglobin 13.0 - 17.0 g/dL 10.2(L) 10.3(L) 10.2(L)  Hematocrit 39.0 - 52.0 % 30.3(L) 31.1(L) 29.7(L)  Platelets 150 - 400 K/uL 120(L) 90(L) 90(L)    CMP Latest Ref Rng & Units 02/21/2021 01/14/2021 01/13/2021  Glucose 65 - 99 mg/dL 80 83 100(H)  BUN 8 - 27 mg/dL _0 Creatinine 0.76 - 1.27 mg/dL 1.12 1.00 0.83  Sodium 134 - 144 mmol/L 137 131(L) 131(L)  Potassium 3.5 - 5.2 mmol/L 4.2 4.0 3.8  Chloride 96 - 106 mmol/L 102 102 102  CO2 20 - 29 mmol/L 23 21(L) 26  Calcium 8.6 - 10.2 mg/dL 10.1 8.5(L) 8.4(L)  Total Protein  6.0 - 8.5 g/dL 7.4 - -  Total Bilirubin 0.0 - 1.2 mg/dL 0.5 - -  Alkaline Phos 44 - 121 IU/L 108 - -  AST 0 - 40 IU/L 15 - -  ALT 0 - 44 IU/L 14 - -    Lipid Panel  Lab Results  Component Value Date   CHOL 97 (L) 02/21/2021   HDL 31 (L) 02/21/2021   LDLCALC 47 02/21/2021   LDLDIRECT 52 02/21/2021   TRIG 98 02/21/2021   CHOLHDL 4 07/02/2018    No components found for: NTPROBNP Recent Labs    12/19/20 1122  PROBNP 99   No results for input(s): TSH in the last 8760 hours.  BMP Recent Labs    01/12/21 0444 01/13/21 1014 01/14/21 0326 02/21/21 0900  NA 135 131* 131* 137  K 3.8 3.8 4.0 4.2  CL 107 102 102 102  CO2 22 26 21* 23  GLUCOSE 107* 100* 83 80  BUN _1 CREATININE 0.86 0.83 1.00 1.12  CALCIUM 8.4* 8.4* 8.5* 10.1  GFRNONAA >60 >60 >60  --     HEMOGLOBIN A1C Lab Results  Component Value Date   HGBA1C 5.3 01/06/2021   MPG 105.41 01/06/2021   External Labs: Collected: 11/09/2020, received from PCP Hemoglobin 15.8 g/dL, hematocrit 48% Platelets 143 Creatinine 0.93 mg/dL. eGFR: 103 mL/min per 1.73 m Potassium 4.9 AST 23, ALT 40, alkaline phosphatase 108 Lipid profile: Total cholesterol 191, triglycerides 91, HDL 47, LDL 125, non-HDL 144 Hemoglobin A1c: 5.3 TSH: 1.07  IMPRESSION:    ICD-10-CM   1. Coronary artery disease involving native coronary artery of native heart with angina pectoris ([place])  I25.119 PCV MYOCARDIAL PERFUSION WO [place]    2. Hx of CABG (CABG X 4.  LIMA-LAD, Left radial artery - OM2 (proximal of diagonal vein graft), RSVG D1, and D2).  Z95.1 PCV MYOCARDIAL PERFUSION WO [place]    3. Atherosclerosis of aorta (HCC)  I70.0     4. Essential hypertension  I10     5. Mixed hyperlipidemia  E78.2     6. Former smoker  Z87.891        RECOMMENDATIONS: Eugene Mccullough is a 60 y.o. male whose past medical history and cardiac risk factors include:  Multivessel CAD status post four-vessel bypass surgery, HTN, HLD, BPH, mild  coronary artery calcification, aortic atherosclerosis, 26-pack-year history of smoking (quit in 2014), obesity due to excess calories.  Coronary artery disease involving native coronary artery of native heart with angina pectoris [facility]) Status post four-vessel bypass 01/10/2021 and mediastinal exploration 01/11/2021. Denies any precordial pain. But has noticed exertional throat pain with exertional related activities but less intense compared to before. Currently on dual antiplatelet therapy No use of sublingual nitroglycerin tablets Continue beta-blockers. Continue cardiac rehab. Since patient continues to have throat pain since his bypass surgery I recommended that he reestablish care with gastroenterology to rule out progression of his underlying heartburn/GERD.  Shared decision was also to proceed with exercise nuclear stress test to evaluate for reversible ischemia and exercise-induced arrhythmia.  Hx of CABG (CABG X 4.  LIMA-LAD, Left radial artery - OM2 (proximal of diagonal vein graft), RSVG D1, and D2). See above. Secondary prevention.  Atherosclerosis of aorta (HCC) Continue statin therapy, LDL currently at goal.  Essential hypertension Office blood pressures are within excellent control Continue current medical therapy. Low-salt diet recommended.  Mixed hyperlipidemia Continue atorvastatin. Most recent LDL <70 mg/dL.  Former smoker Educated on the importance of continued smoking cessation.  FINAL MEDICATION LIST END OF ENCOUNTER: No orders of the defined types were placed in this encounter.    Current Outpatient Medications:    amLODipine (NORVASC) 5 MG tablet, Take 1 tablet (5 mg total) by mouth daily. (Patient taking differently: Take 5 mg by mouth 2 (two) times daily.), Disp: 90 tablet, Rfl: 1   aspirin EC 81 MG tablet, Take 1 tablet (81 mg total) by mouth daily. Swallow whole., Disp: 90 tablet, Rfl: 3   atorvastatin (LIPITOR) 40 MG tablet, Take 1 tablet (40 mg total)  by mouth at bedtime., Disp: 90 tablet, Rfl: 1   clopidogrel (PLAVIX) 75 MG tablet, Take 1 tablet (75 mg total) by mouth daily., Disp: 90 tablet, Rfl: 3   finasteride (PROSCAR) 5 MG tablet, TAKE 1 TABLET BY MOUTH EVERY DAY, Disp: 90 tablet, Rfl: 1   metoprolol tartrate (LOPRESSOR) 25 MG tablet, Take 1 tablet (25 mg total) by mouth 2 (two) times daily., Disp: 60 tablet, Rfl: 2   nitroGLYCERIN (NITROSTAT) 0.4 MG SL tablet, PLACE 1 TABLET (0.4 MG TOTAL) UNDER THE TONGUE EVERY 5 (FIVE) MINUTES AS NEEDED FOR CHEST PAIN. IF YOU REQUIRE MORE THAN TWO TABLETS FIVE MINUTES APART GO TO THE NEAREST ER VIA EMS., Disp: 25 tablet, Rfl: 0   pantoprazole (PROTONIX) 40 MG tablet, Take 40 mg by mouth 2 (two) times daily., Disp: , Rfl:    tamsulosin (FLOMAX) 0.4 MG CAPS capsule, Take 0.4 mg by mouth daily., Disp: , Rfl:   Orders Placed This Encounter  Procedures   PCV MYOCARDIAL PERFUSION WO [place]     Patient Instructions  Please hold metoprolol tartrate 2 days prior to the stress test.   --Continue cardiac medications as reconciled in final medication list. --Return in about 4 weeks (around 05/25/2021) for Follow up, CAD, Review test results. Or sooner if needed. --Continue follow-up with your primary care physician regarding the management of your other chronic comorbid conditions.  Patient's questions and concerns were addressed to his satisfaction. He voices understanding of the instructions provided during this encounter.   This note was created using a voice recognition software as a result there may be grammatical errors inadvertently enclosed that do not reflect the nature of this encounter. Every attempt is made to  correct such errors.  Rex Kras, [place], [facility]  Pager: [phone] Office: [phone]

## 2021-04-27 NOTE — Patient Instructions (Signed)
Please hold metoprolol tartrate 2 days prior to the stress test.

## 2021-04-28 ENCOUNTER — Ambulatory Visit (HOSPITAL_COMMUNITY): Payer: 59

## 2021-04-28 ENCOUNTER — Encounter (HOSPITAL_COMMUNITY): Payer: 59

## 2021-05-03 ENCOUNTER — Ambulatory Visit (HOSPITAL_COMMUNITY): Payer: 59

## 2021-05-03 ENCOUNTER — Other Ambulatory Visit: Payer: Self-pay

## 2021-05-03 ENCOUNTER — Encounter (HOSPITAL_COMMUNITY)
Admission: RE | Admit: 2021-05-03 | Discharge: 2021-05-03 | Disposition: A | Discharge status: Home / Self Care | Payer: 59 | Source: Ambulatory Visit | Attending: Cardiology | Admitting: Cardiology

## 2021-05-03 DIAGNOSIS — Z951 Presence of aortocoronary bypass graft: Secondary | ICD-10-CM | POA: Diagnosis present

## 2021-05-05 ENCOUNTER — Ambulatory Visit (HOSPITAL_COMMUNITY): Payer: 59

## 2021-05-05 ENCOUNTER — Encounter (HOSPITAL_COMMUNITY)
Admission: RE | Admit: 2021-05-05 | Discharge: 2021-05-05 | Disposition: A | Discharge status: Home / Self Care | Payer: 59 | Source: Ambulatory Visit | Attending: Cardiology | Admitting: Cardiology

## 2021-05-05 ENCOUNTER — Other Ambulatory Visit: Payer: Self-pay

## 2021-05-05 DIAGNOSIS — Z951 Presence of aortocoronary bypass graft: Secondary | ICD-10-CM | POA: Diagnosis not present

## 2021-05-08 ENCOUNTER — Encounter (HOSPITAL_COMMUNITY): Payer: 59

## 2021-05-08 ENCOUNTER — Ambulatory Visit (HOSPITAL_COMMUNITY): Payer: 59

## 2021-05-09 NOTE — Progress Notes (Signed)
Cardiac Individual Treatment Plan  Patient Details  Name: Eugene Mccullough MRN: [id number] Date of Birth: 08/07/1961 Referring Provider:   Flowsheet Row CARDIAC REHAB PHASE II ORIENTATION from 03/30/2021 in [place]  Referring Provider Rex Kras, MD       Initial Encounter Date:  [place] PHASE II ORIENTATION from 03/30/2021 in [place]  Date 03/30/21       Visit Diagnosis:  01/10/21 S/P CABG x 4  Patient's Home Medications on Admission:  Current Outpatient Medications:    amLODipine (NORVASC) 5 MG tablet, Take 1 tablet (5 mg total) by mouth daily. (Patient taking differently: Take 5 mg by mouth 2 (two) times daily.), Disp: 90 tablet, Rfl: 1   aspirin EC 81 MG tablet, Take 1 tablet (81 mg total) by mouth daily. Swallow whole., Disp: 90 tablet, Rfl: 3   atorvastatin (LIPITOR) 40 MG tablet, Take 1 tablet (40 mg total) by mouth at bedtime., Disp: 90 tablet, Rfl: 1   clopidogrel (PLAVIX) 75 MG tablet, Take 1 tablet (75 mg total) by mouth daily., Disp: 90 tablet, Rfl: 3   finasteride (PROSCAR) 5 MG tablet, TAKE 1 TABLET BY MOUTH EVERY DAY, Disp: 90 tablet, Rfl: 1   metoprolol tartrate (LOPRESSOR) 25 MG tablet, Take 1 tablet (25 mg total) by mouth 2 (two) times daily., Disp: 60 tablet, Rfl: 2   nitroGLYCERIN (NITROSTAT) 0.4 MG SL tablet, PLACE 1 TABLET (0.4 MG TOTAL) UNDER THE TONGUE EVERY 5 (FIVE) MINUTES AS NEEDED FOR CHEST PAIN. IF YOU REQUIRE MORE THAN TWO TABLETS FIVE MINUTES APART GO TO THE NEAREST ER VIA EMS., Disp: 25 tablet, Rfl: 0   pantoprazole (PROTONIX) 40 MG tablet, Take 40 mg by mouth 2 (two) times daily., Disp: , Rfl:    tamsulosin (FLOMAX) 0.4 MG CAPS capsule, Take 0.4 mg by mouth daily., Disp: , Rfl:   Past Medical History: Past Medical History:  Diagnosis Date   Anemia    Anginal pain (HCC)    Arthritis    BPH (benign prostatic hyperplasia)    Coronary artery disease    Eczema     Gastric ulcer    GERD (gastroesophageal reflux disease)    Gout    H. pylori infection    Hyperlipidemia    Hypertension    IBS (irritable bowel syndrome)    Pre-diabetes    Sleep apnea    Does not use CPAP    Tobacco Use: Social History   Tobacco Use  Smoking Status Former   Packs/day: 1.00   Years: 20.00   Pack years: 20.00   Types: Cigarettes   Quit date: 2014   Years since quitting: 8.7  Smokeless Tobacco Never    Labs: Recent Review Scientist, physiological     Labs for ITP Cardiac and Pulmonary Rehab Latest Ref Rng & Units 01/11/2021 01/11/2021 01/11/2021 01/11/2021 02/21/2021   Cholestrol 100 - 199 mg/dL - - - - 97(L)   LDLCALC 0 - 99 mg/dL - - - - 47   LDLDIRECT 0 - 99 mg/dL - - - - 52   HDL >39 mg/dL - - - - 31(L)   Trlycerides 0 - 149 mg/dL - - - - 98   Hemoglobin A1c 4.8 - 5.6 % - - - - -   PHART 7.350 - 7.450 7.418 7.358 7.390 7.401 -   PCO2ART 32.0 - 48.0 mmHg 31.3(L) 40.6 35.0 33.4 -   HCO3 20.0 - 28.0 mmol/L 20.2 22.9 21.2  20.7 -   TCO2 22 - 32 mmol/L 21(L) _0 -   ACIDBASEDEF 0.0 - 2.0 mmol/L 4.0(H) 2.0 3.0(H) 3.0(H) -   O2SAT % 94.0 90.0 90.0 89.0 -       Capillary Blood Glucose: Lab Results  Component Value Date   GLUCAP 100 (H) 01/14/2021   GLUCAP 102 (H) 01/13/2021   GLUCAP 103 (H) 01/13/2021   GLUCAP 96 01/13/2021   GLUCAP 101 (H) 01/13/2021     Exercise Target Goals: Exercise Program Goal: Individual exercise prescription set using results from initial 6 min walk test and THRR while considering  patient's activity barriers and safety.   Exercise Prescription Goal: Starting with aerobic activity 30 plus minutes a day, 3 days per week for initial exercise prescription. Provide home exercise prescription and guidelines that participant acknowledges understanding prior to discharge.  Activity Barriers & Risk Stratification:  Activity Barriers & Cardiac Risk Stratification - 03/30/21 1519       Activity Barriers & Cardiac Risk Stratification    Activity Barriers Back Problems;Neck/Spine Problems    Cardiac Risk Stratification High             6 Minute Walk:  6 Minute Walk     Row Name 03/30/21 1313         6 Minute Walk   Phase Initial     Distance 1436 feet     Walk Time 6 minutes     # of Rest Breaks 0     MPH 2.72     METS 3.4     RPE 11     Perceived Dyspnea  0     VO2 Peak 11.9     Symptoms No     Resting HR 65 bpm     Resting BP 110/70     Resting Oxygen Saturation  99 %     Exercise Oxygen Saturation  during 6 min walk 98 %     Max Ex. HR 84 bpm     Max Ex. BP 120/72     2 Minute Post BP 112/64              Oxygen Initial Assessment:   Oxygen Re-Evaluation:   Oxygen Discharge (Final Oxygen Re-Evaluation):   Initial Exercise Prescription:  Initial Exercise Prescription - 03/30/21 1500       Date of Initial Exercise RX and Referring Provider   Date 03/30/21    Referring Provider Rex Kras, MD    Expected Discharge Date 05/26/21      Bike   Level 2    Minutes 15    METs 30      NuStep   Level 2    SPM 85    Minutes 15    METs 2.4      Prescription Details   Frequency (times per week) 3    Duration Progress to 30 minutes of continuous aerobic without signs/symptoms of physical distress      Intensity   THRR 40-80% of Max Heartrate 64-128    Ratings of Perceived Exertion 11-13    Perceived Dyspnea 0-4      Progression   Progression Continue progressive overload as per policy without signs/symptoms or physical distress.      Resistance Training   Training Prescription Yes    Weight 5 lbs    Reps 10-15             Perform Capillary Blood Glucose checks as needed.  Exercise Prescription Changes:  Exercise Prescription Changes     Row Name 04/03/21 1331 04/21/21 1500 05/05/21 1500         Response to Exercise   Blood Pressure (Admit) 98/60 102/60 118/66     Blood Pressure (Exercise) 130/72 120/72 118/56     Blood Pressure (Exit) 98/70 104/60 98/70      Heart Rate (Admit) 86 bpm 82 bpm 81 bpm     Heart Rate (Exercise) 92 bpm 101 bpm 91 bpm     Heart Rate (Exit) 73 bpm 76 bpm 69 bpm     Rating of Perceived Exertion (Exercise) _0 Symptoms None None None     Comments Off to a good start with exercise. Reviewed MET's and home exercise Rx Reviewed METs and Goals.     Duration Continue with 30 min of aerobic exercise without signs/symptoms of physical distress. Continue with 30 min of aerobic exercise without signs/symptoms of physical distress. Continue with 30 min of aerobic exercise without signs/symptoms of physical distress.     Intensity THRR unchanged THRR unchanged THRR unchanged           Progression   Progression Continue to progress workloads to maintain intensity without signs/symptoms of physical distress. Continue to progress workloads to maintain intensity without signs/symptoms of physical distress. Continue to progress workloads to maintain intensity without signs/symptoms of physical distress.     Average METs 2.6 3.55 3.6           Resistance Training   Training Prescription Yes Yes Yes     Weight 5 lbs 5 lbs 5 lbs     Reps 10-15 10-15 10-15     Time 10 Minutes 10 Minutes 10 Minutes           Interval Training   Interval Training No No No           Bike   Level 2 2.7 3     Minutes _1 METs 3 4.4 4.3           NuStep   Level _2 SPM 85 85 85     Minutes _3 METs 2.2 2.7 2.9           Home Exercise Plan   Plans to continue exercise at -- Home (comment) Home (comment)     Frequency -- Add 3 additional days to program exercise sessions. Add 3 additional days to program exercise sessions.     Initial Home Exercises Provided -- 04/21/21 04/21/21              Exercise Comments:   Exercise Comments     Row Name 04/03/21 1425 04/21/21 1500 05/05/21 1510       Exercise Comments Patient tolerated exercise well without symptoms. Oriented patient to weights without  difficulty. Reviewed home exercise Rx with patient. Pt verbalized understanding of the home exercise Rx and was provided a copy. Reviewed METs and goals. Pt is improving his strength and stamina in his workouts here and has increased his walk time at home to 30 minutes.              Exercise Goals and Review:   Exercise Goals     Row Name 03/30/21 1520             Exercise Goals   Increase Physical Activity Yes       Intervention  Provide advice, education, support and counseling about physical activity/exercise needs.;Develop an individualized exercise prescription for aerobic and resistive training based on initial evaluation findings, risk stratification, comorbidities and participant's personal goals.       Expected Outcomes Long Term: Add in home exercise to make exercise part of routine and to increase amount of physical activity.;Long Term: Exercising regularly at least 3-5 days a week.;Short Term: Attend rehab on a regular basis to increase amount of physical activity.       Increase Strength and Stamina Yes       Intervention Provide advice, education, support and counseling about physical activity/exercise needs.;Develop an individualized exercise prescription for aerobic and resistive training based on initial evaluation findings, risk stratification, comorbidities and participant's personal goals.       Expected Outcomes Short Term: Increase workloads from initial exercise prescription for resistance, speed, and METs.;Short Term: Perform resistance training exercises routinely during rehab and add in resistance training at home;Long Term: Improve cardiorespiratory fitness, muscular endurance and strength as measured by increased METs and functional capacity (6MWT)       Able to understand and use rate of perceived exertion (RPE) scale Yes       Intervention Provide education and explanation on how to use RPE scale       Expected Outcomes Short Term: Able to use RPE daily in  rehab to express subjective intensity level;Long Term:  Able to use RPE to guide intensity level when exercising independently       Knowledge and understanding of Target Heart Rate Range (THRR) Yes       Intervention Provide education and explanation of THRR including how the numbers were predicted and where they are located for reference       Expected Outcomes Short Term: Able to state/look up THRR;Short Term: Able to use daily as guideline for intensity in rehab;Long Term: Able to use THRR to govern intensity when exercising independently       Understanding of Exercise Prescription Yes       Intervention Provide education, explanation, and written materials on patient's individual exercise prescription       Expected Outcomes Short Term: Able to explain program exercise prescription;Long Term: Able to explain home exercise prescription to exercise independently                Exercise Goals Re-Evaluation :  Exercise Goals Re-Evaluation     Row Name 04/03/21 1425 04/21/21 1500 05/05/21 1500         Exercise Goal Re-Evaluation   Exercise Goals Review Increase Physical Activity;Able to understand and use rate of perceived exertion (RPE) scale Increase Physical Activity;Increase Strength and Stamina;Able to understand and use rate of perceived exertion (RPE) scale;Knowledge and understanding of Target Heart Rate Range (THRR);Able to check pulse independently;Understanding of Exercise Prescription Increase Physical Activity;Increase Strength and Stamina;Able to understand and use rate of perceived exertion (RPE) scale;Knowledge and understanding of Target Heart Rate Range (THRR);Able to check pulse independently     Comments Patient able to understand and use RPE scale appropriately. Reviewed METs and home exercise Rx. Pt walks at home most days 20 minutes. Will increase to 30 minutes. Pt is making good progress toward goals. Pt returns to work on Monday. Pt has a goal of weight loss but has  not lost any signifiicant amount of weight.     Expected Outcomes Progress workloads as tolerated to help achieve personal health and fitness goals. Pt will continue to walk at home on his  own. Will continue to montior and progress exercise workloads as tolerated.               Discharge Exercise Prescription (Final Exercise Prescription Changes):  Exercise Prescription Changes - 05/05/21 1500       Response to Exercise   Blood Pressure (Admit) 118/66    Blood Pressure (Exercise) 118/56    Blood Pressure (Exit) 98/70    Heart Rate (Admit) 81 bpm    Heart Rate (Exercise) 91 bpm    Heart Rate (Exit) 69 bpm    Rating of Perceived Exertion (Exercise) 12    Symptoms None    Comments Reviewed METs and Goals.    Duration Continue with 30 min of aerobic exercise without signs/symptoms of physical distress.    Intensity THRR unchanged      Progression   Progression Continue to progress workloads to maintain intensity without signs/symptoms of physical distress.    Average METs 3.6      Resistance Training   Training Prescription Yes    Weight 5 lbs    Reps 10-15    Time 10 Minutes      Interval Training   Interval Training No      Bike   Level 3    Minutes 15    METs 4.3      NuStep   Level 4    SPM 85    Minutes 15    METs 2.9      Home Exercise Plan   Plans to continue exercise at Home (comment)    Frequency Add 3 additional days to program exercise sessions.    Initial Home Exercises Provided 04/21/21             Nutrition:  Target Goals: Understanding of nutrition guidelines, daily intake of sodium <1560m, cholesterol <2047m calories 30% from fat and 7% or less from saturated fats, daily to have 5 or more servings of fruits and vegetables.  Biometrics:  Pre Biometrics - 03/30/21 1505       Pre Biometrics   Waist Circumference 38.5 inches    Hip Circumference 42.5 inches    Waist to Hip Ratio 0.91 %    Triceps Skinfold 12 mm    % Body Fat 26.1 %     Grip Strength 33 kg    Flexibility 16.75 in    Single Leg Stand 30 seconds              Nutrition Therapy Plan and Nutrition Goals:  Nutrition Therapy & Goals - 04/06/21 1341       Nutrition Therapy   Diet TLC    Drug/Food Interactions Statins/Certain Fruits      Personal Nutrition Goals   Nutrition Goal Pt to identify food quantities necessary to achieve weight loss of 6-24 lb at graduation from cardiac rehab.    Personal Goal #2 Pt to build a healthy plate including vegetables, fruits, whole grains, and low-fat dairy products in a heart healthy meal plan      Intervention Plan   Intervention Prescribe, educate and counsel regarding individualized specific dietary modifications aiming towards targeted core components such as weight, hypertension, lipid management, diabetes, heart failure and other comorbidities.;Nutrition handout(s) given to patient.    Expected Outcomes Long Term Goal: Adherence to prescribed nutrition plan.;Short Term Goal: Understand basic principles of dietary content, such as calories, fat, sodium, cholesterol and nutrients.             Nutrition Assessments:  MEDIFICTS Score Key: ?  70 Need to make dietary changes  40-70 Heart Healthy Diet ? 40 Therapeutic Level Cholesterol Diet  Flowsheet Row CARDIAC REHAB PHASE II EXERCISE from 04/03/2021 in [place]  Picture Your Plate Total Score on Admission 72      Picture Your Plate Scores: <05 Unhealthy dietary pattern with much room for improvement. 41-50 Dietary pattern unlikely to meet recommendations for good health and room for improvement. 51-60 More healthful dietary pattern, with some room for improvement.  >60 Healthy dietary pattern, although there may be some specific behaviors that could be improved.    Nutrition Goals Re-Evaluation:  Nutrition Goals Re-Evaluation     [place] Name 04/06/21 1341 05/08/21 0956           Goals   Current Weight 179 lb  (81.2 kg) 181 lb 7 oz (82.3 kg)      Nutrition Goal -- Pt to identify food quantities necessary to achieve weight loss of 6-24 lb at graduation from cardiac rehab.             Personal Goal #2 Re-Evaluation   Personal Goal #2 -- Pt to build a healthy plate including vegetables, fruits, whole grains, and low-fat dairy products in a heart healthy meal plan               Nutrition Goals Discharge (Final Nutrition Goals Re-Evaluation):  Nutrition Goals Re-Evaluation - 05/08/21 0956       Goals   Current Weight 181 lb 7 oz (82.3 kg)    Nutrition Goal Pt to identify food quantities necessary to achieve weight loss of 6-24 lb at graduation from cardiac rehab.      Personal Goal #2 Re-Evaluation   Personal Goal #2 Pt to build a healthy plate including vegetables, fruits, whole grains, and low-fat dairy products in a heart healthy meal plan             Psychosocial: Target Goals: Acknowledge presence or absence of significant depression and/or stress, maximize coping skills, provide positive support system. Participant is able to verbalize types and ability to use techniques and skills needed for reducing stress and depression.  Initial Review & Psychosocial Screening:  Initial Psych Review & Screening - 03/30/21 1520       Initial Review   Current issues with None Identified      Family Dynamics   Good Support System? Yes   Niki has his wife and children for support     Barriers   Psychosocial barriers to participate in program There are no identifiable barriers or psychosocial needs.      Screening Interventions   Interventions Encouraged to exercise             Quality of Life Scores:  Quality of Life - 03/30/21 1508       Quality of Life   Select Quality of Life      Quality of Life Scores   Health/Function Pre 26.93 %    Socioeconomic Pre 26.81 %    Psych/Spiritual Pre 28.79 %    Family Pre 26.4 %    GLOBAL Pre 27.2 %            Scores of 19 and  below usually indicate a poorer quality of life in these areas.  A difference of  2-3 points is a clinically meaningful difference.  A difference of 2-3 points in the total score of the Quality of Life Index has been associated with significant improvement in overall  quality of life, self-image, physical symptoms, and general health in studies assessing change in quality of life.  PHQ-9: Recent Review Flowsheet Data     Depression screen [facility] 2/9 03/30/2021 03/30/2021 12/05/2017 06/13/2017   Decreased Interest 0 0 0 0   Down, Depressed, Hopeless 0 0 0 0   PHQ - 2 Score 0 0 0 0   Altered sleeping - - 0 -   Tired, decreased energy - - 1 -   Change in appetite - - 0 -   Feeling bad or failure about yourself  - - 0 -   Trouble concentrating - - 0 -   Moving slowly or fidgety/restless - - 0 -   Suicidal thoughts - - 0 -   PHQ-9 Score - - 1 -      Interpretation of Total Score  Total Score Depression Severity:  1-4 = Minimal depression, 5-9 = Mild depression, 10-14 = Moderate depression, 15-19 = Moderately severe depression, 20-27 = Severe depression   Psychosocial Evaluation and Intervention:   Psychosocial Re-Evaluation:  Psychosocial Re-Evaluation     [place] Name 04/03/21 1622 04/11/21 1309 05/09/21 1543         Psychosocial Re-Evaluation   Current issues with None Identified None Identified None Identified     Interventions Encouraged to attend Cardiac Rehabilitation for the exercise Encouraged to attend Cardiac Rehabilitation for the exercise Encouraged to attend Cardiac Rehabilitation for the exercise     Continue Psychosocial Services  No Follow up required No Follow up required No Follow up required              Psychosocial Discharge (Final Psychosocial Re-Evaluation):  Psychosocial Re-Evaluation - 05/09/21 1543       Psychosocial Re-Evaluation   Current issues with None Identified    Interventions Encouraged to attend Cardiac Rehabilitation for the exercise    Continue  Psychosocial Services  No Follow up required             Vocational Rehabilitation: Provide vocational rehab assistance to qualifying candidates.   Vocational Rehab Evaluation & Intervention:  Vocational Rehab - 03/30/21 1521       Initial Vocational Rehab Evaluation & Intervention   Assessment shows need for Vocational Rehabilitation No             Education: Education Goals: Education classes will be provided on a weekly basis, covering required topics. Participant will state understanding/return demonstration of topics presented.  Learning Barriers/Preferences:  Learning Barriers/Preferences - 03/30/21 1511       Learning Barriers/Preferences   Learning Barriers Sight;Hearing   wears glasses, has ringing in the ears   Learning Preferences Audio;Written Material;Computer/Internet;Group Instruction;Individual Instruction;Pictoral;Skilled Demonstration;Verbal Instruction;Video             Education Topics: Hypertension, Hypertension Reduction -Define heart disease and high blood pressure. Discus how high blood pressure affects the body and ways to reduce high blood pressure.   Exercise and Your Heart -Discuss why it is important to exercise, the FITT principles of exercise, normal and abnormal responses to exercise, and how to exercise safely.   Angina -Discuss definition of angina, causes of angina, treatment of angina, and how to decrease risk of having angina.   Cardiac Medications -Review what the following cardiac medications are used for, how they affect the body, and side effects that may occur when taking the medications.  Medications include Aspirin, Beta blockers, calcium channel blockers, ACE Inhibitors, angiotensin receptor blockers, diuretics, digoxin, and antihyperlipidemics.   Congestive Heart Failure -  Discuss the definition of CHF, how to live with CHF, the signs and symptoms of CHF, and how keep track of weight and sodium intake.   Heart  Disease and Intimacy -Discus the effect sexual activity has on the heart, how changes occur during intimacy as we age, and safety during sexual activity.   Smoking Cessation / COPD -Discuss different methods to quit smoking, the health benefits of quitting smoking, and the definition of COPD.   Nutrition I: Fats -Discuss the types of cholesterol, what cholesterol does to the heart, and how cholesterol levels can be controlled.   Nutrition II: Labels -Discuss the different components of food labels and how to read food label   Heart Parts/Heart Disease and PAD -Discuss the anatomy of the heart, the pathway of blood circulation through the heart, and these are affected by heart disease.   Stress I: Signs and Symptoms -Discuss the causes of stress, how stress may lead to anxiety and depression, and ways to limit stress.   Stress II: Relaxation -Discuss different types of relaxation techniques to limit stress.   Warning Signs of Stroke / TIA -Discuss definition of a stroke, what the signs and symptoms are of a stroke, and how to identify when someone is having stroke.   Knowledge Questionnaire Score:  Knowledge Questionnaire Score - 03/30/21 1509       Knowledge Questionnaire Score   Pre Score 21/24             Core Components/Risk Factors/Patient Goals at Admission:  Personal Goals and Risk Factors at Admission - 03/30/21 1509       Core Components/Risk Factors/Patient Goals on Admission    Weight Management Yes;Weight Loss    Intervention Weight Management: Develop a combined nutrition and exercise program designed to reach desired caloric intake, while maintaining appropriate intake of nutrient and fiber, sodium and fats, and appropriate energy expenditure required for the weight goal.;Weight Management: Provide education and appropriate resources to help participant work on and attain dietary goals.;Weight Management/Obesity: Establish reasonable short term and long  term weight goals.    Admit Weight 178 lb 5.6 oz (80.9 kg)    Expected Outcomes Short Term: Continue to assess and modify interventions until short term weight is achieved;Long Term: Adherence to nutrition and physical activity/exercise program aimed toward attainment of established weight goal;Weight Maintenance: Understanding of the daily nutrition guidelines, which includes 25-35% calories from fat, 7% or less cal from saturated fats, less than 264m cholesterol, less than 1.5gm of sodium, & 5 or more servings of fruits and vegetables daily;Weight Loss: Understanding of general recommendations for a balanced deficit meal plan, which promotes 1-2 lb weight loss per week and includes a negative energy balance of [phone] kcal/d;Understanding recommendations for meals to include 15-35% energy as protein, 25-35% energy from fat, 35-60% energy from carbohydrates, less than 209mof dietary cholesterol, 20-35 gm of total fiber daily;Understanding of distribution of calorie intake throughout the day with the consumption of 4-5 meals/snacks    Hypertension Yes    Intervention Provide education on lifestyle modifcations including regular physical activity/exercise, weight management, moderate sodium restriction and increased consumption of fresh fruit, vegetables, and low fat dairy, alcohol moderation, and smoking cessation.;Monitor prescription use compliance.    Expected Outcomes Short Term: Continued assessment and intervention until BP is < 140/9066mG in hypertensive participants. < 130/68m53m in hypertensive participants with diabetes, heart failure or chronic kidney disease.;Long Term: Maintenance of blood pressure at goal levels.    Lipids Yes  Intervention Provide education and support for participant on nutrition & aerobic/resistive exercise along with prescribed medications to achieve LDL <53m, HDL >429m    Expected Outcomes Short Term: Participant states understanding of desired cholesterol values  and is compliant with medications prescribed. Participant is following exercise prescription and nutrition guidelines.;Long Term: Cholesterol controlled with medications as prescribed, with individualized exercise RX and with personalized nutrition plan. Value goals: LDL < 7033mHDL > 40 mg.             Core Components/Risk Factors/Patient Goals Review:   Goals and Risk Factor Review     Row Name 04/03/21 1625 04/11/21 1310 05/09/21 1543         Core Components/Risk Factors/Patient Goals Review   Personal Goals Review Weight Management/Obesity;Hypertension;Lipids Weight Management/Obesity;Hypertension;Lipids Weight Management/Obesity;Hypertension;Lipids     Review Lelend started exercise on 04/03/21 and did well with exerciser ZohSuheyb off to a good start to exercise. Vital signs have been stable. ZohClevers good attendance and participation in phase 2 cardiac rehab.     Expected Outcomes ZohCadynll continue to partcipate in phase 2 cardiac rehab for exercise, nutrition and lifestyle modifications Jacori will continue to partcipate in phase 2 cardiac rehab for exercise, nutrition and lifestyle modifications Quade will continue to partcipate in phase 2 cardiac rehab for exercise, nutrition and lifestyle modifications              Core Components/Risk Factors/Patient Goals at Discharge (Final Review):   Goals and Risk Factor Review - 05/09/21 1543       Core Components/Risk Factors/Patient Goals Review   Personal Goals Review Weight Management/Obesity;Hypertension;Lipids    Review ZohBarres good attendance and participation in phase 2 cardiac rehab.    Expected Outcomes ZohDavantall continue to partcipate in phase 2 cardiac rehab for exercise, nutrition and lifestyle modifications             ITP Comments:  ITP Comments     Row Name 03/30/21 1519 04/03/21 1619 04/11/21 1309 05/09/21 1541     ITP Comments Dr TraFransico Him, Medical Director 30 Day ITP Review. Yosiah  started exercise on 04/03/21 and did well with exercise. 30 Day ITP Review. ZohQusai off to a good start to exercise at phase 2 cardiac rehab. 30 Day ITP Review. ZohAndies good attendance and participation in phase 2 cardiac rehab.             Comments: See ITP comments.MarHarrell Gave BSN

## 2021-05-10 ENCOUNTER — Encounter (HOSPITAL_COMMUNITY): Payer: 59

## 2021-05-10 ENCOUNTER — Ambulatory Visit (HOSPITAL_COMMUNITY): Payer: 59

## 2021-05-12 ENCOUNTER — Encounter (HOSPITAL_COMMUNITY): Payer: 59

## 2021-05-12 ENCOUNTER — Other Ambulatory Visit: Payer: Self-pay | Admitting: Cardiology

## 2021-05-12 ENCOUNTER — Ambulatory Visit (HOSPITAL_COMMUNITY): Payer: 59

## 2021-05-12 ENCOUNTER — Other Ambulatory Visit: Payer: Self-pay | Admitting: Physician Assistant

## 2021-05-12 DIAGNOSIS — I25118 Atherosclerotic heart disease of native coronary artery with other forms of angina pectoris: Secondary | ICD-10-CM

## 2021-05-12 DIAGNOSIS — I10 Essential (primary) hypertension: Secondary | ICD-10-CM

## 2021-05-12 DIAGNOSIS — R072 Precordial pain: Secondary | ICD-10-CM

## 2021-05-15 ENCOUNTER — Ambulatory Visit (HOSPITAL_COMMUNITY): Payer: 59

## 2021-05-15 ENCOUNTER — Telehealth (HOSPITAL_COMMUNITY): Payer: Self-pay | Admitting: *Deleted

## 2021-05-15 ENCOUNTER — Encounter (HOSPITAL_COMMUNITY): Payer: 59

## 2021-05-15 NOTE — Telephone Encounter (Signed)
Left message to call cardiac rehab regarding attendance.Barnet Pall, RN,BSN 05/15/2021 1:44 PM

## 2021-05-16 ENCOUNTER — Other Ambulatory Visit: Payer: 59

## 2021-05-17 ENCOUNTER — Encounter (HOSPITAL_COMMUNITY): Payer: 59

## 2021-05-17 ENCOUNTER — Ambulatory Visit (HOSPITAL_COMMUNITY): Payer: 59

## 2021-05-19 ENCOUNTER — Encounter (HOSPITAL_COMMUNITY): Payer: 59

## 2021-05-19 ENCOUNTER — Ambulatory Visit (HOSPITAL_COMMUNITY): Payer: 59

## 2021-05-22 ENCOUNTER — Ambulatory Visit (HOSPITAL_COMMUNITY): Payer: 59

## 2021-05-22 ENCOUNTER — Encounter (HOSPITAL_COMMUNITY): Payer: 59

## 2021-05-23 ENCOUNTER — Ambulatory Visit: Payer: 59 | Admitting: Cardiology

## 2021-05-23 ENCOUNTER — Telehealth: Payer: Self-pay

## 2021-05-23 ENCOUNTER — Encounter: Payer: Self-pay | Admitting: Cardiology

## 2021-05-23 ENCOUNTER — Other Ambulatory Visit: Payer: Self-pay

## 2021-05-23 VITALS — BP 118/74 | HR 62 | Temp 97.6°F | Resp 16 | Ht 66.0 in | Wt 177.8 lb

## 2021-05-23 DIAGNOSIS — I25119 Atherosclerotic heart disease of native coronary artery with unspecified angina pectoris: Secondary | ICD-10-CM

## 2021-05-23 DIAGNOSIS — Z951 Presence of aortocoronary bypass graft: Secondary | ICD-10-CM

## 2021-05-23 DIAGNOSIS — R002 Palpitations: Secondary | ICD-10-CM

## 2021-05-23 DIAGNOSIS — I700 Atherosclerosis of aorta: Secondary | ICD-10-CM

## 2021-05-23 DIAGNOSIS — I10 Essential (primary) hypertension: Secondary | ICD-10-CM

## 2021-05-23 DIAGNOSIS — E782 Mixed hyperlipidemia: Secondary | ICD-10-CM

## 2021-05-23 DIAGNOSIS — Z87891 Personal history of nicotine dependence: Secondary | ICD-10-CM

## 2021-05-23 NOTE — Telephone Encounter (Signed)
Patient called and stated that he was feeling mild chest pain and palpitations. I suggested he be put on the schedule ASAP. Patient is agreeable, called transferred to front desk staff (100), to schedule appointment.

## 2021-05-23 NOTE — Progress Notes (Signed)
ID:  Eugene Mccullough, DOB May 31, 1961, MRN [id number]  PCP:  Malena Peer, MD  Cardiologist:  Rex Kras, DO, [facility] (established care 11/25/2020) Former Cardiology Providers: Dr. Lyman Bishop   Date: 05/23/21 Last Office Visit: 04/27/2021  Chief Complaint  Patient presents with   Palpitations   Follow-up    Throat pain    HPI  Eugene Mccullough is a 60 y.o. 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  He is referred to the office at the request of [name], [name], MD for evaluation of chest pain.  Initially presented with chest pain/heartburn which were suggestive of CAD.  He underwent an ischemic evaluation and was noted to have multivessel CAD on his coronary CTA.  He subsequently underwent left heart catheterization followed by four-vessel bypass with Dr. [name] on Jan 10, 2021.  After his four-vessel bypass his chest tube output was high and therefore he underwent mediastinal reexploration the following day and the source was cauterized but he required 4 units of PRBC postoperatively.  Patient presents today for a sick visit to discuss his symptoms of throat pain and palpitations.  Patient states that the palpitations have increased in frequency, usually more noticeable when he is resting or in a quiet environment, states it is present all the time, no improving or worsening factors, at times associated with lightheaded and dizziness, no syncope.  Patient consumes approximately 1 cup of caffeinated coffee on a daily basis and up to 2 cups of tea.  Denies any use of illicits, stimulants, weight loss supplements, herbal supplements, or energy drinks.  With regards to  the throat pain patient states that the symptoms are usually brought on by over exertional activities.  He used to describe this as his anginal equivalent in the past.  However he has undergone four-vessel bypass surgery recently in May 2022.  However, due to reoccurrence of symptoms he was recommended to have stress test done to reevaluate for reversible ischemia.  This is scheduled for October 2022.  In addition, he also has an extensive history of heartburn and he was advised to see gastroenterology at the last visit.  Patient states that this is still pending and he has an appointment in approximately 2 weeks.  He has not required the use of sublingual nitroglycerin tablets.  No hospitalizations or urgent care visits for cardiovascular symptoms since last office encounter.  His physical activity level and endurance continues to improve and has lost approximately additional 6 pounds since last office visit due to lifestyle changes.  FUNCTIONAL STATUS: Cardiac rehab and walks 20-45mnutes daily.   ALLERGIES: Allergies  Allergen Reactions   Hydrocodone-Acetaminophen Itching   Oxycodone Itching    MEDICATION LIST PRIOR TO VISIT: Current Meds  Medication Sig   amLODipine (NORVASC) 5 MG tablet Take 1 tablet (5 mg total) by mouth daily. (Patient taking differently: Take 5 mg by mouth 2 (two) times daily.)   aspirin EC 81 MG tablet Take 1 tablet (81 mg total) by mouth daily. Swallow whole.   atorvastatin (LIPITOR) 40 MG tablet Take 1 tablet (40 mg total) by mouth at bedtime.   clopidogrel (PLAVIX) 75 MG tablet Take 1 tablet (75 mg total) by mouth daily.   finasteride (PROSCAR) 5 MG tablet  TAKE 1 TABLET BY MOUTH EVERY DAY   gabapentin (NEURONTIN) 100 MG capsule Take 100 mg by mouth 2 (two) times daily.   metoprolol tartrate (LOPRESSOR) 25 MG tablet Take 1 tablet (25 mg total) by mouth 2 (two) times daily.   nitroGLYCERIN (NITROSTAT) 0.4 MG SL tablet PLACE 1 TABLET (0.4 MG TOTAL) UNDER THE TONGUE  EVERY 5 (FIVE) MINUTES AS NEEDED FOR CHEST PAIN. IF YOU REQUIRE MORE THAN TWO TABLETS FIVE MINUTES APART GO TO THE NEAREST ER VIA EMS.   pantoprazole (PROTONIX) 40 MG tablet Take 40 mg by mouth 2 (two) times daily.   tamsulosin (FLOMAX) 0.4 MG CAPS capsule Take 0.4 mg by mouth daily.     PAST MEDICAL HISTORY: Past Medical History:  Diagnosis Date   Anemia    Anginal pain (HCC)    Arthritis    BPH (benign prostatic hyperplasia)    Coronary artery disease    Eczema    Gastric ulcer    GERD (gastroesophageal reflux disease)    Gout    H. pylori infection    Hyperlipidemia    Hypertension    IBS (irritable bowel syndrome)    Pre-diabetes    Sleep apnea    Does not use CPAP    PAST SURGICAL HISTORY: Past Surgical History:  Procedure Laterality Date   CARDIAC CATHETERIZATION     2022   CHOLECYSTECTOMY  2005   COLONOSCOPY  08/2010   CORONARY ARTERY BYPASS GRAFT N/A 01/10/2021   Procedure: CORONARY ARTERY BYPASS GRAFTING (CABG) X  FOUR ON PUMP USING LEFT INTERNAL MAMMARY ARTERY, LEFT RADIAL ARTERY, RIGHT ENDOSCOPIC GREATER SAPHENOUS VEIN HARVEST CONDUITS;  Surgeon: Lajuana Matte, MD;  Location: [place];  Service: Open Heart Surgery;  Laterality: N/A;   EXPLORATION POST OPERATIVE OPEN HEART N/A 01/11/2021   Procedure: , MEDIASTINAL WASHOUT;  Surgeon: Lajuana Matte, MD;  Location: [place];  Service: Open Heart Surgery;  Laterality: N/A;   LEFT HEART CATH AND CORONARY ANGIOGRAPHY N/A 12/21/2020   Procedure: LEFT HEART CATH AND CORONARY ANGIOGRAPHY;  Surgeon: Nigel Mormon, MD;  Location: [place] [facility];  Service: Cardiovascular;  Laterality: N/A;   RADIAL ARTERY HARVEST Left 01/10/2021   Procedure: RADIAL ARTERY HARVEST;  Surgeon: Lajuana Matte, MD;  Location: [place];  Service: Open Heart Surgery;  Laterality: Left;   TEE WITHOUT CARDIOVERSION N/A 01/10/2021   Procedure: TRANSESOPHAGEAL ECHOCARDIOGRAM (TEE);  Surgeon: Lajuana Matte, MD;  Location: [place];   Service: Open Heart Surgery;  Laterality: N/A;   UPPER GI ENDOSCOPY  2019   WISDOM TOOTH EXTRACTION      FAMILY HISTORY: The patient family history includes Diabetes in his brother and sister; Eating disorder in his daughter; Eczema in his son; Hyperlipidemia in his mother; Hypertension in his sister; Irritable bowel syndrome in his son; Obesity in his sister.  SOCIAL HISTORY:  The patient  reports that he quit smoking about 8 years ago. His smoking use included cigarettes. He has a 20.00 pack-year smoking history. He has never used smokeless tobacco. He reports that he does not drink alcohol and does not use drugs.  REVIEW OF SYSTEMS: Review of Systems  Constitutional: Negative for chills and fever.  HENT:  Negative for hoarse voice and nosebleeds.   Eyes:  Negative for discharge, double vision and pain.  Cardiovascular:  Negative for chest pain, claudication, dyspnea on exertion, leg swelling, near-syncope, orthopnea, palpitations, paroxysmal nocturnal dyspnea and syncope.  Respiratory:  Negative for hemoptysis and shortness of  breath.   Musculoskeletal:  Negative for muscle cramps and myalgias.  Gastrointestinal:  Negative for abdominal pain, constipation, diarrhea, heartburn, hematemesis, hematochezia, melena, nausea and vomiting.  Neurological:  Negative for dizziness and light-headedness.   PHYSICAL EXAM: Vitals with BMI 05/23/2021 04/27/2021 04/27/2021  Height '5\' 6"'  - '5\' 6"'   Weight 177 lbs 13 oz - 184 lbs  BMI 35.70 - 17.79  Systolic 390 300 923  Diastolic 74 81 82  Pulse 62 66 67    CONSTITUTIONAL: Well-developed and well-nourished. No acute distress.  SKIN: Skin is warm and dry. No rash noted. No cyanosis. No pallor. No jaundice HEAD: Normocephalic and atraumatic.  EYES: No scleral icterus MOUTH/THROAT: Moist oral membranes.  NECK: No JVD present. No thyromegaly noted. No carotid bruits  LYMPHATIC: No visible cervical adenopathy.  CHEST Normal respiratory effort. No  intercostal retractions.  Sternotomy site is healing well, incision site is clean dry and intact. LUNGS: Clear to auscultation bilaterally.  No stridor. No wheezes. No rales.  CARDIOVASCULAR: Regular, positive S1-S2, no murmurs rubs or gallops appreciated ABDOMINAL: No apparent ascites.  EXTREMITIES: No peripheral edema.  Left radial site healing well, clean dry and intact.  Right lower extremity harvesting site has some induration but no signs of infection. HEMATOLOGIC: No significant bruising NEUROLOGIC: Oriented to person, place, and time. Nonfocal. Normal muscle tone.  PSYCHIATRIC: Normal mood and affect. Normal behavior. Cooperative  CARDIAC DATABASE: Coronary artery bypass grafting surgery: Jan 10, 2021 by Dr. [name] LIMA-LAD, Left radial artery - OM2 (proximal of diagonal vein graft), RSVG D1, and D2.   EKG: 05/23/2021: Sinus bradycardia, 53 bpm, low voltage in the limb leads, without underlying ischemia or injury pattern.   Echocardiogram: 12/13/2020: Normal LV systolic function with visual EF 50-55%. Left ventricle cavity is normal in size. Mild left ventricular hypertrophy. Normal global wall motion. Indeterminate diastolic filling pattern, normal LAP. Left atrial cavity is severely dilated. Mild (Grade I) mitral regurgitation. Mild tricuspid regurgitation. No evidence of pulmonary hypertension. Mild pulmonic regurgitation. IVC is dilated with a respiratory response of >50%. No prior study for comparison.    Stress Testing: Nuclear stress test 05/01/2017: The left ventricular ejection fraction is normal (55-65%). Nuclear stress EF: 59%. Blood pressure demonstrated a normal response to exercise. There was no ST segment deviation noted during stress. Defect 1: There is a small defect of moderate severity present in the basal inferior location. This is a low risk study.   Low risk stress nuclear study with probable inferobasal thinning and no significant ischemia; EF 59  with normal wall motion.  Coronary CTA 12/14/2020: 1. Coronary calcium score of 24.6 AU. This was 70th percentile for age and sex matched control. 2. Normal coronary origin with right dominance. 3. CAD-RADS = 4. Severe stenosis (70-99%) at the proximal /mid LAD due to mixed plaque. First diagonal branch patent but diffuse disease within the ostial to proximal segment. Second diagonal branch is overall patent. Diffuse disease within the proximal to mid segment LCx. RCA without evidence of plaque or stenosis. 4. Study is sent for CT-FFR to further evaluate the LAD, D1, and LCx. Findings will be performed and reported separately.  Noncardiac impressions: 1. No acute findings in the imaged extracardiac chest. 2. Moderate hiatal hernia. 3. Mild hepatic steatosis. 4. Aortic Atherosclerosis (ICD10-I70.0).  Coronary CT FFR 12/15/2020: CT FFR analysis showed significant stenosis at mid LAD (modeled as total occlusion), mid-to-distal LCX, and acute marginal modeled as total occlusion.  Heart Catheterization: 12/21/2020: LM: Normal LAD: Mid 100% occlusion  after Diag 2         Collaterals to distal LAD from Diag 2 and RPDA         High Diag 1 patent         Diag 2 diffuse 60-70% disease LCx: Proximal tandem 75% stenosis        Undefilled and sub-totally occluded OM1        Mid LCx 7% stenosis RCA: Mild luminal irregularities   Attempted wiring of LAD to assess chronicity of LAD occlusion. Based on wire behavior, appears to be chronic   Continue optimal medical management for now. Fortunately, he does not have any LM involvement and EF is normal. Any revascularization recommendation will be for symptomatic improvement.  In addition to optimal medical therapy, following are the options for revascularization   #1. CABG to distal LAD, ?diag2, OM1-if graftable #2. PCI to LCx +/- OM1, stage LAD CTO PCI   CABG more likely to achieve complete revascularization with less procedures. Will continue  outpatient discussion re: optimal revascularization   LABORATORY DATA: CBC Latest Ref Rng & Units 01/14/2021 01/12/2021 01/11/2021  WBC 4.0 - 10.5 K/uL 6.2 8.5 7.5  Hemoglobin 13.0 - 17.0 g/dL 10.2(L) 10.3(L) 10.2(L)  Hematocrit 39.0 - 52.0 % 30.3(L) 31.1(L) 29.7(L)  Platelets 150 - 400 K/uL 120(L) 90(L) 90(L)    CMP Latest Ref Rng & Units 02/21/2021 01/14/2021 01/13/2021  Glucose 65 - 99 mg/dL 80 83 100(H)  BUN 8 - 27 mg/dL '13 13 9  ' Creatinine 0.76 - 1.27 mg/dL 1.12 1.00 0.83  Sodium 134 - 144 mmol/L 137 131(L) 131(L)  Potassium 3.5 - 5.2 mmol/L 4.2 4.0 3.8  Chloride 96 - 106 mmol/L 102 102 102  CO2 20 - 29 mmol/L 23 21(L) 26  Calcium 8.6 - 10.2 mg/dL 10.1 8.5(L) 8.4(L)  Total Protein 6.0 - 8.5 g/dL 7.4 - -  Total Bilirubin 0.0 - 1.2 mg/dL 0.5 - -  Alkaline Phos 44 - 121 IU/L 108 - -  AST 0 - 40 IU/L 15 - -  ALT 0 - 44 IU/L 14 - -    Lipid Panel  Lab Results  Component Value Date   CHOL 97 (L) 02/21/2021   HDL 31 (L) 02/21/2021   LDLCALC 47 02/21/2021   LDLDIRECT 52 02/21/2021   TRIG 98 02/21/2021   CHOLHDL 4 07/02/2018    No components found for: NTPROBNP Recent Labs    12/19/20 1122  PROBNP 99   No results for input(s): TSH in the last 8760 hours.  BMP Recent Labs    01/12/21 0444 01/13/21 1014 01/14/21 0326 02/21/21 0900  NA 135 131* 131* 137  K 3.8 3.8 4.0 4.2  CL 107 102 102 102  CO2 22 26 21* 23  GLUCOSE 107* 100* 83 80  BUN '6 9 13 13  ' CREATININE 0.86 0.83 1.00 1.12  CALCIUM 8.4* 8.4* 8.5* 10.1  GFRNONAA >60 >60 >60  --     HEMOGLOBIN A1C Lab Results  Component Value Date   HGBA1C 5.3 01/06/2021   MPG 105.41 01/06/2021   External Labs: Collected: 11/09/2020, received from PCP Hemoglobin 15.8 g/dL, hematocrit 48% Platelets 143 Creatinine 0.93 mg/dL. eGFR: 103 mL/min per 1.73 m Potassium 4.9 AST 23, ALT 40, alkaline phosphatase 108 Lipid profile: Total cholesterol 191, triglycerides 91, HDL 47, LDL 125, non-HDL 144 Hemoglobin A1c:  5.3 TSH: 1.07  IMPRESSION:    ICD-10-CM   1. Palpitations  R00.2 EKG 12-Lead    LONG TERM MONITOR (3-14 DAYS)  2. Coronary artery disease involving native coronary artery of native heart with angina pectoris ([place])  I25.119     3. Hx of CABG (CABG X 4.  LIMA-LAD, Left radial artery - OM2 (proximal of diagonal vein graft), RSVG D1, and D2).  Z95.1     4. Atherosclerosis of aorta (HCC)  I70.0     5. Mixed hyperlipidemia  E78.2     6. Essential hypertension  I10     7. Former smoker  Z87.891        RECOMMENDATIONS: Eugene Mccullough is a 60 y.o. 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  Palpitations: New/acute problem No identifiable reversible cause. EKG shows sinus mechanism without underlying dysrhythmias. Patient is asked to follow-up with PCP and have his thyroid levels checked. 14-day extended Holter monitor to evaluate for dysrhythmias. I suspect it is most likely secondary to anxiety and/or rare PACs/PVCs. Monitor for now.  Coronary artery disease involving native coronary artery of native heart with angina pectoris [facility]) Status post four-vessel bypass 01/10/2021 and mediastinal exploration 01/11/2021. Denies active chest pain during today's encounter EKG nonischemic. But has noticed exertional throat pain with exertional related activities but less intense compared to before. No use of sublingual nitroglycerin tablets. Currently on dual antiplatelet therapy Continue beta-blockers. Continue cardiac rehab. Plans to see gastroenterology in approximately 2 weeks given his symptoms and history of prolonged heartburn/GERD. Monitor for now.  Hx of CABG (CABG X 4.  LIMA-LAD, Left radial artery - OM2 (proximal of diagonal vein graft), RSVG D1,  and D2). See above. Secondary prevention.  Atherosclerosis of aorta (HCC) Continue statin therapy, LDL currently at goal.  Essential hypertension Office blood pressures are within excellent control Continue current medical therapy. Low-salt diet recommended.  Mixed hyperlipidemia Continue atorvastatin. Most recent LDL <70 mg/dL.  Former smoker Educated on the importance of continued smoking cessation.  FINAL MEDICATION LIST END OF ENCOUNTER: No orders of the defined types were placed in this encounter.    Current Outpatient Medications:    amLODipine (NORVASC) 5 MG tablet, Take 1 tablet (5 mg total) by mouth daily. (Patient taking differently: Take 5 mg by mouth 2 (two) times daily.), Disp: 90 tablet, Rfl: 1   aspirin EC 81 MG tablet, Take 1 tablet (81 mg total) by mouth daily. Swallow whole., Disp: 90 tablet, Rfl: 3   atorvastatin (LIPITOR) 40 MG tablet, Take 1 tablet (40 mg total) by mouth at bedtime., Disp: 90 tablet, Rfl: 1   clopidogrel (PLAVIX) 75 MG tablet, Take 1 tablet (75 mg total) by mouth daily., Disp: 90 tablet, Rfl: 3   finasteride (PROSCAR) 5 MG tablet, TAKE 1 TABLET BY MOUTH EVERY DAY, Disp: 90 tablet, Rfl: 1   gabapentin (NEURONTIN) 100 MG capsule, Take 100 mg by mouth 2 (two) times daily., Disp: , Rfl:    metoprolol tartrate (LOPRESSOR) 25 MG tablet, Take 1 tablet (25 mg total) by mouth 2 (two) times daily., Disp: 60 tablet, Rfl: 2   nitroGLYCERIN (NITROSTAT) 0.4 MG SL tablet, PLACE 1 TABLET (0.4 MG TOTAL) UNDER THE TONGUE EVERY 5 (FIVE) MINUTES AS NEEDED FOR CHEST PAIN. IF YOU REQUIRE MORE THAN TWO TABLETS FIVE MINUTES APART GO TO THE NEAREST ER VIA EMS., Disp: 25 tablet, Rfl: 0   pantoprazole (PROTONIX) 40 MG tablet, Take 40 mg by mouth 2 (two)  times daily., Disp: , Rfl:    tamsulosin (FLOMAX) 0.4 MG CAPS capsule, Take 0.4 mg by mouth daily., Disp: , Rfl:   Orders Placed This Encounter  Procedures   LONG TERM MONITOR (3-14 DAYS)   EKG 12-Lead     There  are no Patient Instructions on file for this visit.   --Continue cardiac medications as reconciled in final medication list. --Return in about 6 weeks (around 07/04/2021) for Follow up, Palpitations, Chest pain, Review test results. Or sooner if needed. --Continue follow-up with your primary care physician regarding the management of your other chronic comorbid conditions.  Patient's questions and concerns were addressed to his satisfaction. He voices understanding of the instructions provided during this encounter.   This note was created using a voice recognition software as a result there may be grammatical errors inadvertently enclosed that do not reflect the nature of this encounter. Every attempt is made to correct such errors.  Rex Kras, [place], [facility]  Pager: [phone] Office: [phone]

## 2021-05-23 NOTE — Telephone Encounter (Signed)
Will see him in office today.

## 2021-05-24 ENCOUNTER — Encounter (HOSPITAL_COMMUNITY): Payer: 59

## 2021-05-24 ENCOUNTER — Ambulatory Visit: Payer: 59 | Admitting: Cardiology

## 2021-05-24 ENCOUNTER — Ambulatory Visit (HOSPITAL_COMMUNITY): Payer: 59

## 2021-05-26 ENCOUNTER — Ambulatory Visit (HOSPITAL_COMMUNITY): Payer: 59

## 2021-05-26 ENCOUNTER — Encounter (HOSPITAL_COMMUNITY): Payer: Self-pay | Admitting: *Deleted

## 2021-05-26 ENCOUNTER — Encounter (HOSPITAL_COMMUNITY): Payer: 59

## 2021-05-26 DIAGNOSIS — Z951 Presence of aortocoronary bypass graft: Secondary | ICD-10-CM

## 2021-05-26 NOTE — Progress Notes (Addendum)
Discharge Progress Report  Patient Details  Name: Eugene Mccullough MRN: [id number] Date of Birth: [birth year] Referring Provider:   Flowsheet Row CARDIAC REHAB PHASE II ORIENTATION from 03/30/2021 in [place]  Referring Provider Rex Kras, MD        Number of Visits: 12  Reason for Discharge:  Patient reached a stable level of exercise. 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  ADL [facility]:   Initial Exercise Prescription:  Initial Exercise Prescription - 03/30/21 1500       Date of Initial Exercise RX and Referring Provider   Date 03/30/21    Referring Provider Rex Kras, MD    Expected Discharge Date 05/26/21      Bike   Level 2    Minutes 15    METs 30      NuStep   Level 2    SPM 85    Minutes 15    METs 2.4      Prescription Details   Frequency (times per week) 3    Duration Progress to 30 minutes of continuous aerobic without signs/symptoms of physical distress      Intensity   THRR 40-80% of Max Heartrate 64-128    Ratings of Perceived Exertion 11-13    Perceived Dyspnea 0-4      Progression   Progression Continue progressive overload as per policy without signs/symptoms or physical distress.      Resistance Training   Training Prescription Yes    Weight 5 lbs    Reps 10-15             Discharge Exercise Prescription (Final Exercise Prescription Changes):  Exercise Prescription Changes - 05/05/21 1500       Response to Exercise   Blood Pressure (Admit) 118/66    Blood Pressure (Exercise) 118/56    Blood Pressure (Exit) 98/70    Heart Rate (Admit) 81 bpm    Heart Rate (Exercise) 91 bpm    Heart Rate (Exit) 69 bpm    Rating of Perceived Exertion  (Exercise) 12    Symptoms None    Comments Reviewed METs and Goals.    Duration Continue with 30 min of aerobic exercise without signs/symptoms of physical distress.    Intensity THRR unchanged      Progression   Progression Continue to progress workloads to maintain intensity without signs/symptoms of physical distress.    Average METs 3.6      Resistance Training   Training Prescription Yes    Weight 5 lbs    Reps 10-15    Time 10 Minutes      Interval Training   Interval Training No      Bike   Level 3    Minutes 15    METs 4.3      NuStep   Level 4    SPM 85    Minutes 15    METs 2.9      Home Exercise Plan   Plans to continue exercise at Home (comment)    Frequency Add 3 additional days to program exercise sessions.    Initial Home Exercises Provided 04/21/21  Functional Capacity:  6 Minute Walk     Row Name 03/30/21 1313         6 Minute Walk   Phase Initial     Distance 1436 feet     Walk Time 6 minutes     # of Rest Breaks 0     MPH 2.72     METS 3.4     RPE 11     Perceived Dyspnea  0     VO2 Peak 11.9     Symptoms No     Resting HR 65 bpm     Resting BP 110/70     Resting Oxygen Saturation  99 %     Exercise Oxygen Saturation  during 6 min walk 98 %     Max Ex. HR 84 bpm     Max Ex. BP 120/72     2 Minute Post BP 112/64              Psychological, QOL, Others - Outcomes: PHQ 2/9: Depression screen [facility] 2/9 03/30/2021 03/30/2021 12/05/2017 06/13/2017  Decreased Interest 0 0 0 0  Down, Depressed, Hopeless 0 0 0 0  PHQ - 2 Score 0 0 0 0  Altered sleeping - - 0 -  Tired, decreased energy - - 1 -  Change in appetite - - 0 -  Feeling bad or failure about yourself  - - 0 -  Trouble concentrating - - 0 -  Moving slowly or fidgety/restless - - 0 -  Suicidal thoughts - - 0 -  PHQ-9 Score - - 1 -    Quality of Life:  Quality of Life - 03/30/21 1508       Quality of Life   Select Quality of Life      Quality of Life  Scores   Health/Function Pre 26.93 %    Socioeconomic Pre 26.81 %    Psych/Spiritual Pre 28.79 %    Family Pre 26.4 %    GLOBAL Pre 27.2 %             Personal Goals: Goals established at orientation with interventions provided to work toward goal.  Personal Goals and Risk Factors at Admission - 03/30/21 1509       Core Components/Risk Factors/Patient Goals on Admission    Weight Management Yes;Weight Loss    Intervention Weight Management: Develop a combined nutrition and exercise program designed to reach desired caloric intake, while maintaining appropriate intake of nutrient and fiber, sodium and fats, and appropriate energy expenditure required for the weight goal.;Weight Management: Provide education and appropriate resources to help participant work on and attain dietary goals.;Weight Management/Obesity: Establish reasonable short term and long term weight goals.    Admit Weight 178 lb 5.6 oz (80.9 kg)    Expected Outcomes Short Term: Continue to assess and modify interventions until short term weight is achieved;Long Term: Adherence to nutrition and physical activity/exercise program aimed toward attainment of established weight goal;Weight Maintenance: Understanding of the daily nutrition guidelines, which includes 25-35% calories from fat, 7% or less cal from saturated fats, less than 250m cholesterol, less than 1.5gm of sodium, & 5 or more servings of fruits and vegetables daily;Weight Loss: Understanding of general recommendations for a balanced deficit meal plan, which promotes 1-2 lb weight loss per week and includes a negative energy balance of [phone] kcal/d;Understanding recommendations for meals to include 15-35% energy as protein, 25-35% energy from fat, 35-60% energy from carbohydrates, less than 2015mof dietary cholesterol,  20-35 gm of total fiber daily;Understanding of distribution of calorie intake throughout the day with the consumption of 4-5 meals/snacks     Hypertension Yes    Intervention Provide education on lifestyle modifcations including regular physical activity/exercise, weight management, moderate sodium restriction and increased consumption of fresh fruit, vegetables, and low fat dairy, alcohol moderation, and smoking cessation.;Monitor prescription use compliance.    Expected Outcomes Short Term: Continued assessment and intervention until BP is < 140/36m HG in hypertensive participants. < 130/818mHG in hypertensive participants with diabetes, heart failure or chronic kidney disease.;Long Term: Maintenance of blood pressure at goal levels.    Lipids Yes    Intervention Provide education and support for participant on nutrition & aerobic/resistive exercise along with prescribed medications to achieve LDL <707mHDL >28m64m  Expected Outcomes Short Term: Participant states understanding of desired cholesterol values and is compliant with medications prescribed. Participant is following exercise prescription and nutrition guidelines.;Long Term: Cholesterol controlled with medications as prescribed, with individualized exercise RX and with personalized nutrition plan. Value goals: LDL < 70mg76mL > 40 mg.              Personal Goals Discharge:  Goals and Risk Factor Review     Row Name 04/03/21 1625 04/11/21 1310 05/09/21 1543 05/26/21 1524       Core Components/Risk Factors/Patient Goals Review   Personal Goals Review Weight Management/Obesity;Hypertension;Lipids Weight Management/Obesity;Hypertension;Lipids Weight Management/Obesity;Hypertension;Lipids Weight Management/Obesity;Hypertension;Lipids    Review Novah started exercise on 04/03/21 and did well with exerciser ZohaiNavarreff to a good start to exercise. Vital signs have been stable. ZohaiBrowniegood attendance and participation in phase 2 cardiac rehab. Omir did not return to exercise at cardiac rehab after 05/05/21. Mr MohhaDarlin Cocowell with exercise when in attendance     Expected Outcomes ZohaiSopheap continue to partcipate in phase 2 cardiac rehab for exercise, nutrition and lifestyle modifications ZohaiDarnelle continue to partcipate in phase 2 cardiac rehab for exercise, nutrition and lifestyle modifications ZohaiJyquan continue to partcipate in phase 2 cardiac rehab for exercise, nutrition and lifestyle modifications Sajid hopefully will continue to  exercise,follow  nutrition and lifestyle modifications as he has completed phase 2 cardaic rehab.             Exercise Goals and Review:  Exercise Goals     Row Name 03/30/21 1520             Exercise Goals   Increase Physical Activity Yes       Intervention Provide advice, education, support and counseling about physical activity/exercise needs.;Develop an individualized exercise prescription for aerobic and resistive training based on initial evaluation findings, risk stratification, comorbidities and participant's personal goals.       Expected Outcomes Long Term: Add in home exercise to make exercise part of routine and to increase amount of physical activity.;Long Term: Exercising regularly at least 3-5 days a week.;Short Term: Attend rehab on a regular basis to increase amount of physical activity.       Increase Strength and Stamina Yes       Intervention Provide advice, education, support and counseling about physical activity/exercise needs.;Develop an individualized exercise prescription for aerobic and resistive training based on initial evaluation findings, risk stratification, comorbidities and participant's personal goals.       Expected Outcomes Short Term: Increase workloads from initial exercise prescription for resistance, speed, and METs.;Short Term: Perform resistance training exercises routinely during rehab and add in resistance training at home;Long  Term: Improve cardiorespiratory fitness, muscular endurance and strength as measured by increased METs and functional capacity (6MWT)        Able to understand and use rate of perceived exertion (RPE) scale Yes       Intervention Provide education and explanation on how to use RPE scale       Expected Outcomes Short Term: Able to use RPE daily in rehab to express subjective intensity level;Long Term:  Able to use RPE to guide intensity level when exercising independently       Knowledge and understanding of Target Heart Rate Range (THRR) Yes       Intervention Provide education and explanation of THRR including how the numbers were predicted and where they are located for reference       Expected Outcomes Short Term: Able to state/look up THRR;Short Term: Able to use daily as guideline for intensity in rehab;Long Term: Able to use THRR to govern intensity when exercising independently       Understanding of Exercise Prescription Yes       Intervention Provide education, explanation, and written materials on patient's individual exercise prescription       Expected Outcomes Short Term: Able to explain program exercise prescription;Long Term: Able to explain home exercise prescription to exercise independently                Exercise Goals Re-Evaluation:  Exercise Goals Re-Evaluation     Row Name 04/03/21 1425 04/21/21 1500 05/05/21 1500         Exercise Goal Re-Evaluation   Exercise Goals Review Increase Physical Activity;Able to understand and use rate of perceived exertion (RPE) scale Increase Physical Activity;Increase Strength and Stamina;Able to understand and use rate of perceived exertion (RPE) scale;Knowledge and understanding of Target Heart Rate Range (THRR);Able to check pulse independently;Understanding of Exercise Prescription Increase Physical Activity;Increase Strength and Stamina;Able to understand and use rate of perceived exertion (RPE) scale;Knowledge and understanding of Target Heart Rate Range (THRR);Able to check pulse independently     Comments Patient able to understand and use RPE scale appropriately.  Reviewed METs and home exercise Rx. Pt walks at home most days 20 minutes. Will increase to 30 minutes. Pt is making good progress toward goals. Pt returns to work on Monday. Pt has a goal of weight loss but has not lost any signifiicant amount of weight.     Expected Outcomes Progress workloads as tolerated to help achieve personal health and fitness goals. Pt will continue to walk at home on his own. Will continue to montior and progress exercise workloads as tolerated.              Nutrition & Weight - Outcomes:  Pre Biometrics - 03/30/21 1505       Pre Biometrics   Waist Circumference 38.5 inches    Hip Circumference 42.5 inches    Waist to Hip Ratio 0.91 %    Triceps Skinfold 12 mm    % Body Fat 26.1 %    Grip Strength 33 kg    Flexibility 16.75 in    Single Leg Stand 30 seconds              Nutrition:  Nutrition Therapy & Goals - 04/06/21 1341       Nutrition Therapy   Diet TLC    Drug/Food Interactions Statins/Certain Fruits      Personal Nutrition Goals   Nutrition Goal Pt to identify food quantities necessary to achieve weight loss of 6-24  lb at graduation from cardiac rehab.    Personal Goal #2 Pt to build a healthy plate including vegetables, fruits, whole grains, and low-fat dairy products in a heart healthy meal plan      Intervention Plan   Intervention Prescribe, educate and counsel regarding individualized specific dietary modifications aiming towards targeted core components such as weight, hypertension, lipid management, diabetes, heart failure and other comorbidities.;Nutrition handout(s) given to patient.    Expected Outcomes Long Term Goal: Adherence to prescribed nutrition plan.;Short Term Goal: Understand basic principles of dietary content, such as calories, fat, sodium, cholesterol and nutrients.             Nutrition Discharge:   Education Questionnaire Score:  Knowledge Questionnaire Score - 03/30/21 1509       Knowledge  Questionnaire Score   Pre Score 21/24           Mr Gains completed  12 exercise sessions  between 04/03/21-09/09/2in Phase II. Pt maintained good attendance  until 05/05/21.and progressed nicely during his participation in rehab as evidenced by increased MET level.  Pt has made significant lifestyle changes and should be commended for his success. Mr Hallinan did not return to the program. He told a staff member he planned to return to work during exercise.Did not get to reassess PHQ 2-9 . Harrell Gave RN BSN

## 2021-05-29 ENCOUNTER — Ambulatory Visit (HOSPITAL_COMMUNITY): Payer: 59

## 2021-05-31 ENCOUNTER — Ambulatory Visit (HOSPITAL_COMMUNITY): Payer: 59

## 2021-05-31 ENCOUNTER — Ambulatory Visit: Payer: 59

## 2021-05-31 ENCOUNTER — Other Ambulatory Visit: Payer: Self-pay

## 2021-05-31 DIAGNOSIS — Z951 Presence of aortocoronary bypass graft: Secondary | ICD-10-CM

## 2021-05-31 DIAGNOSIS — I25119 Atherosclerotic heart disease of native coronary artery with unspecified angina pectoris: Secondary | ICD-10-CM

## 2021-05-31 LAB — PCV MYOCARDIAL PERFUSION WO LEXISCAN
Angina Index: 1
ST Depression (mm): 0 mm

## 2021-06-02 ENCOUNTER — Ambulatory Visit (HOSPITAL_COMMUNITY): Payer: 59

## 2021-06-05 ENCOUNTER — Ambulatory Visit (HOSPITAL_COMMUNITY): Payer: 59

## 2021-06-07 ENCOUNTER — Ambulatory Visit (HOSPITAL_COMMUNITY): Payer: 59

## 2021-06-09 ENCOUNTER — Ambulatory Visit (HOSPITAL_COMMUNITY): Payer: 59

## 2021-06-26 ENCOUNTER — Encounter: Payer: Self-pay | Admitting: Cardiology

## 2021-06-26 ENCOUNTER — Ambulatory Visit: Payer: 59 | Admitting: Cardiology

## 2021-06-26 ENCOUNTER — Other Ambulatory Visit: Payer: Self-pay

## 2021-06-26 VITALS — BP 103/72 | HR 71 | Resp 16 | Ht 66.0 in | Wt 177.0 lb

## 2021-06-26 DIAGNOSIS — R002 Palpitations: Secondary | ICD-10-CM

## 2021-06-26 DIAGNOSIS — Z87891 Personal history of nicotine dependence: Secondary | ICD-10-CM

## 2021-06-26 DIAGNOSIS — I25119 Atherosclerotic heart disease of native coronary artery with unspecified angina pectoris: Secondary | ICD-10-CM

## 2021-06-26 DIAGNOSIS — Z951 Presence of aortocoronary bypass graft: Secondary | ICD-10-CM

## 2021-06-26 DIAGNOSIS — E782 Mixed hyperlipidemia: Secondary | ICD-10-CM

## 2021-06-26 DIAGNOSIS — I10 Essential (primary) hypertension: Secondary | ICD-10-CM

## 2021-06-26 DIAGNOSIS — I700 Atherosclerosis of aorta: Secondary | ICD-10-CM

## 2021-06-26 MED ORDER — METOPROLOL SUCCINATE ER 25 MG PO TB24: 25.0000 mg | ORAL_TABLET | Freq: Every morning | ORAL | 0 refills | Status: DC

## 2021-06-26 NOTE — Progress Notes (Signed)
ID:  Eugene Mccullough, DOB Oct 03, 1960, MRN [id number]  PCP:  Malena Peer, MD  Cardiologist:  Rex Kras, DO, [facility] (established care 11/25/2020) Former Cardiology Providers: Dr. Lyman Bishop   Date: 06/26/21 Last Office Visit: 05/23/2021  Chief Complaint  Patient presents with   Palpitations   Chest Pain   Results   Follow-up    HPI  Eugene Mccullough is a 60 y.o. African-American male who presents to the office with a chief complaint of " reevaluation of palpitations and chest pain." Patient's past medical history and cardiovascular risk factors include: Multivessel CAD status post four-vessel bypass surgery, HTN, HLD, BPH, mild coronary artery calcification, 26-pack-year history of smoking (quit in 2014), obesity due to excess calories.  He is referred to the office at the request of [name], [name], MD for evaluation of chest pain.  This patient is accompanied in the office by his spouse.  Initially presented with chest pain/heartburn which were suggestive of CAD.  He underwent an ischemic evaluation and was noted to have multivessel CAD on his coronary CTA.  He subsequently underwent left heart catheterization followed by four-vessel bypass with Dr. [name] on Jan 10, 2021.  After his four-vessel bypass his chest tube output was high and therefore he underwent mediastinal reexploration the following day and the source was cauterized but he required 4 units of PRBC postoperatively.  Due to reoccurrence office visits for chest pain/throat discomfort the shared decision at last visit was to proceed with stress test to evaluate for reversible ischemia.  Patient was reemphasized on the importance of following up with gastroenterology for evaluation.  Since last office visit patient did undergo stress test which noted overall a low risk study without any reversible perfusion defect on SPECT image.  And he did follow-up with GI and was noted to have H. pylori infection and currently being  treated.  Since the medications for H. pylori infection his chest pain/throat discomfort has improved immensely.  No use of sublingual nitroglycerin tablets.  He is currently not having any chest pain.  With regards to palpitations patient continues to have such discomfort since last visit.  He is currently on Lopressor 25 mg p.o. twice daily.  He was supposed to have a 14-day extended Holter monitor prior to today's visit but this has not yet been completed.  Please refer to the last office note with regards to the characteristic of his palpitations.  FUNCTIONAL STATUS: Walks 20-61mnutes daily.   ALLERGIES: Allergies  Allergen Reactions   Hydrocodone-Acetaminophen Itching   Oxycodone Itching    MEDICATION LIST PRIOR TO VISIT: Current Meds  Medication Sig   amLODipine (NORVASC) 5 MG tablet Take 1 tablet (5 mg total) by mouth daily.   amoxicillin (AMOXIL) 500 MG capsule Take by mouth 2 (two) times daily.   aspirin EC 81 MG tablet Take 1 tablet (81 mg total) by mouth daily. Swallow whole.   atorvastatin (LIPITOR) 40 MG tablet Take 1 tablet (40 mg total) by mouth at bedtime.   clarithromycin (BIAXIN) 500 MG tablet SMARTSIG:1 Tablet(s) By Mouth Every 12 Hours   clopidogrel (PLAVIX) 75 MG tablet Take 1 tablet (75 mg total) by mouth daily.   finasteride (PROSCAR) 5 MG tablet TAKE 1 TABLET BY MOUTH EVERY DAY   gabapentin (NEURONTIN) 100 MG capsule Take 100 mg by mouth 2 (two) times daily.   metoprolol succinate (TOPROL XL) 25 MG 24 hr tablet Take 1 tablet (25 mg total) by mouth every morning.   nitroGLYCERIN (NITROSTAT) 0.4  MG SL tablet PLACE 1 TABLET (0.4 MG TOTAL) UNDER THE TONGUE EVERY 5 (FIVE) MINUTES AS NEEDED FOR CHEST PAIN. IF YOU REQUIRE MORE THAN TWO TABLETS FIVE MINUTES APART GO TO THE NEAREST ER VIA EMS.   pantoprazole (PROTONIX) 40 MG tablet Take 40 mg by mouth 2 (two) times daily.   tamsulosin (FLOMAX) 0.4 MG CAPS capsule Take 0.4 mg by mouth daily.   [DISCONTINUED] metoprolol  tartrate (LOPRESSOR) 25 MG tablet Take 1 tablet (25 mg total) by mouth 2 (two) times daily.     PAST MEDICAL HISTORY: Past Medical History:  Diagnosis Date   Anemia    Anginal pain (HCC)    Arthritis    BPH (benign prostatic hyperplasia)    Coronary artery disease    Eczema    Gastric ulcer    GERD (gastroesophageal reflux disease)    Gout    H. pylori infection    Hyperlipidemia    Hypertension    IBS (irritable bowel syndrome)    Pre-diabetes    Sleep apnea    Does not use CPAP    PAST SURGICAL HISTORY: Past Surgical History:  Procedure Laterality Date   CARDIAC CATHETERIZATION     2022   CHOLECYSTECTOMY  2005   COLONOSCOPY  08/2010   CORONARY ARTERY BYPASS GRAFT N/A 01/10/2021   Procedure: CORONARY ARTERY BYPASS GRAFTING (CABG) X  FOUR ON PUMP USING LEFT INTERNAL MAMMARY ARTERY, LEFT RADIAL ARTERY, RIGHT ENDOSCOPIC GREATER SAPHENOUS VEIN HARVEST CONDUITS;  Surgeon: Lajuana Matte, MD;  Location: [place];  Service: Open Heart Surgery;  Laterality: N/A;   EXPLORATION POST OPERATIVE OPEN HEART N/A 01/11/2021   Procedure: , MEDIASTINAL WASHOUT;  Surgeon: Lajuana Matte, MD;  Location: [place];  Service: Open Heart Surgery;  Laterality: N/A;   LEFT HEART CATH AND CORONARY ANGIOGRAPHY N/A 12/21/2020   Procedure: LEFT HEART CATH AND CORONARY ANGIOGRAPHY;  Surgeon: Nigel Mormon, MD;  Location: [place] [facility];  Service: Cardiovascular;  Laterality: N/A;   RADIAL ARTERY HARVEST Left 01/10/2021   Procedure: RADIAL ARTERY HARVEST;  Surgeon: Lajuana Matte, MD;  Location: [place];  Service: Open Heart Surgery;  Laterality: Left;   TEE WITHOUT CARDIOVERSION N/A 01/10/2021   Procedure: TRANSESOPHAGEAL ECHOCARDIOGRAM (TEE);  Surgeon: Lajuana Matte, MD;  Location: [place];  Service: Open Heart Surgery;  Laterality: N/A;   UPPER GI ENDOSCOPY  2019   WISDOM TOOTH EXTRACTION      FAMILY HISTORY: The patient family history includes Diabetes in his brother and  sister; Eating disorder in his daughter; Eczema in his son; Hyperlipidemia in his mother; Hypertension in his sister; Irritable bowel syndrome in his son; Obesity in his sister.  SOCIAL HISTORY:  The patient  reports that he quit smoking about 8 years ago. His smoking use included cigarettes. He has a 20.00 pack-year smoking history. He has never used smokeless tobacco. He reports that he does not drink alcohol and does not use drugs.  REVIEW OF SYSTEMS: Review of Systems  Constitutional: Negative for chills and fever.  HENT:  Negative for hoarse voice and nosebleeds.   Eyes:  Negative for discharge, double vision and pain.  Cardiovascular:  Negative for chest pain, claudication, dyspnea on exertion, leg swelling, near-syncope, orthopnea, palpitations, paroxysmal nocturnal dyspnea and syncope.  Respiratory:  Negative for hemoptysis and shortness of breath.   Musculoskeletal:  Negative for muscle cramps and myalgias.  Gastrointestinal:  Negative for abdominal pain, constipation, diarrhea, heartburn, hematemesis, hematochezia, melena, nausea and vomiting.  Neurological:  Negative for dizziness and light-headedness.   PHYSICAL EXAM: Vitals with BMI 06/26/2021 05/23/2021 04/27/2021  Height _0  _1  -  Weight 177 lbs 177 lbs 13 oz -  BMI 92.42 68.34 -  Systolic 196 222 979  Diastolic 72 74 81  Pulse 71 62 66    CONSTITUTIONAL: Well-developed and well-nourished. No acute distress.  SKIN: Skin is warm and dry. No rash noted. No cyanosis. No pallor. No jaundice HEAD: Normocephalic and atraumatic.  EYES: No scleral icterus MOUTH/THROAT: Moist oral membranes.  NECK: No JVD present. No thyromegaly noted. No carotid bruits  LYMPHATIC: No visible cervical adenopathy.  CHEST Normal respiratory effort. No intercostal retractions.  Sternotomy site is healing well, incision site is clean dry and intact. LUNGS: Clear to auscultation bilaterally.  No stridor. No wheezes. No rales.  CARDIOVASCULAR:  Regular, positive S1-S2, no murmurs rubs or gallops appreciated ABDOMINAL: No apparent ascites.  EXTREMITIES: No peripheral edema.  Left radial site healing well, clean dry and intact.  Right lower extremity harvesting site has some induration but no signs of infection. HEMATOLOGIC: No significant bruising NEUROLOGIC: Oriented to person, place, and time. Nonfocal. Normal muscle tone.  PSYCHIATRIC: Normal mood and affect. Normal behavior. Cooperative  CARDIAC DATABASE: Coronary artery bypass grafting surgery: Jan 10, 2021 by Dr. [name] LIMA-LAD, Left radial artery - OM2 (proximal of diagonal vein graft), RSVG D1, and D2.   EKG: 05/23/2021: Sinus bradycardia, 53 bpm, low voltage in the limb leads, without underlying ischemia or injury pattern.   Echocardiogram: 12/13/2020: Normal LV systolic function with visual EF 50-55%. Left ventricle cavity is normal in size. Mild left ventricular hypertrophy. Normal global wall motion. Indeterminate diastolic filling pattern, normal LAP. Left atrial cavity is severely dilated. Mild (Grade I) mitral regurgitation. Mild tricuspid regurgitation. No evidence of pulmonary hypertension. Mild pulmonic regurgitation. IVC is dilated with a respiratory response of >50%. No prior study for comparison.    Stress Testing: Exercise Tetrofosmin stress test 05/31/2021: Exercise nuclear stress test was performed using Bruce protocol. Patient reached 7 METS, and 86% of age predicted maximum heart rate. Exercise capacity was low. Non-limiting chest pain reported. Heart rate and hemodynamic response were normal. Stress EKG revealed no ischemic changes. Normal myocardial perfusion. Stress LVEF 54%. Low risk study. Non-limiting chest pain did not correlate with EKG or SPECT evidence of ischemia.   Coronary CTA 12/14/2020: 1. Coronary calcium score of 24.6 AU. This was 70th percentile for age and sex matched control. 2. Normal coronary origin with right dominance. 3.  CAD-RADS = 4. Severe stenosis (70-99%) at the proximal /mid LAD due to mixed plaque. First diagonal branch patent but diffuse disease within the ostial to proximal segment. Second diagonal branch is overall patent. Diffuse disease within the proximal to mid segment LCx. RCA without evidence of plaque or stenosis. 4. Study is sent for CT-FFR to further evaluate the LAD, D1, and LCx. Findings will be performed and reported separately.  Noncardiac impressions: 1. No acute findings in the imaged extracardiac chest. 2. Moderate hiatal hernia. 3. Mild hepatic steatosis. 4. Aortic Atherosclerosis (ICD10-I70.0).  Coronary CT FFR 12/15/2020: CT FFR analysis showed significant stenosis at mid LAD (modeled as total occlusion), mid-to-distal LCX, and acute marginal modeled as total occlusion.  Heart Catheterization: 12/21/2020: LM: Normal LAD: Mid 100% occlusion after Diag 2         Collaterals to distal LAD from Diag 2 and RPDA         High Diag 1 patent  Diag 2 diffuse 60-70% disease LCx: Proximal tandem 75% stenosis        Undefilled and sub-totally occluded OM1        Mid LCx 7% stenosis RCA: Mild luminal irregularities   Attempted wiring of LAD to assess chronicity of LAD occlusion. Based on wire behavior, appears to be chronic   Continue optimal medical management for now. Fortunately, he does not have any LM involvement and EF is normal. Any revascularization recommendation will be for symptomatic improvement.  In addition to optimal medical therapy, following are the options for revascularization   #1. CABG to distal LAD, ?diag2, OM1-if graftable #2. PCI to LCx +/- OM1, stage LAD CTO PCI   CABG more likely to achieve complete revascularization with less procedures. Will continue outpatient discussion re: optimal revascularization   LABORATORY DATA: CBC Latest Ref Rng & Units 01/14/2021 01/12/2021 01/11/2021  WBC 4.0 - 10.5 K/uL 6.2 8.5 7.5  Hemoglobin 13.0 - 17.0 g/dL  10.2(L) 10.3(L) 10.2(L)  Hematocrit 39.0 - 52.0 % 30.3(L) 31.1(L) 29.7(L)  Platelets 150 - 400 K/uL 120(L) 90(L) 90(L)    CMP Latest Ref Rng & Units 02/21/2021 01/14/2021 01/13/2021  Glucose 65 - 99 mg/dL 80 83 100(H)  BUN 8 - 27 mg/dL _0 Creatinine 0.76 - 1.27 mg/dL 1.12 1.00 0.83  Sodium 134 - 144 mmol/L 137 131(L) 131(L)  Potassium 3.5 - 5.2 mmol/L 4.2 4.0 3.8  Chloride 96 - 106 mmol/L 102 102 102  CO2 20 - 29 mmol/L 23 21(L) 26  Calcium 8.6 - 10.2 mg/dL 10.1 8.5(L) 8.4(L)  Total Protein 6.0 - 8.5 g/dL 7.4 - -  Total Bilirubin 0.0 - 1.2 mg/dL 0.5 - -  Alkaline Phos 44 - 121 IU/L 108 - -  AST 0 - 40 IU/L 15 - -  ALT 0 - 44 IU/L 14 - -    Lipid Panel  Lab Results  Component Value Date   CHOL 97 (L) 02/21/2021   HDL 31 (L) 02/21/2021   LDLCALC 47 02/21/2021   LDLDIRECT 52 02/21/2021   TRIG 98 02/21/2021   CHOLHDL 4 07/02/2018    No components found for: NTPROBNP Recent Labs    12/19/20 1122  PROBNP 99   No results for input(s): TSH in the last 8760 hours.  BMP Recent Labs    01/12/21 0444 01/13/21 1014 01/14/21 0326 02/21/21 0900  NA 135 131* 131* 137  K 3.8 3.8 4.0 4.2  CL 107 102 102 102  CO2 22 26 21* 23  GLUCOSE 107* 100* 83 80  BUN _1 CREATININE 0.86 0.83 1.00 1.12  CALCIUM 8.4* 8.4* 8.5* 10.1  GFRNONAA >60 >60 >60  --     HEMOGLOBIN A1C Lab Results  Component Value Date   HGBA1C 5.3 01/06/2021   MPG 105.41 01/06/2021   External Labs: Collected: 11/09/2020, received from PCP Hemoglobin 15.8 g/dL, hematocrit 48% Platelets 143 Creatinine 0.93 mg/dL. eGFR: 103 mL/min per 1.73 m Potassium 4.9 AST 23, ALT 40, alkaline phosphatase 108 Lipid profile: Total cholesterol 191, triglycerides 91, HDL 47, LDL 125, non-HDL 144 Hemoglobin A1c: 5.3 TSH: 1.07  IMPRESSION:    ICD-10-CM   1. Coronary artery disease involving native coronary artery of native heart with angina pectoris (HCC)  I25.119 metoprolol succinate (TOPROL XL) 25 MG 24 hr  tablet    2. Hx of CABG  Z95.1     3. Palpitations  R00.2 metoprolol succinate (TOPROL XL) 25 MG 24 hr tablet  4. Atherosclerosis of aorta (HCC)  I70.0     5. Mixed hyperlipidemia  E78.2     6. Essential hypertension  I10     7. Former smoker  Z87.891        RECOMMENDATIONS: Eugene Mccullough is a 60 y.o. 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  Coronary artery disease involving native coronary artery of native heart with angina pectoris [facility]) Status post four-vessel bypass 01/10/2021 and mediastinal exploration 01/11/2021. Denies active chest pain during today's encounter EKG nonischemic.   Denies chest pain. No use of sublingual nitroglycerin tablets. Chest pain/throat discomfort has improved since initiation of medications for H. pylori infection Currently on dual antiplatelet therapy Continue beta-blockers. Has completed cardiac rehab since last office visit.  Hx of CABG (CABG X 4.  LIMA-LAD, Left radial artery - OM2 (proximal of diagonal vein graft), RSVG D1, and D2). See above. Secondary prevention.  Palpitations: Still present 14-day extended Holter monitor has not been completed.  We will reset this up. We will transition him from Lopressor 25 mg p.o. twice daily to Toprol-XL 25 mg p.o. daily as he complains of symptoms of lightheaded and dizziness with changing positions at times.  And his blood pressures are soft at today's visit. No identifiable reversible cause. I suspect it is most likely secondary to anxiety and/or rare PACs/PVCs. Monitor for now.  Atherosclerosis of aorta (HCC) Continue statin therapy, LDL currently at goal.  Essential hypertension Office blood pressures soft. Continue current medical therapy. Low-salt  diet recommended.  Mixed hyperlipidemia Continue atorvastatin. Most recent LDL <70 mg/dL.  Former smoker Educated on the importance of continued smoking cessation.  FINAL MEDICATION LIST END OF ENCOUNTER: Meds ordered this encounter  Medications   metoprolol succinate (TOPROL XL) 25 MG 24 hr tablet    Sig: Take 1 tablet (25 mg total) by mouth every morning.    Dispense:  90 tablet    Refill:  0      Current Outpatient Medications:    amLODipine (NORVASC) 5 MG tablet, Take 1 tablet (5 mg total) by mouth daily., Disp: 90 tablet, Rfl: 1   amoxicillin (AMOXIL) 500 MG capsule, Take by mouth 2 (two) times daily., Disp: , Rfl:    aspirin EC 81 MG tablet, Take 1 tablet (81 mg total) by mouth daily. Swallow whole., Disp: 90 tablet, Rfl: 3   atorvastatin (LIPITOR) 40 MG tablet, Take 1 tablet (40 mg total) by mouth at bedtime., Disp: 90 tablet, Rfl: 1   clarithromycin (BIAXIN) 500 MG tablet, SMARTSIG:1 Tablet(s) By Mouth Every 12 Hours, Disp: , Rfl:    clopidogrel (PLAVIX) 75 MG tablet, Take 1 tablet (75 mg total) by mouth daily., Disp: 90 tablet, Rfl: 3   finasteride (PROSCAR) 5 MG tablet, TAKE 1 TABLET BY MOUTH EVERY DAY, Disp: 90 tablet, Rfl: 1   gabapentin (NEURONTIN) 100 MG capsule, Take 100 mg by mouth 2 (two) times daily., Disp: , Rfl:    metoprolol succinate (TOPROL XL) 25 MG 24 hr tablet, Take 1 tablet (25 mg total) by mouth every morning., Disp: 90 tablet, Rfl: 0   nitroGLYCERIN (NITROSTAT) 0.4 MG SL tablet, PLACE 1 TABLET (0.4 MG TOTAL) UNDER THE TONGUE EVERY 5 (FIVE) MINUTES AS NEEDED FOR CHEST PAIN. IF YOU REQUIRE MORE THAN TWO TABLETS FIVE MINUTES APART GO TO THE NEAREST  ER VIA EMS., Disp: 25 tablet, Rfl: 0   pantoprazole (PROTONIX) 40 MG tablet, Take 40 mg by mouth 2 (two) times daily., Disp: , Rfl:    tamsulosin (FLOMAX) 0.4 MG CAPS capsule, Take 0.4 mg by mouth daily., Disp: , Rfl:   No orders of the defined types were placed in this encounter.  There are no Patient  Instructions on file for this visit.   --Continue cardiac medications as reconciled in final medication list. --Return in about 6 months (around 12/24/2021) for Follow up, CAD. Or sooner if needed. --Continue follow-up with your primary care physician regarding the management of your other chronic comorbid conditions.  Patient's questions and concerns were addressed to his satisfaction. He voices understanding of the instructions provided during this encounter.   This note was created using a voice recognition software as a result there may be grammatical errors inadvertently enclosed that do not reflect the nature of this encounter. Every attempt is made to correct such errors.  Rex Kras, [place], [facility]  Pager: [phone] Office: [phone]

## 2021-06-29 ENCOUNTER — Inpatient Hospital Stay: Payer: 59

## 2021-06-29 DIAGNOSIS — R002 Palpitations: Secondary | ICD-10-CM

## 2021-07-11 ENCOUNTER — Inpatient Hospital Stay: Payer: 59

## 2021-07-11 ENCOUNTER — Other Ambulatory Visit: Payer: Self-pay

## 2021-08-13 ENCOUNTER — Other Ambulatory Visit: Payer: Self-pay | Admitting: Cardiology

## 2021-08-13 ENCOUNTER — Other Ambulatory Visit: Payer: Self-pay | Admitting: Physician Assistant

## 2021-08-13 DIAGNOSIS — R072 Precordial pain: Secondary | ICD-10-CM

## 2021-08-13 DIAGNOSIS — I25119 Atherosclerotic heart disease of native coronary artery with unspecified angina pectoris: Secondary | ICD-10-CM

## 2021-08-13 DIAGNOSIS — I25118 Atherosclerotic heart disease of native coronary artery with other forms of angina pectoris: Secondary | ICD-10-CM

## 2021-08-13 DIAGNOSIS — I10 Essential (primary) hypertension: Secondary | ICD-10-CM

## 2021-08-13 DIAGNOSIS — R002 Palpitations: Secondary | ICD-10-CM

## 2021-08-13 DIAGNOSIS — I2510 Atherosclerotic heart disease of native coronary artery without angina pectoris: Secondary | ICD-10-CM

## 2021-08-14 ENCOUNTER — Other Ambulatory Visit: Payer: Self-pay

## 2021-08-14 DIAGNOSIS — I25119 Atherosclerotic heart disease of native coronary artery with unspecified angina pectoris: Secondary | ICD-10-CM

## 2021-08-14 DIAGNOSIS — R002 Palpitations: Secondary | ICD-10-CM

## 2021-08-14 DIAGNOSIS — I2510 Atherosclerotic heart disease of native coronary artery without angina pectoris: Secondary | ICD-10-CM

## 2021-08-14 MED ORDER — METOPROLOL SUCCINATE ER 25 MG PO TB24: 25.0000 mg | ORAL_TABLET | Freq: Every morning | ORAL | 0 refills | Status: DC

## 2021-08-14 MED ORDER — ATORVASTATIN CALCIUM 40 MG PO TABS: 40.0000 mg | ORAL_TABLET | Freq: Every day | ORAL | 1 refills | Status: DC

## 2021-08-24 ENCOUNTER — Ambulatory Visit: Payer: 59 | Admitting: Cardiology

## 2021-08-31 ENCOUNTER — Other Ambulatory Visit: Payer: Self-pay

## 2021-08-31 ENCOUNTER — Ambulatory Visit: Payer: 59 | Admitting: Cardiology

## 2021-09-08 ENCOUNTER — Encounter: Payer: Self-pay | Admitting: Cardiology

## 2021-09-08 ENCOUNTER — Ambulatory Visit: Payer: 59 | Admitting: Cardiology

## 2021-09-08 ENCOUNTER — Other Ambulatory Visit: Payer: Self-pay

## 2021-09-08 VITALS — BP 120/81 | HR 71 | Resp 16 | Ht 66.0 in | Wt 181.8 lb

## 2021-09-08 DIAGNOSIS — I25119 Atherosclerotic heart disease of native coronary artery with unspecified angina pectoris: Secondary | ICD-10-CM

## 2021-09-08 DIAGNOSIS — E782 Mixed hyperlipidemia: Secondary | ICD-10-CM

## 2021-09-08 DIAGNOSIS — R002 Palpitations: Secondary | ICD-10-CM

## 2021-09-08 DIAGNOSIS — I10 Essential (primary) hypertension: Secondary | ICD-10-CM

## 2021-09-08 DIAGNOSIS — Z951 Presence of aortocoronary bypass graft: Secondary | ICD-10-CM

## 2021-09-08 DIAGNOSIS — Z87891 Personal history of nicotine dependence: Secondary | ICD-10-CM

## 2021-09-08 DIAGNOSIS — I700 Atherosclerosis of aorta: Secondary | ICD-10-CM

## 2021-09-08 NOTE — Progress Notes (Signed)
ID:  Eugene Mccullough, DOB Aug 27, 1961, MRN [id number]  PCP:  Malena Peer, MD  Cardiologist:  Rex Kras, DO, [facility] (established care 11/25/2020) Former Cardiology Providers: Dr. Lyman Bishop   Date: 09/08/21 Last Office Visit: 06/26/2021  Chief Complaint  Patient presents with   Coronary Artery Disease   Follow-up    HPI  Eugene Mccullough is a 61 y.o. African-American male who presents to the office with a chief complaint of " 34-monthfollow-up for palpitations and discuss test results." Patient's past medical history and cardiovascular risk factors include: Multivessel CAD status post four-vessel bypass surgery, HTN, HLD, BPH, mild coronary artery calcification, 26-pack-year history of smoking (quit in 2014), obesity due to excess calories.  He is referred to the office at the request of [name], ABebe Shaggy MD for evaluation of chest pain.  Initially presented with chest pain/heartburn which were suggestive of CAD.  He underwent an ischemic evaluation and was noted to have multivessel CAD on his coronary CTA.  He subsequently underwent left heart catheterization followed by four-vessel bypass with Dr. LKipp Broodon Jan 10, 2021.  After his four-vessel bypass his chest tube output was high and therefore he underwent mediastinal reexploration the following day and the source was cauterized but he required 4 units of PRBC postoperatively.  Due to continued chest pain/throat discomfort and multiple office visit the shared decision was to proceed with myocardial perfusion imaging to evaluate for reversible ischemia.  He underwent stress testing which was reported to be overall low risk.  Thereafter he was referred to GI for additional evaluation for possible GERD.  He was diagnosed with H. pylori infection and has completed treatment.  His symptoms have improved but not resolved.  Patient states that it is most likely heartburn and he plans on following up with GI in the near future.  With  regards to palpitations I suspect it is most likely secondary to underlying GERD.  Was started on metoprolol tartrate 25 mg p.o. twice daily but he was experiencing lightheaded and dizziness or change in position and at the last office visit it was transitioned to Toprol-XL 25 mg p.o. daily.  He is tolerating Toprol-XL better.  He also underwent an extended Holter monitor which showed no evidence of atrial fibrillation during the monitoring period.  And the overall PVC burden was also less than 1%.  The monitor results including the rhythm strips were reviewed with the patient at today's office visit and noted below for further reference.  Patient states that he continues to have episodes of palpitations, but they are intermittent, lasting for a few minutes, more predominant while he is trying to fall asleep or in a quiet surrounding.  Denies any near-syncope or syncopal events.  FUNCTIONAL STATUS: Walks 20-242mutes daily.   ALLERGIES: Allergies  Allergen Reactions   Hydrocodone-Acetaminophen Itching   Oxycodone Itching    MEDICATION LIST PRIOR TO VISIT: Current Meds  Medication Sig   amLODipine (NORVASC) 5 MG tablet Take 1 tablet (5 mg total) by mouth daily.   aspirin EC 81 MG tablet Take 1 tablet (81 mg total) by mouth daily. Swallow whole.   atorvastatin (LIPITOR) 40 MG tablet Take 1 tablet (40 mg total) by mouth at bedtime.   clopidogrel (PLAVIX) 75 MG tablet Take 1 tablet (75 mg total) by mouth daily.   finasteride (PROSCAR) 5 MG tablet TAKE 1 TABLET BY MOUTH EVERY DAY   gabapentin (NEURONTIN) 100 MG capsule Take 100 mg by mouth 2 (two) times daily.  metoprolol succinate (TOPROL XL) 25 MG 24 hr tablet Take 1 tablet (25 mg total) by mouth every morning.   nitroGLYCERIN (NITROSTAT) 0.4 MG SL tablet PLACE 1 TABLET (0.4 MG TOTAL) UNDER THE TONGUE EVERY 5 (FIVE) MINUTES AS NEEDED FOR CHEST PAIN. IF YOU REQUIRE MORE THAN TWO TABLETS FIVE MINUTES APART GO TO THE NEAREST ER VIA EMS.    pantoprazole (PROTONIX) 40 MG tablet Take 40 mg by mouth 2 (two) times daily.   tamsulosin (FLOMAX) 0.4 MG CAPS capsule Take 0.4 mg by mouth daily.   [DISCONTINUED] clarithromycin (BIAXIN) 500 MG tablet SMARTSIG:1 Tablet(s) By Mouth Every 12 Hours     PAST MEDICAL HISTORY: Past Medical History:  Diagnosis Date   Anemia    Anginal pain (HCC)    Arthritis    BPH (benign prostatic hyperplasia)    Coronary artery disease    Eczema    Gastric ulcer    GERD (gastroesophageal reflux disease)    Gout    H. pylori infection    Hyperlipidemia    Hypertension    IBS (irritable bowel syndrome)    Pre-diabetes    Sleep apnea    Does not use CPAP    PAST SURGICAL HISTORY: Past Surgical History:  Procedure Laterality Date   CARDIAC CATHETERIZATION     2022   CHOLECYSTECTOMY  2005   COLONOSCOPY  08/2010   CORONARY ARTERY BYPASS GRAFT N/A 01/10/2021   Procedure: CORONARY ARTERY BYPASS GRAFTING (CABG) X  FOUR ON PUMP USING LEFT INTERNAL MAMMARY ARTERY, LEFT RADIAL ARTERY, RIGHT ENDOSCOPIC GREATER SAPHENOUS VEIN HARVEST CONDUITS;  Surgeon: Lajuana Matte, MD;  Location: [place];  Service: Open Heart Surgery;  Laterality: N/A;   EXPLORATION POST OPERATIVE OPEN HEART N/A 01/11/2021   Procedure: , MEDIASTINAL WASHOUT;  Surgeon: Lajuana Matte, MD;  Location: [place];  Service: Open Heart Surgery;  Laterality: N/A;   LEFT HEART CATH AND CORONARY ANGIOGRAPHY N/A 12/21/2020   Procedure: LEFT HEART CATH AND CORONARY ANGIOGRAPHY;  Surgeon: Nigel Mormon, MD;  Location: [place] [facility];  Service: Cardiovascular;  Laterality: N/A;   RADIAL ARTERY HARVEST Left 01/10/2021   Procedure: RADIAL ARTERY HARVEST;  Surgeon: Lajuana Matte, MD;  Location: [place];  Service: Open Heart Surgery;  Laterality: Left;   TEE WITHOUT CARDIOVERSION N/A 01/10/2021   Procedure: TRANSESOPHAGEAL ECHOCARDIOGRAM (TEE);  Surgeon: Lajuana Matte, MD;  Location: [place];  Service: Open Heart Surgery;   Laterality: N/A;   UPPER GI ENDOSCOPY  2019   WISDOM TOOTH EXTRACTION      FAMILY HISTORY: The patient family history includes Diabetes in his brother and sister; Eating disorder in his daughter; Eczema in his son; Hyperlipidemia in his mother; Hypertension in his sister; Irritable bowel syndrome in his son; Obesity in his sister.  SOCIAL HISTORY:  The patient  reports that he quit smoking about 9 years ago. His smoking use included cigarettes. He has a 20.00 pack-year smoking history. He has never used smokeless tobacco. He reports that he does not drink alcohol and does not use drugs.  REVIEW OF SYSTEMS: Review of Systems  Constitutional: Negative for chills and fever.  HENT:  Negative for hoarse voice and nosebleeds.   Eyes:  Negative for discharge, double vision and pain.  Cardiovascular:  Positive for palpitations (improving). Negative for chest pain, claudication, dyspnea on exertion, leg swelling, near-syncope, orthopnea, paroxysmal nocturnal dyspnea and syncope.  Respiratory:  Negative for hemoptysis and shortness of breath.   Musculoskeletal:  Negative  for muscle cramps and myalgias.  Gastrointestinal:  Negative for abdominal pain, constipation, diarrhea, heartburn, hematemesis, hematochezia, melena, nausea and vomiting.  Neurological:  Negative for dizziness and light-headedness.   PHYSICAL EXAM: Vitals with BMI 09/08/2021 06/26/2021 05/23/2021  Height '5\' 6"'  '5\' 6"'  '5\' 6"'   Weight 181 lbs 13 oz 177 lbs 177 lbs 13 oz  BMI 29.36 91.91 66.06  Systolic 004 599 774  Diastolic 81 72 74  Pulse 71 71 62    CONSTITUTIONAL: Well-developed and well-nourished. No acute distress.  SKIN: Skin is warm and dry. No rash noted. No cyanosis. No pallor. No jaundice HEAD: Normocephalic and atraumatic.  EYES: No scleral icterus MOUTH/THROAT: Moist oral membranes.  NECK: No JVD present. No thyromegaly noted. No carotid bruits  LYMPHATIC: No visible cervical adenopathy.  CHEST Normal respiratory  effort. No intercostal retractions.  Sternotomy site is healing well, incision site is clean dry and intact. LUNGS: Clear to auscultation bilaterally.  No stridor. No wheezes. No rales.  CARDIOVASCULAR: Regular, positive S1-S2, no murmurs rubs or gallops appreciated ABDOMINAL: No apparent ascites.  EXTREMITIES: No peripheral edema.  Left radial site healing well, clean dry and intact.  Right lower extremity harvesting site has some induration but no signs of infection. HEMATOLOGIC: No significant bruising NEUROLOGIC: Oriented to person, place, and time. Nonfocal. Normal muscle tone.  PSYCHIATRIC: Normal mood and affect. Normal behavior. Cooperative  CARDIAC DATABASE: Coronary artery bypass grafting surgery: Jan 10, 2021 by Dr. [name] LIMA-LAD, Left radial artery - OM2 (proximal of diagonal vein graft), RSVG D1, and D2.   EKG: 09/08/2021: NSR, 62 bpm, nonspecific T wave abnormality, without underlying injury pattern.   Echocardiogram: 12/13/2020: Normal LV systolic function with visual EF 50-55%. Left ventricle cavity is normal in size. Mild left ventricular hypertrophy. Normal global wall motion. Indeterminate diastolic filling pattern, normal LAP. Left atrial cavity is severely dilated. Mild (Grade I) mitral regurgitation. Mild tricuspid regurgitation. No evidence of pulmonary hypertension. Mild pulmonic regurgitation. IVC is dilated with a respiratory response of >50%. No prior study for comparison.    Stress Testing: Exercise Tetrofosmin stress test 05/31/2021: Exercise nuclear stress test was performed using Bruce protocol. Patient reached 7 METS, and 86% of age predicted maximum heart rate. Exercise capacity was low. Non-limiting chest pain reported. Heart rate and hemodynamic response were normal. Stress EKG revealed no ischemic changes. Normal myocardial perfusion. Stress LVEF 54%. Low risk study. Non-limiting chest pain did not correlate with EKG or SPECT evidence of ischemia.    Coronary CTA 12/14/2020: 1. Coronary calcium score of 24.6 AU. This was 70th percentile for age and sex matched control. 2. Normal coronary origin with right dominance. 3. CAD-RADS = 4. Severe stenosis (70-99%) at the proximal /mid LAD due to mixed plaque. First diagonal branch patent but diffuse disease within the ostial to proximal segment. Second diagonal branch is overall patent. Diffuse disease within the proximal to mid segment LCx. RCA without evidence of plaque or stenosis. 4. Study is sent for CT-FFR to further evaluate the LAD, D1, and LCx. Findings will be performed and reported separately.  Noncardiac impressions: 1. No acute findings in the imaged extracardiac chest. 2. Moderate hiatal hernia. 3. Mild hepatic steatosis. 4. Aortic Atherosclerosis (ICD10-I70.0).  Coronary CT FFR 12/15/2020: CT FFR analysis showed significant stenosis at mid LAD (modeled as total occlusion), mid-to-distal LCX, and acute marginal modeled as total occlusion.  Heart Catheterization: 12/21/2020: LM: Normal LAD: Mid 100% occlusion after Diag 2         Collaterals to  distal LAD from Diag 2 and RPDA         High Diag 1 patent         Diag 2 diffuse 60-70% disease LCx: Proximal tandem 75% stenosis        Undefilled and sub-totally occluded OM1        Mid LCx 7% stenosis RCA: Mild luminal irregularities   Attempted wiring of LAD to assess chronicity of LAD occlusion. Based on wire behavior, appears to be chronic   Continue optimal medical management for now. Fortunately, he does not have any LM involvement and EF is normal. Any revascularization recommendation will be for symptomatic improvement.  In addition to optimal medical therapy, following are the options for revascularization   #1. CABG to distal LAD, ?diag2, OM1-if graftable #2. PCI to LCx +/- OM1, stage LAD CTO PCI   CABG more likely to achieve complete revascularization with less procedures. Will continue outpatient discussion  re: optimal revascularization  14 day extended Holter monitor: Dominant rhythm normal sinus. Heart rate 40-133 bpm. Avg HR 66 bpm. No atrial fibrillation, supraventricular or ventricular tachycardia, high grade AV block, pauses (3 seconds or longer). Total ventricular ectopic burden <1%. Total supraventricular ectopic burden <1%. Patient triggered events: 39. Either normal sinus rhythm or sinus tachycardia with rare PACs/PVCs.  LABORATORY DATA: CBC Latest Ref Rng & Units 01/14/2021 01/12/2021 01/11/2021  WBC 4.0 - 10.5 K/uL 6.2 8.5 7.5  Hemoglobin 13.0 - 17.0 g/dL 10.2(L) 10.3(L) 10.2(L)  Hematocrit 39.0 - 52.0 % 30.3(L) 31.1(L) 29.7(L)  Platelets 150 - 400 K/uL 120(L) 90(L) 90(L)    CMP Latest Ref Rng & Units 02/21/2021 01/14/2021 01/13/2021  Glucose 65 - 99 mg/dL 80 83 100(H)  BUN 8 - 27 mg/dL '13 13 9  ' Creatinine 0.76 - 1.27 mg/dL 1.12 1.00 0.83  Sodium 134 - 144 mmol/L 137 131(L) 131(L)  Potassium 3.5 - 5.2 mmol/L 4.2 4.0 3.8  Chloride 96 - 106 mmol/L 102 102 102  CO2 20 - 29 mmol/L 23 21(L) 26  Calcium 8.6 - 10.2 mg/dL 10.1 8.5(L) 8.4(L)  Total Protein 6.0 - 8.5 g/dL 7.4 - -  Total Bilirubin 0.0 - 1.2 mg/dL 0.5 - -  Alkaline Phos 44 - 121 IU/L 108 - -  AST 0 - 40 IU/L 15 - -  ALT 0 - 44 IU/L 14 - -    Lipid Panel  Lab Results  Component Value Date   CHOL 97 (L) 02/21/2021   HDL 31 (L) 02/21/2021   LDLCALC 47 02/21/2021   LDLDIRECT 52 02/21/2021   TRIG 98 02/21/2021   CHOLHDL 4 07/02/2018    No components found for: NTPROBNP Recent Labs    12/19/20 1122  PROBNP 99   No results for input(s): TSH in the last 8760 hours.  BMP Recent Labs    01/12/21 0444 01/13/21 1014 01/14/21 0326 02/21/21 0900  NA 135 131* 131* 137  K 3.8 3.8 4.0 4.2  CL 107 102 102 102  CO2 22 26 21* 23  GLUCOSE 107* 100* 83 80  BUN '6 9 13 13  ' CREATININE 0.86 0.83 1.00 1.12  CALCIUM 8.4* 8.4* 8.5* 10.1  GFRNONAA >60 >60 >60  --     HEMOGLOBIN A1C Lab Results  Component Value Date    HGBA1C 5.3 01/06/2021   MPG 105.41 01/06/2021   External Labs: Collected: 11/09/2020, received from PCP Hemoglobin 15.8 g/dL, hematocrit 48% Platelets 143 Creatinine 0.93 mg/dL. eGFR: 103 mL/min per 1.73 m Potassium 4.9 AST 23, ALT  40, alkaline phosphatase 108 Lipid profile: Total cholesterol 191, triglycerides 91, HDL 47, LDL 125, non-HDL 144 Hemoglobin A1c: 5.3 TSH: 1.07  IMPRESSION:    ICD-10-CM   1. Coronary artery disease involving native coronary artery of native heart with angina pectoris ([place])  I25.119 EKG 12-Lead    2. Hx of CABG  Z95.1     3. Palpitations  R00.2     4. Atherosclerosis of aorta (HCC)  I70.0     5. Essential hypertension  I10     6. Mixed hyperlipidemia  E78.2     7. Former smoker  Z87.891        RECOMMENDATIONS: Eugene Mccullough is a 61 y.o. 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  Coronary artery disease involving native coronary artery of native heart with angina pectoris [facility]) Status post four-vessel bypass 01/10/2021 and mediastinal exploration 01/11/2021. Denies angina pectoris, no use of sublingual nitroglycerin tablets. EKG nonischemic.   Currently on dual antiplatelet therapy Has completed cardiac rehab since last office visit.  His physical activity has been reduced due to cold weather and decreased motivation.  Patient states that he plans to increase physical activity by going to the local gym and once the weather warms up he enjoys walking in the park. Medications reconciled. No additional testing warranted at this time.  Hx of CABG (CABG X 4.  LIMA-LAD, Left radial artery - OM2 (proximal of diagonal vein graft), RSVG D1, and D2). See above. Secondary prevention.  Palpitations: Present, but  improving  14-day extended Holter monitor results reviewed including the rhythm strips with the patient at today's office visit.   39 patient triggered events during the monitoring noted either sinus rhythm or sinus tachycardia with rare PACs and PVCs at times.  Total PVC burden is less than 1%.  Therefore the shared decision was to continue current medical therapy without up titration. Recommend follow-up with GI with regards to the management of H. pylori and heartburn.   No identifiable reversible cause. Monitor for now.  Atherosclerosis of aorta (HCC) Continue statin therapy, LDL currently at goal.  Essential hypertension Office blood pressures well controlled. Continue current medical therapy. Low-salt diet recommended.  Mixed hyperlipidemia Continue atorvastatin. Most recent LDL <70 mg/dL.  Former smoker Educated on the importance of continued smoking cessation.  FINAL MEDICATION LIST END OF ENCOUNTER: No orders of the defined types were placed in this encounter.    Current Outpatient Medications:    amLODipine (NORVASC) 5 MG tablet, Take 1 tablet (5 mg total) by mouth daily., Disp: 90 tablet, Rfl: 1   aspirin EC 81 MG tablet, Take 1 tablet (81 mg total) by mouth daily. Swallow whole., Disp: 90 tablet, Rfl: 3   atorvastatin (LIPITOR) 40 MG tablet, Take 1 tablet (40 mg total) by mouth at bedtime., Disp: 90 tablet, Rfl: 1   clopidogrel (PLAVIX) 75 MG tablet, Take 1 tablet (75 mg total) by mouth daily., Disp: 90 tablet, Rfl: 3   finasteride (PROSCAR) 5 MG tablet, TAKE 1 TABLET BY MOUTH EVERY DAY, Disp: 90 tablet, Rfl: 1   gabapentin (NEURONTIN) 100 MG capsule, Take 100 mg by mouth 2 (two) times daily., Disp: , Rfl:    metoprolol succinate (TOPROL XL) 25 MG 24 hr tablet, Take 1 tablet (25 mg total) by mouth every morning., Disp: 90 tablet,  Rfl: 0   nitroGLYCERIN (NITROSTAT) 0.4 MG SL tablet, PLACE 1 TABLET (0.4 MG TOTAL) UNDER THE TONGUE EVERY 5 (FIVE) MINUTES AS NEEDED FOR CHEST  PAIN. IF YOU REQUIRE MORE THAN TWO TABLETS FIVE MINUTES APART GO TO THE NEAREST ER VIA EMS., Disp: 25 tablet, Rfl: 0   pantoprazole (PROTONIX) 40 MG tablet, Take 40 mg by mouth 2 (two) times daily., Disp: , Rfl:    tamsulosin (FLOMAX) 0.4 MG CAPS capsule, Take 0.4 mg by mouth daily., Disp: , Rfl:   Orders Placed This Encounter  Procedures   EKG 12-Lead    There are no Patient Instructions on file for this visit.   --Continue cardiac medications as reconciled in final medication list. --Return in about 6 months (around 03/08/2022) for Follow up, CAD. Or sooner if needed. --Continue follow-up with your primary care physician regarding the management of your other chronic comorbid conditions.  Patient's questions and concerns were addressed to his satisfaction. He voices understanding of the instructions provided during this encounter.   This note was created using a voice recognition software as a result there may be grammatical errors inadvertently enclosed that do not reflect the nature of this encounter. Every attempt is made to correct such errors.  Total time spent: 32 minutes  Mechele Claude [facility]  Pager: [phone] Office: [phone]

## 2021-09-13 ENCOUNTER — Ambulatory Visit: Payer: 59 | Admitting: Cardiology

## 2021-10-03 ENCOUNTER — Ambulatory Visit (INDEPENDENT_AMBULATORY_CARE_PROVIDER_SITE_OTHER): Payer: Self-pay | Admitting: Emergency Medicine

## 2021-10-03 ENCOUNTER — Encounter: Payer: Self-pay | Admitting: Emergency Medicine

## 2021-10-03 ENCOUNTER — Other Ambulatory Visit: Payer: Self-pay

## 2021-10-03 VITALS — BP 102/62 | HR 88 | Temp 98.4°F | Ht 66.0 in | Wt 184.0 lb

## 2021-10-03 DIAGNOSIS — Z7689 Persons encountering health services in other specified circumstances: Secondary | ICD-10-CM

## 2021-10-03 DIAGNOSIS — N401 Enlarged prostate with lower urinary tract symptoms: Secondary | ICD-10-CM

## 2021-10-03 DIAGNOSIS — N138 Other obstructive and reflux uropathy: Secondary | ICD-10-CM

## 2021-10-03 DIAGNOSIS — I25119 Atherosclerotic heart disease of native coronary artery with unspecified angina pectoris: Secondary | ICD-10-CM

## 2021-10-03 DIAGNOSIS — Z951 Presence of aortocoronary bypass graft: Secondary | ICD-10-CM

## 2021-10-03 DIAGNOSIS — I10 Essential (primary) hypertension: Secondary | ICD-10-CM

## 2021-10-03 DIAGNOSIS — Z8679 Personal history of other diseases of the circulatory system: Secondary | ICD-10-CM | POA: Insufficient documentation

## 2021-10-03 DIAGNOSIS — K2100 Gastro-esophageal reflux disease with esophagitis, without bleeding: Secondary | ICD-10-CM

## 2021-10-03 NOTE — Assessment & Plan Note (Addendum)
Stable.  No recent anginal episodes.  No recent use of nitroglycerin. Status post CABG last year.  Sees cardiologist on a regular basis. On Plavix and aspirin.

## 2021-10-03 NOTE — Progress Notes (Signed)
Eugene Mccullough 61 y.o.   Chief Complaint  Patient presents with   New Patient (Initial Visit)    Pt would like to discuss sore throat, hx of acid reflux.    HISTORY OF PRESENT ILLNESS: This is a 61 y.o. male first visit to this office once to establish care with me. Has the following chronic medical problems: 1.  GERD for at least 20 years.  Still active.  Presently taking pantoprazole 40 mg twice a day. Last upper endoscopy at least 2 years ago. 2.  BPH with lower urinary tract symptoms.  Presently taking Flomax and Proscar with good results.  Stable. 3.  History of coronary artery disease status post CABG last year.  Presently on beta-blocker, Plavix, and daily aspirin.  Also taking atorvastatin 40 mg daily. 4.  History of hypertension: On metoprolol succinate and amlodipine.  HPI   Prior to Admission medications   Medication Sig Start Date End Date Taking? Authorizing Provider  amLODipine (NORVASC) 5 MG tablet Take 1 tablet (5 mg total) by mouth daily. 03/17/21  Yes Tolia, Sunit, DO  aspirin EC 81 MG tablet Take 1 tablet (81 mg total) by mouth daily. Swallow whole. 02/15/21  Yes Tolia, Sunit, DO  atorvastatin (LIPITOR) 40 MG tablet Take 1 tablet (40 mg total) by mouth at bedtime. 08/14/21  Yes Tolia, Sunit, DO  clopidogrel (PLAVIX) 75 MG tablet Take 1 tablet (75 mg total) by mouth daily. 02/15/21  Yes Tolia, Sunit, DO  finasteride (PROSCAR) 5 MG tablet TAKE 1 TABLET BY MOUTH EVERY DAY 06/25/19  Yes Janith Lima, MD  gabapentin (NEURONTIN) 100 MG capsule Take 100 mg by mouth 2 (two) times daily. 05/06/21  Yes [provider]  metoprolol succinate (TOPROL XL) 25 MG 24 hr tablet Take 1 tablet (25 mg total) by mouth every morning. 08/14/21 11/12/21 Yes Tolia, Sunit, DO  nitroGLYCERIN (NITROSTAT) 0.4 MG SL tablet PLACE 1 TABLET (0.4 MG TOTAL) UNDER THE TONGUE EVERY 5 (FIVE) MINUTES AS NEEDED FOR CHEST PAIN. IF YOU REQUIRE MORE THAN TWO TABLETS FIVE MINUTES APART GO TO THE NEAREST  ER VIA EMS. 01/16/21 10/03/21 Yes Adrian Prows, MD  pantoprazole (PROTONIX) 40 MG tablet Take 40 mg by mouth 2 (two) times daily.   Yes [provider]  tamsulosin (FLOMAX) 0.4 MG CAPS capsule Take 0.4 mg by mouth daily.   Yes [provider]    Allergies  Allergen Reactions   Hydrocodone-Acetaminophen Itching   Oxycodone Itching    Patient Active Problem List   Diagnosis Date Noted   S/P CABG x 4 01/10/2021   Essential hypertension 01/06/2021   Mixed hyperlipidemia 01/06/2021   Anemia    BPH (benign prostatic hyperplasia)    Eczema    GERD (gastroesophageal reflux disease)    Hyperlipidemia    IBS (irritable bowel syndrome)    Sleep apnea    CAD (coronary artery disease) 12/20/2020   Precordial pain    DDD (degenerative disc disease), lumbar 08/15/2018   Pes cavus 08/15/2018   History of IBS 08/15/2018   Lesion of oral mucosa 08/14/2018   Generalized abdominal tenderness without rebound tenderness 08/14/2018   Borderline results on serologic testing for Lyme disease 07/05/2018   Arthralgia 07/02/2018   Lumbar radiculopathy 09/12/2017   PUD (peptic ulcer disease) 10/28/2014   Allergic rhinitis, cause unspecified 01/15/2014   Dermatitis, atopic 05/05/2013   Obstructive sleep apnea 02/02/2013   Routine general medical examination at a health care facility 01/02/2013   BPH (benign prostatic hypertrophy)  with urinary retention 01/02/2013   Hypertension 08/15/2011   Obesity (BMI 30-39.9)     Past Medical History:  Diagnosis Date   Anemia    Anginal pain (HCC)    Arthritis    BPH (benign prostatic hyperplasia)    Coronary artery disease    Eczema    Gastric ulcer    GERD (gastroesophageal reflux disease)    Gout    H. pylori infection    Hyperlipidemia    Hypertension    IBS (irritable bowel syndrome)    Pre-diabetes    Sleep apnea    Does not use CPAP    Past Surgical History:  Procedure Laterality Date   CARDIAC CATHETERIZATION     2022    CHOLECYSTECTOMY  2005   COLONOSCOPY  08/2010   CORONARY ARTERY BYPASS GRAFT N/A 01/10/2021   Procedure: CORONARY ARTERY BYPASS GRAFTING (CABG) X  FOUR ON PUMP USING LEFT INTERNAL MAMMARY ARTERY, LEFT RADIAL ARTERY, RIGHT ENDOSCOPIC GREATER SAPHENOUS VEIN HARVEST CONDUITS;  Surgeon: Lajuana Matte, MD;  Location: [facility];  Service: Open Heart Surgery;  Laterality: N/A;   EXPLORATION POST OPERATIVE OPEN HEART N/A 01/11/2021   Procedure: , MEDIASTINAL WASHOUT;  Surgeon: Lajuana Matte, MD;  Location: [facility];  Service: Open Heart Surgery;  Laterality: N/A;   LEFT HEART CATH AND CORONARY ANGIOGRAPHY N/A 12/21/2020   Procedure: LEFT HEART CATH AND CORONARY ANGIOGRAPHY;  Surgeon: Nigel Mormon, MD;  Location: [facility];  Service: Cardiovascular;  Laterality: N/A;   RADIAL ARTERY HARVEST Left 01/10/2021   Procedure: RADIAL ARTERY HARVEST;  Surgeon: Lajuana Matte, MD;  Location: [facility];  Service: Open Heart Surgery;  Laterality: Left;   TEE WITHOUT CARDIOVERSION N/A 01/10/2021   Procedure: TRANSESOPHAGEAL ECHOCARDIOGRAM (TEE);  Surgeon: Lajuana Matte, MD;  Location: [facility];  Service: Open Heart Surgery;  Laterality: N/A;   UPPER GI ENDOSCOPY  2019   WISDOM TOOTH EXTRACTION      Social History   Socioeconomic History   Marital status: Married    Spouse name: Not on file   Number of children: 3   Years of education: 17   Highest education level: Not on file  Occupational History   Occupation: driver  Tobacco Use   Smoking status: Former    Packs/day: 1.00    Years: 20.00    Pack years: 20.00    Types: Cigarettes    Quit date: 2014    Years since quitting: 9.1   Smokeless tobacco: Never  Vaping Use   Vaping Use: Never used  Substance and Sexual Activity   Alcohol use: No    Alcohol/week: 0.0 standard drinks   Drug use: Never   Sexual activity: Yes  Other Topics Concern   Not on file  Social History Narrative   Not on file   Social Determinants of  Health   Financial Resource Strain: Not on file  Food Insecurity: Not on file  Transportation Needs: Not on file  Physical Activity: Not on file  Stress: Not on file  Social Connections: Not on file  Intimate Partner Violence: Not on file    Family History  Problem Relation Age of Onset   Diabetes Brother    Hyperlipidemia Mother    Obesity Sister    Hypertension Sister    Diabetes Sister    Eating disorder Daughter    Eczema Son    Irritable bowel syndrome Son    Cancer Neg Hx    Early  death Neg Hx    Heart disease Neg Hx    Kidney disease Neg Hx    Stroke Neg Hx    Alcohol abuse Neg Hx    Colon cancer Neg Hx    Esophageal cancer Neg Hx    Stomach cancer Neg Hx    Rectal cancer Neg Hx      Review of Systems  Constitutional: Negative.  Negative for chills and fever.  HENT:  Positive for sore throat.   Respiratory: Negative.  Negative for cough, hemoptysis and shortness of breath.   Cardiovascular: Negative.  Negative for chest pain and palpitations.  Gastrointestinal:  Positive for heartburn. Negative for abdominal pain, blood in stool, melena, nausea and vomiting.  Genitourinary: Negative.   Skin: Negative.  Negative for rash.  Neurological: Negative.  Negative for dizziness and headaches.  All other systems reviewed and are negative.   Physical Exam Vitals reviewed.  Constitutional:      Appearance: Normal appearance.  HENT:     Head: Normocephalic.     Right Ear: Tympanic membrane, ear canal and external ear normal.     Left Ear: Tympanic membrane, ear canal and external ear normal.     Mouth/Throat:     Mouth: Mucous membranes are moist.     Pharynx: Oropharynx is clear.  Eyes:     Extraocular Movements: Extraocular movements intact.     Conjunctiva/sclera: Conjunctivae normal.     Pupils: Pupils are equal, round, and reactive to light.  Cardiovascular:     Rate and Rhythm: Normal rate and regular rhythm.     Pulses: Normal pulses.     Heart sounds:  Normal heart sounds.  Pulmonary:     Effort: Pulmonary effort is normal.     Breath sounds: Normal breath sounds.  Musculoskeletal:     Cervical back: Normal range of motion and neck supple.  Neurological:     Mental Status: He is alert.     ASSESSMENT & PLAN: A total of 47 minutes was spent with the patient and counseling/coordination of care regarding preparing for the visit, establishing care with me, review of past medical history, review of available office visit notes and medical records today, review of all medications, review of chronic medical conditions and treatments, education on nutrition, need for GI evaluation and upper endoscopy, prognosis, documentation and need for follow-up.  Problem List Items Addressed This Visit       Cardiovascular and Mediastinum   CAD (coronary artery disease)    Stable.  No recent anginal episodes.  No recent use of nitroglycerin. Status post CABG last year.  Sees cardiologist on a regular basis. On Plavix and aspirin.      Essential hypertension    Well-controlled hypertension. BP Readings from Last 3 Encounters:  10/03/21 102/62  09/08/21 120/81  06/26/21 103/72  Continue metoprolol succinate 25 mg daily and amlodipine 5 mg daily.         Digestive   GERD (gastroesophageal reflux disease) - Primary    Active and affecting quality of life.  Needs GI evaluation and upper endoscopy for further evaluation.  Continue pantoprazole 40 twice a day in the meantime.      Relevant Orders   Ambulatory referral to Gastroenterology     Genitourinary   BPH with obstruction/lower urinary tract symptoms    Stable.  On Flomax and Proscar.        Other   Hx of CABG   History of coronary artery disease  Other Visit Diagnoses     Encounter to establish care          Patient Instructions  Gastroesophageal Reflux Disease, Adult Gastroesophageal reflux (GER) happens when acid from the stomach flows up into the tube that connects the  mouth and the stomach (esophagus). Normally, food travels down the esophagus and stays in the stomach to be digested. With GER, food and stomach acid sometimes move back up into the esophagus. You may have a disease called gastroesophageal reflux disease (GERD) if the reflux: Happens often. Causes frequent or very bad symptoms. Causes problems such as damage to the esophagus. When this happens, the esophagus becomes sore and swollen. Over time, GERD can make small holes (ulcers) in the lining of the esophagus. What are the causes? This condition is caused by a problem with the muscle between the esophagus and the stomach. When this muscle is weak or not normal, it does not close properly to keep food and acid from coming back up from the stomach. The muscle can be weak because of: Tobacco use. Pregnancy. Having a certain type of hernia (hiatal hernia). Alcohol use. Certain foods and drinks, such as coffee, chocolate, onions, and peppermint. What increases the risk? Being overweight. Having a disease that affects your connective tissue. Taking NSAIDs, such a ibuprofen. What are the signs or symptoms? Heartburn. Difficult or painful swallowing. The feeling of having a lump in the throat. A bitter taste in the mouth. Bad breath. Having a lot of saliva. Having an upset or bloated stomach. Burping. Chest pain. Different conditions can cause chest pain. Make sure you see your doctor if you have chest pain. Shortness of breath or wheezing. A long-term cough or a cough at night. Wearing away of the surface of teeth (tooth enamel). Weight loss. How is this treated? Making changes to your diet. Taking medicine. Having surgery. Treatment will depend on how bad your symptoms are. Follow these instructions at home: Eating and drinking  Follow a diet as told by your doctor. You may need to avoid foods and drinks such as: Coffee and tea, with or without caffeine. Drinks that contain  alcohol. Energy drinks and sports drinks. Bubbly (carbonated) drinks or sodas. Chocolate and cocoa. Peppermint and mint flavorings. Garlic and onions. Horseradish. Spicy and acidic foods. These include peppers, chili powder, curry powder, vinegar, hot sauces, and BBQ sauce. Citrus fruit juices and citrus fruits, such as oranges, lemons, and limes. Tomato-based foods. These include red sauce, chili, salsa, and pizza with red sauce. Fried and fatty foods. These include donuts, french fries, potato chips, and high-fat dressings. High-fat meats. These include hot dogs, rib eye steak, sausage, ham, and bacon. High-fat dairy items, such as whole milk, butter, and cream cheese. Eat small meals often. Avoid eating large meals. Avoid drinking large amounts of liquid with your meals. Avoid eating meals during the 2-3 hours before bedtime. Avoid lying down right after you eat. Do not exercise right after you eat. Lifestyle  Do not smoke or use any products that contain nicotine or tobacco. If you need help quitting, ask your doctor. Try to lower your stress. If you need help doing this, ask your doctor. If you are overweight, lose an amount of weight that is healthy for you. Ask your doctor about a safe weight loss goal. General instructions Pay attention to any changes in your symptoms. Take over-the-counter and prescription medicines only as told by your doctor. Do not take aspirin, ibuprofen, or other NSAIDs unless your doctor  says it is okay. Wear loose clothes. Do not wear anything tight around your waist. Raise (elevate) the head of your bed about 6 inches (15 cm). You may need to use a wedge to do this. Avoid bending over if this makes your symptoms worse. Keep all follow-up visits. Contact a doctor if: You have new symptoms. You lose weight and you do not know why. You have trouble swallowing or it hurts to swallow. You have wheezing or a cough that keeps happening. You have a hoarse  voice. Your symptoms do not get better with treatment. Get help right away if: You have sudden pain in your arms, neck, jaw, teeth, or back. You suddenly feel sweaty, dizzy, or light-headed. You have chest pain or shortness of breath. You vomit and the vomit is green, yellow, or black, or it looks like blood or coffee grounds. You faint. Your poop (stool) is red, bloody, or black. You cannot swallow, drink, or eat. These symptoms may represent a serious problem that is an emergency. Do not wait to see if the symptoms will go away. Get medical help right away. Call your local emergency services (911 in the [place] Do not drive yourself to the hospital. Summary If a person has gastroesophageal reflux disease (GERD), food and stomach acid move back up into the esophagus and cause symptoms or problems such as damage to the esophagus. Treatment will depend on how bad your symptoms are. Follow a diet as told by your doctor. Take all medicines only as told by your doctor. This information is not intended to replace advice given to you by your health care provider. Make sure you discuss any questions you have with your health care provider. Document Revised: 02/22/2020 Document Reviewed: 02/22/2020 Elsevier Patient Education  2022 [place], MD [facility]

## 2021-10-03 NOTE — Assessment & Plan Note (Signed)
Active and affecting quality of life.  Needs GI evaluation and upper endoscopy for further evaluation.  Continue pantoprazole 40 twice a day in the meantime.

## 2021-10-03 NOTE — Assessment & Plan Note (Signed)
Well-controlled hypertension. BP Readings from Last 3 Encounters:  10/03/21 102/62  09/08/21 120/81  06/26/21 103/72  Continue metoprolol succinate 25 mg daily and amlodipine 5 mg daily.

## 2021-10-03 NOTE — Patient Instructions (Signed)
Gastroesophageal Reflux Disease, Adult Gastroesophageal reflux (GER) happens when acid from the stomach flows up into the tube that connects the mouth and the stomach (esophagus). Normally, food travels down the esophagus and stays in the stomach to be digested. With GER, food and stomach acid sometimes move back up into the esophagus. You may have a disease called gastroesophageal reflux disease (GERD) if the reflux: Happens often. Causes frequent or very bad symptoms. Causes problems such as damage to the esophagus. When this happens, the esophagus becomes sore and swollen. Over time, GERD can make small holes (ulcers) in the lining of the esophagus. What are the causes? This condition is caused by a problem with the muscle between the esophagus and the stomach. When this muscle is weak or not normal, it does not close properly to keep food and acid from coming back up from the stomach. The muscle can be weak because of: Tobacco use. Pregnancy. Having a certain type of hernia (hiatal hernia). Alcohol use. Certain foods and drinks, such as coffee, chocolate, onions, and peppermint. What increases the risk? Being overweight. Having a disease that affects your connective tissue. Taking NSAIDs, such a ibuprofen. What are the signs or symptoms? Heartburn. Difficult or painful swallowing. The feeling of having a lump in the throat. A bitter taste in the mouth. Bad breath. Having a lot of saliva. Having an upset or bloated stomach. Burping. Chest pain. Different conditions can cause chest pain. Make sure you see your doctor if you have chest pain. Shortness of breath or wheezing. A long-term cough or a cough at night. Wearing away of the surface of teeth (tooth enamel). Weight loss. How is this treated? Making changes to your diet. Taking medicine. Having surgery. Treatment will depend on how bad your symptoms are. Follow these instructions at home: Eating and drinking  Follow a  diet as told by your doctor. You may need to avoid foods and drinks such as: Coffee and tea, with or without caffeine. Drinks that contain alcohol. Energy drinks and sports drinks. Bubbly (carbonated) drinks or sodas. Chocolate and cocoa. Peppermint and mint flavorings. Garlic and onions. Horseradish. Spicy and acidic foods. These include peppers, chili powder, curry powder, vinegar, hot sauces, and BBQ sauce. Citrus fruit juices and citrus fruits, such as oranges, lemons, and limes. Tomato-based foods. These include red sauce, chili, salsa, and pizza with red sauce. Fried and fatty foods. These include donuts, french fries, potato chips, and high-fat dressings. High-fat meats. These include hot dogs, rib eye steak, sausage, ham, and bacon. High-fat dairy items, such as whole milk, butter, and cream cheese. Eat small meals often. Avoid eating large meals. Avoid drinking large amounts of liquid with your meals. Avoid eating meals during the 2-3 hours before bedtime. Avoid lying down right after you eat. Do not exercise right after you eat. Lifestyle  Do not smoke or use any products that contain nicotine or tobacco. If you need help quitting, ask your doctor. Try to lower your stress. If you need help doing this, ask your doctor. If you are overweight, lose an amount of weight that is healthy for you. Ask your doctor about a safe weight loss goal. General instructions Pay attention to any changes in your symptoms. Take over-the-counter and prescription medicines only as told by your doctor. Do not take aspirin, ibuprofen, or other NSAIDs unless your doctor says it is okay. Wear loose clothes. Do not wear anything tight around your waist. Raise (elevate) the head of your bed about 6  inches (15 cm). You may need to use a wedge to do this. Avoid bending over if this makes your symptoms worse. Keep all follow-up visits. Contact a doctor if: You have new symptoms. You lose weight and you  do not know why. You have trouble swallowing or it hurts to swallow. You have wheezing or a cough that keeps happening. You have a hoarse voice. Your symptoms do not get better with treatment. Get help right away if: You have sudden pain in your arms, neck, jaw, teeth, or back. You suddenly feel sweaty, dizzy, or light-headed. You have chest pain or shortness of breath. You vomit and the vomit is green, yellow, or black, or it looks like blood or coffee grounds. You faint. Your poop (stool) is red, bloody, or black. You cannot swallow, drink, or eat. These symptoms may represent a serious problem that is an emergency. Do not wait to see if the symptoms will go away. Get medical help right away. Call your local emergency services (911 in the [place] Do not drive yourself to the hospital. Summary If a person has gastroesophageal reflux disease (GERD), food and stomach acid move back up into the esophagus and cause symptoms or problems such as damage to the esophagus. Treatment will depend on how bad your symptoms are. Follow a diet as told by your doctor. Take all medicines only as told by your doctor. This information is not intended to replace advice given to you by your health care provider. Make sure you discuss any questions you have with your health care provider. Document Revised: 02/22/2020 Document Reviewed: 02/22/2020 Elsevier Patient Education  [place].

## 2021-10-03 NOTE — Assessment & Plan Note (Signed)
Stable.  On Flomax and Proscar.

## 2021-10-04 ENCOUNTER — Telehealth: Payer: Self-pay | Admitting: Gastroenterology

## 2021-10-04 NOTE — Telephone Encounter (Signed)
Hey Dr. Ardis Hughs,   We received a referral in our office for GERD. Patient was a previous patient of yours before transferring to [facility] due to insurance. He have not seen them since 2021 and would like to transfer care back to you as he switched insurances again. Could you please review and advise on scheduling?  Thank you

## 2021-10-09 ENCOUNTER — Encounter: Payer: Self-pay | Admitting: Physician Assistant

## 2021-10-09 NOTE — Telephone Encounter (Signed)
Scheduled OV 2/27 with APP

## 2021-10-20 NOTE — Progress Notes (Signed)
10/23/2021 Eugene Mccullough [id number] 27-Jul-1961   ASSESSMENT AND PLAN:   Gastroesophageal reflux disease with esophagitis without hemorrhage History of h pylori x 2 recently treated 05/2021 by PCP, will need bx for eradication -     famotidine (PEPCID) 40 MG tablet; Take 1 tablet (40 mg total) by mouth at bedtime. -     sucralfate (CARAFATE) 1 g tablet; Take 1 tablet (1 g total) by mouth 4 (four) times daily -  with meals and at bedtime for 10 days. -     CBC with Differential/Platelet; Future -     Comprehensive metabolic panel; Future - add on H2 to BID PPI, carafate short dose- schedule EGD to evaluate- has had normal esophageal manometry, abnormal pH study, 4 cm hernia No symptoms of reflux, not candidate for TIFS due to [facility], may still benefit from surgical referral for eval if medications are not helpful or if any complications from GERD seen on EGD  - Will schedule EGD to evaluate for possible H. pylori, eosinophilic esophagitis, peptic ulcer disease, or strictures. I discussed risks of EGD with patient today, including risk of sedation, bleeding or perforation.  Patient provides understanding and gave verbal consent to proceed.  PUD (peptic ulcer disease) Will start the patient on a PPI BID WITH H2 will schedule for EGD Lifestyle changes discussed, avoid NSAIDS, ETOH Will check CBC  Coronary artery disease involving native coronary artery of native heart with angina pectoris ([place]) status post bypass 12/2020 on plavix follows with Dr. Terri Skains Patient told to hold his Plavix for 5 days prior to time of procedure.  We will communicate with his prescribing physician Dr. Terri Skains to ensure that holding his Plavix is acceptable for him.  Alternatives to the procedure were discussed as well as risks and benefits of procedure including bleeding, perforation, infection, missed lesions, medication reactions and possible hospitalization or surgery if complications occur explained. Additional  rare but real risk of cardiovascular event such as heart attack or ischemia/infarct of other organs off Plavix explained and need to seek urgent help if this occurs.  He is agreeable and wishes to proceed.   Hypercalcemia Off tums, will recheck cbc/CMET  Patient Care Team: Horald Pollen, MD as PCP - General (Internal Medicine)  HISTORY OF PRESENT ILLNESS: 61 y.o. male referred by [name], [name], MD, with a past medical history of CAD status post bypass 12/2020 on plavix follows with Dr. Terri Skains, GERD, IBS, peptic ulcer disease, status post cholecystectomy and others listed below presents for evaluation of GERD.  Patient known to Dr. Ardis Hughs, last seen in the office 01/2018.   06/13/2018 colonoscopy with diminutive 1 mm polyp, multiple diverticula. 04/02/2018 endoscopy for IDA showed completely normal esophagus, stomach, small intestines. [facility] 2020 globulus sensation,, peptic ulcer disease/GERD.  09/16/19 EGD at [facility] medium size hiatal hernia.  EoE negative. Empiric esophageal dilation with 17 mm Savary dilator was performed.  Esophageal manometry was normal with a 4 cm hiatal hernia noted.  Esophageal pH study on medications was abnormal, indicating an abnormal degree of distal esophageal acid reflux with a DeMeester score of 61 without associated symptoms. Did express interest in surgical therapy for his reflux, but declined following statistics of 50% of patients post nissan fundoplication back on PPIs at 5 years.  Patient back on pantoprazole 40 mg twice daily, given trial of baclofen 10 mg before meals.  He has hoarseness  with his voice.  Occ has slight pain when he swallows, has globulus  sensation, no dysphagia.  He has ulcers in his mouth, not at this time. Will have taste on his tongue.  Has some seasonal allergies as well.  Denies early satiety, no AB bloating.  He has had 30 lbs with diet, but has been craving sugary food/yogurt/milk/coffee.  When he  is on PPI BID it is 90% better.  He will stop eating at 6 or 7 pm, will go to bed at 10 or 11, he will wake up with in the middle of night with reflux if he eats a heavy meal at 7 or later.  Was treated for h pylori in 2015 per records and treated again 05/2021 at [facility], no eradication study.  No anemia, normal kidney/liver per PCP notes.   Does take NSAIDS occ for pain, no ETOH.   Current Medications:    Current Outpatient Medications (Cardiovascular):    amLODipine (NORVASC) 5 MG tablet, Take 1 tablet (5 mg total) by mouth daily.   atorvastatin (LIPITOR) 40 MG tablet, Take 1 tablet (40 mg total) by mouth at bedtime.   metoprolol succinate (TOPROL XL) 25 MG 24 hr tablet, Take 1 tablet (25 mg total) by mouth every morning.   nitroGLYCERIN (NITROSTAT) 0.4 MG SL tablet, PLACE 1 TABLET (0.4 MG TOTAL) UNDER THE TONGUE EVERY 5 (FIVE) MINUTES AS NEEDED FOR CHEST PAIN. IF YOU REQUIRE MORE THAN TWO TABLETS FIVE MINUTES APART GO TO THE NEAREST ER VIA EMS.   Current Outpatient Medications (Analgesics):    aspirin EC 81 MG tablet, Take 1 tablet (81 mg total) by mouth daily. Swallow whole.  Current Outpatient Medications (Hematological):    clopidogrel (PLAVIX) 75 MG tablet, Take 1 tablet (75 mg total) by mouth daily.  Current Outpatient Medications (Other):    famotidine (PEPCID) 40 MG tablet, Take 1 tablet (40 mg total) by mouth at bedtime.   finasteride (PROSCAR) 5 MG tablet, TAKE 1 TABLET BY MOUTH EVERY DAY   gabapentin (NEURONTIN) 100 MG capsule, Take 100 mg by mouth 2 (two) times daily.   pantoprazole (PROTONIX) 40 MG tablet, Take 40 mg by mouth 2 (two) times daily.   sucralfate (CARAFATE) 1 g tablet, Take 1 tablet (1 g total) by mouth 4 (four) times daily -  with meals and at bedtime for 10 days.   tamsulosin (FLOMAX) 0.4 MG CAPS capsule, Take 0.4 mg by mouth daily.  Medical History:  Past Medical History:  Diagnosis Date   Anemia    Anginal pain (HCC)    Arthritis     BPH (benign prostatic hyperplasia)    Coronary artery disease    Eczema    Gastric ulcer    GERD (gastroesophageal reflux disease)    Gout    H. pylori infection    Hyperlipidemia    Hypertension    IBS (irritable bowel syndrome)    Pre-diabetes    Sleep apnea    Does not use CPAP   Allergies:  Allergies  Allergen Reactions   Hydrocodone-Acetaminophen Itching   Oxycodone Itching     Surgical History:  He  has a past surgical history that includes Cholecystectomy (2005); Colonoscopy (08/2010); Upper gi endoscopy (2019); Wisdom tooth extraction; LEFT HEART CATH AND CORONARY ANGIOGRAPHY (N/A, 12/21/2020); Cardiac catheterization; Coronary artery bypass graft (N/A, 01/10/2021); Radial artery harvest (Left, 01/10/2021); TEE without cardioversion (N/A, 01/10/2021); and Exploration post operative open heart (N/A, 01/11/2021). Family History:  His family history includes Diabetes in his brother and sister; Eating disorder in his daughter; Eczema in his son;  Hyperlipidemia in his mother; Hypertension in his sister; Irritable bowel syndrome in his son; Obesity in his sister. Social History:   reports that he quit smoking about 9 years ago. His smoking use included cigarettes. He has a 20.00 pack-year smoking history. He has never used smokeless tobacco. He reports that he does not drink alcohol and does not use drugs.  REVIEW OF SYSTEMS  : All other systems reviewed and negative except where noted in the History of Present Illness.   PHYSICAL EXAM: BP 100/60    Pulse 91    Ht 5\' 7"  (1.702 m)    Wt 186 lb (84.4 kg)    BMI 29.13 kg/m  General:   Pleasant, well developed male in no acute distress Head:  Normocephalic and atraumatic. Eyes: sclerae anicteric,conjunctive pink  Heart:  regular rate and rhythm Pulm: Clear anteriorly; no wheezing Abdomen:  Soft, Obese AB, skin exam normal, Normal bowel sounds.  no  tenderness . Without guarding and Without rebound, without hepatomegaly. Extremities:   Without edema. Msk:  Symmetrical without gross deformities. Peripheral pulses intact.  Neurologic:  Alert and  oriented x4;  grossly normal neurologically. Skin:   Dry and intact without significant lesions or rashes. Psychiatric: Demonstrates good judgement and reason without abnormal affect or behaviors.   Vladimir Crofts, PA-C 3:01 PM

## 2021-10-23 ENCOUNTER — Encounter: Payer: Self-pay | Admitting: Physician Assistant

## 2021-10-23 ENCOUNTER — Other Ambulatory Visit (INDEPENDENT_AMBULATORY_CARE_PROVIDER_SITE_OTHER): Payer: 59

## 2021-10-23 ENCOUNTER — Ambulatory Visit (INDEPENDENT_AMBULATORY_CARE_PROVIDER_SITE_OTHER): Payer: 59 | Admitting: Physician Assistant

## 2021-10-23 VITALS — BP 100/60 | HR 91 | Ht 67.0 in | Wt 186.0 lb

## 2021-10-23 DIAGNOSIS — I25119 Atherosclerotic heart disease of native coronary artery with unspecified angina pectoris: Secondary | ICD-10-CM | POA: Diagnosis not present

## 2021-10-23 DIAGNOSIS — K2100 Gastro-esophageal reflux disease with esophagitis, without bleeding: Secondary | ICD-10-CM

## 2021-10-23 DIAGNOSIS — K279 Peptic ulcer, site unspecified, unspecified as acute or chronic, without hemorrhage or perforation: Secondary | ICD-10-CM | POA: Diagnosis not present

## 2021-10-23 LAB — COMPREHENSIVE METABOLIC PANEL
ALT: 12 U/L (ref 0–53)
AST: 16 U/L (ref 0–37)
Albumin: 4.2 g/dL (ref 3.5–5.2)
Alkaline Phosphatase: 73 U/L (ref 39–117)
BUN: 16 mg/dL (ref 6–23)
CO2: 29 mEq/L (ref 19–32)
Calcium: 9.8 mg/dL (ref 8.4–10.5)
Chloride: 102 mEq/L (ref 96–112)
Creatinine, Ser: 1.13 mg/dL (ref 0.40–1.50)
GFR: 70.36 mL/min (ref >60.00)
Glucose, Bld: 106 mg/dL — ABNORMAL HIGH (ref 70–99)
Potassium: 4.5 mEq/L (ref 3.5–5.1)
Sodium: 135 mEq/L (ref 135–145)
Total Bilirubin: 0.5 mg/dL (ref 0.2–1.2)
Total Protein: 8.1 g/dL (ref 6.0–8.3)

## 2021-10-23 LAB — CBC WITH DIFFERENTIAL/PLATELET
Basophils Absolute: 0 10*3/uL (ref 0.0–0.1)
Basophils Relative: 1.1 % (ref 0.0–3.0)
Eosinophils Absolute: 0.2 10*3/uL (ref 0.0–0.7)
Eosinophils Relative: 4.7 % (ref 0.0–5.0)
HCT: 40 % (ref 39.0–52.0)
Hemoglobin: 13.3 g/dL (ref 13.0–17.0)
Lymphocytes Relative: 39.1 % (ref 12.0–46.0)
Lymphs Abs: 1.8 10*3/uL (ref 0.7–4.0)
MCHC: 33.2 g/dL (ref 30.0–36.0)
MCV: 79.7 fl (ref 78.0–100.0)
Monocytes Absolute: 0.5 10*3/uL (ref 0.1–1.0)
Monocytes Relative: 11.9 % (ref 3.0–12.0)
Neutro Abs: 1.9 10*3/uL (ref 1.4–7.7)
Neutrophils Relative %: 43.2 % (ref 43.0–77.0)
Platelets: 117 10*3/uL — ABNORMAL LOW (ref 150.0–400.0)
RBC: 5.02 Mil/uL (ref 4.22–5.81)
RDW: 15.4 % (ref 11.5–15.5)
WBC: 4.5 10*3/uL (ref 4.0–10.5)

## 2021-10-23 MED ORDER — SUCRALFATE 1 G PO TABS: 1.0000 g | ORAL_TABLET | Freq: Three times a day (TID) | ORAL | 0 refills | Status: DC

## 2021-10-23 MED ORDER — FAMOTIDINE 40 MG PO TABS: 40.0000 mg | ORAL_TABLET | Freq: Every day | ORAL | 2 refills | Status: DC

## 2021-10-23 NOTE — Patient Instructions (Addendum)
If you are age 61 or younger, your body mass index should be between 19-25. Your Body mass index is 29.13 kg/m. If this is out of the aformentioned range listed, please consider follow up with your Primary Care Provider.  ________________________________________________________  The  GI providers would like to encourage you to use [facility] to communicate with providers for non-urgent requests or questions.  Due to long hold times on the telephone, sending your provider a message by [facility] may be a faster and more efficient way to get a response.  Please allow 48 business hours for a response.  Please remember that this is for non-urgent requests.   Your provider has requested that you go to the basement level for lab work before leaving today. Press "B" on the elevator. The lab is located at the first door on the left as you exit the elevator.  Due to recent changes in healthcare laws, you may see the results of your imaging and laboratory studies on MyChart before your provider has had a chance to review them.  We understand that in some cases there may be results that are confusing or concerning to you. Not all laboratory results come back in the same time frame and the provider may be waiting for multiple results in order to interpret others.  Please give [place] 48 hours in order for your provider to thoroughly review all the results before contacting the office for clarification of your results.  _______________________________________________________  Continue protonix 40 mg twice daily 30 MINS TO 1 HOUR BEFORE FOOD TO MAKE IT MOST EFFECTIVE  CAN DO FAMOTIDINE AT NIGHT FOR 30-90 NIGHTS WITH THE PROTONIX  CAN DO CARAFATE 2-4 X A DAY BEFORE FOOD, CAN PUT IN LIQUIDS, STIR, LET SIT 20 MINS AND STIR AGAIN   We have sent the following medications to your pharmacy for you to pick up at your convenience: Famotidine, Carafate  You have been scheduled for an endoscopy. Please follow written  instructions given to you at your visit today. If you use inhalers (even only as needed), please bring them with you on the day of your procedure.  Avoid spicy and acidic foods Avoid fatty foods Limit your intake of coffee, tea, alcohol, and carbonated drinks Work to maintain a healthy weight Keep the head of the bed elevated at least 3 inches with blocks or a wedge pillow if you are having any nighttime symptoms Stay upright for 2 hours after eating Avoid meals and snacks three to four hours before bedtime  Gastroesophageal Reflux Disease, Adult Gastroesophageal reflux (GER) happens when acid from the stomach flows up into the tube that connects the mouth and the stomach (esophagus). Normally, food travels down the esophagus and stays in the stomach to be digested. However, when a person has GER, food and stomach acid sometimes move back up into the esophagus. If this becomes a more serious problem, the person may be diagnosed with a disease called gastroesophageal reflux disease (GERD). GERD occurs when the reflux: Happens often. Causes frequent or severe symptoms. Causes problems such as damage to the esophagus. When stomach acid comes in contact with the esophagus, the acid may cause inflammation in the esophagus. Over time, GERD may create small holes (ulcers) in the lining of the esophagus. What are the causes? This condition is caused by a problem with the muscle between the esophagus and the stomach (lower esophageal sphincter, or LES). Normally, the LES muscle closes after food passes through the esophagus to the stomach. When  the LES is weakened or abnormal, it does not close properly, and that allows food and stomach acid to go back up into the esophagus. The LES can be weakened by certain dietary substances, medicines, and medical conditions, including: Tobacco use. Pregnancy. Having a hiatal hernia. Alcohol use. Certain foods and beverages, such as coffee, chocolate, onions, and  peppermint. What increases the risk? You are more likely to develop this condition if you: Have an increased body weight. Have a connective tissue disorder. Take NSAIDs, such as ibuprofen. What are the signs or symptoms? Symptoms of this condition include: Heartburn. Difficult or painful swallowing and the feeling of having a lump in the throat. A bitter taste in the mouth. Bad breath and having a large amount of saliva. Having an upset or bloated stomach and belching. Chest pain. Different conditions can cause chest pain. Make sure you see your health care provider if you experience chest pain. Shortness of breath or wheezing. Ongoing (chronic) cough or a nighttime cough. Wearing away of tooth enamel. Weight loss. How is this diagnosed? This condition may be diagnosed based on a medical history and a physical exam. To determine if you have mild or severe GERD, your health care provider may also monitor how you respond to treatment. You may also have tests, including: A test to examine your stomach and esophagus with a small camera (endoscopy). A test that measures the acidity level in your esophagus. A test that measures how much pressure is on your esophagus. A barium swallow or modified barium swallow test to show the shape, size, and functioning of your esophagus. How is this treated? Treatment for this condition may vary depending on how severe your symptoms are. Your health care provider may recommend: Changes to your diet. Medicine. Surgery. The goal of treatment is to help relieve your symptoms and to prevent complications. Follow these instructions at home: Eating and drinking  Follow a diet as recommended by your health care provider. This may involve avoiding foods and drinks such as: Coffee and tea, with or without caffeine. Drinks that contain alcohol. Energy drinks and sports drinks. Carbonated drinks or sodas. Chocolate and cocoa. Peppermint and mint  flavorings. Garlic and onions. Horseradish. Spicy and acidic foods, including peppers, chili powder, curry powder, vinegar, hot sauces, and barbecue sauce. Citrus fruit juices and citrus fruits, such as oranges, lemons, and limes. Tomato-based foods, such as red sauce, chili, salsa, and pizza with red sauce. Fried and fatty foods, such as donuts, french fries, potato chips, and high-fat dressings. High-fat meats, such as hot dogs and fatty cuts of red and white meats, such as rib eye steak, sausage, ham, and bacon. High-fat dairy items, such as whole milk, butter, and cream cheese. Eat small, frequent meals instead of large meals. Avoid drinking large amounts of liquid with your meals. Avoid eating meals during the 2-3 hours before bedtime. Avoid lying down right after you eat. Do not exercise right after you eat. Lifestyle  Do not use any products that contain nicotine or tobacco. These products include cigarettes, chewing tobacco, and vaping devices, such as e-cigarettes. If you need help quitting, ask your health care provider. Try to reduce your stress by using methods such as yoga or meditation. If you need help reducing stress, ask your health care provider. If you are overweight, reduce your weight to an amount that is healthy for you. Ask your health care provider for guidance about a safe weight loss goal. General instructions Pay attention to any changes  in your symptoms. Take over-the-counter and prescription medicines only as told by your health care provider. Do not take aspirin, ibuprofen, or other NSAIDs unless your health care provider told you to take these medicines. Wear loose-fitting clothing. Do not wear anything tight around your waist that causes pressure on your abdomen. Raise (elevate) the head of your bed about 6 inches (15 cm). You can use a wedge to do this. Avoid bending over if this makes your symptoms worse. Keep all follow-up visits. This is  important. Contact a health care provider if: You have: New symptoms. Unexplained weight loss. Difficulty swallowing or it hurts to swallow. Wheezing or a persistent cough. A hoarse voice. Your symptoms do not improve with treatment. Get help right away if: You have sudden pain in your arms, neck, jaw, teeth, or back. You suddenly feel sweaty, dizzy, or light-headed. You have chest pain or shortness of breath. You vomit and the vomit is green, yellow, or black, or it looks like blood or coffee grounds. You faint. You have stool that is red, bloody, or black. You cannot swallow, drink, or eat. These symptoms may represent a serious problem that is an emergency. Do not wait to see if the symptoms will go away. Get medical help right away. Call your local emergency services (911 in the [place] Do not drive yourself to the hospital. Summary Gastroesophageal reflux happens when acid from the stomach flows up into the esophagus. GERD is a disease in which the reflux happens often, causes frequent or severe symptoms, or causes problems such as damage to the esophagus. Treatment for this condition may vary depending on how severe your symptoms are. Your health care provider may recommend diet and lifestyle changes, medicine, or surgery. Contact a health care provider if you have new or worsening symptoms. Take over-the-counter and prescription medicines only as told by your health care provider. Do not take aspirin, ibuprofen, or other NSAIDs unless your health care provider told you to do so. Keep all follow-up visits as told by your health care provider. This is important. This information is not intended to replace advice given to you by your health care provider. Make sure you discuss any questions you have with your health care provider. Document Revised: 02/22/2020 Document Reviewed: 02/22/2020 Elsevier Patient Education  2022 [place] you for entrusting me with your care and  choosing [facility].  Vicie Mutters, PA-C

## 2021-10-24 ENCOUNTER — Other Ambulatory Visit: Payer: Self-pay | Admitting: Physician Assistant

## 2021-10-24 DIAGNOSIS — K2100 Gastro-esophageal reflux disease with esophagitis, without bleeding: Secondary | ICD-10-CM

## 2021-10-24 NOTE — Progress Notes (Signed)
I agree with the above note, plan 

## 2021-10-31 ENCOUNTER — Encounter: Payer: Self-pay | Admitting: Gastroenterology

## 2021-10-31 ENCOUNTER — Telehealth: Payer: Self-pay

## 2021-10-31 NOTE — Telephone Encounter (Signed)
Pre-operative Risk Assessment  ?   ?Request for surgical clearance:     Endoscopy Procedure ? ?What type of surgery is being performed?     EGD ? ?When is this surgery scheduled?     11-10-21 ? ?What type of clearance is required ?   Pharmacy ? ?Are there any medications that need to be held prior to surgery and how long? Plavix x 5 days ? ?Practice name and name of physician performing surgery?      [facility] ? ?What is your office phone and fax number?      Phone- [phone]  Fax- [phone] ? ?Anesthesia type (None, local, MAC, general) ?       [facility] ? ?

## 2021-11-03 NOTE — Telephone Encounter (Signed)
Sent to your office.  ? ?Rex Kras, DO, FACC ?Pager: [phone] ?Office: [phone] ?

## 2021-11-03 NOTE — Telephone Encounter (Signed)
Patient advised that he has been given clearance to hold Plavix 5 days prior to EGD scheduled for 11-10-21.  Patient advised to take last dose of Plavix on 11-04-21, and he will be advised when to restart Plavix by Dr Ardis Hughs after the procedure.  Patient agreed to plan and verbalized understanding.  No further questions.  ? ?

## 2021-11-09 ENCOUNTER — Other Ambulatory Visit: Payer: Self-pay | Admitting: Cardiology

## 2021-11-10 ENCOUNTER — Ambulatory Visit (AMBULATORY_SURGERY_CENTER): Payer: 59 | Admitting: Gastroenterology

## 2021-11-10 ENCOUNTER — Encounter: Payer: Self-pay | Admitting: Gastroenterology

## 2021-11-10 VITALS — BP 109/75 | HR 78 | Temp 98.6°F | Resp 15 | Ht 67.0 in | Wt 186.0 lb

## 2021-11-10 DIAGNOSIS — K449 Diaphragmatic hernia without obstruction or gangrene: Secondary | ICD-10-CM

## 2021-11-10 DIAGNOSIS — K2100 Gastro-esophageal reflux disease with esophagitis, without bleeding: Secondary | ICD-10-CM

## 2021-11-10 DIAGNOSIS — R12 Heartburn: Secondary | ICD-10-CM

## 2021-11-10 MED ORDER — SODIUM CHLORIDE 0.9 % IV SOLN: 500.0000 mL | Freq: Once | INTRAVENOUS | Status: AC

## 2021-11-10 NOTE — Op Note (Addendum)
[facility] ?Patient Name: Eugene Mccullough ?Procedure Date: 11/10/2021 9:40 AM ?MRN: [id number] ?Endoscopist: Milus Banister , MD ?Age: 61 ?Referring MD:  ?Date of Birth: 10-16-1960 ?Gender: Male ?Account #: [id number] ?Procedure:                Upper GI endoscopy ?Indications:              Heartburn; 04/02/2018 endoscopy Dr. Ardis Hughs for IDA  ?                          showed completely normal esophagus, stomach, small  ?                          intestines.[facility] 2020  ?                          globulus sensation,, peptic ulcer disease/GERD.  ?                          09/16/19 EGD at [facility] medium size hiatal hernia. EoE  ?                          negative. Empiric esophageal dilation with 17 mm  ?                          Savary dilator was performed. [facility]  ?                          manometry was normal with a 4 cm hiatal hernia  ?                          noted. [place] [facility] pH study on medications was  ?                          abnormal, indicating an abnormal degree of distal  ?                          esophageal acid reflux with a DeMeester score of 61  ?                          without associated symptoms. ?Medicines:                Monitored Anesthesia Care ?Procedure:                Pre-Anesthesia Assessment: ?                          - Prior to the procedure, a History and Physical  ?                          was performed, and patient medications and  ?                          allergies were reviewed. The patient's tolerance of  ?  previous anesthesia was also reviewed. The risks  ?                          and benefits of the procedure and the sedation  ?                          options and risks were discussed with the patient.  ?                          All questions were answered, and informed consent  ?                          was obtained. Prior Anticoagulants: The patient has  ?                          taken Plavix  (clopidogrel), last dose was 5 days  ?                          prior to procedure. ASA Grade Assessment: II - A  ?                          patient with mild systemic disease. After reviewing  ?                          the risks and benefits, the patient was deemed in  ?                          satisfactory condition to undergo the procedure. ?                          After obtaining informed consent, the endoscope was  ?                          passed under direct vision. Throughout the  ?                          procedure, the patient's blood pressure, pulse, and  ?                          oxygen saturations were monitored continuously. The  ?                          GIF [id number] #[id number] was introduced through the  ?                          mouth, and advanced to the second part of duodenum.  ?                          The upper GI endoscopy was accomplished without  ?                          difficulty. The patient tolerated the procedure  ?  well. ?Scope In: ?Scope Out: ?Findings:                 A 6 cm hiatal hernia was present. ?                          The exam was otherwise without abnormality. ?Complications:            No immediate complications. Estimated blood loss:  ?                          None. ?Estimated Blood Loss:     Estimated blood loss: none. ?Impression:               - 6 cm hiatal hernia. ?                          - The examination was otherwise normal. ?                          - No specimens collected. ?Recommendation:           - Patient has a contact number available for  ?                          emergencies. The signs and symptoms of potential  ?                          delayed complications were discussed with the  ?                          patient. Return to normal activities tomorrow.  ?                          Written discharge instructions were provided to the  ?                          patient. ?                          - Resume previous  diet. ?                          - Continue present medications. Continue  ?                          pantoprazole '40mg'$  pills, one pill shortly before  ?                          breakfast and dinner meals. Continue pepcid '20mg'$   ?                          pill, one pill at bedtime every night. ?                          - You can resume your plavix today. ?                          -  Dr. Ardis Hughs' office will arrange referral to  ?                          [facility] to consider hiatal hernia repair ?  ?                          fundoplication as he is not happy with the prospect  ?                          of stayin on medicines much longer. ?Milus Banister, MD ?11/10/2021 9:53:52 AM ?This report has been signed electronically. ?

## 2021-11-10 NOTE — Progress Notes (Signed)
Pt's states no medical or surgical changes since previsit or office visit. 

## 2021-11-10 NOTE — Patient Instructions (Signed)
Resume previous diet and continue present medications - continue pantoprazole 40 mg (1 pill before breakfast and dinner meals), continue pepcid 20 mg (1 at bedtime every night), you can resume your plavix today ?Dr. Eugenia Pancoast office will arrange referral to [facility] to consider hiatal hernia repair? Fundoplication ? ?YOU HAD AN ENDOSCOPIC PROCEDURE TODAY AT [facility]:   Refer to the procedure report that was given to you for any specific questions about what was found during the examination.  If the procedure report does not answer your questions, please call your gastroenterologist to clarify.  If you requested that your care partner not be given the details of your procedure findings, then the procedure report has been included in a sealed envelope for you to review at your convenience later. ? ?YOU SHOULD EXPECT: Some feelings of bloating in the abdomen. Passage of more gas than usual.  Walking can help get rid of the air that was put into your GI tract during the procedure and reduce the bloating. If you had a lower endoscopy (such as a colonoscopy or flexible sigmoidoscopy) you may notice spotting of blood in your stool or on the toilet paper. If you underwent a bowel prep for your procedure, you may not have a normal bowel movement for a few days. ? ?Please Note:  You might notice some irritation and congestion in your nose or some drainage.  This is from the oxygen used during your procedure.  There is no need for concern and it should clear up in a day or so. ? ?SYMPTOMS TO REPORT IMMEDIATELY: ? ?Following upper endoscopy (EGD) ? Vomiting of blood or coffee ground material ? New chest pain or pain under the shoulder blades ? Painful or persistently difficult swallowing ? New shortness of breath ? Fever of 100?F or higher ? Black, tarry-looking stools ? ?For urgent or emergent issues, a gastroenterologist can be reached at any hour by calling [phone]. ?Do not use MyChart messaging for  urgent concerns.  ? ? ?DIET:  We do recommend a small meal at first, but then you may proceed to your regular diet.  Drink plenty of fluids but you should avoid alcoholic beverages for 24 hours. ? ?ACTIVITY:  You should plan to take it easy for the rest of today and you should NOT DRIVE or use heavy machinery until tomorrow (because of the sedation medicines used during the test).   ? ?FOLLOW UP: ?Our staff will call the number listed on your records 48-72 hours following your procedure to check on you and address any questions or concerns that you may have regarding the information given to you following your procedure. If we do not reach you, we will leave a message.  We will attempt to reach you two times.  During this call, we will ask if you have developed any symptoms of COVID 19. If you develop any symptoms (ie: fever, flu-like symptoms, shortness of breath, cough etc.) before then, please call [phone].  If you test positive for Covid 19 in the 2 weeks post procedure, please call and report this information to [place].   ? ?If any biopsies were taken you will be contacted by phone or by letter within the next 1-3 weeks.  Please call us at [phone] if you have not heard about the biopsies in 3 weeks.  ? ? ?SIGNATURES/CONFIDENTIALITY: ?You and/or your care partner have signed paperwork which will be entered into your electronic medical record.  These signatures attest to  the fact that that the information above on your After Visit Summary has been reviewed and is understood.  Full responsibility of the confidentiality of this discharge information lies with you and/or your care-partner.  ?

## 2021-11-10 NOTE — Progress Notes (Signed)
Report to PACU, RN, vss, BBS= Clear.  

## 2021-11-10 NOTE — Progress Notes (Signed)
?  The recent H&P (dated 10/23/2021) was reviewed, the patient was examined and there is no change in the patients condition since that H&P was completed. ? ? ?Eugene Mccullough  11/10/2021, 9:38 AM ? ? ?

## 2021-11-14 ENCOUNTER — Telehealth: Payer: Self-pay

## 2021-11-14 ENCOUNTER — Telehealth: Payer: Self-pay | Admitting: *Deleted

## 2021-11-14 NOTE — Telephone Encounter (Signed)
First attempt, left VM.  

## 2021-11-14 NOTE — Telephone Encounter (Signed)
Second attempt follow up call to pt, no answer.  

## 2021-11-26 ENCOUNTER — Ambulatory Visit
Admission: EM | Admit: 2021-11-26 | Discharge: 2021-11-26 | Disposition: A | Discharge status: Home / Self Care | Payer: 59 | Attending: Physician Assistant | Admitting: Physician Assistant

## 2021-11-26 ENCOUNTER — Other Ambulatory Visit: Payer: Self-pay

## 2021-11-26 ENCOUNTER — Encounter: Payer: Self-pay | Admitting: Emergency Medicine

## 2021-11-26 DIAGNOSIS — R1013 Epigastric pain: Secondary | ICD-10-CM | POA: Diagnosis not present

## 2021-11-26 NOTE — ED Provider Notes (Signed)
?[facility] ? ? ? ?CSN: [id number] ?Arrival date & time: 11/26/21  0017 ? ? ?  ? ?History   ?Chief Complaint ?No chief complaint on file. ? ? ?HPI ?Eugene Mccullough is a 61 y.o. male.  ? ?Patient here today for evaluation of epigastric pain with associated nausea that has been ongoing for the last few days. He is currently fasting during the day in observance of Ramadan, and reports no discomfort during the day but after he has eaten the last few days he will have severe epigastric pain that is sharp and he will vomit. He denies any blood in his vomit or stool. He does note some diarrhea as well. He has history of GERD, notes that he had endoscopy one month ago that was clear for any gastric ulcers, etc. He is currently taking multiple meds for GERD, he is more concerned that his symptoms are related to something he ate. He has not had fever. He denies any other symptoms.  ? ?The history is provided by the patient.  ? ?Past Medical History:  ?Diagnosis Date  ? Anemia   ? Anginal pain ([place])   ? Arthritis   ? BPH (benign prostatic hyperplasia)   ? Coronary artery disease   ? Eczema   ? Gastric ulcer   ? GERD (gastroesophageal reflux disease)   ? Gout   ? H. pylori infection   ? Hyperlipidemia   ? Hypertension   ? IBS (irritable bowel syndrome)   ? Pre-diabetes   ? Sleep apnea   ? Does not use CPAP  ? ? ?Patient Active Problem List  ? Diagnosis Date Noted  ? History of coronary artery disease 10/03/2021  ? Hx of CABG 01/10/2021  ? Essential hypertension 01/06/2021  ? Mixed hyperlipidemia 01/06/2021  ? Anemia   ? BPH (benign prostatic hyperplasia)   ? Eczema   ? GERD (gastroesophageal reflux disease)   ? Hyperlipidemia   ? IBS (irritable bowel syndrome)   ? Sleep apnea   ? CAD (coronary artery disease) 12/20/2020  ? Precordial pain   ? DDD (degenerative disc disease), lumbar 08/15/2018  ? Pes cavus 08/15/2018  ? History of IBS 08/15/2018  ? Lesion of oral mucosa 08/14/2018  ? Generalized abdominal tenderness  without rebound tenderness 08/14/2018  ? Borderline results on serologic testing for Lyme disease 07/05/2018  ? Arthralgia 07/02/2018  ? Lumbar radiculopathy 09/12/2017  ? PUD (peptic ulcer disease) 10/28/2014  ? Allergic rhinitis, cause unspecified 01/15/2014  ? Dermatitis, atopic 05/05/2013  ? Obstructive sleep apnea 02/02/2013  ? Routine general medical examination at a health care facility 01/02/2013  ? BPH with obstruction/lower urinary tract symptoms 01/02/2013  ? Hypertension 08/15/2011  ? Obesity (BMI 30-39.9)   ? ? ?Past Surgical History:  ?Procedure Laterality Date  ? CARDIAC CATHETERIZATION    ? 2022  ? CHOLECYSTECTOMY  2005  ? COLONOSCOPY  08/2010  ? CORONARY ARTERY BYPASS GRAFT N/A 01/10/2021  ? Procedure: CORONARY ARTERY BYPASS GRAFTING (CABG) X  FOUR ON PUMP USING LEFT INTERNAL MAMMARY ARTERY, LEFT RADIAL ARTERY, RIGHT ENDOSCOPIC GREATER SAPHENOUS VEIN HARVEST CONDUITS;  Surgeon: Lajuana Matte, MD;  Location: [facility];  Service: Open Heart Surgery;  Laterality: N/A;  ? EXPLORATION POST OPERATIVE OPEN HEART N/A 01/11/2021  ? Procedure: , MEDIASTINAL WASHOUT;  Surgeon: Lajuana Matte, MD;  Location: [facility];  Service: Open Heart Surgery;  Laterality: N/A;  ? LEFT HEART CATH AND CORONARY ANGIOGRAPHY N/A 12/21/2020  ? Procedure: LEFT HEART CATH  AND CORONARY ANGIOGRAPHY;  Surgeon: Nigel Mormon, MD;  Location: [facility];  Service: Cardiovascular;  Laterality: N/A;  ? RADIAL ARTERY HARVEST Left 01/10/2021  ? Procedure: RADIAL ARTERY HARVEST;  Surgeon: Lajuana Matte, MD;  Location: [facility];  Service: Open Heart Surgery;  Laterality: Left;  ? TEE WITHOUT CARDIOVERSION N/A 01/10/2021  ? Procedure: TRANSESOPHAGEAL ECHOCARDIOGRAM (TEE);  Surgeon: Lajuana Matte, MD;  Location: [facility];  Service: Open Heart Surgery;  Laterality: N/A;  ? UPPER GI ENDOSCOPY  2019  ? WISDOM TOOTH EXTRACTION    ? ? ? ? ? ?Home Medications   ? ?Prior to Admission medications   ?Medication Sig Start Date End  Date Taking? Authorizing Provider  ?amLODipine (NORVASC) 5 MG tablet TAKE 1 TABLET (5 MG TOTAL) BY MOUTH DAILY. 11/09/21   Tolia, Sunit, DO  ?aspirin EC 81 MG tablet Take 1 tablet (81 mg total) by mouth daily. Swallow whole. 02/15/21   Tolia, Sunit, DO  ?atorvastatin (LIPITOR) 40 MG tablet Take 1 tablet (40 mg total) by mouth at bedtime. 08/14/21   Tolia, Sunit, DO  ?clopidogrel (PLAVIX) 75 MG tablet Take 1 tablet (75 mg total) by mouth daily. 02/15/21   Tolia, Sunit, DO  ?famotidine (PEPCID) 40 MG tablet Take 1 tablet (40 mg total) by mouth at bedtime. 10/23/21 01/21/22  Vladimir Crofts, PA-C  ?finasteride (PROSCAR) 5 MG tablet TAKE 1 TABLET BY MOUTH EVERY DAY 06/25/19   Janith Lima, MD  ?gabapentin (NEURONTIN) 100 MG capsule Take 100 mg by mouth 2 (two) times daily. 05/06/21   [provider]  ?metoprolol succinate (TOPROL XL) 25 MG 24 hr tablet Take 1 tablet (25 mg total) by mouth every morning. 08/14/21 11/12/21  Tolia, Sunit, DO  ?nitroGLYCERIN (NITROSTAT) 0.4 MG SL tablet PLACE 1 TABLET (0.4 MG TOTAL) UNDER THE TONGUE EVERY 5 (FIVE) MINUTES AS NEEDED FOR CHEST PAIN. IF YOU REQUIRE MORE THAN TWO TABLETS FIVE MINUTES APART GO TO THE NEAREST ER VIA EMS. 01/16/21 10/03/21  Adrian Prows, MD  ?pantoprazole (PROTONIX) 40 MG tablet Take 40 mg by mouth 2 (two) times daily.    [provider]  ?sucralfate (CARAFATE) 1 g tablet Take 1 tablet (1 g total) by mouth 4 (four) times daily -  with meals and at bedtime for 10 days. 10/23/21 11/02/21  Vladimir Crofts, PA-C  ?tamsulosin (FLOMAX) 0.4 MG CAPS capsule Take 0.4 mg by mouth daily.    [provider]  ? ? ?Family History ?Family History  ?Problem Relation Age of Onset  ? Diabetes Brother   ? Hyperlipidemia Mother   ? Obesity Sister   ? Hypertension Sister   ? Diabetes Sister   ? Eating disorder Daughter   ? Eczema Son   ? Irritable bowel syndrome Son   ? Cancer Neg Hx   ? Early death Neg Hx   ? Heart disease Neg Hx   ? Kidney disease Neg Hx   ?  Stroke Neg Hx   ? Alcohol abuse Neg Hx   ? Colon cancer Neg Hx   ? Esophageal cancer Neg Hx   ? Stomach cancer Neg Hx   ? Rectal cancer Neg Hx   ? ? ?Social History ?Social History  ? ?Tobacco Use  ? Smoking status: Former  ?  Packs/day: 1.00  ?  Years: 20.00  ?  Pack years: 20.00  ?  Types: Cigarettes  ?  Quit date: 2014  ?  Years since quitting: 9.2  ?  Smokeless tobacco: Never  ?Vaping Use  ? Vaping Use: Never used  ?Substance Use Topics  ? Alcohol use: No  ?  Alcohol/week: 0.0 standard drinks  ? Drug use: Never  ? ? ? ?Allergies   ?Hydrocodone-acetaminophen and Oxycodone ? ? ?Review of Systems ?Review of Systems  ?Constitutional:  Negative for chills and fever.  ?Eyes:  Negative for discharge and redness.  ?Respiratory:  Negative for shortness of breath.   ?Gastrointestinal:  Positive for abdominal pain, diarrhea, nausea and vomiting. Negative for abdominal distention and blood in stool.  ? ? ?Physical Exam ?Triage Vital Signs ?ED Triage Vitals  ?Enc Vitals Group  ?   BP   ?   Pulse   ?   Resp   ?   Temp   ?   Temp src   ?   SpO2   ?   Weight   ?   Height   ?   Head Circumference   ?   Peak Flow   ?   Pain Score   ?   Pain Loc   ?   Pain Edu?   ?   Excl. in [place]?   ? ?No data found. ? ?Updated Vital Signs ?BP 122/82 (BP Location: Left Arm)   Pulse 62   Temp 98 ?F (36.7 ?C) (Oral)   Resp 16   SpO2 99%  ?   ? ?Physical Exam ?Vitals and nursing note reviewed.  ?Constitutional:   ?   General: He is not in acute distress. ?   Appearance: Normal appearance. He is not ill-appearing.  ?HENT:  ?   Head: Normocephalic and atraumatic.  ?Eyes:  ?   Conjunctiva/sclera: Conjunctivae normal.  ?Cardiovascular:  ?   Rate and Rhythm: Normal rate and regular rhythm.  ?   Heart sounds: Normal heart sounds. No murmur heard. ?Pulmonary:  ?   Effort: Pulmonary effort is normal. No respiratory distress.  ?   Breath sounds: Normal breath sounds. No wheezing, rhonchi or rales.  ?Abdominal:  ?   General: Abdomen is flat. Bowel sounds are  normal. There is no distension.  ?   Tenderness: There is abdominal tenderness (mild epigastric, LUQ TTP). There is no guarding.  ?Neurological:  ?   Mental Status: He is alert.  ?Psychiatric:     ?   Mood an

## 2021-11-26 NOTE — ED Triage Notes (Signed)
Epigastric pain with nausea x 2 days. Dull in nature, after he eats it gets sharp and causes him to vomit. Has been fasting during the day for religious purposes, eats only after dark, that's when the pain increases dramatically. Denies any hematemesis  ?

## 2021-11-27 LAB — COMPREHENSIVE METABOLIC PANEL WITH GFR
ALT: 12 [IU]/L (ref 0–44)
AST: 18 [IU]/L (ref 0–40)
Albumin/Globulin Ratio: 1.3 (ref 1.2–2.2)
Albumin: 4.6 g/dL (ref 3.8–4.8)
Alkaline Phosphatase: 88 [IU]/L (ref 44–121)
BUN/Creatinine Ratio: 11 (ref 10–24)
BUN: 13 mg/dL (ref 8–27)
Bilirubin Total: 0.4 mg/dL (ref 0.0–1.2)
CO2: 24 mmol/L (ref 20–29)
Calcium: 10.6 mg/dL — ABNORMAL HIGH (ref 8.6–10.2)
Chloride: 98 mmol/L (ref 96–106)
Creatinine, Ser: 1.17 mg/dL (ref 0.76–1.27)
Globulin, Total: 3.6 g/dL (ref 1.5–4.5)
Glucose: 103 mg/dL — ABNORMAL HIGH (ref 70–99)
Potassium: 5.1 mmol/L (ref 3.5–5.2)
Sodium: 134 mmol/L (ref 134–144)
Total Protein: 8.2 g/dL (ref 6.0–8.5)
eGFR: 71 mL/min/{1.73_m2}

## 2021-11-27 LAB — CBC WITH DIFFERENTIAL/PLATELET
Basophils Absolute: 0.1 10*3/uL (ref 0.0–0.2)
Basos: 1 %
EOS (ABSOLUTE): 0.5 10*3/uL — ABNORMAL HIGH (ref 0.0–0.4)
Eos: 10 %
Hematocrit: 42.9 % (ref 37.5–51.0)
Hemoglobin: 14.2 g/dL (ref 13.0–17.7)
Immature Grans (Abs): 0 10*3/uL (ref 0.0–0.1)
Immature Granulocytes: 0 %
Lymphocytes Absolute: 1.7 10*3/uL (ref 0.7–3.1)
Lymphs: 30 %
MCH: 26 pg — ABNORMAL LOW (ref 26.6–33.0)
MCHC: 33.1 g/dL (ref 31.5–35.7)
MCV: 78 fL — ABNORMAL LOW (ref 79–97)
Monocytes Absolute: 0.5 10*3/uL (ref 0.1–0.9)
Monocytes: 8 %
Neutrophils Absolute: 2.9 10*3/uL (ref 1.4–7.0)
Neutrophils: 51 %
Platelets: 158 10*3/uL (ref 150–450)
RBC: 5.47 x10E6/uL (ref 4.14–5.80)
RDW: 14.3 % (ref 11.6–15.4)
WBC: 5.6 10*3/uL (ref 3.4–10.8)

## 2021-11-27 LAB — AMYLASE: Amylase: 129 U/L — ABNORMAL HIGH (ref 31–110)

## 2021-11-27 LAB — LIPASE: Lipase: 22 U/L (ref 13–78)

## 2021-11-27 LAB — H. PYLORI ANTIBODY, IGG: H. pylori, IgG AbS: 7.86 {index_val} — ABNORMAL HIGH (ref 0.00–0.79)

## 2021-12-05 ENCOUNTER — Encounter: Payer: Self-pay | Admitting: Family Medicine

## 2021-12-05 ENCOUNTER — Ambulatory Visit (INDEPENDENT_AMBULATORY_CARE_PROVIDER_SITE_OTHER): Payer: 59 | Admitting: Family Medicine

## 2021-12-05 VITALS — BP 124/76 | HR 64 | Temp 97.7°F | Ht 67.0 in | Wt 184.0 lb

## 2021-12-05 DIAGNOSIS — R7689 Other specified abnormal immunological findings in serum: Secondary | ICD-10-CM

## 2021-12-05 DIAGNOSIS — A084 Viral intestinal infection, unspecified: Secondary | ICD-10-CM

## 2021-12-05 DIAGNOSIS — K2100 Gastro-esophageal reflux disease with esophagitis, without bleeding: Secondary | ICD-10-CM | POA: Diagnosis not present

## 2021-12-05 DIAGNOSIS — R768 Other specified abnormal immunological findings in serum: Secondary | ICD-10-CM

## 2021-12-05 DIAGNOSIS — N401 Enlarged prostate with lower urinary tract symptoms: Secondary | ICD-10-CM

## 2021-12-05 DIAGNOSIS — L299 Pruritus, unspecified: Secondary | ICD-10-CM

## 2021-12-05 DIAGNOSIS — N138 Other obstructive and reflux uropathy: Secondary | ICD-10-CM

## 2021-12-05 DIAGNOSIS — L209 Atopic dermatitis, unspecified: Secondary | ICD-10-CM

## 2021-12-05 MED ORDER — GABAPENTIN 100 MG PO CAPS: 100.0000 mg | ORAL_CAPSULE | Freq: Two times a day (BID) | ORAL | 1 refills | Status: DC

## 2021-12-05 NOTE — Patient Instructions (Addendum)
Please call [facility] and follow up for positive H. Pylori blood test after treatment.  ?[phone] ? ? ?Dermatology offices ? ?[facility]: Phone #: [phone] ?Address: [address], [place], [place] [address] ? ?[facility]: Phone: [phone] ? Address: [address] [place], [place] [address] ? ?[facility]: [phone] ?Address: [address] ?[place], [place] [address] ? ?[facility] ?Address: [place], [place], [place] [address] ?Phone: [phone]  ? ?Call to schedule a visit with a dermatologist in your insurance network.  ? ? ?

## 2021-12-05 NOTE — Progress Notes (Signed)
? ?Subjective:  ? ? Patient ID: Eugene Mccullough, male    DOB: 1960-12-19, 61 y.o.   MRN: [id number] ? ?HPI ?Chief Complaint  ?Patient presents with  ? Diarrhea  ?  Upset stomach for about a week.  ? ?States he is here to discuss recent positive H. Pylori blood test done at urgent care. States he has been treated for H. Pylori several times including in the past 6 months.  ? ?States he had a 3 day history of nausea, vomiting and diarrhea so he went to UC to be evaluated. States he continues having some generalized discomfort but mostly back to baseline. No fever, chills, headache, dizziness, chest pain, palpitations, shortness of breath, vomiting or diarrhea.  ? ?States he has been fasting for Ramadan.  ? ?Taking omeprazole prescribed by his GI for GERD and denies reflux.  ? ?Requesting a refill of gabapentin. States he was prescribed 100 mg twice daily for itching of his back and legs by his dermatologist but his dermatologist has since retired. State he plans to schedule with a new derm.  ? ?He is also requesting a referral to a new urologist for BPH with urinary frequency and dysuria. States he was evaluated and treated and is currently on medication for symptoms. He would like to establish with a new practice for a new opinion.  ? ? ? ?Past Medical History:  ?Diagnosis Date  ? Anemia   ? Anginal pain ([place])   ? Arthritis   ? BPH (benign prostatic hyperplasia)   ? Coronary artery disease   ? Eczema   ? Gastric ulcer   ? GERD (gastroesophageal reflux disease)   ? Gout   ? H. pylori infection   ? Hyperlipidemia   ? Hypertension   ? IBS (irritable bowel syndrome)   ? Pre-diabetes   ? Sleep apnea   ? Does not use CPAP  ? ? ? ? ?Review of Systems ?Pertinent positives and negatives in the history of present illness. ? ?   ?Objective:  ? Physical Exam ?BP 124/76 (BP Location: Left Arm, Patient Position: Sitting, Cuff Size: Large)   Pulse 64   Temp 97.7 ?F (36.5 ?C) (Temporal)   Ht '5\' 7"'$  (1.702 m)   Wt 184 lb (83.5 kg)    SpO2 98%   BMI 28.82 kg/m?  ? ?Alert and in no distress.  Cardiac exam shows a regular rate and rhythm.  Lungs are clear to auscultation. Abdomen is soft, non distended, no focal tenderness to palpation but generalized tenderness without rebound, guarding or referred pain, normal BS and no palpable masses. Extremities without edema. Skin is warm and dry, no rash or pallor. Normal mood.  Declines GU exam. ? ? ?   ?Assessment & Plan:  ?Helicobacter pylori antibody positive ?-Reports being treated on multiple occasions for H. pylori including in the past 6 months.  I do not suspect this contributed to the previous GI illness.  Discussed that I will defer to his GI for the next step in regards to positive H. pylori blood test after treatment. ? ?BPH with obstruction/lower urinary tract symptoms - Plan: Ambulatory referral to Urology ?-He declines GU exam.  Declines UA.  He will continue his current medications as prescribed including finasteride and tamsulosin.  I will refer him to urology outside of [facility]. ? ?Viral gastroenteritis ?-He is essentially back to baseline.  He is fasting for Ramadan.  Follow-up if symptoms return. ? ?Gastroesophageal reflux disease with esophagitis without hemorrhage ?-Continue omeprazole and  lifestyle modifications for management of GERD.  Follow-up with GI. ? ?Atopic dermatitis, unspecified type ?-He will call and schedule with a new dermatologist since his previous one has retired.  Refilled gabapentin per patient request.  He has been doing well on the medication since it was prescribed by his previous dermatologist. ? ?Pruritic condition ?-He has been treated by dermatology with gabapentin for pruritus without a rash.  He will call and schedule new dermatologist.  Refilled gabapentin. ? ? ?

## 2021-12-06 ENCOUNTER — Other Ambulatory Visit: Payer: Self-pay | Admitting: Cardiology

## 2021-12-12 ENCOUNTER — Ambulatory Visit: Payer: Self-pay | Admitting: Emergency Medicine

## 2021-12-13 ENCOUNTER — Other Ambulatory Visit: Payer: Self-pay | Admitting: Cardiology

## 2021-12-13 DIAGNOSIS — I25119 Atherosclerotic heart disease of native coronary artery with unspecified angina pectoris: Secondary | ICD-10-CM

## 2021-12-13 DIAGNOSIS — R002 Palpitations: Secondary | ICD-10-CM

## 2021-12-21 ENCOUNTER — Ambulatory Visit: Payer: 59 | Admitting: Cardiology

## 2021-12-25 ENCOUNTER — Encounter: Payer: Self-pay | Admitting: Emergency Medicine

## 2021-12-25 ENCOUNTER — Other Ambulatory Visit: Payer: Self-pay | Admitting: Emergency Medicine

## 2021-12-25 ENCOUNTER — Encounter: Payer: Self-pay | Admitting: Family Medicine

## 2021-12-25 MED ORDER — FINASTERIDE 5 MG PO TABS: 5.0000 mg | ORAL_TABLET | Freq: Every day | ORAL | 1 refills | Status: DC

## 2021-12-25 NOTE — Telephone Encounter (Signed)
Prescription sent to pharmacy of record.  Thanks.

## 2021-12-27 NOTE — Telephone Encounter (Signed)
Attempted to call Dr.Coughlin's office at urology twice with no results. Phone kept ringing until automatically hung up. Will try again later. ?

## 2021-12-27 NOTE — Telephone Encounter (Signed)
Contacted Dr. Willaim Bane office to check status of referral and they informed me that he is not accepting new patients at this time but did let me know they have another Urology office at [address] [place], [place] [address] that we can send the referral to. Called pt and pt stated he is ok with referral going there. ? ?Can you switch the referral to go to that urology office please?  ?

## 2021-12-28 ENCOUNTER — Ambulatory Visit: Payer: 59 | Admitting: Emergency Medicine

## 2022-01-02 ENCOUNTER — Encounter: Payer: Self-pay | Admitting: Gastroenterology

## 2022-01-02 ENCOUNTER — Encounter: Payer: Self-pay | Admitting: Emergency Medicine

## 2022-01-06 ENCOUNTER — Other Ambulatory Visit: Payer: Self-pay | Admitting: Cardiology

## 2022-01-06 DIAGNOSIS — R002 Palpitations: Secondary | ICD-10-CM

## 2022-01-06 DIAGNOSIS — I25119 Atherosclerotic heart disease of native coronary artery with unspecified angina pectoris: Secondary | ICD-10-CM

## 2022-01-17 ENCOUNTER — Ambulatory Visit: Payer: 59 | Admitting: Gastroenterology

## 2022-01-30 ENCOUNTER — Encounter: Payer: Self-pay | Admitting: Physician Assistant

## 2022-01-30 ENCOUNTER — Other Ambulatory Visit (INDEPENDENT_AMBULATORY_CARE_PROVIDER_SITE_OTHER): Payer: 59

## 2022-01-30 ENCOUNTER — Ambulatory Visit (INDEPENDENT_AMBULATORY_CARE_PROVIDER_SITE_OTHER): Payer: 59 | Admitting: Physician Assistant

## 2022-01-30 VITALS — BP 110/66 | HR 80 | Ht 67.0 in | Wt 179.4 lb

## 2022-01-30 DIAGNOSIS — K449 Diaphragmatic hernia without obstruction or gangrene: Secondary | ICD-10-CM | POA: Diagnosis not present

## 2022-01-30 DIAGNOSIS — K219 Gastro-esophageal reflux disease without esophagitis: Secondary | ICD-10-CM

## 2022-01-30 DIAGNOSIS — M549 Dorsalgia, unspecified: Secondary | ICD-10-CM

## 2022-01-30 DIAGNOSIS — R1013 Epigastric pain: Secondary | ICD-10-CM

## 2022-01-30 LAB — COMPREHENSIVE METABOLIC PANEL
ALT: 13 U/L (ref 0–53)
AST: 15 U/L (ref 0–37)
Albumin: 4.1 g/dL (ref 3.5–5.2)
Alkaline Phosphatase: 69 U/L (ref 39–117)
BUN: 15 mg/dL (ref 6–23)
CO2: 27 mEq/L (ref 19–32)
Calcium: 9.7 mg/dL (ref 8.4–10.5)
Chloride: 103 mEq/L (ref 96–112)
Creatinine, Ser: 1.03 mg/dL (ref 0.40–1.50)
GFR: 78.48 mL/min (ref >60.00)
Glucose, Bld: 115 mg/dL — ABNORMAL HIGH (ref 70–99)
Potassium: 4.5 mEq/L (ref 3.5–5.1)
Sodium: 137 mEq/L (ref 135–145)
Total Bilirubin: 0.5 mg/dL (ref 0.2–1.2)
Total Protein: 7.9 g/dL (ref 6.0–8.3)

## 2022-01-30 NOTE — Progress Notes (Signed)
Subjective:    Patient ID: Eugene Mccullough, male    DOB: May 09, 1961, 61 y.o.   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  He has history of chronic GERD, large hiatal hernia, previous history of H. pylori treated, IBS, coronary artery disease status post CABG, hypertension, sleep apnea and BPH. He status post remote cholecystectomy 15 years ago. He comes in today for evaluation after a recent episode of severe abdominal pain.  He had urgent care evaluation on 11/26/2021.  He says that he developed epigastric pain which was episodic and lasted altogether for about a month.  His symptoms have subsided over the past 2 weeks.  He describes episode of severe epigastric pain radiating into his back, and usually lasting for 4 to 5 hours and then gradually resolving.  No associated nausea or vomiting, no fever, no diarrhea or melena.  He has not had any previous similar symptoms. Fortunately has not recurred over the past 2 weeks. He did undergo recent evaluation here with a EGD on 11/10/2021 with finding of a 6 cm hiatal hernia, and otherwise negative exam.  He has been on Protonix 40 mg p.o. twice daily before meals and Pepcid 40 mg at bedtime. He was referred to [facility] for consideration of hiatal hernia repair and did see Dr. Rosendo Gros in March 2023 who felt that he would be a good candidate for robotic hiatal hernia repair with Nissen fundoplication.  Patient was not ready to commit to surgery and today says he is still considering this but leaning away from surgery. Says most of the time his symptoms are fairly well controlled with twice daily PPI and famotidine at bedtime.  He does have Carafate which he has used as needed, has not taken regularly at present.  Did have an H. pylori IgG done at the time of the urgent care visit and this returned positive.  He was concerned that perhaps H. pylori is what was causing his episode of severe pain. Other labs done on  11/26/2021 with normal CBC, c-Met normal with exception of calcium at 10.6, lipase within normal limits  Patient had previously undergone work-up through [facility] with esophageal manometry and pH study 2021.  Manometry was normal, pH study positive with DeMeester score of 61 consistent with GERD. Last colonoscopy here 2019 with multiple diverticuli, and a 1 mm polyp in the descending colon which was benign colonic mucosa by path.  Indicated for 10-year interval follow-up    Review of Systems. Pertinent positive and negative review of systems were noted in the above HPI section.  All other review of systems was otherwise negative.   Outpatient Encounter Medications as of 01/30/2022  Medication Sig   amLODipine (NORVASC) 5 MG tablet TAKE 1 TABLET (5 MG TOTAL) BY MOUTH DAILY.   atorvastatin (LIPITOR) 40 MG tablet Take 1 tablet (40 mg total) by mouth at bedtime.   clopidogrel (PLAVIX) 75 MG tablet TAKE 1 TABLET BY MOUTH EVERY DAY   [employer] ASPIRIN LOW DOSE 81 MG EC tablet TAKE 1 TABLET (81 MG TOTAL) BY MOUTH DAILY. SWALLOW WHOLE.   famotidine (PEPCID) 40 MG tablet Take 1 tablet (40 mg total) by mouth at bedtime.   finasteride (PROSCAR) 5 MG tablet Take 1 tablet (5 mg total) by mouth daily.   metoprolol succinate (TOPROL-XL) 25 MG 24 hr tablet TAKE 1 TABLET BY MOUTH EVERY DAY IN THE MORNING   pantoprazole (PROTONIX) 40 MG tablet Take 40 mg by  mouth 2 (two) times daily.   sucralfate (CARAFATE) 1 g tablet Take 1 tablet (1 g total) by mouth 4 (four) times daily -  with meals and at bedtime for 10 days.   tamsulosin (FLOMAX) 0.4 MG CAPS capsule Take 0.4 mg by mouth daily.   gabapentin (NEURONTIN) 100 MG capsule Take 1 capsule (100 mg total) by mouth 2 (two) times daily. (Patient not taking: Reported on 01/30/2022)   nitroGLYCERIN (NITROSTAT) 0.4 MG SL tablet PLACE 1 TABLET (0.4 MG TOTAL) UNDER THE TONGUE EVERY 5 (FIVE) MINUTES AS NEEDED FOR CHEST PAIN. IF YOU REQUIRE MORE THAN TWO TABLETS FIVE MINUTES APART GO  TO THE NEAREST ER VIA EMS.   Facility-Administered Encounter Medications as of 01/30/2022  Medication   0.9 %  sodium chloride infusion   Allergies  Allergen Reactions   Hydrocodone-Acetaminophen Itching   Oxycodone Itching   Patient Active Problem List   Diagnosis Date Noted   History of coronary artery disease 10/03/2021   Hx of CABG 01/10/2021   Essential hypertension 01/06/2021   Mixed hyperlipidemia 01/06/2021   Anemia    BPH (benign prostatic hyperplasia)    Eczema    GERD (gastroesophageal reflux disease)    Hyperlipidemia    IBS (irritable bowel syndrome)    Sleep apnea    CAD (coronary artery disease) 12/20/2020   Precordial pain    DDD (degenerative disc disease), lumbar 08/15/2018   Pes cavus 08/15/2018   History of IBS 08/15/2018   Lesion of oral mucosa 08/14/2018   Generalized abdominal tenderness without rebound tenderness 08/14/2018   Borderline results on serologic testing for Lyme disease 07/05/2018   Arthralgia 07/02/2018   Lumbar radiculopathy 09/12/2017   PUD (peptic ulcer disease) 10/28/2014   Allergic rhinitis, cause unspecified 01/15/2014   Dermatitis, atopic 05/05/2013   Obstructive sleep apnea 02/02/2013   Routine general medical examination at a health care facility 01/02/2013   BPH with obstruction/lower urinary tract symptoms 01/02/2013   Hypertension 08/15/2011   Obesity (BMI 30-39.9)    Social History   Socioeconomic History   Marital status: Married    Spouse name: Not on file   Number of children: 3   Years of education: 17   Highest education level: Not on file  Occupational History   Occupation: driver  Tobacco Use   Smoking status: Former    Packs/day: 1.00    Years: 20.00    Pack years: 20.00    Types: Cigarettes    Quit date: 2014    Years since quitting: 9.4   Smokeless tobacco: Never  Vaping Use   Vaping Use: Never used  Substance and Sexual Activity   Alcohol use: No    Alcohol/week: 0.0 standard drinks   Drug  use: Never   Sexual activity: Yes  Other Topics Concern   Not on file  Social History Narrative   Not on file   Social Determinants of Health   Financial Resource Strain: Not on file  Food Insecurity: Not on file  Transportation Needs: Not on file  Physical Activity: Not on file  Stress: Not on file  Social Connections: Not on file  Intimate Partner Violence: Not on file    Eugene Mccullough's family history includes Diabetes in his brother and sister; Eating disorder in his daughter; Eczema in his son; Hyperlipidemia in his mother; Hypertension in his sister; Irritable bowel syndrome in his son; Obesity in his sister.      Objective:    Vitals:   01/30/22 0944  BP: 110/66  Pulse: 80    Physical Exam Well-developed well-nourished older male in no acute distress.  Pleasant ,weight, 179 BMI 28.1  HEENT; nontraumatic normocephalic, EOMI, PE R LA, sclera anicteric. Oropharynx; not examined today Neck; supple, no JVD Cardiovascular; regular rate and rhythm with S1-S2, no murmur rub or gallop Pulmonary; Clear bilaterally Abdomen; soft, nontender, nondistended, no palpable mass or hepatosplenomegaly, bowel sounds are active Rectal; not done today Skin; benign exam, no jaundice rash or appreciable lesions Extremities; no clubbing cyanosis or edema skin warm and dry Neuro/Psych; alert and oriented x4, grossly nonfocal mood and affect appropriate        Assessment & Plan:   #27 61 year old male with chronic significant GERD requiring high-dose PPI and H2 blocker with fairly good control of symptoms. #2 large hiatal hernia-6 cm documented at recent EGD.  Patient has had initial consultation with surgery regarding robotic hiatal hernia repair and Nissen fundoplication.  He is currently reluctant to proceed with surgery  #3 status post remote cholecystectomy #4 previous history of H. pylori, treated-H. pylori IgG + April 2023-does not indicate active infection   #5 recent 1 month  history of episodic severe epigastric pain radiating to the back, with symptoms lasting 4 to 5 hours and then gradually resolving.  No symptoms over the past 2 weeks Etiology not clear Labs done on 11/26/2021 with normal LFTs and CBC, noted mild hypercalcemia  He is status post cholecystectomy, rule out choledocholithiasis, rule out other intra-abdominal inflammatory process, occult neoplasm.  #6 diverticulosis #7.  Colon cancer screening-up-to-date last colonoscopy 2019 no adenomatous polyp #8 coronary artery disease status post CABG #9.  Hypertension #10.  Sleep apnea #11.  BPH #12 prior history of peptic ulcer disease  Plan; continue Protonix 40 mg p.o. twice daily AC breakfast and AC dinner Continue famotidine 40 mg p.o. nightly Patient is not taking Carafate regularly at present. He is a poor candidate to come off of PPI for 2 weeks in order to do H. pylori stool antigen, so will not pursue at this time.  I do not think H. pylori had anything to do with his recent episodes of severe pain, and H. pylori IgG will remain positive indefinitely after exposure  Check c-Met today Scheduled for CT scan of the abdomen and pelvis with contrast. Further recommendations pending findings of CT.  Berry Godsey S Natoria Archibald PA-C 01/30/2022   Cc: [place], [place]

## 2022-01-30 NOTE — Patient Instructions (Signed)
If you are age 61 or younger, your body mass index should be between 19-25. Your Body mass index is 28.1 kg/m. If this is out of the aformentioned range listed, please consider follow up with your Primary Care Provider.  ________________________________________________________  The [facility] providers would like to encourage you to use [facility] to communicate with providers for non-urgent requests or questions.  Due to long hold times on the telephone, sending your provider a message by [facility] may be a faster and more efficient way to get a response.  Please allow 48 business hours for a response.  Please remember that this is for non-urgent requests.  _______________________________________________________  Your provider has requested that you go to the basement level for lab work before leaving today. Press "B" on the elevator. The lab is located at the first door on the left as you exit the elevator.  You have been scheduled for a CT scan of the abdomen and pelvis at [facility] ([address]---this is in the same building as [employer]).   You are scheduled on 02/06/2022 at 11:30 am. You should arrive 15 minutes prior to your appointment time for registration. Please follow the written instructions below on the day of your exam:  WARNING: IF YOU ARE ALLERGIC TO IODINE/X-RAY DYE, PLEASE NOTIFY RADIOLOGY IMMEDIATELY AT [phone]! YOU WILL BE GIVEN A 13 HOUR PREMEDICATION PREP.  1) Do not eat anything after 7:30 am (4 hours prior to your test) 2) You have been given 2 bottles of oral contrast to drink. The solution may taste better if refrigerated, but do NOT add ice or any other liquid to this solution. Shake well before drinking.    Drink 1 bottle of contrast @ 9:30 am (2 hours prior to your exam)  Drink 1 bottle of contrast @ 10:30 am (1 hour prior to your exam)  You may take any medications as prescribed with a small amount of water, if necessary. If you take  any of the following medications: METFORMIN, GLUCOPHAGE, GLUCOVANCE, AVANDAMET, RIOMET, FORTAMET, [place] MET, JANUMET, GLUMETZA or METAGLIP, you MAY be asked to HOLD this medication 48 hours AFTER the exam.  The purpose of you drinking the oral contrast is to aid in the visualization of your intestinal tract. The contrast solution may cause some diarrhea. Depending on your individual set of symptoms, you may also receive an intravenous injection of x-ray contrast/dye. Plan on being at [facility] for 30 minutes or longer, depending on the type of exam you are having performed.  This test typically takes 30-45 minutes to complete.  If you have any questions regarding your exam or if you need to reschedule, you may call the CT department at [phone] between the hours of 8:00 am and 5:00 pm, Monday-Friday.  ________________________________________________________________________  Continue Pantoprazole twice daily and Famotidine at bedtime.  Follow up pending the results of your CT.  Thank you for entrusting me with your care and choosing [facility].  Nicoletta Ba, PA

## 2022-02-06 ENCOUNTER — Ambulatory Visit (INDEPENDENT_AMBULATORY_CARE_PROVIDER_SITE_OTHER)
Admission: RE | Admit: 2022-02-06 | Discharge: 2022-02-06 | Disposition: A | Discharge status: Home / Self Care | Payer: 59 | Source: Ambulatory Visit | Attending: Physician Assistant | Admitting: Physician Assistant

## 2022-02-06 DIAGNOSIS — K449 Diaphragmatic hernia without obstruction or gangrene: Secondary | ICD-10-CM | POA: Diagnosis not present

## 2022-02-06 DIAGNOSIS — K219 Gastro-esophageal reflux disease without esophagitis: Secondary | ICD-10-CM

## 2022-02-06 DIAGNOSIS — R1013 Epigastric pain: Secondary | ICD-10-CM | POA: Diagnosis not present

## 2022-02-06 DIAGNOSIS — M549 Dorsalgia, unspecified: Secondary | ICD-10-CM

## 2022-02-06 MED ORDER — IOHEXOL 300 MG/ML  SOLN
100.0000 mL | Freq: Once | INTRAMUSCULAR | Status: AC | PRN
  Administered 2022-02-06: 100 mL via INTRAVENOUS

## 2022-02-12 NOTE — Progress Notes (Signed)
I agree with the above note, plan 

## 2022-02-27 ENCOUNTER — Other Ambulatory Visit: Payer: Self-pay | Admitting: Emergency Medicine

## 2022-02-28 MED ORDER — TAMSULOSIN HCL 0.4 MG PO CAPS
0.4000 mg | ORAL_CAPSULE | Freq: Every day | ORAL | 2 refills | Status: DC
Start: 2022-02-28 — End: 2022-04-05

## 2022-02-28 NOTE — Telephone Encounter (Signed)
Any update on this referral as Dr. Willaim Bane office is not accepting new patients?

## 2022-03-09 ENCOUNTER — Encounter: Payer: Self-pay | Admitting: Cardiology

## 2022-03-09 ENCOUNTER — Ambulatory Visit: Payer: 59 | Admitting: Cardiology

## 2022-03-09 VITALS — BP 127/76 | HR 86 | Temp 97.9°F | Resp 16 | Ht 67.0 in | Wt 200.0 lb

## 2022-03-09 DIAGNOSIS — I25119 Atherosclerotic heart disease of native coronary artery with unspecified angina pectoris: Secondary | ICD-10-CM

## 2022-03-09 DIAGNOSIS — Z951 Presence of aortocoronary bypass graft: Secondary | ICD-10-CM

## 2022-03-09 DIAGNOSIS — I10 Essential (primary) hypertension: Secondary | ICD-10-CM

## 2022-03-09 DIAGNOSIS — I700 Atherosclerosis of aorta: Secondary | ICD-10-CM

## 2022-03-09 DIAGNOSIS — Z87891 Personal history of nicotine dependence: Secondary | ICD-10-CM

## 2022-03-09 DIAGNOSIS — E782 Mixed hyperlipidemia: Secondary | ICD-10-CM

## 2022-03-09 NOTE — Progress Notes (Signed)
ID:  Eugene Mccullough, DOB [birth year], MRN [id number]  PCP:  Horald Pollen, MD  Cardiologist:  Rex Kras, DO, [facility] (established care 11/25/2020) Former Cardiology Providers: Dr. Lyman Bishop   Date: 03/09/22 Last Office Visit: 09/08/2021  Chief Complaint  Patient presents with   Coronary Artery Disease   Follow-up    6 month    HPI  Richrd Kuzniar is a 61 y.o. African-American male whose past medical history and cardiovascular risk factors include: Multivessel CAD status post four-vessel bypass surgery, HTN, HLD, BPH, mild coronary artery calcification, 26-pack-year history of smoking (quit in 2014), obesity due to excess calories.  Referred to the office for evaluation of chest pain in April 2022.  He underwent coronary CTA which was concerning for obstructive CAD and underwent left heart catheterization.  He had multivessel CAD and subsequently underwent four-vessel bypass with Dr. [name] in May 2022.  After bypass surgery he continued to complain of precordial discomfort which appeared to be noncardiac but due to recurrent office visits he did undergo a repeat MPI which was reported to be low risk.  He was referred to GI for possible GERD.  He was diagnosed with H. pylori and heartburn and after treatment his symptoms of precordial discomfort has resolved.  He now presents for 35-monthfollow-up visit.  Denies anginal discomfort or heart failure symptoms.  Unfortunately, due to dietary indiscretion patient has gained 21 pounds since last office visit.  He is not as active as he was before.  FUNCTIONAL STATUS: Walks 20-232mutes daily.   ALLERGIES: Allergies  Allergen Reactions   Hydrocodone-Acetaminophen Itching   Oxycodone Itching    MEDICATION LIST PRIOR TO VISIT: Current Meds  Medication Sig   amLODipine (NORVASC) 5 MG tablet TAKE 1 TABLET (5 MG TOTAL) BY MOUTH DAILY.   atorvastatin (LIPITOR) 40 MG tablet Take 1 tablet (40 mg total) by mouth at bedtime.    clopidogrel (PLAVIX) 75 MG tablet TAKE 1 TABLET BY MOUTH EVERY DAY   [employer] ASPIRIN LOW DOSE 81 MG EC tablet TAKE 1 TABLET (81 MG TOTAL) BY MOUTH DAILY. SWALLOW WHOLE.   famotidine (PEPCID) 40 MG tablet Take 1 tablet (40 mg total) by mouth at bedtime.   finasteride (PROSCAR) 5 MG tablet Take 1 tablet (5 mg total) by mouth daily.   metoprolol succinate (TOPROL-XL) 25 MG 24 hr tablet TAKE 1 TABLET BY MOUTH EVERY DAY IN THE MORNING   pantoprazole (PROTONIX) 40 MG tablet Take 40 mg by mouth 2 (two) times daily.   tamsulosin (FLOMAX) 0.4 MG CAPS capsule Take 1 capsule (0.4 mg total) by mouth daily.   Current Facility-Administered Medications for the 03/09/22 encounter (Office Visit) with ToRex KrasDO  Medication   0.9 %  sodium chloride infusion     PAST MEDICAL HISTORY: Past Medical History:  Diagnosis Date   Anemia    Anginal pain (HCC)    Arthritis    BPH (benign prostatic hyperplasia)    Coronary artery disease    Eczema    Gastric ulcer    GERD (gastroesophageal reflux disease)    Gout    H. pylori infection    Hyperlipidemia    Hypertension    IBS (irritable bowel syndrome)    Pre-diabetes    Sleep apnea    Does not use CPAP    PAST SURGICAL HISTORY: Past Surgical History:  Procedure Laterality Date   CARDIAC CATHETERIZATION     2022   CHOLECYSTECTOMY  2005   COLONOSCOPY  08/2010  CORONARY ARTERY BYPASS GRAFT N/A 01/10/2021   Procedure: CORONARY ARTERY BYPASS GRAFTING (CABG) X  FOUR ON PUMP USING LEFT INTERNAL MAMMARY ARTERY, LEFT RADIAL ARTERY, RIGHT ENDOSCOPIC GREATER SAPHENOUS VEIN HARVEST CONDUITS;  Surgeon: Lajuana Matte, MD;  Location: [facility];  Service: Open Heart Surgery;  Laterality: N/A;   EXPLORATION POST OPERATIVE OPEN HEART N/A 01/11/2021   Procedure: , MEDIASTINAL WASHOUT;  Surgeon: Lajuana Matte, MD;  Location: [place];  Service: Open Heart Surgery;  Laterality: N/A;   LEFT HEART CATH AND CORONARY ANGIOGRAPHY N/A 12/21/2020   Procedure: LEFT HEART  CATH AND CORONARY ANGIOGRAPHY;  Surgeon: Nigel Mormon, MD;  Location: [facility];  Service: Cardiovascular;  Laterality: N/A;   RADIAL ARTERY HARVEST Left 01/10/2021   Procedure: RADIAL ARTERY HARVEST;  Surgeon: Lajuana Matte, MD;  Location: [place];  Service: Open Heart Surgery;  Laterality: Left;   TEE WITHOUT CARDIOVERSION N/A 01/10/2021   Procedure: TRANSESOPHAGEAL ECHOCARDIOGRAM (TEE);  Surgeon: Lajuana Matte, MD;  Location: [place];  Service: Open Heart Surgery;  Laterality: N/A;   UPPER GI ENDOSCOPY  2019   WISDOM TOOTH EXTRACTION      FAMILY HISTORY: The patient family history includes Diabetes in his brother and sister; Eating disorder in his daughter; Eczema in his son; Hyperlipidemia in his mother; Hypertension in his sister; Irritable bowel syndrome in his son; Obesity in his sister.  SOCIAL HISTORY:  The patient  reports that he quit smoking about 9 years ago. His smoking use included cigarettes. He has a 20.00 pack-year smoking history. He has never used smokeless tobacco. He reports that he does not drink alcohol and does not use drugs.  REVIEW OF SYSTEMS: Review of Systems  Constitutional: Negative for chills and fever.  HENT:  Negative for hoarse voice and nosebleeds.   Eyes:  Negative for discharge, double vision and pain.  Cardiovascular:  Positive for palpitations (Seldom). Negative for chest pain, claudication, dyspnea on exertion, leg swelling, near-syncope, orthopnea, paroxysmal nocturnal dyspnea and syncope.  Respiratory:  Negative for hemoptysis and shortness of breath.   Musculoskeletal:  Negative for muscle cramps and myalgias.  Gastrointestinal:  Positive for heartburn. Negative for abdominal pain, constipation, diarrhea, hematemesis, hematochezia, melena, nausea and vomiting.  Neurological:  Negative for dizziness and light-headedness.    PHYSICAL EXAM:    03/09/2022    1:39 PM 01/30/2022    9:44 AM 12/05/2021    9:32 AM  Vitals with  BMI  Height _0  _1  _2   Weight 200 lbs 179 lbs 6 oz 184 lbs  BMI 31.32 94.49 67.59  Systolic 163 846 659  Diastolic 76 66 76  Pulse 86 80 64    CONSTITUTIONAL: Well-developed and well-nourished. No acute distress.  SKIN: Skin is warm and dry. No rash noted. No cyanosis. No pallor. No jaundice HEAD: Normocephalic and atraumatic.  EYES: No scleral icterus MOUTH/THROAT: Moist oral membranes.  NECK: No JVD present. No thyromegaly noted. No carotid bruits  CHEST Normal respiratory effort. No intercostal retractions.  Sternotomy site healed well. LUNGS: Clear to auscultation bilaterally.  No stridor. No wheezes. No rales.  CARDIOVASCULAR: Regular, positive S1-S2, no murmurs rubs or gallops appreciated ABDOMINAL: No apparent ascites.  EXTREMITIES: No peripheral edema.  Left radial site healing well, clean dry and intact.  Right lower extremity harvesting site has some induration but no signs of infection. HEMATOLOGIC: No significant bruising NEUROLOGIC: Oriented to person, place, and time. Nonfocal. Normal muscle tone.  PSYCHIATRIC: Normal mood  and affect. Normal behavior. Cooperative  CARDIAC DATABASE: Coronary artery bypass grafting surgery: Jan 10, 2021 by Dr. [name] LIMA-LAD, Left radial artery - OM2 (proximal of diagonal vein graft), RSVG D1, and D2.   EKG: 03/09/2022: NSR, 75 bpm, without underlying ischemia or injury pattern.  Echocardiogram: 12/13/2020: Normal LV systolic function with visual EF 50-55%. Left ventricle cavity is normal in size. Mild left ventricular hypertrophy. Normal global wall motion. Indeterminate diastolic filling pattern, normal LAP. Left atrial cavity is severely dilated. Mild (Grade I) mitral regurgitation. Mild tricuspid regurgitation. No evidence of pulmonary hypertension. Mild pulmonic regurgitation. IVC is dilated with a respiratory response of >50%. No prior study for comparison.    Stress Testing: Exercise Tetrofosmin stress test  05/31/2021: Exercise nuclear stress test was performed using Bruce protocol. Patient reached 7 METS, and 86% of age predicted maximum heart rate. Exercise capacity was low. Non-limiting chest pain reported. Heart rate and hemodynamic response were normal. Stress EKG revealed no ischemic changes. Normal myocardial perfusion. Stress LVEF 54%. Low risk study. Non-limiting chest pain did not correlate with EKG or SPECT evidence of ischemia.   Coronary CTA 12/14/2020: 1. Coronary calcium score of 24.6 AU. This was 70th percentile for age and sex matched control. 2. Normal coronary origin with right dominance. 3. CAD-RADS = 4. Severe stenosis (70-99%) at the proximal /mid LAD due to mixed plaque. First diagonal branch patent but diffuse disease within the ostial to proximal segment. Second diagonal branch is overall patent. Diffuse disease within the proximal to mid segment LCx. RCA without evidence of plaque or stenosis. 4. Study is sent for CT-FFR to further evaluate the LAD, D1, and LCx. Findings will be performed and reported separately.  Noncardiac impressions: 1. No acute findings in the imaged extracardiac chest. 2. Moderate hiatal hernia. 3. Mild hepatic steatosis. 4. Aortic Atherosclerosis (ICD10-I70.0).  Coronary CT FFR 12/15/2020: CT FFR analysis showed significant stenosis at mid LAD (modeled as total occlusion), mid-to-distal LCX, and acute marginal modeled as total occlusion.  Heart Catheterization: 12/21/2020: LM: Normal LAD: Mid 100% occlusion after Diag 2         Collaterals to distal LAD from Diag 2 and RPDA         High Diag 1 patent         Diag 2 diffuse 60-70% disease LCx: Proximal tandem 75% stenosis        Undefilled and sub-totally occluded OM1        Mid LCx 7% stenosis RCA: Mild luminal irregularities   Attempted wiring of LAD to assess chronicity of LAD occlusion. Based on wire behavior, appears to be chronic   Continue optimal medical management for now.  Fortunately, he does not have any LM involvement and EF is normal. Any revascularization recommendation will be for symptomatic improvement.  In addition to optimal medical therapy, following are the options for revascularization   #1. CABG to distal LAD, ?diag2, OM1-if graftable #2. PCI to LCx +/- OM1, stage LAD CTO PCI   CABG more likely to achieve complete revascularization with less procedures. Will continue outpatient discussion re: optimal revascularization  14 day extended Holter monitor: Dominant rhythm normal sinus. Heart rate 40-133 bpm. Avg HR 66 bpm. No atrial fibrillation, supraventricular or ventricular tachycardia, high grade AV block, pauses (3 seconds or longer). Total ventricular ectopic burden <1%. Total supraventricular ectopic burden <1%. Patient triggered events: 39. Either normal sinus rhythm or sinus tachycardia with rare PACs/PVCs.  LABORATORY DATA:    Latest Ref Rng & Units 11/26/2021  10:24 AM 10/23/2021    3:27 PM 01/14/2021    3:26 AM  CBC  WBC 3.4 - 10.8 x10E3/uL 5.6  4.5  6.2   Hemoglobin 13.0 - 17.7 g/dL 14.2  13.3  10.2   Hematocrit 37.5 - 51.0 % 42.9  40.0  30.3   Platelets 150 - 450 x10E3/uL 158  117.0 Repeated and verified X2.  120        Latest Ref Rng & Units 01/30/2022   10:31 AM 11/26/2021   10:24 AM 10/23/2021    3:27 PM  CMP  Glucose 70 - 99 mg/dL 115  103  106   BUN 6 - 23 mg/dL _0 Creatinine 0.40 - 1.50 mg/dL 1.03  1.17  1.13   Sodium 135 - 145 mEq/L 137  134  135   Potassium 3.5 - 5.1 mEq/L 4.5  5.1  4.5   Chloride 96 - 112 mEq/L 103  98  102   CO2 19 - 32 mEq/L _1 Calcium 8.4 - 10.5 mg/dL 9.7  10.6  9.8   Total Protein 6.0 - 8.3 g/dL 7.9  8.2  8.1   Total Bilirubin 0.2 - 1.2 mg/dL 0.5  0.4  0.5   Alkaline Phos 39 - 117 U/L 69  88  73   AST 0 - 37 U/L _2 ALT 0 - 53 U/L _3 Lipid Panel  Lab Results  Component Value Date   CHOL 97 (L) 02/21/2021   HDL 31 (L) 02/21/2021   LDLCALC 47  02/21/2021   LDLDIRECT 52 02/21/2021   TRIG 98 02/21/2021   CHOLHDL 4 07/02/2018   BMP Recent Labs    10/23/21 1527 11/26/21 1024 01/30/22 1031  NA 135 134 137  K 4.5 5.1 4.5  CL 102 98 103  CO2 _4 GLUCOSE 106* 103* 115*  BUN _5 CREATININE 1.13 1.17 1.03  CALCIUM 9.8 10.6* 9.7   HEMOGLOBIN A1C Lab Results  Component Value Date   HGBA1C 5.3 01/06/2021   MPG 105.41 01/06/2021   External Labs: Collected: 11/09/2020, received from PCP Hemoglobin 15.8 g/dL, hematocrit 48% Platelets 143 Creatinine 0.93 mg/dL. eGFR: 103 mL/min per 1.73 m Potassium 4.9 AST 23, ALT 40, alkaline phosphatase 108 Lipid profile: Total cholesterol 191, triglycerides 91, HDL 47, LDL 125, non-HDL 144 Hemoglobin A1c: 5.3 TSH: 1.07  IMPRESSION:    ICD-10-CM   1. Coronary artery disease involving native coronary artery of native heart with angina pectoris ([place])  I25.119 EKG 12-Lead    2. Hx of CABG  Z95.1     3. Atherosclerosis of aorta (HCC)  I70.0     4. Essential hypertension  I10     5. Mixed hyperlipidemia  E78.2     6. Former smoker  Z87.891        RECOMMENDATIONS: Tevita Gomer is a 61 y.o. 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  Coronary artery disease involving native coronary artery of native heart with angina pectoris [facility]) S/p four-vessel bypass 01/10/2021 and mediastinal exploration 01/11/2021. Denies angina pectoris. No use of sublingual nitroglycerin tablets since last office visit. EKG: Nonischemic. Recently has undergone MPI as well as Holter  monitor in the recent past results reviewed. Educated on the importance of secondary prevention. Unfortunately due to dietary indiscretion and reduced physical activity he has gained  approximately 21 pounds over the last 6 months.  Patient is educated on the importance of portion control, increasing physical activity as tolerated with a goal of 30 minutes a day 5 days a week.  Hx of CABG (CABG X 4.  LIMA-LAD, Left radial artery - OM2 (proximal of diagonal vein graft), RSVG D1, and D2). See above. Secondary prevention.  Palpitations: Overall improving. No identifiable reversible cause. Currently being treated for heartburn/H. pylori. Monitor for now.  Atherosclerosis of aorta (HCC) Continue statin therapy, LDL currently at goal.  Essential hypertension Office blood pressures well controlled. Continue current medical therapy. Low-salt diet recommended.  Mixed hyperlipidemia Continue atorvastatin. Most recent LDL <70 mg/dL.  Former smoker Educated on the importance of continued smoking cessation.  FINAL MEDICATION LIST END OF ENCOUNTER: No orders of the defined types were placed in this encounter.    Current Outpatient Medications:    amLODipine (NORVASC) 5 MG tablet, TAKE 1 TABLET (5 MG TOTAL) BY MOUTH DAILY., Disp: 90 tablet, Rfl: 1   atorvastatin (LIPITOR) 40 MG tablet, Take 1 tablet (40 mg total) by mouth at bedtime., Disp: 90 tablet, Rfl: 1   clopidogrel (PLAVIX) 75 MG tablet, TAKE 1 TABLET BY MOUTH EVERY DAY, Disp: 90 tablet, Rfl: 3   [employer]S ASPIRIN LOW DOSE 81 MG EC tablet, TAKE 1 TABLET (81 MG TOTAL) BY MOUTH DAILY. SWALLOW WHOLE., Disp: 90 tablet, Rfl: 3   famotidine (PEPCID) 40 MG tablet, Take 1 tablet (40 mg total) by mouth at bedtime., Disp: 30 tablet, Rfl: 2   finasteride (PROSCAR) 5 MG tablet, Take 1 tablet (5 mg total) by mouth daily., Disp: 90 tablet, Rfl: 1   metoprolol succinate (TOPROL-XL) 25 MG 24 hr tablet, TAKE 1 TABLET BY MOUTH EVERY DAY IN THE MORNING, Disp: 90 tablet, Rfl: 0   pantoprazole (PROTONIX) 40 MG tablet, Take 40 mg by mouth 2 (two) times daily., Disp: , Rfl:    tamsulosin (FLOMAX) 0.4 MG CAPS capsule, Take 1 capsule (0.4 mg  total) by mouth daily., Disp: 30 capsule, Rfl: 2   nitroGLYCERIN (NITROSTAT) 0.4 MG SL tablet, PLACE 1 TABLET (0.4 MG TOTAL) UNDER THE TONGUE EVERY 5 (FIVE) MINUTES AS NEEDED FOR CHEST PAIN. IF YOU REQUIRE MORE THAN TWO TABLETS FIVE MINUTES APART GO TO THE NEAREST ER VIA EMS., Disp: 25 tablet, Rfl: 0   sucralfate (CARAFATE) 1 g tablet, Take 1 tablet (1 g total) by mouth 4 (four) times daily -  with meals and at bedtime for 10 days., Disp: 40 tablet, Rfl: 0  Current Facility-Administered Medications:    0.9 %  sodium chloride infusion, 500 mL, Intravenous, Once, Milus Banister, MD  Orders Placed This Encounter  Procedures   EKG 12-Lead    There are no Patient Instructions on file for this visit.   --Continue cardiac medications as reconciled in final medication list. --Return in about 16 months (around 06/27/2023) for Follow up, CAD. Or sooner if needed. --Continue follow-up with your primary care physician regarding the management of your other chronic comorbid conditions.  Patient's questions and concerns were addressed to his satisfaction. He voices understanding of the instructions provided during this encounter.   This note was created using a voice recognition software as a result there may be grammatical errors inadvertently enclosed that do not reflect the nature of this encounter. Every attempt is  made to correct such errors.  Total time spent: 32 minutes  Mechele Claude [facility]  Pager: [phone] Office: [phone]

## 2022-03-14 NOTE — Telephone Encounter (Signed)
If we could please send referral to [facility]  [phone]. Thanks!

## 2022-03-20 ENCOUNTER — Encounter: Payer: 59 | Admitting: Emergency Medicine

## 2022-03-29 ENCOUNTER — Ambulatory Visit (INDEPENDENT_AMBULATORY_CARE_PROVIDER_SITE_OTHER): Payer: 59 | Admitting: Emergency Medicine

## 2022-03-29 ENCOUNTER — Encounter: Payer: Self-pay | Admitting: Emergency Medicine

## 2022-03-29 VITALS — BP 116/70 | HR 69 | Temp 98.2°F | Ht 67.0 in | Wt 203.2 lb

## 2022-03-29 DIAGNOSIS — Z0000 Encounter for general adult medical examination without abnormal findings: Secondary | ICD-10-CM | POA: Diagnosis not present

## 2022-03-29 DIAGNOSIS — Z1329 Encounter for screening for other suspected endocrine disorder: Secondary | ICD-10-CM

## 2022-03-29 DIAGNOSIS — Z13228 Encounter for screening for other metabolic disorders: Secondary | ICD-10-CM | POA: Diagnosis not present

## 2022-03-29 DIAGNOSIS — K2100 Gastro-esophageal reflux disease with esophagitis, without bleeding: Secondary | ICD-10-CM | POA: Diagnosis not present

## 2022-03-29 DIAGNOSIS — Z125 Encounter for screening for malignant neoplasm of prostate: Secondary | ICD-10-CM

## 2022-03-29 DIAGNOSIS — Z13220 Encounter for screening for lipoid disorders: Secondary | ICD-10-CM

## 2022-03-29 DIAGNOSIS — Z130 Encounter for screening for diseases of the blood and blood-forming organs and certain disorders involving the immune mechanism: Secondary | ICD-10-CM

## 2022-03-29 LAB — CBC WITH DIFFERENTIAL/PLATELET
Basophils Absolute: 0 10*3/uL (ref 0.0–0.1)
Basophils Relative: 0.9 % (ref 0.0–3.0)
Eosinophils Absolute: 0.5 10*3/uL (ref 0.0–0.7)
Eosinophils Relative: 8.8 % — ABNORMAL HIGH (ref 0.0–5.0)
HCT: 40.5 % (ref 39.0–52.0)
Hemoglobin: 13.4 g/dL (ref 13.0–17.0)
Lymphocytes Relative: 32.9 % (ref 12.0–46.0)
Lymphs Abs: 1.8 10*3/uL (ref 0.7–4.0)
MCHC: 33 g/dL (ref 30.0–36.0)
MCV: 80 fl (ref 78.0–100.0)
Monocytes Absolute: 0.6 10*3/uL (ref 0.1–1.0)
Monocytes Relative: 10.8 % (ref 3.0–12.0)
Neutro Abs: 2.5 10*3/uL (ref 1.4–7.7)
Neutrophils Relative %: 46.6 % (ref 43.0–77.0)
Platelets: 115 10*3/uL — ABNORMAL LOW (ref 150.0–400.0)
RBC: 5.07 Mil/uL (ref 4.22–5.81)
RDW: 15.7 % — ABNORMAL HIGH (ref 11.5–15.5)
WBC: 5.4 10*3/uL (ref 4.0–10.5)

## 2022-03-29 LAB — LIPID PANEL
Cholesterol: 132 mg/dL (ref 0–200)
HDL: 35.7 mg/dL — ABNORMAL LOW (ref >39.00)
LDL Cholesterol: 59 mg/dL (ref 0–99)
NonHDL: 96.26
Total CHOL/HDL Ratio: 4
Triglycerides: 184 mg/dL — ABNORMAL HIGH (ref 0.0–149.0)
VLDL: 36.8 mg/dL (ref 0.0–40.0)

## 2022-03-29 LAB — URINALYSIS
Bilirubin Urine: NEGATIVE
Hgb urine dipstick: NEGATIVE
Ketones, ur: NEGATIVE
Leukocytes,Ua: NEGATIVE
Nitrite: NEGATIVE
Specific Gravity, Urine: 1.02 (ref 1.000–1.030)
Total Protein, Urine: NEGATIVE
Urine Glucose: NEGATIVE
Urobilinogen, UA: 0.2 (ref 0.0–1.0)
pH: 6 (ref 5.0–8.0)

## 2022-03-29 LAB — COMPREHENSIVE METABOLIC PANEL
ALT: 16 U/L (ref 0–53)
AST: 19 U/L (ref 0–37)
Albumin: 4.2 g/dL (ref 3.5–5.2)
Alkaline Phosphatase: 66 U/L (ref 39–117)
BUN: 18 mg/dL (ref 6–23)
CO2: 28 mEq/L (ref 19–32)
Calcium: 9.7 mg/dL (ref 8.4–10.5)
Chloride: 102 mEq/L (ref 96–112)
Creatinine, Ser: 1.13 mg/dL (ref 0.40–1.50)
GFR: 70.15 mL/min (ref >60.00)
Glucose, Bld: 103 mg/dL — ABNORMAL HIGH (ref 70–99)
Potassium: 4.6 mEq/L (ref 3.5–5.1)
Sodium: 135 mEq/L (ref 135–145)
Total Bilirubin: 0.5 mg/dL (ref 0.2–1.2)
Total Protein: 7.9 g/dL (ref 6.0–8.3)

## 2022-03-29 LAB — HEMOGLOBIN A1C: Hgb A1c MFr Bld: 5.7 % (ref 4.6–6.5)

## 2022-03-29 LAB — PSA: PSA: 0.31 ng/mL (ref 0.10–4.00)

## 2022-03-29 MED ORDER — FAMOTIDINE 40 MG PO TABS: 40.0000 mg | ORAL_TABLET | Freq: Every day | ORAL | 2 refills | Status: DC

## 2022-03-29 NOTE — Progress Notes (Signed)
Eugene Mccullough 61 y.o.   Chief Complaint  Patient presents with   Annual Exam    HISTORY OF PRESENT ILLNESS: This is a 61 y.o. male here for annual exam. No complaints or medical concerns today. Recent cardiology evaluation last month. Office visit notes reviewed with patient.  HPI   Prior to Admission medications   Medication Sig Start Date End Date Taking? Authorizing Provider  amLODipine (NORVASC) 5 MG tablet TAKE 1 TABLET (5 MG TOTAL) BY MOUTH DAILY. 11/09/21   Tolia, Sunit, DO  atorvastatin (LIPITOR) 40 MG tablet Take 1 tablet (40 mg total) by mouth at bedtime. 08/14/21   Tolia, Sunit, DO  clopidogrel (PLAVIX) 75 MG tablet TAKE 1 TABLET BY MOUTH EVERY DAY 12/06/21   Tolia, Sunit, DO  [facility] ASPIRIN LOW DOSE 81 MG EC tablet TAKE 1 TABLET (81 MG TOTAL) BY MOUTH DAILY. SWALLOW WHOLE. 01/08/22   Tolia, Sunit, DO  famotidine (PEPCID) 40 MG tablet Take 1 tablet (40 mg total) by mouth at bedtime. 10/23/21 03/09/22  Vladimir Crofts, PA-C  finasteride (PROSCAR) 5 MG tablet Take 1 tablet (5 mg total) by mouth daily. 12/25/21   Horald Pollen, MD  metoprolol succinate (TOPROL-XL) 25 MG 24 hr tablet TAKE 1 TABLET BY MOUTH EVERY DAY IN THE MORNING 01/08/22   Tolia, Sunit, DO  nitroGLYCERIN (NITROSTAT) 0.4 MG SL tablet PLACE 1 TABLET (0.4 MG TOTAL) UNDER THE TONGUE EVERY 5 (FIVE) MINUTES AS NEEDED FOR CHEST PAIN. IF YOU REQUIRE MORE THAN TWO TABLETS FIVE MINUTES APART GO TO THE NEAREST ER VIA EMS. 01/16/21 10/03/21  Adrian Prows, MD  pantoprazole (PROTONIX) 40 MG tablet Take 40 mg by mouth 2 (two) times daily.    [provider]  sucralfate (CARAFATE) 1 g tablet Take 1 tablet (1 g total) by mouth 4 (four) times daily -  with meals and at bedtime for 10 days. 10/23/21 03/06/22  Vladimir Crofts, PA-C  tamsulosin (FLOMAX) 0.4 MG CAPS capsule Take 1 capsule (0.4 mg total) by mouth daily. 02/28/22   Horald Pollen, MD    Allergies  Allergen Reactions   Hydrocodone-Acetaminophen Itching    Oxycodone Itching    Patient Active Problem List   Diagnosis Date Noted   History of coronary artery disease 10/03/2021   Hx of CABG 01/10/2021   Essential hypertension 01/06/2021   Mixed hyperlipidemia 01/06/2021   Anemia    BPH (benign prostatic hyperplasia)    Eczema    GERD (gastroesophageal reflux disease)    Hyperlipidemia    IBS (irritable bowel syndrome)    Sleep apnea    CAD (coronary artery disease) 12/20/2020   Precordial pain    DDD (degenerative disc disease), lumbar 08/15/2018   Pes cavus 08/15/2018   History of IBS 08/15/2018   Generalized abdominal tenderness without rebound tenderness 08/14/2018   Borderline results on serologic testing for Lyme disease 07/05/2018   Arthralgia 07/02/2018   Lumbar radiculopathy 09/12/2017   PUD (peptic ulcer disease) 10/28/2014   Allergic rhinitis, cause unspecified 01/15/2014   Dermatitis, atopic 05/05/2013   Obstructive sleep apnea 02/02/2013   BPH with obstruction/lower urinary tract symptoms 01/02/2013   Hypertension 08/15/2011   Obesity (BMI 30-39.9)     Past Medical History:  Diagnosis Date   Anemia    Anginal pain (HCC)    Arthritis    BPH (benign prostatic hyperplasia)    Coronary artery disease    Eczema    Gastric ulcer    GERD (gastroesophageal reflux disease)  Gout    H. pylori infection    Hyperlipidemia    Hypertension    IBS (irritable bowel syndrome)    Pre-diabetes    Sleep apnea    Does not use CPAP    Past Surgical History:  Procedure Laterality Date   CARDIAC CATHETERIZATION     2022   CHOLECYSTECTOMY  2005   COLONOSCOPY  08/2010   CORONARY ARTERY BYPASS GRAFT N/A 01/10/2021   Procedure: CORONARY ARTERY BYPASS GRAFTING (CABG) X  FOUR ON PUMP USING LEFT INTERNAL MAMMARY ARTERY, LEFT RADIAL ARTERY, RIGHT ENDOSCOPIC GREATER SAPHENOUS VEIN HARVEST CONDUITS;  Surgeon: Lajuana Matte, MD;  Location: [place];  Service: Open Heart Surgery;  Laterality: N/A;   EXPLORATION POST OPERATIVE  OPEN HEART N/A 01/11/2021   Procedure: , MEDIASTINAL WASHOUT;  Surgeon: Lajuana Matte, MD;  Location: [place];  Service: Open Heart Surgery;  Laterality: N/A;   LEFT HEART CATH AND CORONARY ANGIOGRAPHY N/A 12/21/2020   Procedure: LEFT HEART CATH AND CORONARY ANGIOGRAPHY;  Surgeon: Nigel Mormon, MD;  Location: [facility];  Service: Cardiovascular;  Laterality: N/A;   RADIAL ARTERY HARVEST Left 01/10/2021   Procedure: RADIAL ARTERY HARVEST;  Surgeon: Lajuana Matte, MD;  Location: [place];  Service: Open Heart Surgery;  Laterality: Left;   TEE WITHOUT CARDIOVERSION N/A 01/10/2021   Procedure: TRANSESOPHAGEAL ECHOCARDIOGRAM (TEE);  Surgeon: Lajuana Matte, MD;  Location: [facility];  Service: Open Heart Surgery;  Laterality: N/A;   UPPER GI ENDOSCOPY  2019   WISDOM TOOTH EXTRACTION      Social History   Socioeconomic History   Marital status: Married    Spouse name: Not on file   Number of children: 3   Years of education: 27   Highest education level: Not on file  Occupational History   Occupation: driver  Tobacco Use   Smoking status: Former    Packs/day: 1.00    Years: 20.00    Total pack years: 20.00    Types: Cigarettes    Quit date: 2014    Years since quitting: 9.5   Smokeless tobacco: Never  Vaping Use   Vaping Use: Never used  Substance and Sexual Activity   Alcohol use: No    Alcohol/week: 0.0 standard drinks of alcohol   Drug use: Never   Sexual activity: Yes  Other Topics Concern   Not on file  Social History Narrative   Not on file   Social Determinants of Health   Financial Resource Strain: Not on file  Food Insecurity: Not on file  Transportation Needs: Not on file  Physical Activity: Not on file  Stress: Not on file  Social Connections: Not on file  Intimate Partner Violence: Not on file    Family History  Problem Relation Age of Onset   Diabetes Brother    Hyperlipidemia Mother    Obesity Sister    Hypertension Sister     Diabetes Sister    Eating disorder Daughter    Eczema Son    Irritable bowel syndrome Son    Cancer Neg Hx    Early death Neg Hx    Heart disease Neg Hx    Kidney disease Neg Hx    Stroke Neg Hx    Alcohol abuse Neg Hx    Colon cancer Neg Hx    Esophageal cancer Neg Hx    Stomach cancer Neg Hx    Rectal cancer Neg Hx      Review of  Systems  Constitutional: Negative.  Negative for chills and fever.  HENT: Negative.  Negative for congestion and sore throat.   Respiratory: Negative.  Negative for cough and shortness of breath.   Cardiovascular: Negative.  Negative for chest pain and palpitations.  Gastrointestinal:  Negative for abdominal pain, diarrhea, nausea and vomiting.  Genitourinary: Negative.  Negative for dysuria.  Skin: Negative.  Negative for rash.  Neurological:  Negative for dizziness and headaches.  All other systems reviewed and are negative.  Today's Vitals   03/29/22 1302  BP: 116/70  Pulse: 69  Temp: 98.2 F (36.8 C)  TempSrc: Oral  SpO2: 99%  Weight: 203 lb 4 oz (92.2 kg)  Height: '5\' 7"'$  (1.702 m)   Body mass index is 31.83 kg/m.   Physical Exam Vitals reviewed.  Constitutional:      Appearance: Normal appearance.  HENT:     Head: Normocephalic.     Right Ear: Tympanic membrane, ear canal and external ear normal.     Left Ear: Tympanic membrane, ear canal and external ear normal.     Mouth/Throat:     Mouth: Mucous membranes are moist.     Pharynx: Oropharynx is clear.     Comments: Hyperpigmented areas on his tongue.  Chronic. Eyes:     Extraocular Movements: Extraocular movements intact.     Conjunctiva/sclera: Conjunctivae normal.     Pupils: Pupils are equal, round, and reactive to light.  Cardiovascular:     Rate and Rhythm: Normal rate and regular rhythm.     Pulses: Normal pulses.     Heart sounds: Normal heart sounds.  Pulmonary:     Effort: Pulmonary effort is normal.     Breath sounds: Normal breath sounds.  Abdominal:      Palpations: Abdomen is soft.     Tenderness: There is no abdominal tenderness.  Musculoskeletal:     Cervical back: No tenderness.     Right lower leg: No edema.     Left lower leg: No edema.  Lymphadenopathy:     Cervical: No cervical adenopathy.  Skin:    General: Skin is warm and dry.     Capillary Refill: Capillary refill takes less than 2 seconds.  Neurological:     General: No focal deficit present.     Mental Status: He is alert and oriented to person, place, and time.  Psychiatric:        Mood and Affect: Mood normal.        Behavior: Behavior normal.      ASSESSMENT & PLAN: Problem List Items Addressed This Visit       Digestive   GERD (gastroesophageal reflux disease)   Relevant Medications   famotidine (PEPCID) 40 MG tablet   Other Visit Diagnoses     Routine general medical examination at a health care facility    -  Primary   Prostate cancer screening       Relevant Orders   PSA(Must document that pt has been informed of limitations of PSA testing.)   Screening for deficiency anemia       Relevant Orders   CBC with Differential   Screening for lipoid disorders       Relevant Orders   Lipid panel   Screening for endocrine, metabolic and immunity disorder       Relevant Orders   Comprehensive metabolic panel   Hemoglobin A1c   Urinalysis      Modifiable risk factors discussed with patient. Anticipatory guidance according  to age provided. The following topics were also discussed: Social Determinants of Health Smoking.  Former smoker Diet and nutrition and need to decrease amount of daily carbohydrate intake and daily calories Benefits of exercise Cancer screening recommendations and review of most recent colonoscopy report Vaccinations recommendations Cardiovascular risk assessment and review of most recent cardiologist office visit notes Mental health including depression and anxiety Fall and accident prevention  Patient Instructions  Health  Maintenance, Male Adopting a healthy lifestyle and getting preventive care are important in promoting health and wellness. Ask your health care provider about: The right schedule for you to have regular tests and exams. Things you can do on your own to prevent diseases and keep yourself healthy. What should I know about diet, weight, and exercise? Eat a healthy diet  Eat a diet that includes plenty of vegetables, fruits, low-fat dairy products, and lean protein. Do not eat a lot of foods that are high in solid fats, added sugars, or sodium. Maintain a healthy weight Body mass index (BMI) is a measurement that can be used to identify possible weight problems. It estimates body fat based on height and weight. Your health care provider can help determine your BMI and help you achieve or maintain a healthy weight. Get regular exercise Get regular exercise. This is one of the most important things you can do for your health. Most adults should: Exercise for at least 150 minutes each week. The exercise should increase your heart rate and make you sweat (moderate-intensity exercise). Do strengthening exercises at least twice a week. This is in addition to the moderate-intensity exercise. Spend less time sitting. Even light physical activity can be beneficial. Watch cholesterol and blood lipids Have your blood tested for lipids and cholesterol at 61 years of age, then have this test every 5 years. You may need to have your cholesterol levels checked more often if: Your lipid or cholesterol levels are high. You are older than 61 years of age. You are at high risk for heart disease. What should I know about cancer screening? Many types of cancers can be detected early and may often be prevented. Depending on your health history and family history, you may need to have cancer screening at various ages. This may include screening for: Colorectal cancer. Prostate cancer. Skin cancer. Lung cancer. What  should I know about heart disease, diabetes, and high blood pressure? Blood pressure and heart disease High blood pressure causes heart disease and increases the risk of stroke. This is more likely to develop in people who have high blood pressure readings or are overweight. Talk with your health care provider about your target blood pressure readings. Have your blood pressure checked: Every 3-5 years if you are 2-34 years of age. Every year if you are 12 years old or older. If you are between the ages of 67 and 74 and are a current or former smoker, ask your health care provider if you should have a one-time screening for abdominal aortic aneurysm (AAA). Diabetes Have regular diabetes screenings. This checks your fasting blood sugar level. Have the screening done: Once every three years after age 3 if you are at a normal weight and have a low risk for diabetes. More often and at a younger age if you are overweight or have a high risk for diabetes. What should I know about preventing infection? Hepatitis B If you have a higher risk for hepatitis B, you should be screened for this virus. Talk with your health  care provider to find out if you are at risk for hepatitis B infection. Hepatitis C Blood testing is recommended for: Everyone born from [birth year] through 1965. Anyone with known risk factors for hepatitis C. Sexually transmitted infections (STIs) You should be screened each year for STIs, including gonorrhea and chlamydia, if: You are sexually active and are younger than 61 years of age. You are older than 61 years of age and your health care provider tells you that you are at risk for this type of infection. Your sexual activity has changed since you were last screened, and you are at increased risk for chlamydia or gonorrhea. Ask your health care provider if you are at risk. Ask your health care provider about whether you are at high risk for HIV. Your health care provider may recommend a  prescription medicine to help prevent HIV infection. If you choose to take medicine to prevent HIV, you should first get tested for HIV. You should then be tested every 3 months for as long as you are taking the medicine. Follow these instructions at home: Alcohol use Do not drink alcohol if your health care provider tells you not to drink. If you drink alcohol: Limit how much you have to 0-2 drinks a day. Know how much alcohol is in your drink. In the [place] one drink equals one 12 oz bottle of beer (355 mL), one 5 oz glass of wine (148 mL), or one 1 oz glass of hard liquor (44 mL). Lifestyle Do not use any products that contain nicotine or tobacco. These products include cigarettes, chewing tobacco, and vaping devices, such as e-cigarettes. If you need help quitting, ask your health care provider. Do not use street drugs. Do not share needles. Ask your health care provider for help if you need support or information about quitting drugs. General instructions Schedule regular health, dental, and eye exams. Stay current with your vaccines. Tell your health care provider if: You often feel depressed. You have ever been abused or do not feel safe at home. Summary Adopting a healthy lifestyle and getting preventive care are important in promoting health and wellness. Follow your health care provider's instructions about healthy diet, exercising, and getting tested or screened for diseases. Follow your health care provider's instructions on monitoring your cholesterol and blood pressure. This information is not intended to replace advice given to you by your health care provider. Make sure you discuss any questions you have with your health care provider. Document Revised: 01/02/2021 Document Reviewed: 01/02/2021 Elsevier Patient Education  [place], MD [facility]

## 2022-03-29 NOTE — Patient Instructions (Signed)
Health Maintenance, Male Adopting a healthy lifestyle and getting preventive care are important in promoting health and wellness. Ask your health care provider about: The right schedule for you to have regular tests and exams. Things you can do on your own to prevent diseases and keep yourself healthy. What should I know about diet, weight, and exercise? Eat a healthy diet  Eat a diet that includes plenty of vegetables, fruits, low-fat dairy products, and lean protein. Do not eat a lot of foods that are high in solid fats, added sugars, or sodium. Maintain a healthy weight Body mass index (BMI) is a measurement that can be used to identify possible weight problems. It estimates body fat based on height and weight. Your health care provider can help determine your BMI and help you achieve or maintain a healthy weight. Get regular exercise Get regular exercise. This is one of the most important things you can do for your health. Most adults should: Exercise for at least 150 minutes each week. The exercise should increase your heart rate and make you sweat (moderate-intensity exercise). Do strengthening exercises at least twice a week. This is in addition to the moderate-intensity exercise. Spend less time sitting. Even light physical activity can be beneficial. Watch cholesterol and blood lipids Have your blood tested for lipids and cholesterol at 61 years of age, then have this test every 5 years. You may need to have your cholesterol levels checked more often if: Your lipid or cholesterol levels are high. You are older than 61 years of age. You are at high risk for heart disease. What should I know about cancer screening? Many types of cancers can be detected early and may often be prevented. Depending on your health history and family history, you may need to have cancer screening at various ages. This may include screening for: Colorectal cancer. Prostate cancer. Skin cancer. Lung  cancer. What should I know about heart disease, diabetes, and high blood pressure? Blood pressure and heart disease High blood pressure causes heart disease and increases the risk of stroke. This is more likely to develop in people who have high blood pressure readings or are overweight. Talk with your health care provider about your target blood pressure readings. Have your blood pressure checked: Every 3-5 years if you are 18-39 years of age. Every year if you are 40 years old or older. If you are between the ages of 65 and 75 and are a current or former smoker, ask your health care provider if you should have a one-time screening for abdominal aortic aneurysm (AAA). Diabetes Have regular diabetes screenings. This checks your fasting blood sugar level. Have the screening done: Once every three years after age 45 if you are at a normal weight and have a low risk for diabetes. More often and at a younger age if you are overweight or have a high risk for diabetes. What should I know about preventing infection? Hepatitis B If you have a higher risk for hepatitis B, you should be screened for this virus. Talk with your health care provider to find out if you are at risk for hepatitis B infection. Hepatitis C Blood testing is recommended for: Everyone born from 1945 through 1965. Anyone with known risk factors for hepatitis C. Sexually transmitted infections (STIs) You should be screened each year for STIs, including gonorrhea and chlamydia, if: You are sexually active and are younger than 61 years of age. You are older than 61 years of age and your   health care provider tells you that you are at risk for this type of infection. Your sexual activity has changed since you were last screened, and you are at increased risk for chlamydia or gonorrhea. Ask your health care provider if you are at risk. Ask your health care provider about whether you are at high risk for HIV. Your health care provider  may recommend a prescription medicine to help prevent HIV infection. If you choose to take medicine to prevent HIV, you should first get tested for HIV. You should then be tested every 3 months for as long as you are taking the medicine. Follow these instructions at home: Alcohol use Do not drink alcohol if your health care provider tells you not to drink. If you drink alcohol: Limit how much you have to 0-2 drinks a day. Know how much alcohol is in your drink. In the [place] one drink equals one 12 oz bottle of beer (355 mL), one 5 oz glass of wine (148 mL), or one 1 oz glass of hard liquor (44 mL). Lifestyle Do not use any products that contain nicotine or tobacco. These products include cigarettes, chewing tobacco, and vaping devices, such as e-cigarettes. If you need help quitting, ask your health care provider. Do not use street drugs. Do not share needles. Ask your health care provider for help if you need support or information about quitting drugs. General instructions Schedule regular health, dental, and eye exams. Stay current with your vaccines. Tell your health care provider if: You often feel depressed. You have ever been abused or do not feel safe at home. Summary Adopting a healthy lifestyle and getting preventive care are important in promoting health and wellness. Follow your health care provider's instructions about healthy diet, exercising, and getting tested or screened for diseases. Follow your health care provider's instructions on monitoring your cholesterol and blood pressure. This information is not intended to replace advice given to you by your health care provider. Make sure you discuss any questions you have with your health care provider. Document Revised: 01/02/2021 Document Reviewed: 01/02/2021 Elsevier Patient Education  2023 [employer]  

## 2022-04-01 ENCOUNTER — Encounter: Payer: Self-pay | Admitting: Emergency Medicine

## 2022-04-01 ENCOUNTER — Encounter: Payer: Self-pay | Admitting: Cardiology

## 2022-04-02 NOTE — Telephone Encounter (Signed)
Called and spoke to patient he voiced understanding

## 2022-04-02 NOTE — Telephone Encounter (Signed)
From patient.

## 2022-04-05 ENCOUNTER — Ambulatory Visit: Payer: Commercial Managed Care - HMO | Admitting: Urology

## 2022-04-05 ENCOUNTER — Encounter: Payer: Self-pay | Admitting: Urology

## 2022-04-05 VITALS — BP 131/86 | HR 64 | Ht 67.0 in | Wt 195.0 lb

## 2022-04-05 DIAGNOSIS — R399 Unspecified symptoms and signs involving the genitourinary system: Secondary | ICD-10-CM

## 2022-04-05 DIAGNOSIS — Z125 Encounter for screening for malignant neoplasm of prostate: Secondary | ICD-10-CM

## 2022-04-05 DIAGNOSIS — N138 Other obstructive and reflux uropathy: Secondary | ICD-10-CM | POA: Diagnosis not present

## 2022-04-05 DIAGNOSIS — R350 Frequency of micturition: Secondary | ICD-10-CM

## 2022-04-05 DIAGNOSIS — N401 Enlarged prostate with lower urinary tract symptoms: Secondary | ICD-10-CM

## 2022-04-05 DIAGNOSIS — R3912 Poor urinary stream: Secondary | ICD-10-CM

## 2022-04-05 DIAGNOSIS — R3915 Urgency of urination: Secondary | ICD-10-CM

## 2022-04-05 DIAGNOSIS — R300 Dysuria: Secondary | ICD-10-CM

## 2022-04-05 LAB — URINALYSIS, COMPLETE
Bilirubin, UA: NEGATIVE
Glucose, UA: NEGATIVE
Ketones, UA: NEGATIVE
Leukocytes,UA: NEGATIVE
Nitrite, UA: NEGATIVE
Protein,UA: NEGATIVE
RBC, UA: NEGATIVE
Specific Gravity, UA: <1.005 — ABNORMAL LOW (ref 1.005–1.030)
Urobilinogen, Ur: 0.2 mg/dL (ref 0.2–1.0)
pH, UA: 5.5 (ref 5.0–7.5)

## 2022-04-05 LAB — BLADDER SCAN AMB NON-IMAGING

## 2022-04-05 LAB — MICROSCOPIC EXAMINATION
Bacteria, UA: NONE SEEN
RBC, Urine: NONE SEEN /hpf (ref 0–2)

## 2022-04-05 MED ORDER — TAMSULOSIN HCL 0.4 MG PO CAPS: 0.4000 mg | ORAL_CAPSULE | Freq: Every day | ORAL | 11 refills | Status: AC

## 2022-04-05 NOTE — Progress Notes (Signed)
04/05/22 3:10 PM   Eugene Mccullough [birth year] [id number]  CC: Lower urinary tract symptoms, PSA screening  HPI: I saw Mr. Eugene Mccullough today for the above issues.  His primary complaints are urinary frequency, some urgency, occasional dysuria, and some weak urinary stream.  He was started on finasteride by a outside urologist in [place] over 5 years ago without significant change in the urinary symptoms.  He had a DRE at that time that was extremely uncomfortable and he does not want any further DRE's.  Recent workup with PCP was reassuring with a normal urinalysis on 04/25/2022, normal PSA of 0.31(corrected for finasteride 0.62), and normal CT abdomen and pelvis in June 2023.  He recently was trialed on some Flomax and oxybutynin through PCP which temporarily improved his symptoms, but he has since discontinued those medications.  He drinks coffee, tea, soda  PVR today normal at 35 mL.  IPSS score today is 16.   PMH: Past Medical History:  Diagnosis Date   Anemia    Anginal pain (HCC)    Arthritis    BPH (benign prostatic hyperplasia)    Coronary artery disease    Eczema    Gastric ulcer    GERD (gastroesophageal reflux disease)    Gout    H. pylori infection    Hyperlipidemia    Hypertension    IBS (irritable bowel syndrome)    Pre-diabetes    Sleep apnea    Does not use CPAP    Surgical History: Past Surgical History:  Procedure Laterality Date   CARDIAC CATHETERIZATION     2022   CHOLECYSTECTOMY  2005   COLONOSCOPY  08/2010   CORONARY ARTERY BYPASS GRAFT N/A 01/10/2021   Procedure: CORONARY ARTERY BYPASS GRAFTING (CABG) X  FOUR ON PUMP USING LEFT INTERNAL MAMMARY ARTERY, LEFT RADIAL ARTERY, RIGHT ENDOSCOPIC GREATER SAPHENOUS VEIN HARVEST CONDUITS;  Surgeon: Lajuana Matte, MD;  Location: [facility];  Service: Open Heart Surgery;  Laterality: N/A;   EXPLORATION POST OPERATIVE OPEN HEART N/A 01/11/2021   Procedure: , MEDIASTINAL WASHOUT;  Surgeon: Lajuana Matte,  MD;  Location: [facility];  Service: Open Heart Surgery;  Laterality: N/A;   LEFT HEART CATH AND CORONARY ANGIOGRAPHY N/A 12/21/2020   Procedure: LEFT HEART CATH AND CORONARY ANGIOGRAPHY;  Surgeon: Nigel Mormon, MD;  Location: [facility];  Service: Cardiovascular;  Laterality: N/A;   RADIAL ARTERY HARVEST Left 01/10/2021   Procedure: RADIAL ARTERY HARVEST;  Surgeon: Lajuana Matte, MD;  Location: [facility];  Service: Open Heart Surgery;  Laterality: Left;   TEE WITHOUT CARDIOVERSION N/A 01/10/2021   Procedure: TRANSESOPHAGEAL ECHOCARDIOGRAM (TEE);  Surgeon: Lajuana Matte, MD;  Location: [facility];  Service: Open Heart Surgery;  Laterality: N/A;   UPPER GI ENDOSCOPY  2019   WISDOM TOOTH EXTRACTION       Family History: Family History  Problem Relation Age of Onset   Diabetes Brother    Hyperlipidemia Mother    Obesity Sister    Hypertension Sister    Diabetes Sister    Eating disorder Daughter    Eczema Son    Irritable bowel syndrome Son    Cancer Neg Hx    Early death Neg Hx    Heart disease Neg Hx    Kidney disease Neg Hx    Stroke Neg Hx    Alcohol abuse Neg Hx    Colon cancer Neg Hx    Esophageal cancer Neg Hx    Stomach cancer Neg  Hx    Rectal cancer Neg Hx     Social History:  reports that he quit smoking about 9 years ago. His smoking use included cigarettes. He has a 20.00 pack-year smoking history. He has been exposed to tobacco smoke. He has never used smokeless tobacco. He reports that he does not drink alcohol and does not use drugs.  Physical Exam: BP 131/86   Pulse 64   Ht '5\' 7"'$  (1.702 m)   Wt 195 lb (88.5 kg)   BMI 30.54 kg/m    Constitutional:  Alert and oriented, No acute distress. Cardiovascular: No clubbing, cyanosis, or edema. Respiratory: Normal respiratory effort, no increased work of breathing. GI: Abdomen is soft, nontender, nondistended, no abdominal masses GU: Widely patent meatus, no lesions DRE: Patient deferred  Laboratory  Data: Reviewed, see HPI  Pertinent Imaging: I have personally viewed and interpreted the recent CT abdomen and pelvis with contrast dated 02/06/2022 showing no urologic abnormalities, prostate measures 32 g, normal-appearing bladder and kidneys, no nephrolithiasis.   Assessment & Plan:   61 year old male with long history of urinary symptoms interested in other alternatives to finasteride.  Previously was on some oxybutynin and Flomax temporarily to which improved some of his symptoms.  Primary issues are frequency and some weak stream, PVR normal, your recent urinalysis normal, PSA normal, recent CT normal with prostate measuring 32 g.  Possible etiologies include BPH, overactive bladder, bladder irritants, less likely malignancy or urethral stricture.  I recommended a trial of Flomax 0.4 mg nightly and finasteride was discontinued Behavioral strategies discussed at length and avoiding bladder irritants Consider cystoscopy in the future if worsening or persistent symptoms   Nickolas Madrid, MD 04/05/2022  [facility] [address], [place], [place] [address] [phone]

## 2022-04-05 NOTE — Patient Instructions (Signed)
Urinary symptoms can come from an enlarged prostate, overactive bladder, or certain fluids that can irritate your urinary symptoms.  Cut back on tea, caffeine, soda, diet drinks, coffee, as these can all worsen urinary frequency and symptoms.  We will also replace your finasteride with Flomax to see if this improves some of your urination.  Your prostate and bladder are normal on the recent CT scan, PSA blood test that looks for prostate cancer was also normal, and recent urinalysis with primary doctor showed no blood or infection.  You are also emptying your bladder completely.

## 2022-04-26 ENCOUNTER — Other Ambulatory Visit: Payer: Self-pay | Admitting: Cardiology

## 2022-04-26 DIAGNOSIS — I2510 Atherosclerotic heart disease of native coronary artery without angina pectoris: Secondary | ICD-10-CM

## 2022-05-07 ENCOUNTER — Other Ambulatory Visit: Payer: Self-pay | Admitting: Cardiology

## 2022-05-24 ENCOUNTER — Ambulatory Visit: Payer: Commercial Managed Care - HMO | Admitting: Urology

## 2022-05-30 ENCOUNTER — Other Ambulatory Visit: Payer: Self-pay | Admitting: Cardiology

## 2022-05-30 DIAGNOSIS — I25119 Atherosclerotic heart disease of native coronary artery with unspecified angina pectoris: Secondary | ICD-10-CM

## 2022-05-30 DIAGNOSIS — R002 Palpitations: Secondary | ICD-10-CM

## 2022-06-27 ENCOUNTER — Ambulatory Visit: Payer: Commercial Managed Care - HMO | Admitting: Urology

## 2022-06-27 ENCOUNTER — Encounter: Payer: Self-pay | Admitting: Emergency Medicine

## 2022-06-27 ENCOUNTER — Other Ambulatory Visit: Payer: Self-pay | Admitting: Emergency Medicine

## 2022-06-27 DIAGNOSIS — K2100 Gastro-esophageal reflux disease with esophagitis, without bleeding: Secondary | ICD-10-CM

## 2022-06-29 ENCOUNTER — Ambulatory Visit: Payer: Self-pay | Admitting: Family Medicine

## 2022-07-04 ENCOUNTER — Ambulatory Visit (INDEPENDENT_AMBULATORY_CARE_PROVIDER_SITE_OTHER): Payer: Commercial Managed Care - HMO | Admitting: Emergency Medicine

## 2022-07-04 ENCOUNTER — Encounter: Payer: Self-pay | Admitting: Emergency Medicine

## 2022-07-04 VITALS — BP 122/84 | HR 79 | Temp 98.3°F | Ht 67.0 in | Wt 209.4 lb

## 2022-07-04 DIAGNOSIS — J320 Chronic maxillary sinusitis: Secondary | ICD-10-CM | POA: Diagnosis not present

## 2022-07-04 DIAGNOSIS — I10 Essential (primary) hypertension: Secondary | ICD-10-CM | POA: Diagnosis not present

## 2022-07-04 DIAGNOSIS — R0981 Nasal congestion: Secondary | ICD-10-CM

## 2022-07-04 DIAGNOSIS — I25119 Atherosclerotic heart disease of native coronary artery with unspecified angina pectoris: Secondary | ICD-10-CM

## 2022-07-04 DIAGNOSIS — K2100 Gastro-esophageal reflux disease with esophagitis, without bleeding: Secondary | ICD-10-CM

## 2022-07-04 MED ORDER — PREDNISONE 20 MG PO TABS: 40.0000 mg | ORAL_TABLET | Freq: Every day | ORAL | 0 refills | Status: AC

## 2022-07-04 MED ORDER — AMOXICILLIN-POT CLAVULANATE 875-125 MG PO TABS: 1.0000 | ORAL_TABLET | Freq: Two times a day (BID) | ORAL | 0 refills | Status: AC

## 2022-07-04 NOTE — Progress Notes (Signed)
Eugene Mccullough 61 y.o.   Chief Complaint  Patient presents with   Acute Visit    Cough , congestion, sore throat. Patient states he has been having these symptoms for over 4 months     HISTORY OF PRESENT ILLNESS: This is a 61 y.o. male complaining of acute exacerbation of chronic sinus symptoms.  Mostly complaining of sinus congestion with a couple of episodes of nosebleeds, sore throat, and painful blisters on side of his tongue for the past several weeks. Has history of GERD with occasional heartburn episodes and some chronic cough.  On PPI twice a day. No other complaints or medical concerns today.  HPI   Prior to Admission medications   Medication Sig Start Date End Date Taking? Authorizing Provider  amLODipine (NORVASC) 5 MG tablet TAKE 1 TABLET (5 MG TOTAL) BY MOUTH DAILY. 05/08/22  Yes Tolia, Sunit, DO  atorvastatin (LIPITOR) 40 MG tablet TAKE 1 TABLET BY MOUTH EVERYDAY AT BEDTIME 04/27/22  Yes Tolia, Sunit, DO  clopidogrel (PLAVIX) 75 MG tablet TAKE 1 TABLET BY MOUTH EVERY DAY 12/06/21  Yes Tolia, Sunit, DO  [facility] ASPIRIN LOW DOSE 81 MG EC tablet TAKE 1 TABLET (81 MG TOTAL) BY MOUTH DAILY. SWALLOW WHOLE. 01/08/22  Yes Tolia, Sunit, DO  desonide (DESOWEN) 0.05 % ointment Apply topically 2 (two) times daily as needed. 03/28/22  Yes [provider]  famotidine (PEPCID) 40 MG tablet TAKE 1 TABLET BY MOUTH EVERYDAY AT BEDTIME 06/27/22  Yes Tajuana Kniskern, Ines Bloomer, MD  gabapentin (NEURONTIN) 100 MG capsule Take 100 mg by mouth 2 (two) times daily. 03/28/22  Yes [provider]  metoprolol succinate (TOPROL-XL) 25 MG 24 hr tablet TAKE 1 TABLET BY MOUTH EVERY DAY IN THE MORNING 05/30/22  Yes Tolia, Sunit, DO  pantoprazole (PROTONIX) 40 MG tablet Take 40 mg by mouth 2 (two) times daily.   Yes [provider]  tamsulosin (FLOMAX) 0.4 MG CAPS capsule Take 1 capsule (0.4 mg total) by mouth daily. 04/05/22  Yes Billey Co, MD  tretinoin (RETIN-A) 0.025 % cream  SMARTSIG:sparingly Topical Every Night 03/28/22  Yes [provider]    Allergies  Allergen Reactions   Hydrocodone-Acetaminophen Itching   Oxycodone Itching    Patient Active Problem List   Diagnosis Date Noted   History of coronary artery disease 10/03/2021   Hx of CABG 01/10/2021   Essential hypertension 01/06/2021   Mixed hyperlipidemia 01/06/2021   Anemia    BPH (benign prostatic hyperplasia)    Eczema    GERD (gastroesophageal reflux disease)    Hyperlipidemia    IBS (irritable bowel syndrome)    Sleep apnea    CAD (coronary artery disease) 12/20/2020   Precordial pain    DDD (degenerative disc disease), lumbar 08/15/2018   Pes cavus 08/15/2018   History of IBS 08/15/2018   Generalized abdominal tenderness without rebound tenderness 08/14/2018   Borderline results on serologic testing for Lyme disease 07/05/2018   Arthralgia 07/02/2018   Lumbar radiculopathy 09/12/2017   PUD (peptic ulcer disease) 10/28/2014   Allergic rhinitis, cause unspecified 01/15/2014   Dermatitis, atopic 05/05/2013   Obstructive sleep apnea 02/02/2013   BPH with obstruction/lower urinary tract symptoms 01/02/2013   Hypertension 08/15/2011   Obesity (BMI 30-39.9)     Past Medical History:  Diagnosis Date   Anemia    Anginal pain (HCC)    Arthritis    BPH (benign prostatic hyperplasia)    Coronary artery disease    Eczema    Gastric  ulcer    GERD (gastroesophageal reflux disease)    Gout    H. pylori infection    Hyperlipidemia    Hypertension    IBS (irritable bowel syndrome)    Pre-diabetes    Sleep apnea    Does not use CPAP    Past Surgical History:  Procedure Laterality Date   CARDIAC CATHETERIZATION     2022   CHOLECYSTECTOMY  2005   COLONOSCOPY  08/2010   CORONARY ARTERY BYPASS GRAFT N/A 01/10/2021   Procedure: CORONARY ARTERY BYPASS GRAFTING (CABG) X  FOUR ON PUMP USING LEFT INTERNAL MAMMARY ARTERY, LEFT RADIAL ARTERY, RIGHT ENDOSCOPIC GREATER SAPHENOUS VEIN  HARVEST CONDUITS;  Surgeon: Lajuana Matte, MD;  Location: [facility];  Service: Open Heart Surgery;  Laterality: N/A;   EXPLORATION POST OPERATIVE OPEN HEART N/A 01/11/2021   Procedure: , MEDIASTINAL WASHOUT;  Surgeon: Lajuana Matte, MD;  Location: [facility];  Service: Open Heart Surgery;  Laterality: N/A;   LEFT HEART CATH AND CORONARY ANGIOGRAPHY N/A 12/21/2020   Procedure: LEFT HEART CATH AND CORONARY ANGIOGRAPHY;  Surgeon: Nigel Mormon, MD;  Location: [facility];  Service: Cardiovascular;  Laterality: N/A;   RADIAL ARTERY HARVEST Left 01/10/2021   Procedure: RADIAL ARTERY HARVEST;  Surgeon: Lajuana Matte, MD;  Location: [facility];  Service: Open Heart Surgery;  Laterality: Left;   TEE WITHOUT CARDIOVERSION N/A 01/10/2021   Procedure: TRANSESOPHAGEAL ECHOCARDIOGRAM (TEE);  Surgeon: Lajuana Matte, MD;  Location: [facility];  Service: Open Heart Surgery;  Laterality: N/A;   UPPER GI ENDOSCOPY  2019   WISDOM TOOTH EXTRACTION      Social History   Socioeconomic History   Marital status: Married    Spouse name: Not on file   Number of children: 3   Years of education: 65   Highest education level: Not on file  Occupational History   Occupation: driver  Tobacco Use   Smoking status: Former    Packs/day: 1.00    Years: 20.00    Total pack years: 20.00    Types: Cigarettes    Quit date: 2014    Years since quitting: 9.8    Passive exposure: Past   Smokeless tobacco: Never  Vaping Use   Vaping Use: Never used  Substance and Sexual Activity   Alcohol use: No    Alcohol/week: 0.0 standard drinks of alcohol   Drug use: Never   Sexual activity: Yes  Other Topics Concern   Not on file  Social History Narrative   Not on file   Social Determinants of Health   Financial Resource Strain: Not on file  Food Insecurity: Not on file  Transportation Needs: Not on file  Physical Activity: Not on file  Stress: Not on file  Social Connections: Not on file  Intimate  Partner Violence: Not on file    Family History  Problem Relation Age of Onset   Diabetes Brother    Hyperlipidemia Mother    Obesity Sister    Hypertension Sister    Diabetes Sister    Eating disorder Daughter    Eczema Son    Irritable bowel syndrome Son    Cancer Neg Hx    Early death Neg Hx    Heart disease Neg Hx    Kidney disease Neg Hx    Stroke Neg Hx    Alcohol abuse Neg Hx    Colon cancer Neg Hx    Esophageal cancer Neg Hx    Stomach  cancer Neg Hx    Rectal cancer Neg Hx      Review of Systems  Constitutional: Negative.  Negative for fever.  HENT:  Positive for congestion. Negative for sore throat.   Respiratory:  Negative for cough and shortness of breath.   Cardiovascular: Negative.  Negative for chest pain.  Gastrointestinal: Negative.  Negative for abdominal pain, nausea and vomiting.  Genitourinary: Negative.   Skin: Negative.  Negative for rash.  Neurological: Negative.  Negative for dizziness and headaches.   Today's Vitals   07/04/22 1405  BP: 122/84  Pulse: 79  Temp: 98.3 F (36.8 C)  TempSrc: Oral  SpO2: 98%  Weight: 209 lb 6 oz (95 kg)  Height: '5\' 7"'$  (1.702 m)   Body mass index is 32.79 kg/m.   Physical Exam Vitals reviewed.  Constitutional:      Appearance: Normal appearance.  HENT:     Head: Normocephalic.     Right Ear: Tympanic membrane, ear canal and external ear normal.     Left Ear: Tympanic membrane, ear canal and external ear normal.     Nose: Congestion present.     Mouth/Throat:     Mouth: Mucous membranes are moist.     Pharynx: Oropharynx is clear.  Eyes:     Extraocular Movements: Extraocular movements intact.     Conjunctiva/sclera: Conjunctivae normal.     Pupils: Pupils are equal, round, and reactive to light.  Cardiovascular:     Rate and Rhythm: Normal rate and regular rhythm.     Pulses: Normal pulses.     Heart sounds: Normal heart sounds.  Pulmonary:     Effort: Pulmonary effort is normal.     Breath  sounds: Normal breath sounds.  Musculoskeletal:     Cervical back: No tenderness.  Lymphadenopathy:     Cervical: No cervical adenopathy.  Skin:    General: Skin is warm and dry.  Neurological:     General: No focal deficit present.     Mental Status: He is alert and oriented to person, place, and time.  Psychiatric:        Mood and Affect: Mood normal.        Behavior: Behavior normal.      ASSESSMENT & PLAN: A total of 42 minutes was spent with the patient and counseling/coordination of care regarding preparing for this visit, review of most recent office visit notes, review of multiple chronic medical problems and their management, review of all medications, diagnosis of chronic maxillary sinusitis and treatment, need for ENT evaluation, prognosis, documentation, need for follow-up.  Problem List Items Addressed This Visit       Cardiovascular and Mediastinum   CAD (coronary artery disease)    Stable without recent anginal episodes. Continue metoprolol succinate 25 mg daily, Plavix 75 mg and low-dose baby aspirin daily.      Essential hypertension    Well-controlled hypertension. Continue amlodipine 5 mg daily and metoprolol succinate 25 mg daily. BP Readings from Last 3 Encounters:  07/04/22 122/84  04/05/22 131/86  03/29/22 116/70          Respiratory   Chronic maxillary sinusitis - Primary    Acute exacerbation. Recommend Augmentin 875 mg twice a day for 7 days Needs ENT evaluation Referral yesterday.      Relevant Medications   amoxicillin-clavulanate (AUGMENTIN) 875-125 MG tablet   predniSONE (DELTASONE) 20 MG tablet   Other Relevant Orders   Ambulatory referral to ENT   Sinus congestion  Avoid oral decongestants. Recommend prednisone 40 mg daily for 5 days along with antibiotics.      Relevant Medications   amoxicillin-clavulanate (AUGMENTIN) 875-125 MG tablet   predniSONE (DELTASONE) 20 MG tablet   Other Relevant Orders   Ambulatory referral  to ENT     Digestive   GERD (gastroesophageal reflux disease)    Stable.  Takes pantoprazole 40 mg twice a day.      Patient Instructions  Sinus Infection, Adult A sinus infection is soreness and swelling (inflammation) of your sinuses. Sinuses are hollow spaces in the bones around your face. They are located: Around your eyes. In the middle of your forehead. Behind your nose. In your cheekbones. Your sinuses and nasal passages are lined with a fluid called mucus. Mucus drains out of your sinuses. Swelling can trap mucus in your sinuses. This lets germs (bacteria, virus, or fungus) grow, which leads to infection. Most of the time, this condition is caused by a virus. What are the causes? Allergies. Asthma. Germs. Things that block your nose or sinuses. Growths in the nose (nasal polyps). Chemicals or irritants in the air. A fungus. This is rare. What increases the risk? Having a weak body defense system (immune system). Doing a lot of swimming or diving. Using nasal sprays too much. Smoking. What are the signs or symptoms? The main symptoms of this condition are pain and a feeling of pressure around the sinuses. Other symptoms include: Stuffy nose (congestion). This may make it hard to breathe through your nose. Runny nose (drainage). Soreness, swelling, and warmth in the sinuses. A cough that may get worse at night. Being unable to smell and taste. Mucus that collects in the throat or the back of the nose (postnasal drip). This may cause a sore throat or bad breath. Being very tired (fatigued). A fever. How is this diagnosed? Your symptoms. Your medical history. A physical exam. Tests to find out if your condition is short-term (acute) or long-term (chronic). Your doctor may: Check your nose for growths (polyps). Check your sinuses using a tool that has a light on one end (endoscope). Check for allergies or germs. Do imaging tests, such as an MRI or CT scan. How is  this treated? Treatment for this condition depends on the cause and whether it is short-term or long-term. If caused by a virus, your symptoms should go away on their own within 10 days. You may be given medicines to relieve symptoms. They include: Medicines that shrink swollen tissue in the nose. A spray that treats swelling of the nostrils. Rinses that help get rid of thick mucus in your nose (nasal saline washes). Medicines that treat allergies (antihistamines). Over-the-counter pain relievers. If caused by bacteria, your doctor may wait to see if you will get better without treatment. You may be given antibiotic medicine if you have: A very bad infection. A weak body defense system. If caused by growths in the nose, surgery may be needed. Follow these instructions at home: Medicines Take, use, or apply over-the-counter and prescription medicines only as told by your doctor. These may include nasal sprays. If you were prescribed an antibiotic medicine, take it as told by your doctor. Do not stop taking it even if you start to feel better. Hydrate and humidify  Drink enough water to keep your pee (urine) pale yellow. Use a cool mist humidifier to keep the humidity level in your home above 50%. Breathe in steam for 10-15 minutes, 3-4 times a day, or as  told by your doctor. You can do this in the bathroom while a hot shower is running. Try not to spend time in cool or dry air. Rest Rest as much as you can. Sleep with your head raised (elevated). Make sure you get enough sleep each night. General instructions  Put a warm, moist washcloth on your face 3-4 times a day, or as often as told by your doctor. Use nasal saline washes as often as told by your doctor. Wash your hands often with soap and water. If you cannot use soap and water, use hand sanitizer. Do not smoke. Avoid being around people who are smoking (secondhand smoke). Keep all follow-up visits. Contact a doctor if: You have  a fever. Your symptoms get worse. Your symptoms do not get better within 10 days. Get help right away if: You have a very bad headache. You cannot stop vomiting. You have very bad pain or swelling around your face or eyes. You have trouble seeing. You feel confused. Your neck is stiff. You have trouble breathing. These symptoms may be an emergency. Get help right away. Call 911. Do not wait to see if the symptoms will go away. Do not drive yourself to the hospital. Summary A sinus infection is swelling of your sinuses. Sinuses are hollow spaces in the bones around your face. This condition is caused by tissues in your nose that become inflamed or swollen. This traps germs. These can lead to infection. If you were prescribed an antibiotic medicine, take it as told by your doctor. Do not stop taking it even if you start to feel better. Keep all follow-up visits. This information is not intended to replace advice given to you by your health care provider. Make sure you discuss any questions you have with your health care provider. Document Revised: 07/18/2021 Document Reviewed: 07/18/2021 Elsevier Patient Education  2023 [place], MD [facility]

## 2022-07-04 NOTE — Assessment & Plan Note (Signed)
Stable.  Takes pantoprazole 40 mg twice a day.

## 2022-07-04 NOTE — Assessment & Plan Note (Signed)
Avoid oral decongestants. Recommend prednisone 40 mg daily for 5 days along with antibiotics.

## 2022-07-04 NOTE — Assessment & Plan Note (Signed)
Acute exacerbation. Recommend Augmentin 875 mg twice a day for 7 days Needs ENT evaluation Referral yesterday.

## 2022-07-04 NOTE — Assessment & Plan Note (Signed)
Well-controlled hypertension. Continue amlodipine 5 mg daily and metoprolol succinate 25 mg daily. BP Readings from Last 3 Encounters:  07/04/22 122/84  04/05/22 131/86  03/29/22 116/70

## 2022-07-04 NOTE — Patient Instructions (Signed)

## 2022-07-04 NOTE — Assessment & Plan Note (Signed)
Stable without recent anginal episodes. Continue metoprolol succinate 25 mg daily, Plavix 75 mg and low-dose baby aspirin daily.

## 2022-07-12 IMAGING — CR DG CHEST 2V
2 series · 2 of 2 positions shown · non-contrast
Comparison: None.

CLINICAL DATA: 60-year-old male with coronary artery disease.

EXAM:
CHEST - 2 VIEW

[w chest pa]
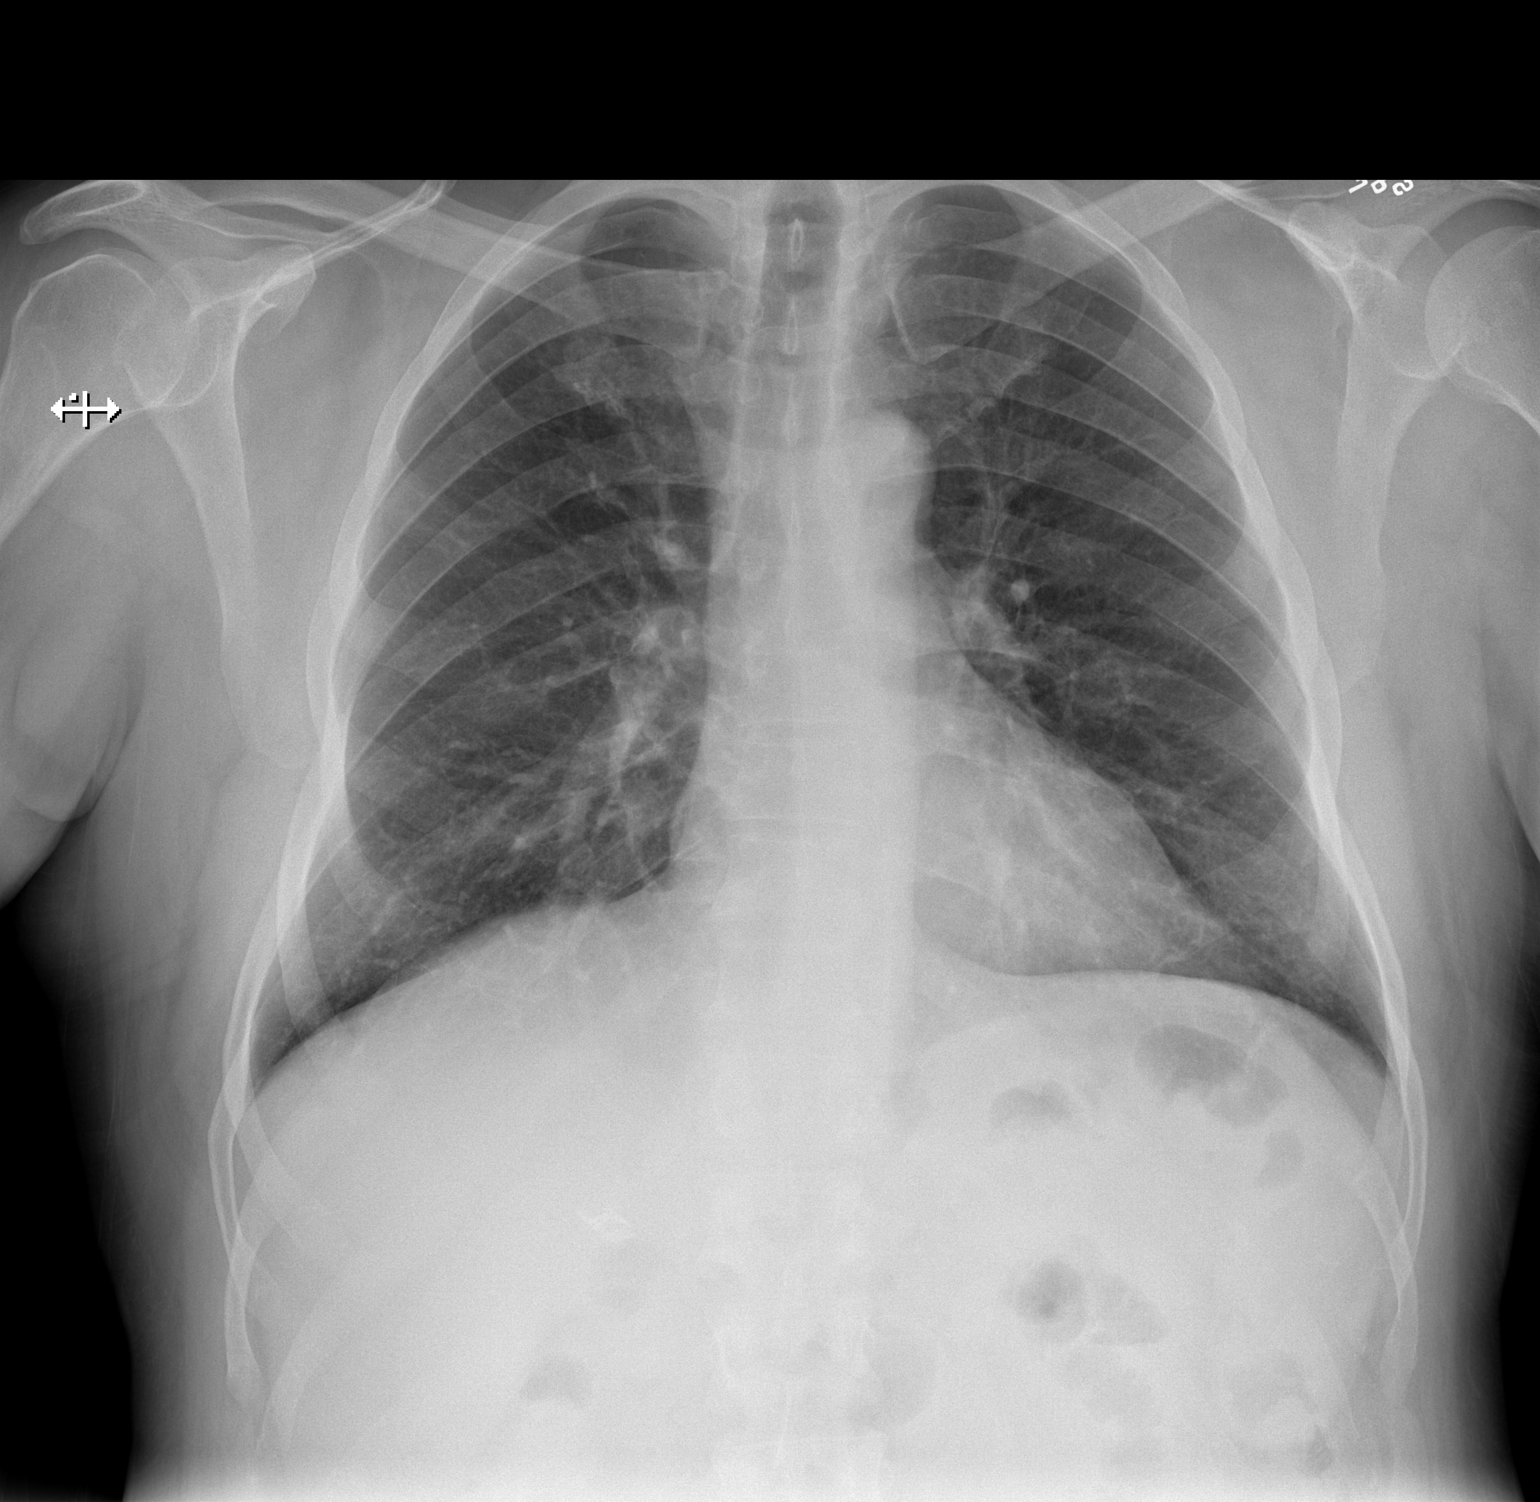

[w chest lat]
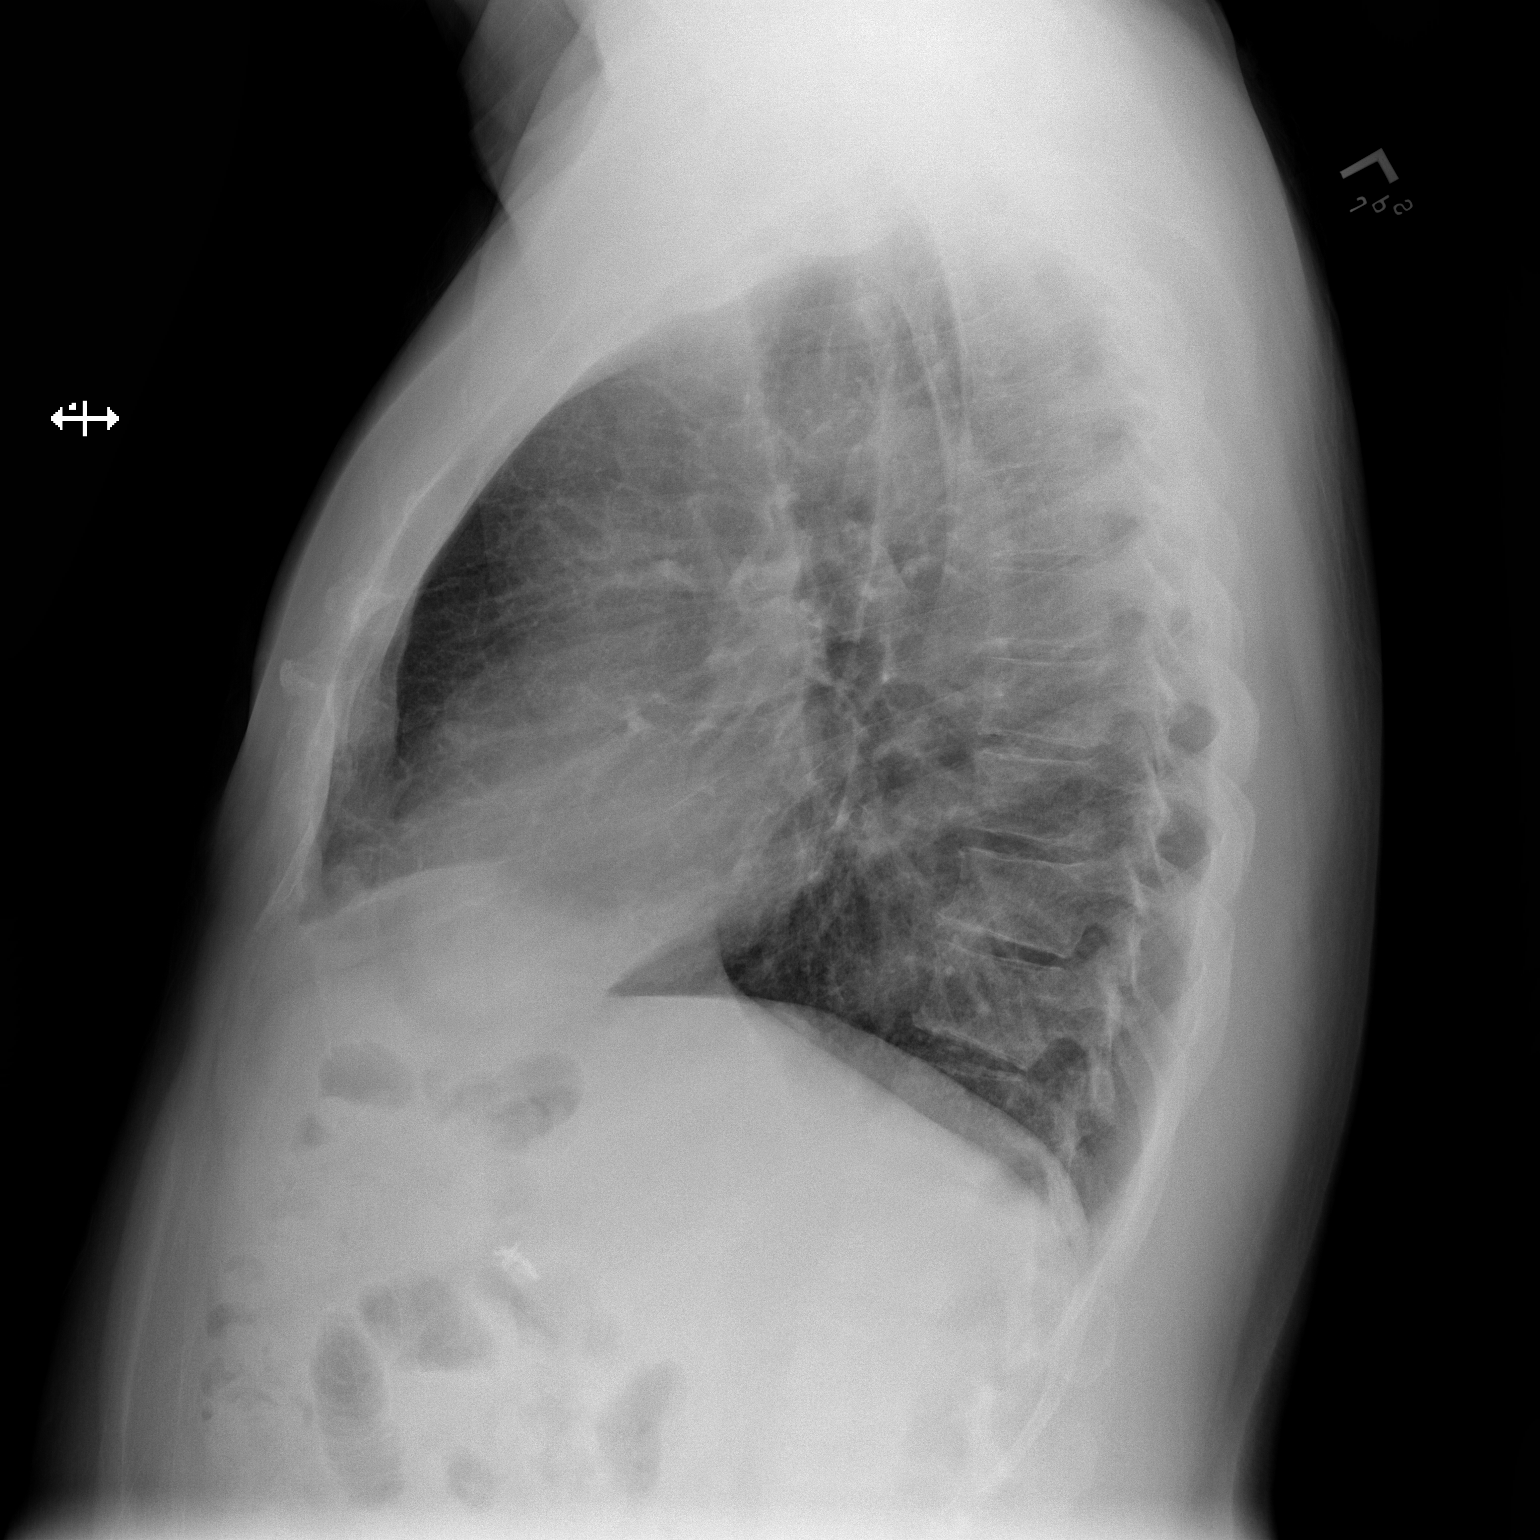

[2 of 2 positions shown; findings below may reference images not displayed]

FINDINGS: No focal consolidation, pleural effusion, or pneumothorax. The
cardiac silhouette is within limits. No acute osseous pathology.
Right upper quadrant cholecystectomy clips.
IMPRESSION: No active cardiopulmonary disease.

## 2022-08-07 ENCOUNTER — Encounter: Payer: Self-pay | Admitting: Emergency Medicine

## 2022-08-13 ENCOUNTER — Other Ambulatory Visit: Payer: Self-pay | Admitting: Emergency Medicine

## 2022-08-13 MED ORDER — PANTOPRAZOLE SODIUM 40 MG PO TBEC: 40.0000 mg | DELAYED_RELEASE_TABLET | Freq: Two times a day (BID) | ORAL | 3 refills | Status: DC

## 2022-08-13 MED ORDER — GABAPENTIN 100 MG PO CAPS: 100.0000 mg | ORAL_CAPSULE | Freq: Two times a day (BID) | ORAL | 1 refills | Status: AC

## 2022-08-21 ENCOUNTER — Other Ambulatory Visit: Payer: Self-pay | Admitting: Cardiology

## 2022-08-21 DIAGNOSIS — R002 Palpitations: Secondary | ICD-10-CM

## 2022-08-21 DIAGNOSIS — I25119 Atherosclerotic heart disease of native coronary artery with unspecified angina pectoris: Secondary | ICD-10-CM

## 2022-08-24 ENCOUNTER — Ambulatory Visit
Admission: EM | Admit: 2022-08-24 | Discharge: 2022-08-24 | Disposition: A | Discharge status: Home / Self Care | Payer: Commercial Managed Care - HMO | Attending: Physician Assistant | Admitting: Physician Assistant

## 2022-08-24 DIAGNOSIS — R1013 Epigastric pain: Secondary | ICD-10-CM

## 2022-08-24 MED ORDER — ONDANSETRON HCL 4 MG PO TABS: 4.0000 mg | ORAL_TABLET | Freq: Three times a day (TID) | ORAL | 0 refills | Status: AC | PRN

## 2022-08-24 NOTE — ED Provider Notes (Signed)
[facility]    CSN: [id number] Arrival date & time: 08/24/22  1226      History   Chief Complaint Chief Complaint  Patient presents with   Abdominal Pain    HPI Eugene Mccullough is a 61 y.o. male.   76-year-old male presents with abdominal pain.  Patient indicates he has a history of having indigestion with reflux and takes Protonix 40 mg twice daily which helps control the symptoms.  Patient indicates for the past 2 days he has been having abdominal pain which is epigastric and right upper quadrant.  Patient describes the pain as being sharp and steady, localized, lasting for about 10 minutes and then resolving.  Patient indicates that the pain has been occurring multiple times throughout the day.  He indicates that it is not associated with food as the pain occurs separate from meals but also occurs after eating.  Denies having nausea, vomiting, fever, or chills.  Patient also indicates that bowel movements have been normal without any blood or dark stools.  Patient indicates he did have a cholecystectomy 15 years ago.  Patient indicates that his most recent travel was 4 weeks ago when he went to [place] for 2 weeks.  When he was on the trip he did well but he did have similar type pain before he left for 1 to 2 days and it resolved.   Abdominal Pain   Past Medical History:  Diagnosis Date   Anemia    Anginal pain (HCC)    Arthritis    BPH (benign prostatic hyperplasia)    Coronary artery disease    Eczema    Gastric ulcer    GERD (gastroesophageal reflux disease)    Gout    H. pylori infection    Hyperlipidemia    Hypertension    IBS (irritable bowel syndrome)    Pre-diabetes    Sleep apnea    Does not use CPAP    Patient Active Problem List   Diagnosis Date Noted   Chronic maxillary sinusitis 07/04/2022   Sinus congestion 07/04/2022   History of coronary artery disease 10/03/2021   Hx of CABG 01/10/2021   Essential hypertension 01/06/2021   Mixed  hyperlipidemia 01/06/2021   Anemia    BPH (benign prostatic hyperplasia)    Eczema    GERD (gastroesophageal reflux disease)    Hyperlipidemia    IBS (irritable bowel syndrome)    Sleep apnea    CAD (coronary artery disease) 12/20/2020   Precordial pain    DDD (degenerative disc disease), lumbar 08/15/2018   Pes cavus 08/15/2018   History of IBS 08/15/2018   Borderline results on serologic testing for Lyme disease 07/05/2018   Arthralgia 07/02/2018   Lumbar radiculopathy 09/12/2017   PUD (peptic ulcer disease) 10/28/2014   Allergic rhinitis, cause unspecified 01/15/2014   Dermatitis, atopic 05/05/2013   Obstructive sleep apnea 02/02/2013   BPH with obstruction/lower urinary tract symptoms 01/02/2013   Hypertension 08/15/2011   Obesity (BMI 30-39.9)     Past Surgical History:  Procedure Laterality Date   CARDIAC CATHETERIZATION     2022   CHOLECYSTECTOMY  2005   COLONOSCOPY  08/2010   CORONARY ARTERY BYPASS GRAFT N/A 01/10/2021   Procedure: CORONARY ARTERY BYPASS GRAFTING (CABG) X  FOUR ON PUMP USING LEFT INTERNAL MAMMARY ARTERY, LEFT RADIAL ARTERY, RIGHT ENDOSCOPIC GREATER SAPHENOUS VEIN HARVEST CONDUITS;  Surgeon: Lajuana Matte, MD;  Location: [place];  Service: Open Heart Surgery;  Laterality: N/A;   EXPLORATION POST  OPERATIVE OPEN HEART N/A 01/11/2021   Procedure: , MEDIASTINAL WASHOUT;  Surgeon: Lajuana Matte, MD;  Location: [place];  Service: Open Heart Surgery;  Laterality: N/A;   LEFT HEART CATH AND CORONARY ANGIOGRAPHY N/A 12/21/2020   Procedure: LEFT HEART CATH AND CORONARY ANGIOGRAPHY;  Surgeon: Nigel Mormon, MD;  Location: [facility];  Service: Cardiovascular;  Laterality: N/A;   RADIAL ARTERY HARVEST Left 01/10/2021   Procedure: RADIAL ARTERY HARVEST;  Surgeon: Lajuana Matte, MD;  Location: [place];  Service: Open Heart Surgery;  Laterality: Left;   TEE WITHOUT CARDIOVERSION N/A 01/10/2021   Procedure: TRANSESOPHAGEAL ECHOCARDIOGRAM (TEE);   Surgeon: Lajuana Matte, MD;  Location: [place];  Service: Open Heart Surgery;  Laterality: N/A;   UPPER GI ENDOSCOPY  2019   WISDOM TOOTH EXTRACTION         Home Medications    Prior to Admission medications   Medication Sig Start Date End Date Taking? Authorizing Provider  ondansetron (ZOFRAN) 4 MG tablet Take 1 tablet (4 mg total) by mouth every 8 (eight) hours as needed for nausea or vomiting. 08/24/22  Yes Nyoka Lint, PA-C  amLODipine (NORVASC) 5 MG tablet TAKE 1 TABLET (5 MG TOTAL) BY MOUTH DAILY. 05/08/22   Tolia, Sunit, DO  atorvastatin (LIPITOR) 40 MG tablet TAKE 1 TABLET BY MOUTH EVERYDAY AT BEDTIME 04/27/22   Tolia, Sunit, DO  clopidogrel (PLAVIX) 75 MG tablet TAKE 1 TABLET BY MOUTH EVERY DAY 12/06/21   Tolia, Sunit, DO  [facility] ASPIRIN LOW DOSE 81 MG EC tablet TAKE 1 TABLET (81 MG TOTAL) BY MOUTH DAILY. SWALLOW WHOLE. 01/08/22   Tolia, Sunit, DO  desonide (DESOWEN) 0.05 % ointment Apply topically 2 (two) times daily as needed. 03/28/22   [provider]  famotidine (PEPCID) 40 MG tablet TAKE 1 TABLET BY MOUTH EVERYDAY AT BEDTIME 06/27/22   Sagardia, Ines Bloomer, MD  gabapentin (NEURONTIN) 100 MG capsule Take 1 capsule (100 mg total) by mouth 2 (two) times daily. 08/13/22   Horald Pollen, MD  metoprolol succinate (TOPROL-XL) 25 MG 24 hr tablet TAKE 1 TABLET BY MOUTH EVERY DAY IN THE MORNING 08/22/22   Tolia, Sunit, DO  pantoprazole (PROTONIX) 40 MG tablet TAKE 1 TABLET BY MOUTH TWICE A DAY 08/13/22   Horald Pollen, MD  tamsulosin (FLOMAX) 0.4 MG CAPS capsule Take 1 capsule (0.4 mg total) by mouth daily. 04/05/22   Billey Co, MD  tretinoin (RETIN-A) 0.025 % cream SMARTSIG:sparingly Topical Every Night 03/28/22   [provider]    Family History Family History  Problem Relation Age of Onset   Diabetes Brother    Hyperlipidemia Mother    Obesity Sister    Hypertension Sister    Diabetes Sister    Eating disorder Daughter    Eczema Son     Irritable bowel syndrome Son    Cancer Neg Hx    Early death Neg Hx    Heart disease Neg Hx    Kidney disease Neg Hx    Stroke Neg Hx    Alcohol abuse Neg Hx    Colon cancer Neg Hx    Esophageal cancer Neg Hx    Stomach cancer Neg Hx    Rectal cancer Neg Hx     Social History Social History   Tobacco Use   Smoking status: Former    Packs/day: 1.00    Years: 20.00    Total pack years: 20.00    Types: Cigarettes  Quit date: 2014    Years since quitting: 9.9    Passive exposure: Past   Smokeless tobacco: Never  Vaping Use   Vaping Use: Never used  Substance Use Topics   Alcohol use: No    Alcohol/week: 0.0 standard drinks of alcohol   Drug use: Never     Allergies   Hydrocodone-acetaminophen and Oxycodone   Review of Systems Review of Systems  Gastrointestinal:  Positive for abdominal pain (epigastric).     Physical Exam Triage Vital Signs ED Triage Vitals [08/24/22 1355]  Enc Vitals Group     BP 127/84     Pulse Rate 88     Resp 19     Temp 98.1 F (36.7 C)     Temp src      SpO2 98 %     Weight      Height      Head Circumference      Peak Flow      Pain Score 0     Pain Loc      Pain Edu?      Excl. in [place]?    No data found.  Updated Vital Signs BP 127/84   Pulse 88   Temp 98.1 F (36.7 C)   Resp 19   SpO2 98%   Visual Acuity Right Eye Distance:   Left Eye Distance:   Bilateral Distance:    Right Eye Near:   Left Eye Near:    Bilateral Near:     Physical Exam Constitutional:      Appearance: He is well-developed.  Abdominal:     General: Abdomen is flat. Bowel sounds are normal.     Palpations: Abdomen is soft.     Tenderness: There is abdominal tenderness in the right upper quadrant and epigastric area. There is no guarding or rebound.  Neurological:     Mental Status: He is alert.      UC Treatments / Results  Labs (all labs ordered are listed, but only abnormal results are displayed) Labs Reviewed  CBC WITH  DIFFERENTIAL/PLATELET  COMPREHENSIVE METABOLIC PANEL    EKG   Radiology No results found.  Procedures Procedures (including critical care time)  Medications Ordered in UC Medications - No data to display  Initial Impression / Assessment and Plan / UC Course  I have reviewed the triage vital signs and the nursing notes.  Pertinent labs & imaging results that were available during my care of the patient were reviewed by me and considered in my medical decision making (see chart for details).    Plan: 1.  The epigastric pain will be treated with the following: A.  Zofran 4 mg every 6 hours on a regular basis for the pain. B.  Patient advised to continue taking the Protonix 40 mg twice daily. C.  CBC and CMP labs are pending. 2.  Patient advised to decrease caffeine intake and to avoid foods that are fried, spicy, greasy, and tomato-based [place] foods for the next couple days. 3.  Patient advised follow-up PCP or return to urgent care if symptoms fail to improve.  (Consider GI consult) Final Clinical Impressions(s) / UC Diagnoses   Final diagnoses:  Epigastric pain     Discharge Instructions      Count and comprehensive metabolic profile labs will be completed in 48 hours.  If you do not get a call from this office that indicates these labs are normal.  Log onto MyChart to view the test results  when they post in 48 hours.  Advised to continue to take the tonics 40 mg twice daily to control the indigestion and reflux.  I have sent some Zofran to the pharmacy that she can take every 6 hours on a regular basis to see if this will help cut down on the abdominal pain and discomfort. Advised to reduce the amount of caffeine intake daily to 2 cups only and then caffeine free. Advised to avoid foods that are fried, spicy, greasy and tomato-based [place] foods over the next 2 to 3 days to see if this helps.  Advised to follow-up PCP or return to urgent care if symptoms fail to  improve.    ED Prescriptions     Medication Sig Dispense Auth. Provider   ondansetron (ZOFRAN) 4 MG tablet Take 1 tablet (4 mg total) by mouth every 8 (eight) hours as needed for nausea or vomiting. 20 tablet Nyoka Lint, PA-C      PDMP not reviewed this encounter.   Nyoka Lint, PA-C 08/24/22 1428

## 2022-08-24 NOTE — ED Triage Notes (Signed)
Pt presents to uc with co of abd pain that comes and goes for 2 days. Pt denies any nausea or vomiting. Pt reports normal bm this morning

## 2022-08-24 NOTE — Discharge Instructions (Signed)
Count and comprehensive metabolic profile labs will be completed in 48 hours.  If you do not get a call from this office that indicates these labs are normal.  Log onto MyChart to view the test results when they post in 48 hours.  Advised to continue to take the tonics 40 mg twice daily to control the indigestion and reflux.  I have sent some Zofran to the pharmacy that she can take every 6 hours on a regular basis to see if this will help cut down on the abdominal pain and discomfort. Advised to reduce the amount of caffeine intake daily to 2 cups only and then caffeine free. Advised to avoid foods that are fried, spicy, greasy and tomato-based [place] foods over the next 2 to 3 days to see if this helps.  Advised to follow-up PCP or return to urgent care if symptoms fail to improve.

## 2022-08-25 LAB — COMPREHENSIVE METABOLIC PANEL
ALT: 21 IU/L (ref 0–44)
AST: 19 IU/L (ref 0–40)
Albumin/Globulin Ratio: 1.2 (ref 1.2–2.2)
Albumin: 4.6 g/dL (ref 3.9–4.9)
Alkaline Phosphatase: 81 IU/L (ref 44–121)
BUN/Creatinine Ratio: 16 (ref 10–24)
BUN: 19 mg/dL (ref 8–27)
Bilirubin Total: 0.4 mg/dL (ref 0.0–1.2)
CO2: 19 mmol/L — ABNORMAL LOW (ref 20–29)
Calcium: 10.1 mg/dL (ref 8.6–10.2)
Chloride: 103 mmol/L (ref 96–106)
Creatinine, Ser: 1.16 mg/dL (ref 0.76–1.27)
Globulin, Total: 3.9 g/dL (ref 1.5–4.5)
Glucose: 117 mg/dL — ABNORMAL HIGH (ref 70–99)
Potassium: 4.6 mmol/L (ref 3.5–5.2)
Sodium: 139 mmol/L (ref 134–144)
Total Protein: 8.5 g/dL (ref 6.0–8.5)
eGFR: 72 mL/min/{1.73_m2} (ref >59)

## 2022-08-25 LAB — CBC WITH DIFFERENTIAL/PLATELET
Basophils Absolute: 0.1 10*3/uL (ref 0.0–0.2)
Basos: 1 %
EOS (ABSOLUTE): 0.2 10*3/uL (ref 0.0–0.4)
Eos: 3 %
Hematocrit: 40.6 % (ref 37.5–51.0)
Hemoglobin: 13.2 g/dL (ref 13.0–17.7)
Immature Grans (Abs): 0 10*3/uL (ref 0.0–0.1)
Immature Granulocytes: 0 %
Lymphocytes Absolute: 1.9 10*3/uL (ref 0.7–3.1)
Lymphs: 36 %
MCH: 25.1 pg — ABNORMAL LOW (ref 26.6–33.0)
MCHC: 32.5 g/dL (ref 31.5–35.7)
MCV: 77 fL — ABNORMAL LOW (ref 79–97)
Monocytes Absolute: 0.6 10*3/uL (ref 0.1–0.9)
Monocytes: 11 %
Neutrophils Absolute: 2.6 10*3/uL (ref 1.4–7.0)
Neutrophils: 49 %
Platelets: 136 10*3/uL — ABNORMAL LOW (ref 150–450)
RBC: 5.25 x10E6/uL (ref 4.14–5.80)
RDW: 15.8 % — ABNORMAL HIGH (ref 11.6–15.4)
WBC: 5.3 10*3/uL (ref 3.4–10.8)

## 2022-09-06 IMAGING — CR DG CHEST 2V
2 series · 2 of 2 positions shown · non-contrast
Comparison: 01/12/2021

CLINICAL DATA: Status post CABG

EXAM:
CHEST - 2 VIEW

[w chest pa]
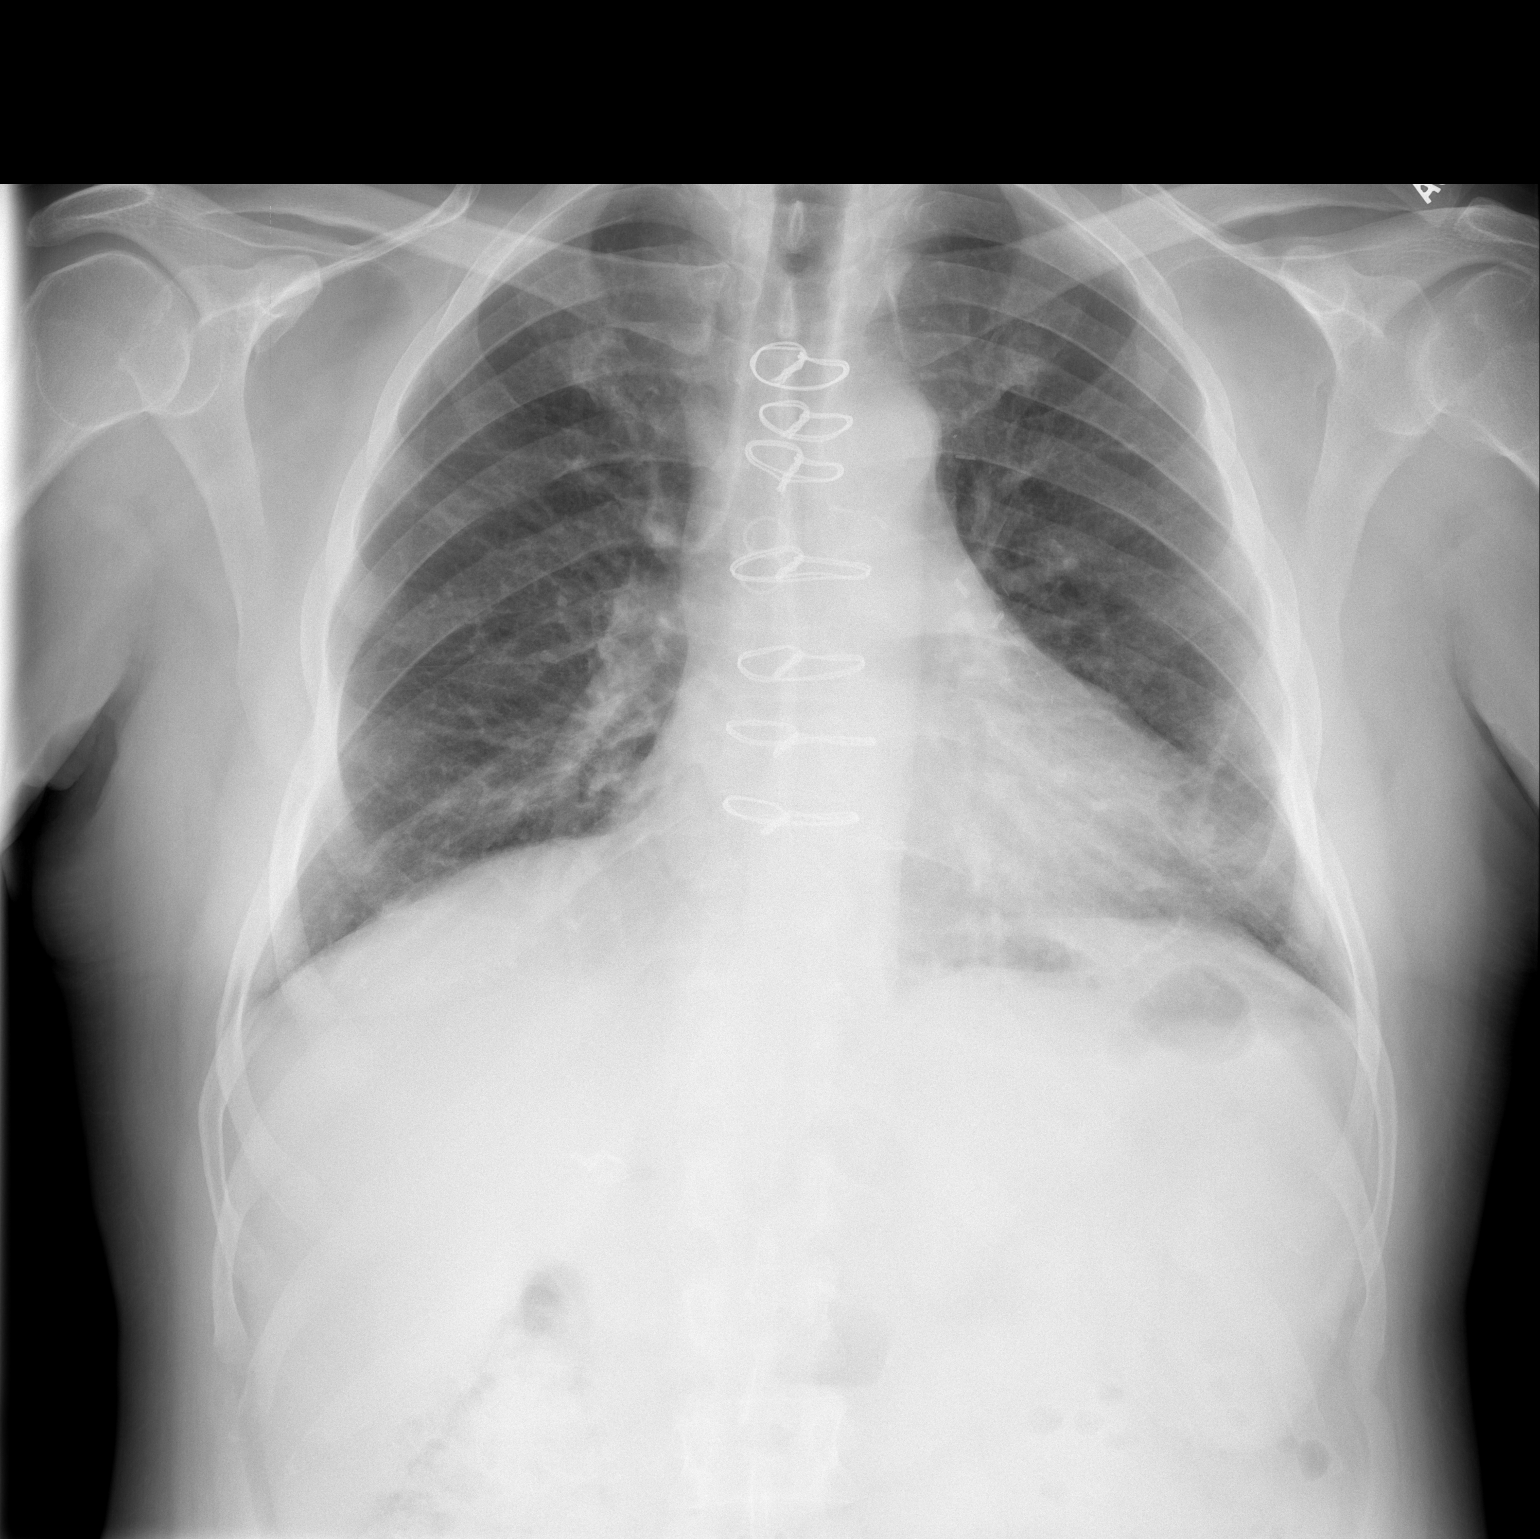

[w chest lat *]
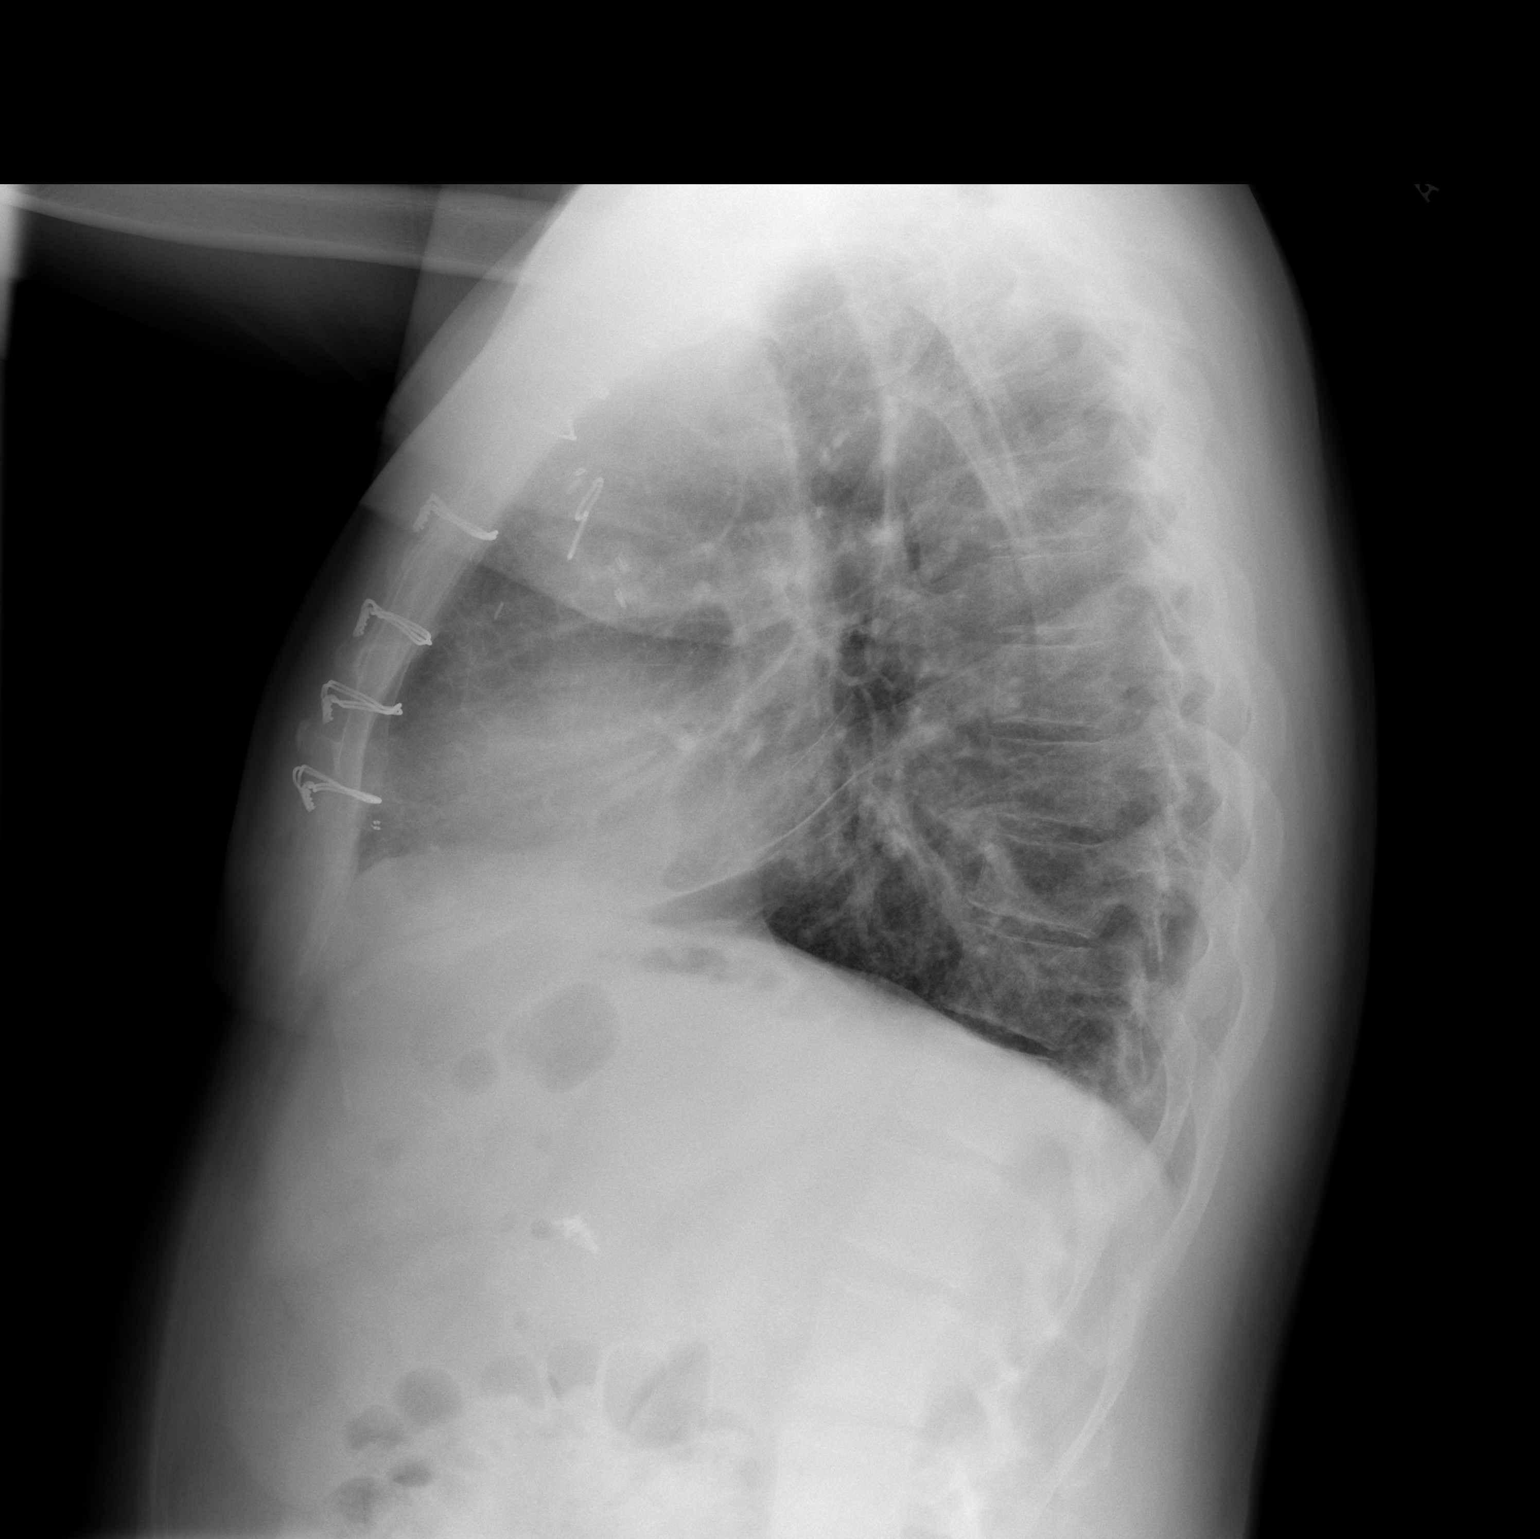

[2 of 2 positions shown; findings below may reference images not displayed]

FINDINGS: Mild cardiomegaly status post median sternotomy and CABG. Mild
residual scarring or atelectasis of the left lung base. The
visualized skeletal structures are unremarkable.
IMPRESSION: 1.  Mild cardiomegaly status post median sternotomy and CABG.

2.  Mild residual scarring or atelectasis of the left lung base.

## 2022-09-11 ENCOUNTER — Other Ambulatory Visit: Payer: Self-pay | Admitting: Emergency Medicine

## 2022-09-12 ENCOUNTER — Other Ambulatory Visit (HOSPITAL_COMMUNITY): Payer: Self-pay

## 2022-09-12 ENCOUNTER — Other Ambulatory Visit: Payer: Self-pay

## 2022-09-12 MED ORDER — DESONIDE 0.05 % EX OINT
TOPICAL_OINTMENT | Freq: Two times a day (BID) | CUTANEOUS | 0 refills | Status: AC | PRN
  Filled 2022-09-12: qty 15, 30d supply, fill #0

## 2022-09-13 ENCOUNTER — Other Ambulatory Visit (HOSPITAL_COMMUNITY): Payer: Self-pay

## 2022-09-27 ENCOUNTER — Other Ambulatory Visit (HOSPITAL_COMMUNITY): Payer: Self-pay

## 2022-09-28 ENCOUNTER — Other Ambulatory Visit: Payer: Self-pay | Admitting: Emergency Medicine

## 2022-09-28 DIAGNOSIS — K2100 Gastro-esophageal reflux disease with esophagitis, without bleeding: Secondary | ICD-10-CM

## 2022-10-20 ENCOUNTER — Other Ambulatory Visit: Payer: Self-pay | Admitting: Cardiology

## 2023-02-05 ENCOUNTER — Other Ambulatory Visit: Payer: Self-pay | Admitting: Cardiology

## 2023-09-09 ENCOUNTER — Encounter: Payer: Self-pay | Admitting: Cardiology

## 2023-09-12 ENCOUNTER — Other Ambulatory Visit: Payer: Self-pay

## 2023-09-12 MED ORDER — ASPIRIN 81 MG PO TBEC
81.0000 mg | DELAYED_RELEASE_TABLET | Freq: Every day | ORAL | Status: AC
Start: 2023-09-12 — End: ?

## 2023-09-12 NOTE — Telephone Encounter (Signed)
Transition from Plavix 75 mg p.o. daily to aspirin 81 mg p.o. daily.    Eugene Mccullough [place], DO, [facility]

## 2023-09-15 NOTE — Telephone Encounter (Signed)
Sorry, it may have not been clear. Continue only Aspirin 81mg  po qday. Can hold off on Plavix.   Bassel Gaskill [place], DO, [facility]

## 2023-09-23 ENCOUNTER — Encounter: Payer: Self-pay | Admitting: Cardiology

## 2023-09-23 ENCOUNTER — Ambulatory Visit: Payer: No Typology Code available for payment source | Attending: Cardiology | Admitting: Cardiology

## 2023-09-23 ENCOUNTER — Other Ambulatory Visit: Payer: Self-pay

## 2023-09-23 VITALS — BP 134/72 | HR 75 | Ht 67.0 in | Wt 202.0 lb

## 2023-09-23 DIAGNOSIS — I25119 Atherosclerotic heart disease of native coronary artery with unspecified angina pectoris: Secondary | ICD-10-CM

## 2023-09-23 DIAGNOSIS — I10 Essential (primary) hypertension: Secondary | ICD-10-CM | POA: Diagnosis not present

## 2023-09-23 DIAGNOSIS — Z951 Presence of aortocoronary bypass graft: Secondary | ICD-10-CM | POA: Diagnosis not present

## 2023-09-23 DIAGNOSIS — I700 Atherosclerosis of aorta: Secondary | ICD-10-CM

## 2023-09-23 DIAGNOSIS — Z87891 Personal history of nicotine dependence: Secondary | ICD-10-CM

## 2023-09-23 DIAGNOSIS — E782 Mixed hyperlipidemia: Secondary | ICD-10-CM

## 2023-09-23 MED ORDER — EZETIMIBE 10 MG PO TABS: 10.0000 mg | ORAL_TABLET | Freq: Every day | ORAL | 3 refills | Status: DC

## 2023-09-23 NOTE — Progress Notes (Signed)
Orders placed for Zetia 10 mg once daily and fasting lipids in 6 weeks. Pt is aware.

## 2023-09-23 NOTE — Patient Instructions (Signed)
Medication Instructions:  Please discuss with Primary Care about changing amlodipine to an ARB due to diabetes.   *If you need a refill on your cardiac medications before your next appointment, please call your pharmacy*  Testing/Procedures: Echo Carotid Duplex   Your physician has requested that you have an echocardiogram. Echocardiography is a painless test that uses sound waves to create images of your heart. It provides your doctor with information about the size and shape of your heart and how well your heart's chambers and valves are working. This procedure takes approximately one hour. There are no restrictions for this procedure. Please do NOT wear cologne, perfume, aftershave, or lotions (deodorant is allowed). Please arrive 15 minutes prior to your appointment time.  Please note: We ask at that you not bring children with you during ultrasound (echo/ vascular) testing. Due to room size and safety concerns, children are not allowed in the ultrasound rooms during exams. Our front office staff cannot provide observation of children in our lobby area while testing is being conducted. An adult accompanying a patient to their appointment will only be allowed in the ultrasound room at the discretion of the ultrasound technician under special circumstances. We apologize for any inconvenience.   Your physician has requested that you have a carotid duplex. This test is an ultrasound of the carotid arteries in your neck. It looks at blood flow through these arteries that supply the brain with blood. Allow one hour for this exam. There are no restrictions or special instructions.   Follow-Up: At [facility], you and your health needs are our priority.  As part of our continuing mission to provide you with exceptional heart care, we have created designated Provider Care Teams.  These Care Teams include your primary Cardiologist (physician) and Advanced Practice Providers (APPs -  Physician  Assistants and Nurse Practitioners) who all work together to provide you with the care you need, when you need it.  Your next appointment:   1 year(s)  Provider:   Tessa Lerner, DO     Other Instructions   1st Floor: - Lobby - Registration  - Pharmacy  - Lab - Cafe  2nd Floor: - PV Lab - Diagnostic Testing (echo, CT, nuclear med)  3rd Floor: - Vacant  4th Floor: - TCTS (cardiothoracic surgery) - AFib Clinic - Structural Heart Clinic - Vascular Surgery  - Vascular Ultrasound  5th Floor: - [facility] Card[facility] (general and EP) - Clinical Pharmacy for coumadin, hypertension, lipid, weight-loss medications, and med management appointments    Valet parking services will be available as well.

## 2023-09-23 NOTE — Progress Notes (Signed)
Cardiology Office Note:  .   Date:  09/23/2023  ID:  Eugene Mccullough, DOB 1961-05-06, MRN [id number] PCP:  Chyrel Masson  Former Cardiology Providers: NA [facility] Providers Cardiologist:  Tessa Lerner, DO , [facility] (established care April 2022) Electrophysiologist:  None  Click to update primary MD,subspecialty MD or APP then REFRESH:1}    Chief Complaint  Patient presents with   Follow-up    CAD/history of CABG    History of Present Illness: .   Eugene Mccullough is a 63 y.o. African-American male whose past medical history and cardiovascular risk factors includes: Multivessel CAD status post four-vessel bypass surgery, HTN, HLD, BPH, prediabetes, mild coronary artery calcification, 26-pack-year history of smoking (quit in 2014), obesity due to excess calories.   Referred to the office for evaluation of chest pain in April 2022. He underwent coronary CTA which was concerning for obstructive CAD and underwent left heart catheterization. He had multivessel CAD and subsequently underwent four-vessel bypass with Dr. Cliffton Asters in May 2022. After bypass surgery he continued to complain of precordial discomfort which appeared to be noncardiac but due to recurrent office visits he did undergo a repeat MPI which was reported to be low risk. He was referred to GI for possible GERD. He was diagnosed with H. pylori and heartburn and after treatment his symptoms of precordial discomfort has resolved.   Patient presents today for 1.5-year follow-up given his history of CAD and status post CABG.  Since last office visit he denies anginal chest pain or heart failure symptoms.  No exercise program or daily routine.  Sugars have improved and currently in the prediabetic range.  LDL acceptable but not at goal.  Review of Systems: .   Review of Systems  HENT:  Positive for tinnitus.   Cardiovascular:  Negative for chest pain, claudication, irregular heartbeat, leg swelling, near-syncope,  orthopnea, palpitations, paroxysmal nocturnal dyspnea and syncope.  Respiratory:  Negative for shortness of breath.   Hematologic/Lymphatic: Negative for bleeding problem.    Studies Reviewed:   EKG: EKG Interpretation Date/Time:  Monday September 23 2023 16:34:37 EST Ventricular Rate:  67 PR Interval:  180 QRS Duration:  120 QT Interval:  400 QTC Calculation: 422 R Axis:   59  Text Interpretation: Normal sinus rhythm Non-specific intra-ventricular conduction delay Nonspecific T wave abnormality When compared with ECG of 10-Jan-2021 14:22, Vent. rate has decreased BY  33 BPM QRS duration has increased QT has shortened Confirmed by Tessa Lerner [phone]) on 09/23/2023 4:46:16 PM  Echocardiogram: 12/13/2020: Normal LV systolic function with visual EF 50-55%. Left ventricle cavity is normal in size. Mild left ventricular hypertrophy. Normal global wall motion. Indeterminate diastolic filling pattern, normal LAP. Left atrial cavity is severely dilated. Mild (Grade I) mitral regurgitation. Mild tricuspid regurgitation. No evidence of pulmonary hypertension. Mild pulmonic regurgitation. IVC is dilated with a respiratory response of >50%. No prior study for comparison.  Coronary CTA 12/14/2020: 1. Coronary calcium score of 24.6 AU. This was 70th percentile for age and sex matched control. 2. Normal coronary origin with right dominance. 3. CAD-RADS = 4. Severe stenosis (70-99%) at the proximal /mid LAD due to mixed plaque. First diagonal branch patent but diffuse disease within the ostial to proximal segment. Second diagonal branch is overall patent. Diffuse disease within the proximal to mid segment LCx. RCA without evidence of plaque or stenosis. 4. Study is sent for CT-FFR to further evaluate the LAD, D1, and LCx. Findings will be performed and reported separately.  Noncardiac impressions: 1. No acute findings in the imaged extracardiac chest. 2. Moderate hiatal hernia. 3. Mild  hepatic steatosis. 4. Aortic Atherosclerosis (ICD10-I70.0).   Coronary CT FFR 12/15/2020: CT FFR analysis showed significant stenosis at mid LAD (modeled as total occlusion), mid-to-distal LCX, and acute marginal modeled as total occlusion.   Heart Catheterization: 12/21/2020: LM: Normal LAD: Mid 100% occlusion after Diag 2         Collaterals to distal LAD from Diag 2 and RPDA         High Diag 1 patent         Diag 2 diffuse 60-70% disease LCx: Proximal tandem 75% stenosis        Undefilled and sub-totally occluded OM1        Mid LCx 7% stenosis RCA: Mild luminal irregularities   Attempted wiring of LAD to assess chronicity of LAD occlusion. Based on wire behavior, appears to be chronic   Continue optimal medical management for now. Fortunately, he does not have any LM involvement and EF is normal. Any revascularization recommendation will be for symptomatic improvement.  In addition to optimal medical therapy, following are the options for revascularization   #1. CABG to distal LAD, ?diag2, OM1-if graftable #2. PCI to LCx +/- OM1, stage LAD CTO PCI   CABG more likely to achieve complete revascularization with less procedures. Will continue outpatient discussion re: optimal revascularization  RADIOLOGY: NA  Risk Assessment/Calculations:   N/A   Labs:       Latest Ref Rng & Units 08/24/2022    2:24 PM 03/29/2022    1:19 PM 11/26/2021   10:24 AM  CBC  WBC 3.4 - 10.8 x10E3/uL 5.3  5.4  5.6   Hemoglobin 13.0 - 17.7 g/dL 82.9  56.2  13.0   Hematocrit 37.5 - 51.0 % 40.6  40.5  42.9   Platelets 150 - 450 x10E3/uL 136  115.0  158        Latest Ref Rng & Units 08/24/2022    2:24 PM 03/29/2022    1:19 PM 01/30/2022   10:31 AM  BMP  Glucose 70 - 99 mg/dL 865  784  696   BUN 8 - 27 mg/dL 19  18  15    Creatinine 0.76 - 1.27 mg/dL 2.95  2.84  1.32   BUN/Creat Ratio 10 - 24 16     Sodium 134 - 144 mmol/L 139  135  137   Potassium 3.5 - 5.2 mmol/L 4.6  4.6  4.5   Chloride 96 -  106 mmol/L 103  102  103   CO2 20 - 29 mmol/L 19  28  27    Calcium 8.6 - 10.2 mg/dL 44.0  9.7  9.7       Latest Ref Rng & Units 08/24/2022    2:24 PM 03/29/2022    1:19 PM 01/30/2022   10:31 AM  CMP  Glucose 70 - 99 mg/dL 102  725  366   BUN 8 - 27 mg/dL 19  18  15    Creatinine 0.76 - 1.27 mg/dL 4.40  3.47  4.25   Sodium 134 - 144 mmol/L 139  135  137   Potassium 3.5 - 5.2 mmol/L 4.6  4.6  4.5   Chloride 96 - 106 mmol/L 103  102  103   CO2 20 - 29 mmol/L 19  28  27    Calcium 8.6 - 10.2 mg/dL 95.6  9.7  9.7   Total Protein 6.0 - 8.5 g/dL 8.5  7.9  7.9   Total Bilirubin 0.0 - 1.2 mg/dL 0.4  0.5  0.5   Alkaline Phos 44 - 121 IU/L 81  66  69   AST 0 - 40 IU/L 19  19  15    ALT 0 - 44 IU/L 21  16  13      Lab Results  Component Value Date   CHOL 132 03/29/2022   HDL 35.70 (L) 03/29/2022   LDLCALC 59 03/29/2022   LDLDIRECT 52 02/21/2021   TRIG 184.0 (H) 03/29/2022   CHOLHDL 4 03/29/2022   No results for input(s): "LIPOA" in the last 8760 hours. No components found for: "NTPROBNP" No results for input(s): "PROBNP" in the last 8760 hours. No results for input(s): "TSH" in the last 8760 hours.  External Labs: Collected: May 22, 2023, [facility] everywhere. Total cholesterol 134, triglycerides 136, HDL 33, LDL 78, non-HDL 101 Potassium 4.9 Creatinine 1.47. eGFR 54 A1c 5.7  Collected August 19, 2023 [facility], [facility] Everywhere. Sodium 136, potassium 4.4, chloride 103, bicarb 29. BUN 18, creatinine 1.24. eGFR 66.   Physical Exam:    Today's Vitals   09/23/23 1631  BP: 134/72  Pulse: 75  SpO2: 98%  Weight: 202 lb (91.6 kg)  Height: 5\' 7"  (1.702 m)   Body mass index is 31.64 kg/m. Wt Readings from Last 3 Encounters:  09/23/23 202 lb (91.6 kg)  07/04/22 209 lb 6 oz (95 kg)  04/05/22 195 lb (88.5 kg)    Physical Exam  Constitutional: No distress.  hemodynamically stable  Neck: No JVD present.  Cardiovascular: Normal rate, regular rhythm, S1  normal and S2 normal. Exam reveals no gallop, no S3 and no S4.  No murmur heard. Pulses:      Dorsalis pedis pulses are 2+ on the right side and 2+ on the left side.       Posterior tibial pulses are 2+ on the right side and 2+ on the left side.  Pulmonary/Chest: Effort normal and breath sounds normal. No stridor. He has no wheezes. He has no rales.  Abdominal: Soft. Bowel sounds are normal. He exhibits no distension. There is no abdominal tenderness.  Musculoskeletal:        General: No edema.     Cervical back: Neck supple.  Neurological: He is alert and oriented to person, place, and time. He has intact cranial nerves (2-12).  Skin: Skin is warm.     Impression & Recommendation(s):  Impression:   ICD-10-CM   1. Coronary artery disease involving native coronary artery of native heart with angina pectoris (HCC)  I25.119 EKG 12-Lead    ECHOCARDIOGRAM COMPLETE    VAS US CAROTID    2. Hx of CABG  Z95.1     3. Atherosclerosis of aorta (HCC)  I70.0     4. Essential hypertension  I10     5. Mixed hyperlipidemia  E78.2     6. Former smoker  Z87.891        Recommendation(s):  Coronary artery disease involving native coronary artery of native heart with angina pectoris (HCC) Hx of CABG Atherosclerosis of aorta (HCC) S/p four-vessel bypass 01/10/2021 and mediastinal exploration 01/11/2021.  Denies anginal chest pain. No use of sublingual nitroglycerin tablets since the last office visit. EKG nonischemic Earlier this month transitioned him from dual antiplatelet therapy to monotherapy with aspirin 81 mg p.o. daily. Echo will be ordered to evaluate for structural heart disease and left ventricular systolic function. Outside labs from September 2024 independently reviewed-Care  Everywhere.  LDL not at goal. Currently on atorvastatin 40 mg p.o. daily.  Will add Zetia 10 mg p.o. daily, recommend a goal LDL <55 mg/dL. Educated him on the importance of improving his modifiable  cardiovascular risk factors. Encouraged him to start walking at least 30 minutes a day 5 days a week with an average goal of 150 minutes of moderate intensity exercise as tolerated.  Essential hypertension Office blood pressures are well-controlled. Patient sways in and out of the prediabetic range-consider transitioning him from amlodipine to ARB for blood pressure and renal protection.  Will defer to PCP. Reemphasized importance of low-salt diet.  Mixed hyperlipidemia Currently on atorvastatin 40 mg p.o. daily.   Add Zetia 10 mg p.o. daily He denies myalgia or other side effects. Most recent lipids dated September 2024, independently reviewed as noted above.  LDL is 78 mg/dL.  Cardiology is following peripherally.  Patient states that he has been having ringing in his ears for many years, has followed up with ENT and no additional workup or recommendations provided.  But he was recommended to have carotid duplex given his cardiovascular history.  I reviewed the last carotid duplex from 2022 prior to his CABG surgery and he had minimal to no atherosclerotic burden.  Since his risk factors continue and given his history of CAD status post CABG we will proceed forward with a carotid duplex for further evaluation.  Orders Placed:  Orders Placed This Encounter  Procedures   EKG 12-Lead   ECHOCARDIOGRAM COMPLETE    Standing Status:   Future    Expiration Date:   09/22/2024    Where should this test be performed:   [facility])    Does the patient weigh less than or greater than 250 lbs?:   Patient weighs less than 250 lbs    Perflutren DEFINITY (image enhancing agent) should be administered unless hypersensitivity or allergy exist:   Administer Perflutren    Reason for exam-Echo:   Other-Full Diagnosis List    Full ICD-10/Reason for Exam:   CAD (coronary artery disease) [161096]    Discussed management of at least 2 chronic comorbid conditions, outside labs from  September and December 2024 reviewed via Care Everywhere, EKG ordered and independently reviewed, discussed management of CAD and risk factors, ordered additional diagnostic workup including echo and carotid duplex, addition of Zetia and follow-up labs.  Final Medication List:   No orders of the defined types were placed in this encounter.   There are no discontinued medications.   Current Outpatient Medications:    amLODipine (NORVASC) 5 MG tablet, TAKE 1 TABLET (5 MG TOTAL) BY MOUTH DAILY., Disp: 90 tablet, Rfl: 1   aspirin EC 81 MG tablet, Take 1 tablet (81 mg total) by mouth daily. Swallow whole., Disp: , Rfl:    atorvastatin (LIPITOR) 40 MG tablet, TAKE 1 TABLET BY MOUTH EVERYDAY AT BEDTIME, Disp: 90 tablet, Rfl: 1   desonide (DESOWEN) 0.05 % ointment, Apply topically 2 (two) times daily as needed., Disp: 15 g, Rfl: 0   famotidine (PEPCID) 40 MG tablet, TAKE 1 TABLET BY MOUTH EVERYDAY AT BEDTIME, Disp: 30 tablet, Rfl: 2   gabapentin (NEURONTIN) 100 MG capsule, Take 1 capsule (100 mg total) by mouth 2 (two) times daily., Disp: 180 capsule, Rfl: 1   metoprolol succinate (TOPROL-XL) 25 MG 24 hr tablet, TAKE 1 TABLET BY MOUTH EVERY DAY IN THE MORNING, Disp: 30 tablet, Rfl: 2   nitroGLYCERIN (NITROSTAT) 0.4 MG SL tablet, PLACE  1 TABLET UNDER THE TONGUE EVERY 5 MIN AS NEEDED FOR CHEST PAIN -CALL 911 ON 2ND TAB MAX 3 TAB-, Disp: , Rfl:    ondansetron (ZOFRAN) 4 MG tablet, Take 1 tablet (4 mg total) by mouth every 8 (eight) hours as needed for nausea or vomiting., Disp: 20 tablet, Rfl: 0   pantoprazole (PROTONIX) 40 MG tablet, TAKE 1 TABLET BY MOUTH TWICE A DAY, Disp: 30 tablet, Rfl: 3   tamsulosin (FLOMAX) 0.4 MG CAPS capsule, Take 1 capsule (0.4 mg total) by mouth daily., Disp: 30 capsule, Rfl: 11   tretinoin (RETIN-A) 0.025 % cream, SMARTSIG:sparingly Topical Every Night, Disp: , Rfl:    ezetimibe (ZETIA) 10 MG tablet, Take 1 tablet (10 mg total) by mouth daily., Disp: 90 tablet, Rfl:  3  Current Facility-Administered Medications:    0.9 %  sodium chloride infusion, 500 mL, Intravenous, Once, Rachael Fee, MD  Consent:   NA  Disposition:   1 year follow-up sooner if needed  His questions and concerns were addressed to his satisfaction. He voices understanding of the recommendations provided during this encounter.    Signed, Tessa Lerner, DO, [facility]  [facility]  [address] [place], [place] [address] 09/23/2023 7:23 PM

## 2023-10-04 ENCOUNTER — Encounter: Payer: Self-pay | Admitting: Cardiology

## 2023-10-04 LAB — LIPID PANEL
Chol/HDL Ratio: 2.7 {ratio} (ref 0.0–5.0)
Cholesterol, Total: 101 mg/dL (ref 100–199)
HDL: 37 mg/dL — ABNORMAL LOW (ref >39)
LDL Chol Calc (NIH): 47 mg/dL (ref 0–99)
Triglycerides: 86 mg/dL (ref 0–149)
VLDL Cholesterol Cal: 17 mg/dL (ref 5–40)

## 2023-10-04 LAB — COMPREHENSIVE METABOLIC PANEL
ALT: 26 [IU]/L (ref 0–44)
AST: 19 [IU]/L (ref 0–40)
Albumin: 4.5 g/dL (ref 3.9–4.9)
Alkaline Phosphatase: 74 [IU]/L (ref 44–121)
BUN/Creatinine Ratio: 11 (ref 10–24)
BUN: 13 mg/dL (ref 8–27)
Bilirubin Total: 0.6 mg/dL (ref 0.0–1.2)
CO2: 25 mmol/L (ref 20–29)
Calcium: 10 mg/dL (ref 8.6–10.2)
Chloride: 104 mmol/L (ref 96–106)
Creatinine, Ser: 1.14 mg/dL (ref 0.76–1.27)
Globulin, Total: 3.3 g/dL (ref 1.5–4.5)
Glucose: 83 mg/dL (ref 70–99)
Potassium: 4.5 mmol/L (ref 3.5–5.2)
Sodium: 141 mmol/L (ref 134–144)
Total Protein: 7.8 g/dL (ref 6.0–8.5)
eGFR: 72 mL/min/{1.73_m2} (ref >59)

## 2023-10-04 LAB — LDL CHOLESTEROL, DIRECT: LDL Direct: 51 mg/dL (ref 0–99)

## 2023-10-13 ENCOUNTER — Other Ambulatory Visit: Payer: Self-pay | Admitting: Cardiology

## 2023-10-21 ENCOUNTER — Encounter: Payer: Self-pay | Admitting: Cardiology

## 2023-10-21 NOTE — Telephone Encounter (Signed)
 Aspirin 81 mg p.o. daily should not cause tinnitus.  I will defer to ENT for further evaluation and management.  Lebert Lovern [place], DO, [facility]

## 2023-10-29 ENCOUNTER — Ambulatory Visit (HOSPITAL_COMMUNITY)
Admission: RE | Admit: 2023-10-29 | Discharge: 2023-10-29 | Disposition: A | Discharge status: Home / Self Care | Payer: No Typology Code available for payment source | Source: Ambulatory Visit | Attending: Cardiovascular Disease | Admitting: Cardiovascular Disease

## 2023-10-29 ENCOUNTER — Ambulatory Visit (HOSPITAL_BASED_OUTPATIENT_CLINIC_OR_DEPARTMENT_OTHER)
Admission: RE | Admit: 2023-10-29 | Discharge: 2023-10-29 | Disposition: A | Discharge status: Home / Self Care | Payer: No Typology Code available for payment source | Source: Ambulatory Visit | Attending: Cardiology | Admitting: Cardiology

## 2023-10-29 DIAGNOSIS — H93A1 Pulsatile tinnitus, right ear: Secondary | ICD-10-CM | POA: Diagnosis not present

## 2023-10-29 DIAGNOSIS — I25119 Atherosclerotic heart disease of native coronary artery with unspecified angina pectoris: Secondary | ICD-10-CM | POA: Diagnosis present

## 2023-10-29 LAB — ECHOCARDIOGRAM COMPLETE
AR max vel: 2.04 cm2
AV Area VTI: 2.29 cm2
AV Area mean vel: 2.4 cm2
AV Mean grad: 2 mmHg
AV Peak grad: 5 mmHg
Ao pk vel: 1.12 m/s
Area-P 1/2: 3.19 cm2
MV M vel: 4.88 m/s
MV Peak grad: 95.3 mmHg
S' Lateral: 2.81 cm

## 2023-10-30 ENCOUNTER — Encounter: Payer: Self-pay | Admitting: Cardiology

## 2023-10-30 NOTE — Telephone Encounter (Signed)
 Eugene Mccullough,   Please call Mr. Eugene Mccullough and get a better understanding of the situation / concerns.   Vilma Will [place], DO, [facility]

## 2023-11-01 ENCOUNTER — Telehealth: Payer: Self-pay

## 2023-11-01 NOTE — Telephone Encounter (Signed)
 Attempted to call pt to discuss MyChart message concern regarding carotid duplex study. LMTCB.

## 2023-11-04 ENCOUNTER — Encounter: Payer: Self-pay | Admitting: Cardiology

## 2023-11-05 ENCOUNTER — Ambulatory Visit: Payer: Commercial Managed Care - HMO | Admitting: Cardiology

## 2023-11-07 NOTE — Telephone Encounter (Signed)
 I spoke to the patient over the phone personally.   It appears that he had a stress test in 2018 which was reported to be normal and 3 years later he started to see me.  In the workup that I initiated he was noted to have multivessel CAD.  And that is why he thinks that his CAD was missed in 2018 and questions the results of the carotid duplex.  1.  I reviewed the peak systolic velocities and images and study does not illustrate any significant obstructive disease.  This was conveyed to the patient and provided reassurance.  2.  For his satisfaction I recommended repeating the study in 1 year to make sure that there is no significant delta change between the 2 studies.  3.  Patient is more interested in repeating the study this year.  Recommended sending in another prescription for carotid duplex but unfortunately they will likely be done at the same facility due to organizational structure of where the carotid duplex are performed.  4.  Talking to PCP and sending [facility] a referral outside [facility] for reevaluation.  Patient states that he will discuss with PCP.  He is more than welcome to call us back if he has any questions or concerns.  Evamaria Detore [place], DO, [facility]

## 2024-05-15 ENCOUNTER — Encounter: Payer: Self-pay | Admitting: Cardiology

## 2024-06-19 ENCOUNTER — Encounter: Payer: Self-pay | Admitting: Cardiology

## 2024-06-19 ENCOUNTER — Other Ambulatory Visit: Payer: Self-pay | Admitting: Cardiology

## 2024-06-19 DIAGNOSIS — E782 Mixed hyperlipidemia: Secondary | ICD-10-CM

## 2024-06-19 MED ORDER — EZETIMIBE 10 MG PO TABS: 10.0000 mg | ORAL_TABLET | Freq: Every day | ORAL | 1 refills | Status: AC

## 2024-08-05 ENCOUNTER — Encounter: Payer: Self-pay | Admitting: Cardiology

## 2024-12-15 ENCOUNTER — Ambulatory Visit: Admitting: Dermatology
